# Patient Record
Sex: Female | Born: 1950 | Race: White | Hispanic: No | Marital: Married | Smoking: Never smoker
Health system: Southern US, Community
[De-identification: ages and names within clinical notes are randomized; demographics above are authoritative.]

## PROBLEM LIST (undated history)

## (undated) DIAGNOSIS — N814 Uterovaginal prolapse, unspecified: Secondary | ICD-10-CM

## (undated) DIAGNOSIS — M1990 Unspecified osteoarthritis, unspecified site: Secondary | ICD-10-CM

## (undated) DIAGNOSIS — Z973 Presence of spectacles and contact lenses: Secondary | ICD-10-CM

## (undated) DIAGNOSIS — R413 Other amnesia: Secondary | ICD-10-CM

## (undated) DIAGNOSIS — N393 Stress incontinence (female) (male): Secondary | ICD-10-CM

## (undated) DIAGNOSIS — I10 Essential (primary) hypertension: Secondary | ICD-10-CM

## (undated) DIAGNOSIS — I8392 Asymptomatic varicose veins of left lower extremity: Secondary | ICD-10-CM

## (undated) DIAGNOSIS — E039 Hypothyroidism, unspecified: Secondary | ICD-10-CM

## (undated) DIAGNOSIS — E785 Hyperlipidemia, unspecified: Secondary | ICD-10-CM

## (undated) DIAGNOSIS — R42 Dizziness and giddiness: Secondary | ICD-10-CM

## (undated) DIAGNOSIS — G4730 Sleep apnea, unspecified: Secondary | ICD-10-CM

## (undated) HISTORY — DX: Hyperlipidemia, unspecified: E78.5

## (undated) HISTORY — PX: VARICOSE VEIN SURGERY: SHX832

## (undated) HISTORY — PX: CARPAL TUNNEL RELEASE: SHX101

## (undated) HISTORY — PX: LACRIMAL DUCT PROBING W/ DACRYOPLASTY: SHX1904

## (undated) HISTORY — PX: EYE SURGERY: SHX253

## (undated) HISTORY — PX: ABDOMINAL HYSTERECTOMY: SHX81

## (undated) HISTORY — DX: Other amnesia: R41.3

## (undated) HISTORY — DX: Asymptomatic varicose veins of left lower extremity: I83.92

## (undated) HISTORY — PX: THYROIDECTOMY: SHX17

---

## 1999-01-31 HISTORY — PX: CHOLECYSTECTOMY: SHX55

## 2004-01-14 ENCOUNTER — Ambulatory Visit: Payer: Self-pay | Admitting: Orthopaedic Surgery

## 2004-05-05 ENCOUNTER — Ambulatory Visit: Payer: Self-pay | Admitting: Ophthalmology

## 2004-07-07 ENCOUNTER — Ambulatory Visit: Payer: Self-pay

## 2004-12-11 ENCOUNTER — Other Ambulatory Visit: Payer: Self-pay

## 2004-12-11 ENCOUNTER — Emergency Department: Payer: Self-pay | Admitting: Emergency Medicine

## 2005-12-13 ENCOUNTER — Ambulatory Visit: Payer: Self-pay | Admitting: Orthopaedic Surgery

## 2006-08-22 ENCOUNTER — Emergency Department: Payer: Self-pay | Admitting: Emergency Medicine

## 2006-10-05 ENCOUNTER — Ambulatory Visit: Payer: Self-pay | Admitting: Unknown Physician Specialty

## 2006-11-28 ENCOUNTER — Ambulatory Visit: Payer: Self-pay

## 2007-07-02 ENCOUNTER — Emergency Department: Payer: Self-pay | Admitting: Emergency Medicine

## 2007-10-02 ENCOUNTER — Ambulatory Visit: Payer: Self-pay | Admitting: Internal Medicine

## 2007-10-08 ENCOUNTER — Ambulatory Visit: Payer: Self-pay

## 2007-10-31 ENCOUNTER — Ambulatory Visit: Payer: Self-pay | Admitting: Internal Medicine

## 2007-11-08 ENCOUNTER — Ambulatory Visit: Payer: Self-pay | Admitting: General Practice

## 2007-11-20 ENCOUNTER — Ambulatory Visit: Payer: Self-pay | Admitting: General Practice

## 2008-08-02 ENCOUNTER — Emergency Department: Payer: Self-pay | Admitting: Emergency Medicine

## 2008-11-26 ENCOUNTER — Other Ambulatory Visit: Payer: Self-pay | Admitting: Otolaryngology

## 2008-12-15 ENCOUNTER — Ambulatory Visit: Payer: Self-pay | Admitting: Otolaryngology

## 2009-01-18 ENCOUNTER — Ambulatory Visit: Payer: Self-pay | Admitting: Otolaryngology

## 2009-04-05 ENCOUNTER — Ambulatory Visit: Payer: Self-pay | Admitting: Obstetrics and Gynecology

## 2009-10-21 ENCOUNTER — Emergency Department: Payer: Self-pay | Admitting: Emergency Medicine

## 2010-07-01 ENCOUNTER — Other Ambulatory Visit: Payer: Self-pay | Admitting: Obstetrics and Gynecology

## 2010-07-01 ENCOUNTER — Other Ambulatory Visit: Payer: Self-pay | Admitting: Otolaryngology

## 2010-07-04 ENCOUNTER — Ambulatory Visit: Payer: Self-pay | Admitting: Obstetrics and Gynecology

## 2010-11-14 ENCOUNTER — Ambulatory Visit: Payer: Self-pay

## 2011-04-24 ENCOUNTER — Ambulatory Visit: Payer: Self-pay | Admitting: Unknown Physician Specialty

## 2011-04-25 LAB — PATHOLOGY REPORT

## 2011-08-14 ENCOUNTER — Ambulatory Visit: Payer: Self-pay | Admitting: Obstetrics and Gynecology

## 2013-12-11 DIAGNOSIS — E039 Hypothyroidism, unspecified: Secondary | ICD-10-CM | POA: Insufficient documentation

## 2013-12-11 DIAGNOSIS — E7800 Pure hypercholesterolemia, unspecified: Secondary | ICD-10-CM | POA: Insufficient documentation

## 2013-12-11 DIAGNOSIS — I10 Essential (primary) hypertension: Secondary | ICD-10-CM | POA: Insufficient documentation

## 2013-12-11 DIAGNOSIS — Z8659 Personal history of other mental and behavioral disorders: Secondary | ICD-10-CM | POA: Insufficient documentation

## 2013-12-11 DIAGNOSIS — G4730 Sleep apnea, unspecified: Secondary | ICD-10-CM | POA: Insufficient documentation

## 2013-12-11 DIAGNOSIS — T7840XA Allergy, unspecified, initial encounter: Secondary | ICD-10-CM | POA: Insufficient documentation

## 2014-05-08 ENCOUNTER — Encounter: Payer: Self-pay | Admitting: *Deleted

## 2014-07-23 ENCOUNTER — Other Ambulatory Visit: Payer: Self-pay | Admitting: Orthopedic Surgery

## 2014-07-23 DIAGNOSIS — M25511 Pain in right shoulder: Secondary | ICD-10-CM

## 2014-07-29 ENCOUNTER — Ambulatory Visit: Payer: 59

## 2014-08-05 ENCOUNTER — Ambulatory Visit
Admission: RE | Admit: 2014-08-05 | Discharge: 2014-08-05 | Disposition: A | Discharge status: Home / Self Care | Payer: 59 | Source: Ambulatory Visit | Attending: Orthopedic Surgery | Admitting: Orthopedic Surgery

## 2014-08-05 ENCOUNTER — Ambulatory Visit: Payer: Self-pay

## 2014-08-05 DIAGNOSIS — M66821 Spontaneous rupture of other tendons, right upper arm: Secondary | ICD-10-CM | POA: Insufficient documentation

## 2014-08-05 DIAGNOSIS — M778 Other enthesopathies, not elsewhere classified: Secondary | ICD-10-CM | POA: Diagnosis not present

## 2014-08-05 DIAGNOSIS — M25511 Pain in right shoulder: Secondary | ICD-10-CM | POA: Diagnosis present

## 2014-08-05 MED ORDER — IOHEXOL 300 MG/ML  SOLN: 50.0000 mL | Freq: Once | INTRAMUSCULAR | Status: AC | PRN

## 2014-08-05 MED ORDER — GADOBENATE DIMEGLUMINE 529 MG/ML IV SOLN: 10.0000 mL | Freq: Once | INTRAVENOUS | Status: AC | PRN

## 2014-08-25 ENCOUNTER — Encounter
Admission: RE | Admit: 2014-08-25 | Discharge: 2014-08-25 | Disposition: A | Discharge status: Home / Self Care | Payer: 59 | Source: Ambulatory Visit | Attending: Orthopedic Surgery | Admitting: Orthopedic Surgery

## 2014-08-25 DIAGNOSIS — Z01812 Encounter for preprocedural laboratory examination: Secondary | ICD-10-CM | POA: Diagnosis present

## 2014-08-25 DIAGNOSIS — Z01810 Encounter for preprocedural cardiovascular examination: Secondary | ICD-10-CM | POA: Insufficient documentation

## 2014-08-25 HISTORY — DX: Hypothyroidism, unspecified: E03.9

## 2014-08-25 HISTORY — DX: Dizziness and giddiness: R42

## 2014-08-25 HISTORY — DX: Unspecified osteoarthritis, unspecified site: M19.90

## 2014-08-25 HISTORY — DX: Sleep apnea, unspecified: G47.30

## 2014-08-25 HISTORY — DX: Essential (primary) hypertension: I10

## 2014-08-25 LAB — URINALYSIS COMPLETE WITH MICROSCOPIC (ARMC ONLY)
BILIRUBIN URINE: NEGATIVE
Bacteria, UA: NONE SEEN
Glucose, UA: NEGATIVE mg/dL
HGB URINE DIPSTICK: NEGATIVE
Leukocytes, UA: NEGATIVE
Nitrite: NEGATIVE
Protein, ur: NEGATIVE mg/dL
SPECIFIC GRAVITY, URINE: 1.021 (ref 1.005–1.030)
pH: 5 (ref 5.0–8.0)

## 2014-08-25 LAB — BASIC METABOLIC PANEL
Anion gap: 10 (ref 5–15)
BUN: 21 mg/dL — ABNORMAL HIGH (ref 6–20)
CALCIUM: 9.4 mg/dL (ref 8.9–10.3)
CO2: 28 mmol/L (ref 22–32)
Chloride: 99 mmol/L — ABNORMAL LOW (ref 101–111)
Creatinine, Ser: 0.85 mg/dL (ref 0.44–1.00)
GFR calc non Af Amer: >60 mL/min (ref >60)
GLUCOSE: 100 mg/dL — AB (ref 65–99)
Potassium: 3.8 mmol/L (ref 3.5–5.1)
Sodium: 137 mmol/L (ref 135–145)

## 2014-08-25 LAB — CBC
HCT: 35.4 % (ref 35.0–47.0)
HEMOGLOBIN: 12.1 g/dL (ref 12.0–16.0)
MCH: 32.1 pg (ref 26.0–34.0)
MCHC: 34.2 g/dL (ref 32.0–36.0)
MCV: 93.6 fL (ref 80.0–100.0)
PLATELETS: 257 10*3/uL (ref 150–440)
RBC: 3.78 MIL/uL — ABNORMAL LOW (ref 3.80–5.20)
RDW: 13.7 % (ref 11.5–14.5)
WBC: 7.7 10*3/uL (ref 3.6–11.0)

## 2014-08-25 LAB — PROTIME-INR
INR: 0.96
PROTHROMBIN TIME: 13 s (ref 11.4–15.0)

## 2014-08-25 LAB — APTT: aPTT: 29 seconds (ref 24–36)

## 2014-08-25 NOTE — Pre-Procedure Instructions (Signed)
EKG sent to anesthesia for review

## 2014-08-25 NOTE — Patient Instructions (Signed)
  Your procedure is scheduled on: September 03, 2014 (Thursday) Report to Day Surgery. To find out your arrival time please call [phone] between 1PM - 3PM on September 02 2014 (Wednesday).  Remember: Instructions that are not followed completely may result in serious medical risk, up to and including death, or upon the discretion of your surgeon and anesthesiologist your surgery may need to be rescheduled.    __x__ 1. Do not eat food or drink liquids after midnight. No gum chewing or hard candies.     __x__ 2. No Alcohol for 24 hours before or after surgery.   ____ 3. Bring all medications with you on the day of surgery if instructed.    __x__ 4. Notify your doctor if there is any change in your medical condition     (cold, fever, infections).     Do not wear jewelry, make-up, hairpins, clips or nail polish.  Do not wear lotions, powders, or perfumes. You may wear deodorant.  Do not shave 48 hours prior to surgery. Men may shave face and neck.  Do not bring valuables to the hospital.    [facility] is not responsible for any belongings or valuables.               Contacts, dentures or bridgework may not be worn into surgery.  Leave your suitcase in the car. After surgery it may be brought to your room.  For patients admitted to the hospital, discharge time is determined by your                treatment team.   Patients discharged the day of surgery will not be allowed to drive home.   Please read over the following fact sheets that you were given:   Surgical Site Infection Prevention   ____ Take these medicines the morning of surgery with A SIP OF WATER:    1. Synthroid  2.   3.   4.  5.  6.  ____ Fleet Enema (as directed)   __x__ Use CHG Soap as directed  ____ Use inhalers on the day of surgery  ____ Stop metformin 2 days prior to surgery    ____ Take 1/2 of usual insulin dose the night before surgery and none on the morning of surgery.   ____ Stop  Coumadin/Plavix/aspirin on   __x__ Stop Anti-inflammatories on (Stop Meloxicam 7-10 days prior to surgery)   ____ Stop supplements until after surgery.    __x__ Bring C-Pap to the hospital.

## 2014-08-26 NOTE — Pre-Procedure Instructions (Signed)
EKG reviewed by Dr. Marcello Moores and "ok for surgery"

## 2014-09-03 ENCOUNTER — Ambulatory Visit: Payer: 59 | Admitting: Anesthesiology

## 2014-09-03 ENCOUNTER — Ambulatory Visit
Admission: RE | Admit: 2014-09-03 | Discharge: 2014-09-03 | Disposition: A | Discharge status: Home / Self Care | Payer: 59 | Source: Ambulatory Visit | Attending: Orthopedic Surgery | Admitting: Orthopedic Surgery

## 2014-09-03 ENCOUNTER — Encounter: Payer: Self-pay | Admitting: *Deleted

## 2014-09-03 ENCOUNTER — Encounter
Admission: RE | Disposition: A | Discharge status: Home / Self Care | Payer: Self-pay | Source: Ambulatory Visit | Attending: Orthopedic Surgery

## 2014-09-03 DIAGNOSIS — M75111 Incomplete rotator cuff tear or rupture of right shoulder, not specified as traumatic: Secondary | ICD-10-CM | POA: Diagnosis present

## 2014-09-03 DIAGNOSIS — I10 Essential (primary) hypertension: Secondary | ICD-10-CM | POA: Insufficient documentation

## 2014-09-03 DIAGNOSIS — G4730 Sleep apnea, unspecified: Secondary | ICD-10-CM | POA: Insufficient documentation

## 2014-09-03 DIAGNOSIS — E039 Hypothyroidism, unspecified: Secondary | ICD-10-CM | POA: Diagnosis not present

## 2014-09-03 HISTORY — PX: SHOULDER ARTHROSCOPY WITH OPEN ROTATOR CUFF REPAIR: SHX6092

## 2014-09-03 SURGERY — ARTHROSCOPY, SHOULDER WITH REPAIR, ROTATOR CUFF, OPEN
Anesthesia: General | Laterality: Right

## 2014-09-03 MED ORDER — ALUM & MAG HYDROXIDE-SIMETH 200-200-20 MG/5ML PO SUSP: 30.0000 mL | ORAL | Status: DC | PRN

## 2014-09-03 MED ORDER — DOCUSATE SODIUM 100 MG PO CAPS: 100.0000 mg | ORAL_CAPSULE | Freq: Two times a day (BID) | ORAL | Status: DC

## 2014-09-03 MED ORDER — FAMOTIDINE 20 MG PO TABS
ORAL_TABLET | ORAL | Status: AC
  Administered 2014-09-03: 20 mg via ORAL
  Filled 2014-09-03: qty 1

## 2014-09-03 MED ORDER — OXYCODONE HCL 5 MG PO TABS: 5.0000 mg | ORAL_TABLET | ORAL | Status: DC | PRN

## 2014-09-03 MED ORDER — DEXAMETHASONE SODIUM PHOSPHATE 10 MG/ML IJ SOLN
INTRAMUSCULAR | Status: DC | PRN
  Administered 2014-09-03: 10 mg via INTRAVENOUS

## 2014-09-03 MED ORDER — BUPIVACAINE HCL (PF) 0.25 % IJ SOLN
INTRAMUSCULAR | Status: AC
  Filled 2014-09-03: qty 30

## 2014-09-03 MED ORDER — MAGNESIUM CITRATE PO SOLN: 1.0000 | Freq: Once | ORAL | Status: DC | PRN

## 2014-09-03 MED ORDER — HYDROMORPHONE HCL 1 MG/ML IJ SOLN: 0.2500 mg | INTRAMUSCULAR | Status: DC | PRN

## 2014-09-03 MED ORDER — OXYCODONE HCL 5 MG PO TABS
5.0000 mg | ORAL_TABLET | ORAL | Status: DC | PRN
Start: 2014-09-03 — End: 2015-12-09

## 2014-09-03 MED ORDER — FENTANYL CITRATE (PF) 100 MCG/2ML IJ SOLN: 25.0000 ug | INTRAMUSCULAR | Status: DC | PRN

## 2014-09-03 MED ORDER — ACETAMINOPHEN 10 MG/ML IV SOLN
INTRAVENOUS | Status: AC
  Filled 2014-09-03: qty 100

## 2014-09-03 MED ORDER — POTASSIUM CHLORIDE IN NACL 20-0.45 MEQ/L-% IV SOLN: INTRAVENOUS | Status: DC

## 2014-09-03 MED ORDER — LIDOCAINE HCL (CARDIAC) 20 MG/ML IV SOLN
INTRAVENOUS | Status: DC | PRN
  Administered 2014-09-03: 80 mg via INTRAVENOUS

## 2014-09-03 MED ORDER — CEFAZOLIN SODIUM-DEXTROSE 2-3 GM-% IV SOLR
INTRAVENOUS | Status: AC
  Filled 2014-09-03: qty 50

## 2014-09-03 MED ORDER — ACETAMINOPHEN 325 MG PO TABS: 650.0000 mg | ORAL_TABLET | Freq: Four times a day (QID) | ORAL | Status: DC | PRN

## 2014-09-03 MED ORDER — ROCURONIUM BROMIDE 100 MG/10ML IV SOLN
INTRAVENOUS | Status: DC | PRN
  Administered 2014-09-03: 30 mg via INTRAVENOUS

## 2014-09-03 MED ORDER — LACTATED RINGERS IV SOLN
INTRAVENOUS | Status: DC
  Administered 2014-09-03: 75 mL/h via INTRAVENOUS

## 2014-09-03 MED ORDER — ONDANSETRON HCL 4 MG/2ML IJ SOLN
4.0000 mg | Freq: Once | INTRAMUSCULAR | Status: AC | PRN
  Administered 2014-09-03: 4 mg via INTRAVENOUS

## 2014-09-03 MED ORDER — LIDOCAINE HCL (PF) 1 % IJ SOLN
INTRAMUSCULAR | Status: AC
  Filled 2014-09-03: qty 30

## 2014-09-03 MED ORDER — ACETAMINOPHEN 10 MG/ML IV SOLN
INTRAVENOUS | Status: DC | PRN
  Administered 2014-09-03: 1000 mg via INTRAVENOUS

## 2014-09-03 MED ORDER — EPINEPHRINE HCL 1 MG/ML IJ SOLN
INTRAMUSCULAR | Status: AC
  Filled 2014-09-03: qty 1

## 2014-09-03 MED ORDER — NEOMYCIN-POLYMYXIN B GU 40-200000 IR SOLN
Status: AC
  Filled 2014-09-03: qty 20

## 2014-09-03 MED ORDER — ONDANSETRON HCL 4 MG PO TABS: 4.0000 mg | ORAL_TABLET | Freq: Four times a day (QID) | ORAL | Status: DC | PRN

## 2014-09-03 MED ORDER — EPHEDRINE SULFATE 50 MG/ML IJ SOLN
INTRAMUSCULAR | Status: DC | PRN
  Administered 2014-09-03: 10 mg via INTRAVENOUS

## 2014-09-03 MED ORDER — FAMOTIDINE 20 MG PO TABS
20.0000 mg | ORAL_TABLET | Freq: Once | ORAL | Status: AC
  Administered 2014-09-03: 20 mg via ORAL

## 2014-09-03 MED ORDER — PROMETHAZINE HCL 12.5 MG PO TABS: 12.5000 mg | ORAL_TABLET | ORAL | Status: DC | PRN

## 2014-09-03 MED ORDER — MIDAZOLAM HCL 5 MG/5ML IJ SOLN
INTRAMUSCULAR | Status: DC | PRN
  Administered 2014-09-03: 2 mg via INTRAVENOUS

## 2014-09-03 MED ORDER — CEFAZOLIN SODIUM-DEXTROSE 2-3 GM-% IV SOLR
2.0000 g | Freq: Once | INTRAVENOUS | Status: AC
  Administered 2014-09-03: 2 g via INTRAVENOUS

## 2014-09-03 MED ORDER — PROPOFOL 10 MG/ML IV BOLUS
INTRAVENOUS | Status: DC | PRN
  Administered 2014-09-03: 150 mg via INTRAVENOUS

## 2014-09-03 MED ORDER — ONDANSETRON HCL 4 MG/2ML IJ SOLN
INTRAMUSCULAR | Status: AC
  Filled 2014-09-03: qty 2

## 2014-09-03 MED ORDER — EPINEPHRINE HCL 1 MG/ML IJ SOLN
INTRAMUSCULAR | Status: DC | PRN
  Administered 2014-09-03: 8 mL

## 2014-09-03 MED ORDER — DIPHENHYDRAMINE HCL 12.5 MG/5ML PO ELIX: 12.5000 mg | ORAL_SOLUTION | ORAL | Status: DC | PRN

## 2014-09-03 MED ORDER — FENTANYL CITRATE (PF) 250 MCG/5ML IJ SOLN
INTRAMUSCULAR | Status: DC | PRN
  Administered 2014-09-03 (×4): 50 ug via INTRAVENOUS

## 2014-09-03 MED ORDER — ACETAMINOPHEN 650 MG RE SUPP: 650.0000 mg | Freq: Four times a day (QID) | RECTAL | Status: DC | PRN

## 2014-09-03 MED ORDER — ONDANSETRON HCL 4 MG/2ML IJ SOLN
INTRAMUSCULAR | Status: DC | PRN
  Administered 2014-09-03: 4 mg via INTRAVENOUS

## 2014-09-03 MED ORDER — ONDANSETRON HCL 4 MG/2ML IJ SOLN: 4.0000 mg | Freq: Four times a day (QID) | INTRAMUSCULAR | Status: DC | PRN

## 2014-09-03 MED ORDER — SUGAMMADEX SODIUM 500 MG/5ML IV SOLN
INTRAVENOUS | Status: DC | PRN
  Administered 2014-09-03: 156 mg via INTRAVENOUS

## 2014-09-03 MED ORDER — FENTANYL CITRATE (PF) 250 MCG/5ML IJ SOLN: INTRAMUSCULAR | Status: DC | PRN

## 2014-09-03 MED ORDER — ROPIVACAINE HCL 2 MG/ML IJ SOLN
INTRAMUSCULAR | Status: AC
  Filled 2014-09-03: qty 40

## 2014-09-03 SURGICAL SUPPLY — 63 items
ADAPTER IRRIG TUBE 2 SPIKE SOL (ADAPTER) ×6 IMPLANT
ANCHOR DBLROW ROT CUFF 2.8 MAG (Anchor) ×3 IMPLANT
BUR RADIUS 4.0X18.5 (BURR) ×3 IMPLANT
BUR RADIUS 5.5 (BURR) ×3 IMPLANT
CANNULA 5.75X7 CRYSTAL CLEAR (CANNULA) ×3 IMPLANT
CANNULA PARTIAL THREAD 2X7 (CANNULA) IMPLANT
CANNULA TWIST IN 8.25X9CM (CANNULA) IMPLANT
CLOSURE WOUND 1/2 X4 (GAUZE/BANDAGES/DRESSINGS) ×1
CONNECTOR M SMARTSTITCH (Connector) ×3 IMPLANT
COOLER POLAR GLACIER W/PUMP (MISCELLANEOUS) ×3 IMPLANT
DRAPE IMP U-DRAPE 54X76 (DRAPES) ×6 IMPLANT
DRAPE INCISE IOBAN 66X45 STRL (DRAPES) ×3 IMPLANT
DRAPE U-SHAPE 47X51 STRL (DRAPES) ×3 IMPLANT
DURAPREP 26ML APPLICATOR (WOUND CARE) ×9 IMPLANT
GAUZE PETRO XEROFOAM 1X8 (MISCELLANEOUS) ×3 IMPLANT
GAUZE SPONGE 4X4 12PLY STRL (GAUZE/BANDAGES/DRESSINGS) ×6 IMPLANT
GLOVE BIOGEL PI IND STRL 9 (GLOVE) ×1 IMPLANT
GLOVE BIOGEL PI INDICATOR 9 (GLOVE) ×2
GLOVE SURG 9.0 ORTHO LTXF (GLOVE) ×6 IMPLANT
GOWN STRL REUS TWL 2XL XL LVL4 (GOWN DISPOSABLE) ×3 IMPLANT
GOWN STRL REUS W/ TWL LRG LVL3 (GOWN DISPOSABLE) ×1 IMPLANT
GOWN STRL REUS W/ TWL LRG LVL4 (GOWN DISPOSABLE) ×1 IMPLANT
GOWN STRL REUS W/TWL LRG LVL3 (GOWN DISPOSABLE) ×2
GOWN STRL REUS W/TWL LRG LVL4 (GOWN DISPOSABLE) ×2
IV LACTATED RINGER IRRG 3000ML (IV SOLUTION) ×12
IV LR IRRIG 3000ML ARTHROMATIC (IV SOLUTION) ×6 IMPLANT
KIT RM TURNOVER STRD PROC AR (KITS) ×3 IMPLANT
KIT STABILIZATION SHOULDER (MISCELLANEOUS) ×3 IMPLANT
MANIFOLD NEPTUNE II (INSTRUMENTS) ×3 IMPLANT
MASK FACE SPIDER DISP (MASK) ×3 IMPLANT
MAT BLUE FLOOR 46X72 FLO (MISCELLANEOUS) ×6 IMPLANT
NDL SAFETY 18GX1.5 (NEEDLE) ×3 IMPLANT
NDL SAFETY 22GX1.5 (NEEDLE) ×3 IMPLANT
NS IRRIG 500ML POUR BTL (IV SOLUTION) ×3 IMPLANT
PACK ARTHROSCOPY SHOULDER (MISCELLANEOUS) ×3 IMPLANT
PAD GROUND ADULT SPLIT (MISCELLANEOUS) ×3 IMPLANT
PAD WRAPON POLAR SHDR UNIV (MISCELLANEOUS) ×1 IMPLANT
PASSER SUT CAPTURE FIRST (SUTURE) ×3 IMPLANT
SET TUBE SUCT SHAVER OUTFL 24K (TUBING) ×3 IMPLANT
SET TUBE TIP INTRA-ARTICULAR (MISCELLANEOUS) ×3 IMPLANT
SMARTSTITCH M-CONNECTOR IMPLANT
STRIP CLOSURE SKIN 1/2X4 (GAUZE/BANDAGES/DRESSINGS) ×2 IMPLANT
SUT CO BRAID (SUTURE) ×6 IMPLANT
SUT ETHILON 4-0 (SUTURE)
SUT ETHILON 4-0 FS2 18XMFL BLK (SUTURE)
SUT KNTLS 2.8 MAGNUM (Anchor) ×9 IMPLANT
SUT MNCRL 4-0 (SUTURE) ×2
SUT MNCRL 4-0 27XMFL (SUTURE) ×1
SUT PDS AB 0 CT1 27 (SUTURE) ×6 IMPLANT
SUT VIC AB 0 CT1 36 (SUTURE) ×6 IMPLANT
SUT VIC AB 2-0 CT2 27 (SUTURE) ×3 IMPLANT
SUTURE ETHLN 4-0 FS2 18XMF BLK (SUTURE) IMPLANT
SUTURE MNCRL 4-0 27XMF (SUTURE) ×1 IMPLANT
SUTURE OPUS MAGNUM SZ 2 WHT (SUTURE) ×2 IMPLANT
SYRINGE 10CC LL (SYRINGE) ×3 IMPLANT
Smarthstitch Magnumwire ×3 IMPLANT
TAPE MICROFOAM 4IN (TAPE) ×3 IMPLANT
TUBING ARTHRO INFLOW-ONLY STRL (TUBING) ×3 IMPLANT
TUBING CONNECTING 10 (TUBING) ×2 IMPLANT
TUBING CONNECTING 10' (TUBING) ×1
WAND HAND CNTRL MULTIVAC 90 (MISCELLANEOUS) ×3 IMPLANT
WRAPON POLAR PAD SHDR UNIV (MISCELLANEOUS) ×3
smartstitch magnumwire ×6 IMPLANT

## 2014-09-03 NOTE — Op Note (Signed)
09/03/2014  10:52 AM  PATIENT:  Gailen Shelter  64 y.o. female  PRE-OPERATIVE DIAGNOSIS:  FULL THICKNESS rotator cuff tear with possible partial versus full thickness biceps tendon tear, right shoulder  POST-OPERATIVE DIAGNOSIS:  HIGH GRADE PARTIALTHICKNESS ROTATOR CUFF TEAR INVOLVING THE SUPRASPINATUS TENDON, SPONTANEOUS FULL THICKNESS TEAR OF LONG HEAD OF THE BICEPS TENDON  PROCEDURE:  Procedure(s): SHOULDER ARTHROSCOPY WITH OPEN ROTATOR CUFF REPAIR (Right), and arthroscopic subacromial decompression   SURGEON:  Surgeon(s) and Role:    * Thornton Park, MD - Primary  ANESTHESIA:   Gen. endotracheal intubation with right interscalene block. Local anesthesia was provided with 0.25% Marcaine plain   PREOPERATIVE INDICATIONS:  YARIELYS BEED is a  64 y.o. female with a diagnosis of RIGHT SHOULDERFULL THICKNESS rotator cuff tear who failed conservative measures and elected for surgical management.    The risks benefits and alternatives were discussed with the patient preoperatively including but not limited to the risks of infection, bleeding, nerve injury, persistent pain or weakness, failure of the hardware, re-tear of the rotator cuff and the need for further surgery. Medical risks include DVT and pulmonary embolism, myocardial infarction, stroke, pneumonia, respiratory failure and death. Patient understood these risks and wished to proceed.  OPERATIVE IMPLANTS: ArthroCare Magnum 2 anchors 3, ArthroCare Magnum M anchor 1  OPERATIVE FINDINGS: High-grade partial-thickness tear involving the supraspinatus, spontaneous full-thickness tear of the long head of the biceps tendon which had retracted into the upper arm, mild fraying of the superior aspect of the subscapularis without full-thickness tear, and glenohumeral joint arthrosis   OPERATIVE PROCEDURE: The patient was met in the preoperative area. The right shoulder was signed with the word yes and my initials according the hospital's  correct site of surgery protocol. The patient underwent placement of an interscalene block by the anesthesia service.  Patient was brought to the operating room where they underwent general endotracheal intubation. They were placed in a beachchair position.  A spider arm positioner was used for this case. Examination under anesthesia revealed no limitation of motion or instability with load shift testing. The patient had a negative sulcus sign.  Patient was prepped and draped in a sterile fashion. A timeout was performed to verify the patient's name, date of birth, medical record number, correct site of surgery and correct procedure to be performed there was also used to verify the patient received antibiotics that all appropriate instruments, implants and radiographs studies were available in the room. Once all in attendance were in agreement case began.  Bony landmarks were drawn out with a surgical marker along with proposed arthroscopy incisions. These were pre-injected with 0.25% Marcaine plain. An 11 blade was used to establish a posterior portal through which the arthroscope was placed in the glenohumeral joint. A full diagnostic examination of the shoulder was performed.    The arthroscope was then placed in the subacromial space. Extensive bursitis was encountered and debrided using a 4-0 resector shaver blade and a 90 ArthroCare wand from a lateral portal which was established under direct visualization using an 18-gauge spinal needle. A subacromial decompression was also performed using a 5.5 mm resector shaver blade from the lateral portal. A single Smart stitch was placed in the lateral border of the rotator cuff tear.  A 5.5 mm resector shaver blade was then used to burr the greater tuberosity removing all torn fibers of the rotator cuff. All arthroscopic instruments were then removed and the mini-open portion of the procedure began.   A saber-type incision  was made along the lateral border  of the acromion. The deltoid muscle was identified and split in line with its fibers which allowed visualization of the rotator cuff. The Smart stitches previously placed in the lateral border of the rotator cuff were brought out through the deltoid split.   A single Magnum M anchor was placed at the articular margin of the humeral head with the greater tuberosity. The 4 suture limbs of the Magnum M anchor were passed medially through the rotator cuff using a first pass suture passer.  2 additional Smart stitches were placed in the lateral border of the rotator cuff tear. The Smart stitches were then anchored laterally to the humeral head using Magnum 2 anchors. These anchors were tensioned to allow for anatomic reduction of the rotator cuff to the greater tuberosity footprint.  Images of the repair were taken with the arthroscope both externally and from inside the glenohumeral joint.  All incisions were copiously irrigated. The deltoid fascia was repaired using a 0 Vicryl suture.  The subcutaneous tissue of all incisions were closed with a 2-0 Vicryl. Skin closure for the arthroscopic incisions was performed with 4-0 nylon. The skin edges of the saber incision was approximated with a running 4-0 undyed Monocryl.  A dry sterile dressing was applied.  The patient was placed in an abduction sling, with a Polar Caresleeve, and TENS unit leads.  All sharp and instrument counts were correct at the conclusion of the case. I was scrubbed and present for the entire case. I spoke with the patient's family postoperatively to let them know the case had gone without complication and the patient was stable in recovery room.    Timoteo Gaul, MD

## 2014-09-03 NOTE — Discharge Instructions (Signed)
Wear sling at all times, including sleep.  You will need to use the sling for a total of 4 weeks following surgery.  Do not try and lift your arm away from your body for any reason.   Keep the dressing dry.  You may remove bandage in 3 days.  Leave the Steri-Strips (white medical tape) in place.  You may place additional Band-Aids over top of the Steri-Strips if you wish.  May shower once dressing is removed in 3 days.  Remove sling carefully only for showers, leaving arm down by your side while in the shower.  Make sure to take some pain medication this evening before you fall asleep, in preparation for the nerve block wearing off in the middle of the night.  If the the pain medication causes itching, or is too strong, try taking a single tablet at a time, or combining with Benadryl.  You may be most comfortable sleeping in a recliner.  If you do sleep in near bed, placed pillows behind the shoulder that have the operation to support it.

## 2014-09-03 NOTE — Transfer of Care (Signed)
Immediate Anesthesia Transfer of Care Note  Patient: Jordan Palmer  Procedure(s) Performed: Procedure(s): SHOULDER ARTHROSCOPY WITH OPEN ROTATOR CUFF REPAIR (Right)  Patient Location: PACU  Anesthesia Type:General  Level of Consciousness: sedated  Airway & Oxygen Therapy: Patient Spontanous Breathing and Patient connected to face mask oxygen  Post-op Assessment: Report given to RN and Post -op Vital signs reviewed and stable  Post vital signs: Reviewed and stable  Last Vitals:  Filed Vitals:   09/03/14 1043  BP: 146/70  Pulse: 84  Temp: 36.6 C  Resp: 18    Complications: No apparent anesthesia complications

## 2014-09-03 NOTE — OR Nursing (Signed)
Dr. Andree Elk here to review case with patient and explained results of EKG's x 2.  Pt unaware of any documentation of prior MI.  Patient made aware and will  Continue with PCP for any follow up.

## 2014-09-03 NOTE — Anesthesia Procedure Notes (Addendum)
Anesthesia Regional Block:  Interscalene brachial plexus block  Pre-Anesthetic Checklist: ,, timeout performed, Correct Patient, Correct Site, Correct Laterality, Correct Procedure, Correct Position, site marked, Risks and benefits discussed,  Surgical consent,  Pre-op evaluation,  At surgeon's request and post-op pain management  Laterality: Right  Prep: alcohol swabs       Needles:  Injection technique: Single-shot  Needle Type: Stimulator Needle - 80     Needle Length: 5cm 5 cm Needle Gauge: 22 and 22 G  Needle insertion depth: 2 cm   Additional Needles:  Procedures: nerve stimulator Interscalene brachial plexus block  Nerve Stimulator or Paresthesia:  Response: 80 mA,   Additional Responses:   Narrative:   Performed by: Personally   Additional Notes: Pt tolerated isnb without dif.  + twitch right tricep and no pain or iv sx with injection.  vsst ja   Procedure Name: Intubation Date/Time: 09/03/2014 7:46 AM Performed by: Delaney Meigs Pre-anesthesia Checklist: Patient identified, Timeout performed, Patient being monitored, Suction available and Emergency Drugs available Patient Re-evaluated:Patient Re-evaluated prior to inductionOxygen Delivery Method: Circle system utilized Preoxygenation: Pre-oxygenation with 100% oxygen Intubation Type: IV induction Ventilation: Mask ventilation without difficulty Laryngoscope Size: McGraph and 4 Grade View: Grade III Tube type: Oral Tube size: 7.0 mm Number of attempts: 1 Airway Equipment and Method: Stylet and Patient positioned with wedge pillow Placement Confirmation: ETT inserted through vocal cords under direct vision,  positive ETCO2 and breath sounds checked- equal and bilateral Secured at: 22 cm Tube secured with: Tape Dental Injury: Teeth and Oropharynx as per pre-operative assessment

## 2014-09-03 NOTE — H&P (Addendum)
PREOPERATIVE H&P  Chief Complaint: FULL THICKNESS R.C.R  HPI: Jordan Palmer is a 64 y.o. female who presents for preoperative history and physical with a diagnosis of RIGHT SHOULDER FULL THICKNESS R.C.R. Symptoms are rated as moderate to severe, and have been worsening.  This is significantly impairing activities of daily living and her ability to perform at work. She has failed nonoperative management.  She has elected for surgical management.   Past Medical History  Diagnosis Date  . Hypertension   . Sleep apnea   . Hypothyroidism   . Arthritis   . Vertigo    Past Surgical History  Procedure Laterality Date  . Eye surgery    . Lacrimal duct probing w/ dacryoplasty Bilateral   . Cholecystectomy    . Knee arthroscopy Left   . Carpal tunnel release Bilateral   . Thyroidectomy Right     hemi-thyroidectomy   History   Social History  . Marital Status: Married    Spouse Name: N/A  . Number of Children: N/A  . Years of Education: N/A   Social History Main Topics  . Smoking status: Never Smoker   . Smokeless tobacco: Never Used  . Alcohol Use: Not on file     Comment: occasional  . Drug Use: No  . Sexual Activity: Not on file   Other Topics Concern  . None   Social History Narrative   Family History  Problem Relation Age of Onset  . Diabetes type II Father    Allergies  Allergen Reactions  . Codeine Nausea And Vomiting   Prior to Admission medications   Medication Sig Start Date End Date Taking? Authorizing Provider  acetaminophen (TYLENOL) 500 MG tablet Take 1,000 mg by mouth every 6 (six) hours as needed.   Yes Historical Provider, MD  estrogen, conjugated,-medroxyprogesterone (PREMPRO) 0.3-1.5 MG per tablet Take 1 tablet by mouth daily.   Yes Historical Provider, MD  fexofenadine (ALLEGRA) 180 MG tablet Take 180 mg by mouth daily.   Yes Historical Provider, MD  levothyroxine (SYNTHROID, LEVOTHROID) 125 MCG tablet Take 125 mcg by mouth daily before breakfast.    Yes Historical Provider, MD  lisinopril-hydrochlorothiazide (PRINZIDE,ZESTORETIC) 20-25 MG per tablet Take 1 tablet by mouth daily.   Yes Historical Provider, MD  meloxicam (MOBIC) 7.5 MG tablet Take 7.5 mg by mouth as needed for pain.    Historical Provider, MD     Positive ROS: All other systems have been reviewed and were otherwise negative with the exception of those mentioned in the HPI and as above.  Physical Exam: General: Alert, no acute distress Cardiovascular: Regular rate and rhythm, no murmurs rubs or gallops.  No pedal edema Respiratory: Clear to auscultation bilaterally, no wheezes rales or rhonchi. No cyanosis, no use of accessory musculature GI: No organomegaly, abdomen is soft and non-tender nondistended with positive bowel sounds. Skin: Skin intact, no lesions within the operative field. Neurologic: Sensation intact distally Psychiatric: Patient is competent for consent with normal mood and affect Lymphatic: No axillary or cervical lymphadenopathy  MUSCULOSKELETAL: Right shoulder: Patient has pain with abduction and forward elevation beyond 90. She has pain with direct downward directed force on her abducted shoulder. She has weakness of the supraspinatus and to a lesser extend the external rotators. She has intact sensation light touch throughout the right upper extremity with palpable radial pulse. She has positive impingement signs but no apprehension or instability. She has full motor function of the right upper extremity.  Assessment: FULL THICKNESS R.C.R, RIGHT  SHOULDER  Plan: Plan for Procedure(s): RIGHT SHOULDER ARTHROSCOPY WITH OPEN ROTATOR CUFF REPAIR  Patient has a full thickness rotator cuff tear in the right shoulder by MRI. There is retraction of the rotator cuff. I recommended surgical management for this injury given the patient's high activity level at baseline. I explained in detail rotator cuff repair surgery and the postoperative course. I answered  all the patient's questions.  I discussed the risks and benefits of surgery. The risks include but are not limited to infection, bleeding requiring blood transfusion, nerve or blood vessel injury, joint stiffness or loss of motion, persistent pain, weakness or instability, malunion, nonunion and hardware failure and the need for further surgery. Medical risks include but are not limited to DVT and pulmonary embolism, myocardial infarction, stroke, pneumonia, respiratory failure and death. Patient understood these risks and wished to proceed.   Thornton Park, MD   09/03/2014 7:26 AM

## 2014-09-03 NOTE — Anesthesia Preprocedure Evaluation (Addendum)
Anesthesia Evaluation  Patient identified by MRN, date of birth, ID band Patient awake    Reviewed: Allergy & Precautions, H&P , NPO status , Patient's Chart, lab work & pertinent test results, reviewed documented beta blocker date and time   Airway Mallampati: IV  TM Distance: >3 FB Neck ROM: full    Dental   Pulmonary sleep apnea ,          Cardiovascular Exercise Tolerance: Good hypertension, Rate:Normal  Old ekg and new compared with both showing a previous inf. Infarct.  Stable cardiac sx profile with good exercise tolerance and no anginal equivalents.  Pt. Has been seen by Dr. Gilford Rile and cleared.Marland KitchenMarland KitchenMarland KitchenWill proceed with General Anesthesia and isnb for post of pain.  R/B discussed with the patient and accepted  and she will continue follow up care with Dr. Gilford Rile regarding ekg.    Neuro/Psych    GI/Hepatic   Endo/Other  Hypothyroidism   Renal/GU      Musculoskeletal  (+) Arthritis -,   Abdominal   Peds  Hematology   Anesthesia Other Findings   Reproductive/Obstetrics                           Anesthesia Physical Anesthesia Plan  ASA: III  Anesthesia Plan: General ETT   Post-op Pain Management: MAC Combined w/ Regional for Post-op pain   Induction:   Airway Management Planned:   Additional Equipment:   Intra-op Plan:   Post-operative Plan:   Informed Consent: I have reviewed the patients History and Physical, chart, labs and discussed the procedure including the risks, benefits and alternatives for the proposed anesthesia with the patient or authorized representative who has indicated his/her understanding and acceptance.   Dental Advisory Given  Plan Discussed with: CRNA  Anesthesia Plan Comments:        Anesthesia Quick Evaluation

## 2014-09-04 ENCOUNTER — Encounter: Payer: Self-pay | Admitting: Orthopedic Surgery

## 2014-09-04 NOTE — Anesthesia Postprocedure Evaluation (Signed)
  Anesthesia Post-op Note  Patient: Jordan Palmer  Procedure(s) Performed: Procedure(s): SHOULDER ARTHROSCOPY WITH OPEN ROTATOR CUFF REPAIR (Right)  Anesthesia type:General ETT  Patient location: PACU  Post pain: Pain level controlled  Post assessment: Post-op Vital signs reviewed, Patient's Cardiovascular Status Stable, Respiratory Function Stable, Patent Airway and No signs of Nausea or vomiting  Post vital signs: Reviewed and stable  Last Vitals:  Filed Vitals:   09/03/14 1241  BP: 131/57  Pulse: 84  Temp:   Resp: 16    Level of consciousness: awake, alert  and patient cooperative  Complications: No apparent anesthesia complications

## 2014-09-14 ENCOUNTER — Encounter: Payer: Self-pay | Admitting: Physical Therapy

## 2014-09-14 ENCOUNTER — Ambulatory Visit: Payer: 59 | Attending: Orthopedic Surgery | Admitting: Physical Therapy

## 2014-09-14 ENCOUNTER — Ambulatory Visit: Payer: 59 | Admitting: Physical Therapy

## 2014-09-14 DIAGNOSIS — M25511 Pain in right shoulder: Secondary | ICD-10-CM | POA: Diagnosis present

## 2014-09-14 DIAGNOSIS — M25611 Stiffness of right shoulder, not elsewhere classified: Secondary | ICD-10-CM | POA: Diagnosis present

## 2014-09-14 DIAGNOSIS — R531 Weakness: Secondary | ICD-10-CM | POA: Diagnosis present

## 2014-09-14 NOTE — Therapy (Signed)
[facility] [address] [place], [place], [address] Phone: [phone]   Fax:  [phone]  Physical Therapy Evaluation  Patient Details  Name: Jordan Palmer MRN: [id number] Date of Birth: 08/03/1950 Referring Provider:  Thornton Park, MD  Encounter Date: 09/14/2014    Past Medical History  Diagnosis Date  . Hypertension   . Sleep apnea   . Hypothyroidism   . Arthritis   . Vertigo     Past Surgical History  Procedure Laterality Date  . Eye surgery    . Lacrimal duct probing w/ dacryoplasty Bilateral   . Cholecystectomy    . Knee arthroscopy Left   . Carpal tunnel release Bilateral   . Thyroidectomy Right     hemi-thyroidectomy  . Shoulder arthroscopy with open rotator cuff repair Right 09/03/2014    Procedure: SHOULDER ARTHROSCOPY WITH OPEN ROTATOR CUFF REPAIR;  Surgeon: Thornton Park, MD;  Location: [facility] ORS;  Service: Orthopedics;  Laterality: Right;    There were no vitals filed for this visit.  Visit Diagnosis:  Pain in joint, shoulder region, right  Stiffness of joint, shoulder region, right  Generalized weakness      Subjective Assessment - 09/14/14 1608    Subjective Pt. reports 3/10 R shoulder pain with no pain meds since this morning.  Pt. currrently wearing TENS and full sling.  Pt denies any mechanicsm of injury to cause R rotator cuff tear.    Limitations House hold activities;Other (comment)   Patient Stated Goals Increase R shoulder ROM/stablity.  Beekeeping, yard work    Pain Score 3    Pain Location Shoulder   Pain Orientation Right   Pain Descriptors / Indicators Aching;Dull            [facility] PT Assessment - 09/14/14 0001    Assessment   Medical Diagnosis R RTC repair   Onset Date/Surgical Date 09/03/14   Hand Dominance Right   Next MD Visit 10/08/14   Precautions   Precautions Shoulder   Shoulder Interventions Shoulder sling/immobilizer      OBJECTIVE:  Quick DASH: 72%  There  ex: pendulums in all directions verbal cues needed for proper body alignment instead of arm movement (handout provided for HEP). Followed up with pts. personal TENS and ice to end session. Educated patient on importance of decreasing dependency on TENS units.  Manual: Soft tissue to shoulder musculature (noted tenderness along incisions/ supraspinatus/ proximal biceps).   Incision: healing well. Steri strips beginning to come off but looks clean underneath.  No redness or warmth.        PT Education - 09/14/14 1631    Education provided Yes   Education Details provided with education on shoulder mobility, sling wear, handout on pendulums, educated on TENS and importance of icing, ball squeezing. Pt educated on scar massage and going with direction of scar not against it.    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    Baseline 72% on 8/15   Time 8   Period Weeks   Status New   PT LONG TERM GOAL #2   Title Pt will be independent with HEP to increase R shoulder AROM to [facility] as  compared to L shoulder to promote return to househould chores/ ADL.     Baseline ER: 0, Flexion: 90, IR 50 PROM    Time 8   Period Weeks   Status New   PT LONG TERM GOAL #3   Title Pt. able to manage hair/ dress with no increase in R shoulder pain/limitations.    Baseline ER 0 and pain limited    Time 8   Period Weeks   Status New   PT LONG TERM GOAL #4   Title Pt will demonstrate R shoulder muscle strength to >3/5 MMT to improve overhead mobility/ bee keeping.      Baseline TBD   Time 8   Period Weeks   Status New         Plan - 09/14/14 1621    Clinical Impression Statement Pt is a 64 y.o s/p R full thickness supraspinatus repair (DOS: 09/03/14). Pt presents with  3/10 R shoulder pain at rest and pain with end range PROM. Pt R shoulder PROM: ER: 0 deg. pain limited,  IR: 50 degrees, abduction: 90 degrees, flexion: 90 degrees. Cervical AROM is WFL. Pt able to perform pendulums with proper technique after verbal cuing and tacile cues to let R UE be lose. Pt will benefit from skilled short term PT in order to return to functional mobility and addressed decreased ROM, decreased strength and impaired joint mobility.    Pt will benefit from skilled therapeutic intervention in order to improve on the following deficits Decreased endurance;Hypomobility;Decreased activity tolerance;Decreased strength;Impaired UE functional use;Pain;Decreased mobility;Decreased range of motion;Impaired flexibility;Decreased coordination;Increased muscle spasms;Decreased scar mobility;Improper body mechanics   Rehab Potential Good   PT Frequency 2x / week   PT Duration 8 weeks   PT Treatment/Interventions ADLs/Self Care Home Management;Electrical Stimulation;Cryotherapy;Moist Heat;Therapeutic activities;Therapeutic exercise;Functional mobility training;Patient/family education;Neuromuscular re-education;Scar mobilization;Passive range of motion   PT Next Visit Plan continue with PROM stretching in tolerated range, scar mobilization   PT Home Exercise Plan scar mobilization, icing, pendulums, bicep curls with relaxed arm and no weight    Consulted and Agree with Plan of Care Patient         Problem List There are no active problems to display for this patient.  Pura Spice, PT, DPT # [phone]   09/14/2014, 5:25 PM  [place] [address] [place], [place], [address] Phone: [phone]   Fax:  [phone]

## 2014-09-16 ENCOUNTER — Encounter: Payer: Self-pay | Admitting: Physical Therapy

## 2014-09-16 ENCOUNTER — Ambulatory Visit: Payer: 59 | Admitting: Physical Therapy

## 2014-09-16 DIAGNOSIS — M25511 Pain in right shoulder: Secondary | ICD-10-CM

## 2014-09-16 DIAGNOSIS — M25611 Stiffness of right shoulder, not elsewhere classified: Secondary | ICD-10-CM

## 2014-09-16 DIAGNOSIS — R531 Weakness: Secondary | ICD-10-CM

## 2014-09-16 NOTE — Therapy (Signed)
[facility] [address] [place], [place], [address] Phone: [phone]   Fax:  [phone]  Physical Therapy Treatment  Patient Details  Name: Jordan Palmer MRN: [id number] Date of Birth: [birth year] Referring Provider:  Thornton Park, MD  Encounter Date: 09/16/2014      PT End of Session - 09/16/14 1608    Visit Number 2   Number of Visits 16   Authorization - Visit Number 2   Authorization - Number of Visits 10   PT Start Time 6734   PT Stop Time 1937   PT Time Calculation (min) 37 min   Activity Tolerance Patient tolerated treatment well;Patient limited by pain   Behavior During Therapy [facility] for tasks assessed/performed      Past Medical History  Diagnosis Date  . Hypertension   . Sleep apnea   . Hypothyroidism   . Arthritis   . Vertigo     Past Surgical History  Procedure Laterality Date  . Eye surgery    . Lacrimal duct probing w/ dacryoplasty Bilateral   . Cholecystectomy    . Knee arthroscopy Left   . Carpal tunnel release Bilateral   . Thyroidectomy Right     hemi-thyroidectomy  . Shoulder arthroscopy with open rotator cuff repair Right 09/03/2014    Procedure: SHOULDER ARTHROSCOPY WITH OPEN ROTATOR CUFF REPAIR;  Surgeon: Thornton Park, MD;  Location: [facility] ORS;  Service: Orthopedics;  Laterality: Right;    There were no vitals filed for this visit.  Visit Diagnosis:  Pain in joint, shoulder region, right  Stiffness of joint, shoulder region, right  Generalized weakness      Subjective Assessment - 09/16/14 1604    Subjective Pt reports 2/10 R shoulder pain and took 2 pain pills and wore her PolarCare for an hour. Pt currently has her sling on. Pt reports not wearing TENS last night and trying to do without it.    Limitations House hold activities;Other (comment)   Patient Stated Goals Increase R shoulder ROM/stablity.  Beekeeping, yard work    Currently in Pain? Yes   Pain Score 2    Pain  Location Shoulder   Pain Orientation Right   Pain Descriptors / Indicators Aching;Dull                                 PT Education - 09/16/14 1631    Education provided Yes   Education Details Educated on pendulum technique and scar mobility. Continue to use ball and decrease use of TENS. Pt educated to continue current HEP and add in bicep curls with relaxed arm and no weight.    Person(s) Educated Patient   Methods Explanation;Demonstration   Comprehension Verbalized understanding;Returned demonstration     OBJECTIVE: Manual: scar tissue and soft tissue mobilization with Free up, gentle pressure (some tenderness noted). PROM stretching flexion/horizontal abduction/elbow flexion in supine. Incision: healing well and clean. Mild tenderness noted. No steri-strips on. There ex: Reviewed HEP pendulums with pt for proper technique with focus on weight shift from LEs instead of UEs. Pt required cuing to relax her R arm. 30 seconds circles/countercircles/side to side/forwards and backwards. 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    Baseline 72% on 8/15   Time 8   Period Weeks   Status New   PT LONG TERM GOAL #2   Title Pt will be independent with HEP to increase R shoulder AROM to [facility] as compared to L shoulder to promote return to househould chores/ ADL.     Baseline ER: 0, Flexion: 90, IR 50 PROM    Time 8   Period Weeks   Status New   PT LONG TERM GOAL #3   Title Pt. able to manage hair/ dress with no increase in R shoulder pain/limitations.    Baseline ER 0 and pain limited    Time 8   Period Weeks   Status New   PT LONG TERM GOAL #4   Title Pt will demonstrate R shoulder muscle strength to >3/5 MMT to improve overhead  mobility/ bee keeping.      Baseline TBD   Time 8   Period Weeks   Status New               Plan - 09/16/14 1624    Clinical Impression Statement Pt continues to be limited by muscle guarding and pain with PROM. Pt has pain at rest at 2/10 and end range supine PROM. Pt still pain limited with ER past neutral and pain limited with horizontal abduction. Pt able to move her bicep without pain and continues to be compliant with HEP. Reviewed pendulum technique with pt to ensure proper form at home. Pt notes overal tenderness with scar tissue mobilization and PROM.    Pt will benefit from skilled therapeutic intervention in order to improve on the following deficits Decreased endurance;Hypomobility;Decreased activity tolerance;Decreased strength;Impaired UE functional use;Pain;Decreased mobility;Decreased range of motion;Impaired flexibility;Decreased coordination;Increased muscle spasms;Decreased scar mobility;Improper body mechanics   Rehab Potential Good   PT Frequency 2x / week   PT Duration 8 weeks   PT Treatment/Interventions ADLs/Self Care Home Management;Electrical Stimulation;Cryotherapy;Moist Heat;Therapeutic activities;Therapeutic exercise;Functional mobility training;Patient/family education;Neuromuscular re-education;Scar mobilization;Passive range of motion   PT Next Visit Plan Stretching PROM in  tolerated range, soft tissue and scar mobilization to decrease tenderness    PT Home Exercise Plan Continue current HEP    Consulted and Agree with Plan of Care Patient        Problem List There are no active problems to display for this patient.  Kerman Passey, PT, DPT    09/16/2014, 4:45 PM  [facility] [address] [place], [place], [address] Phone: [phone]   Fax:  [phone]

## 2014-09-16 NOTE — Therapy (Deleted)
[facility] [address]. [place], [place], [address] Phone: [phone]   Fax:  [phone]  Physical Therapy Treatment  Patient Details  Name: Jordan Palmer MRN: [id number] Date of Birth: 12-16-1950 Referring Provider:  Thornton Park, MD  Encounter Date: 09/16/2014      PT End of Session - 09/16/14 1608    Visit Number 2   Number of Visits 16   Authorization - Visit Number 2   Authorization - Number of Visits 10   PT Start Time 7591   PT Stop Time 6384   PT Time Calculation (min) 37 min   Activity Tolerance Patient tolerated treatment well;Patient limited by pain   Behavior During Therapy [facility] for tasks assessed/performed      Past Medical History  Diagnosis Date  . Hypertension   . Sleep apnea   . Hypothyroidism   . Arthritis   . Vertigo     Past Surgical History  Procedure Laterality Date  . Eye surgery    . Lacrimal duct probing w/ dacryoplasty Bilateral   . Cholecystectomy    . Knee arthroscopy Left   . Carpal tunnel release Bilateral   . Thyroidectomy Right     hemi-thyroidectomy  . Shoulder arthroscopy with open rotator cuff repair Right 09/03/2014    Procedure: SHOULDER ARTHROSCOPY WITH OPEN ROTATOR CUFF REPAIR;  Surgeon: Thornton Park, MD;  Location: [facility] ORS;  Service: Orthopedics;  Laterality: Right;    There were no vitals filed for this visit.  Visit Diagnosis:  Pain in joint, shoulder region, right  Stiffness of joint, shoulder region, right  Generalized weakness      Subjective Assessment - 09/16/14 1604    Subjective Pt reports 2/10 R shoulder pain and took 2 pain pills and wore her PolarCare for an hour. Pt currently has her sling on. Pt reports not wearing TENS last night and trying to do without it.    Limitations House hold activities;Other (comment)   Patient Stated Goals Increase R shoulder ROM/stablity.  Beekeeping, yard work    Currently in Pain? Yes   Pain Score 2    Pain  Location Shoulder   Pain Orientation Right   Pain Descriptors / Indicators Aching;Dull      OBJECTIVE: Manual: scar tissue and soft tissue mobilization with Free up, gentle pressure (some tenderness noted). PROM stretching flexion/horizontal abduction/elbow flexion in supine. Incision: healing well and clean. Mild tenderness noted. No steri-strips on. There ex: Reviewed HEP pendulums with pt for proper technique with focus on weight shift from LEs instead of UEs. Pt required cuing to relax her R arm. 30 seconds circles/countercircles/side to side/forwards and backwards. Active bicep curls with relaxed arm and no weight (pt performed with good technique and no c/o increased pain).      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    Baseline 72% on 8/15   Time 8   Period Weeks   Status New   PT LONG TERM GOAL #2   Title Pt will be independent with HEP to increase R shoulder AROM to [facility] as compared to L shoulder to promote return to househould chores/ ADL.     Baseline ER: 0, Flexion: 90, IR 50 PROM    Time 8   Period Weeks   Status New  PT LONG TERM GOAL #3   Title Pt. able to manage hair/ dress with no increase in R shoulder pain/limitations.    Baseline ER 0 and pain limited    Time 8   Period Weeks   Status New   PT LONG TERM GOAL #4   Title Pt will demonstrate R shoulder muscle strength to >3/5 MMT to improve overhead mobility/ bee keeping.      Baseline TBD   Time 8   Period Weeks   Status New        Problem List There are no active problems to display for this patient.  Kerman Passey, PT, DPT    09/16/2014, 4:42 PM  [facility] [address] [place], [place], [address] Phone: [phone]   Fax:  [phone]

## 2014-09-21 ENCOUNTER — Ambulatory Visit: Payer: 59 | Admitting: Physical Therapy

## 2014-09-21 DIAGNOSIS — M25611 Stiffness of right shoulder, not elsewhere classified: Secondary | ICD-10-CM

## 2014-09-21 DIAGNOSIS — M25511 Pain in right shoulder: Secondary | ICD-10-CM | POA: Diagnosis not present

## 2014-09-21 DIAGNOSIS — R531 Weakness: Secondary | ICD-10-CM

## 2014-09-21 NOTE — Therapy (Signed)
[facility] [address] [place], [place], [address] Phone: [phone]   Fax:  [phone]  Physical Therapy Treatment  Patient Details  Name: Jordan Palmer MRN: [id number] Date of Birth: February 25, 1950 Referring Provider:  Thornton Park, MD  Encounter Date: 09/21/2014      PT End of Session - 09/21/14 1726    Visit Number 3   Number of Visits 16   PT Start Time 1603   PT Stop Time 1643   PT Time Calculation (min) 40 min   Activity Tolerance Patient tolerated treatment well;Patient limited by pain   Behavior During Therapy [facility] for tasks assessed/performed      Past Medical History  Diagnosis Date  . Hypertension   . Sleep apnea   . Hypothyroidism   . Arthritis   . Vertigo     Past Surgical History  Procedure Laterality Date  . Eye surgery    . Lacrimal duct probing w/ dacryoplasty Bilateral   . Cholecystectomy    . Knee arthroscopy Left   . Carpal tunnel release Bilateral   . Thyroidectomy Right     hemi-thyroidectomy  . Shoulder arthroscopy with open rotator cuff repair Right 09/03/2014    Procedure: SHOULDER ARTHROSCOPY WITH OPEN ROTATOR CUFF REPAIR;  Surgeon: Thornton Park, MD;  Location: [facility] ORS;  Service: Orthopedics;  Laterality: Right;    There were no vitals filed for this visit.  Visit Diagnosis:  Pain in joint, shoulder region, right  Stiffness of joint, shoulder region, right  Generalized weakness      Subjective Assessment - 09/21/14 1724    Subjective Pt. reports good compliance with use of sling/ shoulder precautions at this time.  Pt. able to go into pool and perform pendulum.  Pt. states she has 11 more days until the end of 4 weeks.    Limitations House hold activities;Other (comment)   Patient Stated Goals Increase R shoulder ROM/stablity.  Beekeeping, yard work    Currently in Pain? Yes   Pain Score 2    Pain Location Shoulder   Pain Orientation Right       OBJECTIVE: Manual:  STM to R shoulder/UT region in sitting prior to PROM.  Supine PROM (all planes as tolerated) 10x each.  Seated R shoulder flexion PROM 10x (pain tolerable)- 120 deg.  Incision: healing well and clean. Mild tenderness noted. There ex:  Supine active bicep curls with relaxed arm and no weight (pt performed with good technique and no c/o increased pain).  Seated shoulder pulley PROM flexion/ abd.  (flexion only for HEP at this time).  Proper technique demonstrated with use of L UE for PROM.     Pt response for medical necessity:  Increase R shoulder PROM with good incision healing/ decrease tenderness.  Benefits from pulley for PROM to decrease muscle guarding during PT assisted PROM in supine.            PT Education - 09/21/14 1725    Education provided Yes   Education Details PROM pulley ex. (shoulder flexion only).     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    Baseline 72% on 8/15   Time  8   Period Weeks   Status New   PT LONG TERM GOAL #2   Title Pt will be independent with HEP to increase R shoulder AROM to [facility] as compared to L shoulder to promote return to househould chores/ ADL.     Baseline ER: 0, Flexion: 90, IR 50 PROM    Time 8   Period Weeks   Status New   PT LONG TERM GOAL #3   Title Pt. able to manage hair/ dress with no increase in R shoulder pain/limitations.    Baseline ER 0 and pain limited    Time 8   Period Weeks   Status New   PT LONG TERM GOAL #4   Title Pt will demonstrate R shoulder muscle strength to >3/5 MMT to improve overhead mobility/ bee keeping.      Baseline TBD   Time 8   Period Weeks   Status New            Plan - 09/21/14 1727    Clinical Impression Statement Pt. able  to relax to promote increase R shoulder PROM in supine with PT assist and seated with use of pulley and 100% L UE PROM.  No seated R shoulder abd. PROM at this time do to pain/ muscle guarding.  Great scar healing and minimal to no R UT/shoulder tenderness.    Pt will benefit from skilled therapeutic intervention in order to improve on the following deficits Decreased endurance;Hypomobility;Decreased activity tolerance;Decreased strength;Impaired UE functional use;Pain;Decreased mobility;Decreased range of motion;Impaired flexibility;Decreased coordination;Increased muscle spasms;Decreased scar mobility;Improper body mechanics   PT Frequency 2x / week   PT Duration 8 weeks   PT Treatment/Interventions ADLs/Self Care Home Management;Electrical Stimulation;Cryotherapy;Moist Heat;Therapeutic activities;Therapeutic exercise;Functional mobility training;Patient/family education;Neuromuscular re-education;Scar mobilization;Passive range of motion   PT Next Visit Plan Stretching PROM in  tolerated range, soft tissue and scar mobilization to decrease tenderness.  Reassess use of pulley and add abd.     PT Home Exercise Plan Continue current HEP         Problem List There are no active problems to display for this patient.  Pura Spice, PT, DPT # [phone]   09/21/2014, 5:36 PM  [facility] [address] [place], [place], [address] Phone: [phone]   Fax:  [phone]

## 2014-09-24 ENCOUNTER — Ambulatory Visit: Payer: 59 | Admitting: Physical Therapy

## 2014-09-24 DIAGNOSIS — R531 Weakness: Secondary | ICD-10-CM

## 2014-09-24 DIAGNOSIS — M25511 Pain in right shoulder: Secondary | ICD-10-CM | POA: Diagnosis not present

## 2014-09-24 DIAGNOSIS — M25611 Stiffness of right shoulder, not elsewhere classified: Secondary | ICD-10-CM

## 2014-09-24 NOTE — Therapy (Signed)
[facility] [address] [place], [place], [address] Phone: [phone]   Fax:  [phone]  Physical Therapy Treatment  Patient Details  Name: Jordan Palmer MRN: [id number] Date of Birth: April 07, 1950 Referring Provider:  Thornton Park, MD  Encounter Date: 09/24/2014      PT End of Session - 09/25/14 1044    Visit Number 4   Number of Visits 16   PT Start Time 4081   PT Stop Time 1629   PT Time Calculation (min) 41 min   Activity Tolerance Patient tolerated treatment well;Patient limited by pain   Behavior During Therapy [facility] for tasks assessed/performed      Past Medical History  Diagnosis Date  . Hypertension   . Sleep apnea   . Hypothyroidism   . Arthritis   . Vertigo     Past Surgical History  Procedure Laterality Date  . Eye surgery    . Lacrimal duct probing w/ dacryoplasty Bilateral   . Cholecystectomy    . Knee arthroscopy Left   . Carpal tunnel release Bilateral   . Thyroidectomy Right     hemi-thyroidectomy  . Shoulder arthroscopy with open rotator cuff repair Right 09/03/2014    Procedure: SHOULDER ARTHROSCOPY WITH OPEN ROTATOR CUFF REPAIR;  Surgeon: Thornton Park, MD;  Location: [facility] ORS;  Service: Orthopedics;  Laterality: Right;    There were no vitals filed for this visit.  Visit Diagnosis:  Pain in joint, shoulder region, right  Stiffness of joint, shoulder region, right  Generalized weakness      Subjective Assessment - 09/25/14 1042    Subjective No new complaints.  Pt. states she is doing shoulder pulley PROM flexon 2x/day and shoulder feels better after stretching.  Pt. states she is so ready to have sling off next week.    Limitations House hold activities;Other (comment)   Patient Stated Goals Increase R shoulder ROM/stablity.  Beekeeping, yard work    Currently in Pain? Yes   Pain Score 2    Pain Location Shoulder   Pain Orientation Right       OBJECTIVE:  Therex.: discussed  sh. Pulley ex. (no questions from pt.).  Supine R shoulder serratus punches 10x (no pain).  Manual tx.: Supine R shoulder PROM all planes (pain tolerable) per MD protocol.  STM to R shoulder biceps region.  Scar massage performed (2 stitches noted in R medial deltoid incision).  R shoulder AP/PA grade II-III mobs.  (good mobility).      Pt response for medical necessity:  Progressing well with pain limitations with PROM sh. Abd./ ER.  Good capsular mobility.         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    Baseline 72% on 8/15   Time 8   Period Weeks   Status New   PT LONG TERM GOAL #2   Title Pt will be independent with HEP to increase R shoulder AROM to [facility] as compared to L shoulder to promote return to househould chores/ ADL.     Baseline ER: 0, Flexion: 90, IR 50 PROM    Time 8   Period Weeks   Status New   PT LONG TERM GOAL #3   Title Pt. able to manage hair/ dress with no increase in R shoulder pain/limitations.  Baseline ER 0 and pain limited    Time 8   Period Weeks   Status New   PT LONG TERM GOAL #4   Title Pt will demonstrate R shoulder muscle strength to >3/5 MMT to improve overhead mobility/ bee keeping.      Baseline TBD   Time 8   Period Weeks   Status New               Plan - 09/25/14 1045    Clinical Impression Statement R shoulder PROM continues to show slow but consistent progress during manual tx. session/ PROM.  Pt. has 2 small stitches noted in medial deltoid incision with no redness or discomfort noted.  PT instructed pt. to leave stitches alone and if any symptoms appear to contact MD.  >130 R shoulder flexion PROM in supine position.  Pain limited with >30 deg. ER/ >90 deg. abd. PROM.    Pt will benefit from skilled therapeutic intervention in order to improve on the following deficits Decreased endurance;Hypomobility;Decreased  activity tolerance;Decreased strength;Impaired UE functional use;Pain;Decreased mobility;Decreased range of motion;Impaired flexibility;Decreased coordination;Increased muscle spasms;Decreased scar mobility;Improper body mechanics   Rehab Potential Good   PT Frequency 2x / week   PT Duration 8 weeks   PT Treatment/Interventions ADLs/Self Care Home Management;Electrical Stimulation;Cryotherapy;Moist Heat;Therapeutic activities;Therapeutic exercise;Functional mobility training;Patient/family education;Neuromuscular re-education;Scar mobilization;Passive range of motion   PT Next Visit Plan Stretching PROM in  tolerated range, soft tissue and scar mobilization to decrease tenderness.  Reassess use of pulley and add abd.     PT Home Exercise Plan Continue current HEP         Problem List There are no active problems to display for this patient.  Pura Spice, PT, DPT # [phone]   09/25/2014, 10:49 AM  [facility] [address] [place], [place], [address] Phone: [phone]   Fax:  [phone]

## 2014-09-28 ENCOUNTER — Ambulatory Visit: Payer: 59 | Admitting: Physical Therapy

## 2014-09-28 DIAGNOSIS — M25511 Pain in right shoulder: Secondary | ICD-10-CM | POA: Diagnosis not present

## 2014-09-28 DIAGNOSIS — M25611 Stiffness of right shoulder, not elsewhere classified: Secondary | ICD-10-CM

## 2014-09-28 DIAGNOSIS — R531 Weakness: Secondary | ICD-10-CM

## 2014-09-29 NOTE — Therapy (Signed)
[facility] [address] [place], [place], [address] Phone: [phone]   Fax:  [phone]  Physical Therapy Treatment  Patient Details  Name: Jordan Palmer MRN: [id number] Date of Birth: 1950-10-09 Referring Provider:  Thornton Park, MD  Encounter Date: 09/28/2014      PT End of Session - 09/29/14 0850    Visit Number 5   Number of Visits 16   Date for PT Re-Evaluation 11/09/14   PT Start Time 1611   PT Stop Time 1701   PT Time Calculation (min) 50 min   Activity Tolerance Patient tolerated treatment well;Patient limited by pain   Behavior During Therapy [facility] for tasks assessed/performed      Past Medical History  Diagnosis Date  . Hypertension   . Sleep apnea   . Hypothyroidism   . Arthritis   . Vertigo     Past Surgical History  Procedure Laterality Date  . Eye surgery    . Lacrimal duct probing w/ dacryoplasty Bilateral   . Cholecystectomy    . Knee arthroscopy Left   . Carpal tunnel release Bilateral   . Thyroidectomy Right     hemi-thyroidectomy  . Shoulder arthroscopy with open rotator cuff repair Right 09/03/2014    Procedure: SHOULDER ARTHROSCOPY WITH OPEN ROTATOR CUFF REPAIR;  Surgeon: Thornton Park, MD;  Location: [facility] ORS;  Service: Orthopedics;  Laterality: Right;    There were no vitals filed for this visit.  Visit Diagnosis:  Pain in joint, shoulder region, right  Stiffness of joint, shoulder region, right  Generalized weakness      Subjective Assessment - 09/29/14 0845    Subjective Pt. reports good compliance with R shoulder HEP/ use of sling until this Thursday.  Pt. able to ambulate without use of sling and consistent recip. arm swing with no increase c/o pain.     Limitations House hold activities;Other (comment)   Patient Stated Goals Increase R shoulder ROM/stablity.  Beekeeping, yard work    Currently in Pain? Yes   Pain Score 2    Pain Location Shoulder   Pain Orientation  Right   Pain Descriptors / Indicators Aching;Dull       OBJECTIVE: Therex.: Shoulder pulley flexion/ abd. Warm-up (issued abd. To HEP). Supine R shoulder serratus punches 10x (no pain).  Discussed AAROM wand ex. And will issue next tx.  Manual tx.: Supine R shoulder PROM all planes (pain tolerable) per MD protocol. STM to R shoulder biceps region. Scar massage performed to R medial deltoid incisions. R shoulder AP/PA/inf. grade II-III mobs.(good mobility all planes).   Pt response for medical necessity: Progressing well with pain limitations with PROM sh. Abd./ ER. Good capsular mobility.          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    Baseline 72% on 8/15   Time 8   Period Weeks   Status New   PT LONG TERM GOAL #2   Title Pt will be independent with HEP to increase R shoulder AROM to [facility] as compared to L shoulder to promote return to househould chores/ ADL.     Baseline ER: 0, Flexion: 90, IR 50 PROM    Time 8   Period Weeks   Status New   PT LONG TERM GOAL #3   Title  Pt. able to manage hair/ dress with no increase in R shoulder pain/limitations.    Baseline ER 0 and pain limited    Time 8   Period Weeks   Status New   PT LONG TERM GOAL #4   Title Pt will demonstrate R shoulder muscle strength to >3/5 MMT to improve overhead mobility/ bee keeping.      Baseline TBD   Time 8   Period Weeks   Status New            Plan - 09/29/14 1000    Clinical Impression Statement Pt. continues to progress with all aspects of R shoulder PROM per MD protocol.  Increase R posterior capsule mobility and pt. reinstructed to continue with scar/STM to R deltoid musculature.  Pt. will benefit from addition of AAROM with wand next tx. and weaning off of sling next. tx.     Pt will benefit from skilled therapeutic intervention in order to improve on  the following deficits Decreased endurance;Hypomobility;Decreased activity tolerance;Decreased strength;Impaired UE functional use;Pain;Decreased mobility;Decreased range of motion;Impaired flexibility;Decreased coordination;Increased muscle spasms;Decreased scar mobility;Improper body mechanics   Rehab Potential Good   PT Frequency 2x / week   PT Duration 8 weeks   PT Treatment/Interventions ADLs/Self Care Home Management;Electrical Stimulation;Cryotherapy;Moist Heat;Therapeutic activities;Therapeutic exercise;Functional mobility training;Patient/family education;Neuromuscular re-education;Scar mobilization;Passive range of motion   PT Next Visit Plan Stretching PROM in  tolerated range, soft tissue and scar mobilization to decrease tenderness.  Add AAROM ex. program.     PT Home Exercise Plan Continue current HEP         Problem List There are no active problems to display for this patient.  Pura Spice, PT, DPT # [phone]   09/29/2014, 10:36 AM  [facility] [address] [place], [place], [address] Phone: [phone]   Fax:  [phone]

## 2014-09-30 ENCOUNTER — Ambulatory Visit: Payer: 59 | Admitting: Physical Therapy

## 2014-09-30 DIAGNOSIS — M25611 Stiffness of right shoulder, not elsewhere classified: Secondary | ICD-10-CM

## 2014-09-30 DIAGNOSIS — R531 Weakness: Secondary | ICD-10-CM

## 2014-09-30 DIAGNOSIS — M25511 Pain in right shoulder: Secondary | ICD-10-CM | POA: Diagnosis not present

## 2014-09-30 NOTE — Therapy (Signed)
[facility] [address] [place], [place], [address] Phone: [phone]   Fax:  [phone]  Physical Therapy Treatment  Patient Details  Name: Jordan Palmer MRN: [id number] Date of Birth: 1950/03/16 Referring Provider:  Thornton Park, MD  Encounter Date: 09/30/2014      PT End of Session - 09/30/14 1848    Visit Number 6   Number of Visits 16   Date for PT Re-Evaluation 11/09/14   PT Start Time 5427   PT Stop Time 1713   PT Time Calculation (min) 42 min   Activity Tolerance Patient tolerated treatment well;Patient limited by pain   Behavior During Therapy [facility] for tasks assessed/performed      Past Medical History  Diagnosis Date  . Hypertension   . Sleep apnea   . Hypothyroidism   . Arthritis   . Vertigo     Past Surgical History  Procedure Laterality Date  . Eye surgery    . Lacrimal duct probing w/ dacryoplasty Bilateral   . Cholecystectomy    . Knee arthroscopy Left   . Carpal tunnel release Bilateral   . Thyroidectomy Right     hemi-thyroidectomy  . Shoulder arthroscopy with open rotator cuff repair Right 09/03/2014    Procedure: SHOULDER ARTHROSCOPY WITH OPEN ROTATOR CUFF REPAIR;  Surgeon: Thornton Park, MD;  Location: [facility] OR[facility];  Service: Orthopedics;  Laterality: Right;    There were no vitals filed for this visit.  Visit Diagnosis:  Pain in joint, shoulder region, right  Stiffness of joint, shoulder region, right  Generalized weakness      Subjective Assessment - 09/30/14 1847    Subjective Pt. states she is doing well and states she is ready to get rid of sling.  Pt. instructed by MD to wean off of sling at 4 week mark (tomorrow).     Limitations House hold activities;Other (comment)   Patient Stated Goals Increase R shoulder ROM/stablity.  Beekeeping, yard work    Currently in Pain? Yes   Pain Score 2    Pain Location Shoulder   Pain Orientation Right   Pain Descriptors / Indicators  Aching;Dull        OBJECTIVE: Therex.:  Standing wall ladder 10x sh. Flexion (marked with sticker)- see new HEP. Supine R shoulder serratus punches 10x (no pain). Manual tx.: Supine R shoulder PROM all planes (pain tolerable) per MD protocol. STM to R shoulder biceps region. Scar massage performed to R medial deltoid incisions. R shoulder AP/PA/inf. grade II-III mobs.(good mobility all planes).   Pt response for medical necessity: Progressing well with pain limitations with PROM sh. Abd./ ER. Good capsular mobility.          PT Education - 09/30/14 1851    Education provided Yes   Education Details See handouts (wand sh. press-ups/ flexion/ ER/ wall walking with R UE).   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    Baseline 72% on 8/15   Time 8   Period Weeks   Status New   PT LONG TERM GOAL #2   Title Pt will be independent with HEP to increase R shoulder AROM to [facility] as  compared to L shoulder to promote return to househould chores/ ADL.     Baseline ER: 0, Flexion: 90, IR 50 PROM    Time 8   Period Weeks   Status New   PT LONG TERM GOAL #3   Title Pt. able to manage hair/ dress with no increase in R shoulder pain/limitations.    Baseline ER 0 and pain limited    Time 8   Period Weeks   Status New   PT LONG TERM GOAL #4   Title Pt will demonstrate R shoulder muscle strength to >3/5 MMT to improve overhead mobility/ bee keeping.      Baseline TBD   Time 8   Period Weeks   Status New               Plan - 09/30/14 1849    Clinical Impression Statement Progressing well to [facility] with use of wand in a pain tolerable R shoulder range (supine position).  Pt. remains limited with R  sh. ER/abd.  Decrease R deltoid tenderness with palpation.     Pt will benefit from skilled therapeutic intervention in order to improve on the following deficits Decreased endurance;Hypomobility;Decreased activity tolerance;Decreased strength;Impaired UE functional use;Pain;Decreased mobility;Decreased range of motion;Impaired flexibility;Decreased coordination;Increased muscle spasms;Decreased scar mobility;Improper body mechanics   Rehab Potential Good   PT Frequency 2x / week   PT Duration 8 weeks   PT Treatment/Interventions ADLs/Self Care Home Management;Electrical Stimulation;Cryotherapy;Moist Heat;Therapeutic activities;Therapeutic exercise;Functional mobility training;Patient/family education;Neuromuscular re-education;Scar mobilization;Passive range of motion   PT Next Visit Plan Stretching PROM in  tolerated range, soft tissue and scar mobilization to decrease tenderness.  Reassess AAROM ex. program.     PT Home Exercise Plan Continue current HEP    Consulted and Agree with Plan of Care Patient        Problem List There are no active problems to display for this patient.  Pura Spice, PT, DPT # [id number]   09/30/2014, 6:52 PM   [facility] [address] [place], [place], [address] Phone: [phone]   Fax:  [phone]

## 2014-10-01 ENCOUNTER — Encounter: Payer: 59 | Admitting: Physical Therapy

## 2014-10-07 ENCOUNTER — Ambulatory Visit: Payer: 59 | Attending: Orthopedic Surgery | Admitting: Physical Therapy

## 2014-10-07 ENCOUNTER — Encounter: Payer: Self-pay | Admitting: Physical Therapy

## 2014-10-07 DIAGNOSIS — R531 Weakness: Secondary | ICD-10-CM | POA: Insufficient documentation

## 2014-10-07 DIAGNOSIS — M25611 Stiffness of right shoulder, not elsewhere classified: Secondary | ICD-10-CM | POA: Diagnosis present

## 2014-10-07 DIAGNOSIS — M25511 Pain in right shoulder: Secondary | ICD-10-CM | POA: Insufficient documentation

## 2014-10-07 NOTE — Therapy (Signed)
[facility] [address] [place], [place], [address] Phone: [phone]   Fax:  [phone]  Physical Therapy Treatment  Patient Details  Name: Jordan Palmer MRN: [id number] Date of Birth: February 06, 1950 Referring Provider:  Thornton Park, MD  Encounter Date: 10/07/2014      PT End of Session - 10/07/14 1757    Visit Number 7   Number of Visits 16   Date for PT Re-Evaluation 11/09/14   PT Start Time 1551   PT Stop Time 1639   PT Time Calculation (min) 48 min   Activity Tolerance Patient tolerated treatment well;Patient limited by pain   Behavior During Therapy [facility] for tasks assessed/performed      Past Medical History  Diagnosis Date  . Hypertension   . Sleep apnea   . Hypothyroidism   . Arthritis   . Vertigo     Past Surgical History  Procedure Laterality Date  . Eye surgery    . Lacrimal duct probing w/ dacryoplasty Bilateral   . Cholecystectomy    . Knee arthroscopy Left   . Carpal tunnel release Bilateral   . Thyroidectomy Right     hemi-thyroidectomy  . Shoulder arthroscopy with open rotator cuff repair Right 09/03/2014    Procedure: SHOULDER ARTHROSCOPY WITH OPEN ROTATOR CUFF REPAIR;  Surgeon: Thornton Park, MD;  Location: [facility] ORS;  Service: Orthopedics;  Laterality: Right;    There were no vitals filed for this visit.  Visit Diagnosis:  Pain in joint, shoulder region, right  Stiffness of joint, shoulder region, right  Generalized weakness      Subjective Assessment - 10/07/14 1755    Subjective Pt states she is feeling well and has no pain at rest. With end range PROM, pt reports pain at a 3/10. Pt states she is using the sling for sleeping and being out in crowds. Pt states good compliance with HEP at this time.     Limitations House hold activities;Other (comment)   Patient Stated Goals Increase R shoulder ROM/stablity.  Beekeeping, yard work    Currently in Pain? Yes   Pain Score 3    Pain  Location Shoulder   Pain Orientation Right   Pain Descriptors / Indicators Discomfort;Tightness   Pain Type Surgical pain   Pain Onset More than a month ago   Pain Frequency Intermittent       OBJECTIVE: Therex.: Supine B shoulder AAROM wand exercises: flexion/chest press 10 x 2 each direction (PT guidance). L sidelying R shoulder abduction and ER as tolerated against gravity with no weight 10 x 2 each (min verbal and tactile cues to keep pt from rotating her whole body). Standing ladder climb x 5 in flexion. Attempted abduction but pt unable to get arm up to wall ladder without increased discomfort. UBE x 3 mins with L UE doing all the work to encourage R sh. capsular motion.  Manual tx.: Supine R shoulder AA/PROM all planes (pain tolerable) per MD protocol. STM to R shoulder biceps region.  Pt response for medical necessity: Progressing well with pain limitations with PROM sh. Abd./ ER. Good capsular mobility.         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    Baseline 72% on 8/15   Time 8   Period Weeks   Status  New   PT LONG TERM GOAL #2   Title Pt will be independent with HEP to increase R shoulder AROM to [facility] as compared to L shoulder to promote return to househould chores/ ADL.     Baseline ER: 0, Flexion: 90, IR 50 PROM    Time 8   Period Weeks   Status New   PT LONG TERM GOAL #3   Title Pt. able to manage hair/ dress with no increase in R shoulder pain/limitations.    Baseline ER 0 and pain limited    Time 8   Period Weeks   Status New   PT LONG TERM GOAL #4   Title Pt will demonstrate R shoulder muscle strength to >3/5 MMT to improve overhead mobility/ bee keeping.      Baseline TBD   Time 8   Period Weeks   Status New           Plan - 10/07/14 1757    Clinical Impression Statement Pt continues to progress with R shoulder  AAROM in supine position but remains pain limited with R sh. flexion/ abd..  Pt is limited by a pain reported in the biceps with ER at neutral and abduction. Pt is progressing to AAROM activities in standing and sidelying AROM exercises as tolerated and instructed within her protocol. Pt continues to be cautious when R UE out of sling during walking/ household tasks.      Pt will benefit from skilled therapeutic intervention in order to improve on the following deficits Decreased endurance;Hypomobility;Decreased activity tolerance;Decreased strength;Impaired UE functional use;Pain;Decreased mobility;Decreased range of motion;Impaired flexibility;Decreased coordination;Increased muscle spasms;Decreased scar mobility;Improper body mechanics   Rehab Potential Good   PT Frequency 2x / week   PT Duration 8 weeks   PT Treatment/Interventions ADLs/Self Care Home Management;Electrical Stimulation;Cryotherapy;Moist Heat;Therapeutic activities;Therapeutic exercise;Functional mobility training;Patient/family education;Neuromuscular re-education;Scar mobilization;Passive range of motion   PT Next Visit Plan AA/PROM stretching/table top activities/issuing sidelying exercises for HEP (see MD protocol).   Update goals next visit.     PT Home Exercise Plan Continue current HEP    Consulted and Agree with Plan of Care Patient        Problem List There are no active problems to display for this patient.  Lavone Neri, SPT  10/07/2014, 6:16 PM  [facility] [address] [place], [place], [address] Phone: [phone]   Fax:  [phone]

## 2014-10-09 ENCOUNTER — Encounter: Payer: Self-pay | Admitting: Physical Therapy

## 2014-10-09 ENCOUNTER — Ambulatory Visit: Payer: 59 | Admitting: Physical Therapy

## 2014-10-09 DIAGNOSIS — M25511 Pain in right shoulder: Secondary | ICD-10-CM | POA: Diagnosis not present

## 2014-10-09 DIAGNOSIS — M25611 Stiffness of right shoulder, not elsewhere classified: Secondary | ICD-10-CM

## 2014-10-09 DIAGNOSIS — R531 Weakness: Secondary | ICD-10-CM

## 2014-10-09 NOTE — Therapy (Signed)
[facility] [address] [place], [place], [address] Phone: [phone]   Fax:  [phone]  Physical Therapy Treatment  Patient Details  Name: Jordan Palmer MRN: [id number] Date of Birth: 05/08/1950 Referring Provider:  Thornton Park, MD  Encounter Date: 10/09/2014      PT End of Session - 10/09/14 1646    Visit Number 8   Number of Visits 16   Date for PT Re-Evaluation 11/09/14   PT Start Time 7035   PT Stop Time 1638   PT Time Calculation (min) 46 min   Activity Tolerance Patient tolerated treatment well;Patient limited by pain   Behavior During Therapy [facility] for tasks assessed/performed      Past Medical History  Diagnosis Date  . Hypertension   . Sleep apnea   . Hypothyroidism   . Arthritis   . Vertigo     Past Surgical History  Procedure Laterality Date  . Eye surgery    . Lacrimal duct probing w/ dacryoplasty Bilateral   . Cholecystectomy    . Knee arthroscopy Left   . Carpal tunnel release Bilateral   . Thyroidectomy Right     hemi-thyroidectomy  . Shoulder arthroscopy with open rotator cuff repair Right 09/03/2014    Procedure: SHOULDER ARTHROSCOPY WITH OPEN ROTATOR CUFF REPAIR;  Surgeon: Thornton Park, MD;  Location: [facility] ORS;  Service: Orthopedics;  Laterality: Right;    There were no vitals filed for this visit.  Visit Diagnosis:  Pain in joint, shoulder region, right  Stiffness of joint, shoulder region, right  Generalized weakness      Subjective Assessment - 10/09/14 1643    Subjective Pt reports no pain at rest and pain at a 3/10 with movement. Pt states she took pain medication before arriving to PT tx session. Pt reports no new problems and continued compliance with her HEP.  Pt. states MD appt. with Dr. Mack Guise went well with no issues reported.     Patient is accompained by: Family member   Limitations House hold activities;Other (comment)   Patient Stated Goals Increase R shoulder  ROM/stablity.  Beekeeping, yard work    Currently in Pain? Yes   Pain Score 3    Pain Location Shoulder   Pain Orientation Right   Pain Type Surgical pain   Pain Onset More than a month ago   Pain Frequency Intermittent         OBJECTIVE: Therex.: UBE x 4 mins F/B with L UE doing all the work to encourage R sh. capsular motion. Standing B shoulder AAROM wand exercises: flexion/chest press/abduction/IR/ER/extension 10 each direction (PT guidance and tactile cues to decrease UT compensation on R side; c/o pain with abduction)- mirror feedback.  Table top AAROM in standing with table at shoulder height in all 4 planes (min/mod tactile and verbal cues to decrease R UT compensation) Manual tx.: Supine R shoulder AA/PROM all planes (pain tolerable) per MD protocol.  Ice to R shoulder in sitting after tx. Session.  AAROM in supine with wand: Flexion 138 deg Abduction 90 deg (pain limited) ER 35 deg (pain limited) IR 80 deg  Pt response for medical necessity: Progressing well but pain limited AAROM shoulder abduction/IR. Capsular mobility is maintaining good mobility.            PT Education - 10/09/14 1645    Education provided Yes   Education Details Pt was provided standing wand exercises (see handout above). Pt educated on importance of abduction and  external rotation motions. Pt instructed to keep active with pulleys over the weekend to help abduction motion.    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    Baseline 72% on 8/15   Time 8   Period Weeks   Status On-going   PT LONG TERM GOAL #2   Title Pt will be independent with HEP to increase R  shoulder AROM to [facility] as compared to L shoulder to promote return to househould chores/ ADL.     Baseline AAROM in supine with wand: ER 35 deg, Flexion: 138 deg, Abudction 90 deg, IR: 80 deg   Time 8   Period Weeks   Status On-going   PT LONG TERM GOAL #3   Title Pt. able to manage hair/ dress with no increase in R shoulder pain/limitations.    Baseline ER 35 deg and pain limited    Time 8   Period Weeks   Status On-going   PT LONG TERM GOAL #4   Title Pt will demonstrate R shoulder muscle strength to >3/5 MMT to improve overhead mobility/ bee keeping.      Baseline TBD   Time 8   Period Weeks   Status Not Met               Plan - 10/09/14 1647    Clinical Impression Statement Pt continues to be limited in ER and abduction with PROM and AAROM in supine due to pain. Pt is limited in standing AAROM abduction due to pain. Pt continues to report muscle soreness and appropriate referral patterns with stretching, discomfort subsides when stretching is over. Pt was educated on AAROM for this week and progression per MD protocol.  AAROM in supine with wand: flexion 138 deg, ER 35 deg (pain limited), abduction 90 deg (pain limited), IR 80 deg.    Pt will benefit from skilled therapeutic intervention in order to improve on the following deficits Decreased endurance;Hypomobility;Decreased activity tolerance;Decreased strength;Impaired UE functional use;Pain;Decreased mobility;Decreased range of motion;Impaired flexibility;Decreased coordination;Increased muscle spasms;Decreased scar mobility;Improper body mechanics   Rehab Potential Good   PT Frequency 2x / week   PT Duration 8 weeks   PT Treatment/Interventions ADLs/Self Care Home Management;Electrical Stimulation;Cryotherapy;Moist Heat;Therapeutic activities;Therapeutic exercise;Functional mobility training;Patient/family education;Neuromuscular re-education;Scar mobilization;Passive range of motion   PT Next Visit Plan sidelying  exercises/AROM (per MD protocol).    PT Home Exercise Plan Continue current HEP    Consulted and Agree with Plan of Care Patient        Problem List There are no active problems to display for this patient.   Lavone Neri, SPT  10/09/2014, 4:55 PM   [facility] [address] [place], [place], [address] Phone: [phone]   Fax:  [phone]

## 2014-10-12 ENCOUNTER — Ambulatory Visit: Payer: 59 | Admitting: Physical Therapy

## 2014-10-12 DIAGNOSIS — R531 Weakness: Secondary | ICD-10-CM

## 2014-10-12 DIAGNOSIS — M25611 Stiffness of right shoulder, not elsewhere classified: Secondary | ICD-10-CM

## 2014-10-12 DIAGNOSIS — M25511 Pain in right shoulder: Secondary | ICD-10-CM | POA: Diagnosis not present

## 2014-10-12 NOTE — Therapy (Signed)
[facility] [address] [place], [place], [address] Phone: [phone]   Fax:  [phone]  Physical Therapy Treatment  Patient Details  Name: Jordan Palmer MRN: [id number] Date of Birth: 1950-06-22 Referring Provider:  Thornton Park, MD  Encounter Date: 10/12/2014      PT End of Session - 10/12/14 1632    Visit Number 9   Number of Visits 16   Date for PT Re-Evaluation 11/09/14   PT Start Time 4580   PT Stop Time 1540   PT Time Calculation (min) 52 min   Activity Tolerance Patient tolerated treatment well;Patient limited by pain   Behavior During Therapy [facility] for tasks assessed/performed      Past Medical History  Diagnosis Date  . Hypertension   . Sleep apnea   . Hypothyroidism   . Arthritis   . Vertigo     Past Surgical History  Procedure Laterality Date  . Eye surgery    . Lacrimal duct probing w/ dacryoplasty Bilateral   . Cholecystectomy    . Knee arthroscopy Left   . Carpal tunnel release Bilateral   . Thyroidectomy Right     hemi-thyroidectomy  . Shoulder arthroscopy with open rotator cuff repair Right 09/03/2014    Procedure: SHOULDER ARTHROSCOPY WITH OPEN ROTATOR CUFF REPAIR;  Surgeon: Thornton Park, MD;  Location: [facility] ORS;  Service: Orthopedics;  Laterality: Right;    There were no vitals filed for this visit.  Visit Diagnosis:  Pain in joint, shoulder region, right  Stiffness of joint, shoulder region, right  Generalized weakness      Subjective Assessment - 10/12/14 1630    Subjective Pt reports no pain at rest and pain at a 2/10 with movement. Pt states that she has not taken anything but Advil since Saturday and doing fine. No new complaints. Pt reports progressing with HEP.   Limitations House hold activities;Other (comment)   Patient Stated Goals Increase R shoulder ROM/stablity.  Beekeeping, yard work    Currently in Pain? Yes   Pain Score 2    Pain Location Shoulder   Pain  Orientation Right   Pain Descriptors / Indicators Aching;Discomfort   Pain Onset More than a month ago       OBJECTIVE: Therex.: UBE x 4 mins F/B with L UE doing all the work to encourage R sh. capsular motion. Standing wall ladder climbs: abduction/flexion with PT guidance at elbow x 5 each direction. Standing AAROM with yellow therapy ball: flexion/IR/ER/abduction x 15 each (PT guidance at elbow and assistance; pt reported minimal discomfort with abduction). Seated: AAROM with PT facilitation in flexion/abduction/ER/IR x 10 each.  Manual tx.: Supine R shoulder AA/PROM all planes (pain tolerable) per MD protocol.  Pt response for medical necessity:Pain limited AAROM/PROM in abduction and ER otherwise motion progressing well per protocol.          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    Baseline 72% on 8/15   Time 8   Period Weeks   Status On-going   PT LONG TERM GOAL #2   Title Pt will be independent with HEP to increase R shoulder AROM to [facility] as compared to L shoulder to promote return to househould chores/ ADL.     Baseline AAROM in supine with wand: ER 35 deg,  Flexion: 138 deg, Abudction 90 deg, IR: 80 deg   Time 8   Period Weeks   Status On-going   PT LONG TERM GOAL #3   Title Pt. able to manage hair/ dress with no increase in R shoulder pain/limitations.    Baseline ER 35 deg and pain limited    Time 8   Period Weeks   Status On-going   PT LONG TERM GOAL #4   Title Pt will demonstrate R shoulder muscle strength to >3/5 MMT to improve overhead mobility/ bee keeping.      Baseline TBD   Time 8   Period Weeks   Status Not Met            Plan - 10/12/14 1632    Clinical Impression Statement Pt's AAROM is progressing well with shoulder flexion/ ER and abd. remains pain limited.  Pt was able to perform AAROM in sitting and standing today  instead of supine. Pt continues to have difficulty with abduction and ER at above 45 degrees. Pt demonstrates increased PROM with stretching today as compared to last visit.    Pt will benefit from skilled therapeutic intervention in order to improve on the following deficits Decreased endurance;Hypomobility;Decreased activity tolerance;Decreased strength;Impaired UE functional use;Pain;Decreased mobility;Decreased range of motion;Impaired flexibility;Decreased coordination;Increased muscle spasms;Decreased scar mobility;Improper body mechanics   Rehab Potential Good   PT Frequency 2x / week   PT Duration 8 weeks   PT Treatment/Interventions ADLs/Self Care Home Management;Electrical Stimulation;Cryotherapy;Moist Heat;Therapeutic activities;Therapeutic exercise;Functional mobility training;Patient/family education;Neuromuscular re-education;Scar mobilization;Passive range of motion   PT Next Visit Plan sidelying exercises/AROM (per MD protocol). New HEP/abduction exercises.    PT Home Exercise Plan Continue current HEP    Consulted and Agree with Plan of Care Patient        Problem List There are no active problems to display for this patient.   Pura Spice, SPT 10/13/2014, 8:44 AM  [facility] [address] [place], [place], [address] Phone: [phone]   Fax:  [phone]

## 2014-10-13 ENCOUNTER — Encounter: Payer: Self-pay | Admitting: Physical Therapy

## 2014-10-15 ENCOUNTER — Encounter: Payer: Self-pay | Admitting: Physical Therapy

## 2014-10-15 ENCOUNTER — Ambulatory Visit: Payer: 59 | Admitting: Physical Therapy

## 2014-10-15 DIAGNOSIS — R531 Weakness: Secondary | ICD-10-CM

## 2014-10-15 DIAGNOSIS — M25611 Stiffness of right shoulder, not elsewhere classified: Secondary | ICD-10-CM

## 2014-10-15 DIAGNOSIS — M25511 Pain in right shoulder: Secondary | ICD-10-CM | POA: Diagnosis not present

## 2014-10-15 NOTE — Therapy (Signed)
[facility] [address] [place], [place], [address] Phone: [phone]   Fax:  [phone]  Physical Therapy Treatment  Patient Details  Name: Jordan Palmer MRN: [id number] Date of Birth: 11-Nov-1950 Referring Provider:  Thornton Park, MD  Encounter Date: 10/15/2014      PT End of Session - 10/15/14 1709    Visit Number 10   Number of Visits 16   Date for PT Re-Evaluation 11/09/14   PT Start Time 6962   PT Stop Time 1545   PT Time Calculation (min) 51 min   Activity Tolerance Patient tolerated treatment well;Patient limited by pain   Behavior During Therapy [facility] for tasks assessed/performed      Past Medical History  Diagnosis Date  . Hypertension   . Sleep apnea   . Hypothyroidism   . Arthritis   . Vertigo     Past Surgical History  Procedure Laterality Date  . Eye surgery    . Lacrimal duct probing w/ dacryoplasty Bilateral   . Cholecystectomy    . Knee arthroscopy Left   . Carpal tunnel release Bilateral   . Thyroidectomy Right     hemi-thyroidectomy  . Shoulder arthroscopy with open rotator cuff repair Right 09/03/2014    Procedure: SHOULDER ARTHROSCOPY WITH OPEN ROTATOR CUFF REPAIR;  Surgeon: Thornton Park, MD;  Location: [facility] ORS;  Service: Orthopedics;  Laterality: Right;    There were no vitals filed for this visit.  Visit Diagnosis:  Pain in joint, shoulder region, right  Stiffness of joint, shoulder region, right  Generalized weakness      Subjective Assessment - 10/15/14 1708    Subjective Pt reports pain at rest at a 1/10 and pain with AAROM especially abduction a 4/10. Pt reports doing HEP a couple of hours prior to PT tx session.    Limitations House hold activities;Other (comment)   Patient Stated Goals Increase R shoulder ROM/stablity.  Beekeeping, yard work    Currently in Pain? Yes   Pain Score 4    Pain Location Shoulder   Pain Orientation Right   Pain Descriptors / Indicators  Aching   Pain Type Chronic pain   Pain Onset More than a month ago   Pain Frequency Intermittent          OBJECTIVE: Therex.: UBE x 5 mins F/B with L UE doing all the work to encourage R sh. capsular motion. Supine AROM with PT guidance for shoulder flexion/abduction/ER at neutral (pain limited ER and abduction). Serratus punches 15 x 2 with no weight in supine. Standing ball walks on handrail on steps in flexion and abduction (abduction pain limited).  Manual tx.: Supine R shoulder AA/PROM all planes (pain tolerable) per MD protocol.  Pt response for medical necessity:Pain limited AAROM/PROM in abduction and ER otherwise motion progressing well per protocol.          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    Baseline 72% on 8/15   Time 8   Period Weeks   Status On-going   PT LONG TERM GOAL #2   Title Pt will be independent with HEP to increase R shoulder AROM to [facility] as compared to L shoulder to promote return to househould chores/ ADL.     Baseline AAROM in supine with wand: ER 35  deg, Flexion: 138 deg, Abudction 90 deg, IR: 80 deg   Time 8   Period Weeks   Status On-going   PT LONG TERM GOAL #3   Title Pt. able to manage hair/ dress with no increase in R shoulder pain/limitations.    Baseline ER 35 deg and pain limited    Time 8   Period Weeks   Status On-going   PT LONG TERM GOAL #4   Title Pt will demonstrate R shoulder muscle strength to >3/5 MMT to improve overhead mobility/ bee keeping.      Baseline TBD   Time 8   Period Weeks   Status Not Met      Problem List There are no active problems to display for this patient.   Lavone Neri, SPT 10/15/2014, 5:13 PM   [facility] [address] [place], [place], [address] Phone: [phone]   Fax:  [phone]

## 2014-10-19 ENCOUNTER — Ambulatory Visit: Payer: 59 | Admitting: Physical Therapy

## 2014-10-19 DIAGNOSIS — R531 Weakness: Secondary | ICD-10-CM

## 2014-10-19 DIAGNOSIS — M25511 Pain in right shoulder: Secondary | ICD-10-CM

## 2014-10-19 DIAGNOSIS — M25611 Stiffness of right shoulder, not elsewhere classified: Secondary | ICD-10-CM

## 2014-10-20 ENCOUNTER — Encounter: Payer: Self-pay | Admitting: Physical Therapy

## 2014-10-20 NOTE — Therapy (Signed)
[facility] [address] [place], [place], [address] Phone: [phone]   Fax:  [phone]  Physical Therapy Treatment  Patient Details  Name: Jordan Palmer MRN: [id number] Date of Birth: [birth year] Referring Provider:  Thornton Park, MD  Encounter Date: 10/19/2014      PT End of Session - 10/20/14 1033    Visit Number 11   Number of Visits 16   Date for PT Re-Evaluation 11/09/14   PT Start Time 6962   PT Stop Time 1601   PT Time Calculation (min) 54 min   Activity Tolerance Patient tolerated treatment well;Patient limited by pain   Behavior During Therapy [facility] for tasks assessed/performed      Past Medical History  Diagnosis Date  . Hypertension   . Sleep apnea   . Hypothyroidism   . Arthritis   . Vertigo     Past Surgical History  Procedure Laterality Date  . Eye surgery    . Lacrimal duct probing w/ dacryoplasty Bilateral   . Cholecystectomy    . Knee arthroscopy Left   . Carpal tunnel release Bilateral   . Thyroidectomy Right     hemi-thyroidectomy  . Shoulder arthroscopy with open rotator cuff repair Right 09/03/2014    Procedure: SHOULDER ARTHROSCOPY WITH OPEN ROTATOR CUFF REPAIR;  Surgeon: Thornton Park, MD;  Location: [facility] ORS;  Service: Orthopedics;  Laterality: Right;    There were no vitals filed for this visit.  Visit Diagnosis:  Pain in joint, shoulder region, right  Stiffness of joint, shoulder region, right  Generalized weakness      Subjective Assessment - 10/20/14 1030    Subjective Pt. reports no issues with driving and has done well with decrease need for pain medications prior to PT.  Good compliance with HEP but remains limited with R shoulder abd.   Limitations House hold activities;Other (comment)   Patient Stated Goals Increase R shoulder ROM/stablity.  Beekeeping, yard work    Currently in Pain? Yes   Pain Score 2    Pain Location Shoulder   Pain Orientation Right   Pain  Descriptors / Indicators Aching   Pain Type Chronic pain   Pain Onset More than a month ago       OBJECTIVE: Therex.: UBE x 5 mins F/B with B UE assist (consistent cadence/ warm up and no charge). Supine to standing AROM with PT guidance for shoulder flexion/abduction/ER at neutral (pain limited ER and abduction). Supine serratus punches 15 x 2 with no weight. Standing ball walks on handrail on steps in flexion and abduction (abduction pain limited)- white ball to increase ROM, esp. With flexion.  Manual tx.: Supine R shoulder AA/PROM all planes (pain tolerable) per MD protocol. Grade II-III AP/PA mobs. At R shoulder in supine position 2x20 sec.   Pt response for medical necessity: Pain limited R shoulder AROM in standing with abduction and ER otherwise motion progressing well per protocol.  Min. Cuing for proper technique with AROM and encouragement to progress abd.           PT Education - 10/20/14 1033    Education provided Yes   Education Details Pt. instructed on R shoulder AROM in standing posture.              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    Baseline 72% on 8/15   Time 8   Period Weeks   Status On-going   PT LONG TERM GOAL #2   Title Pt will be independent with HEP to increase R shoulder AROM to [facility] as compared to L shoulder to promote return to househould chores/ ADL.     Baseline AAROM in supine with wand: ER 35 deg, Flexion: 138 deg, Abudction 90 deg, IR: 80 deg   Time 8   Period Weeks   Status On-going   PT LONG TERM GOAL #3   Title Pt. able to manage hair/ dress with no increase in R shoulder pain/limitations.    Baseline ER 35 deg and pain limited    Time 8   Period Weeks   Status On-going   PT LONG TERM GOAL #4   Title Pt will demonstrate R shoulder muscle strength to >3/5 MMT to improve overhead mobility/ bee  keeping.      Baseline TBD   Time 8   Period Weeks   Status Not Met            Plan - 10/20/14 1250    Clinical Impression Statement R shoulder pain limited with abd./ standing IR.  Pt. progressing well with seated/standing B shoulder AROM in a pain tolerable range.  PT encouraged pt. to push sh. abd./ IR with HEP (use of pulley/ AROM with wall ex.).   Minimal tenderness at proximal R shoulder with hand placement during manual tx./ AAROM.     Pt will benefit from skilled therapeutic intervention in order to improve on the following deficits Decreased endurance;Hypomobility;Decreased activity tolerance;Decreased strength;Impaired UE functional use;Pain;Decreased mobility;Decreased range of motion;Impaired flexibility;Decreased coordination;Increased muscle spasms;Decreased scar mobility;Improper body mechanics   Rehab Potential Good   PT Frequency 2x / week   PT Duration 8 weeks   PT Treatment/Interventions ADLs/Self Care Home Management;Electrical Stimulation;Cryotherapy;Moist Heat;Therapeutic activities;Therapeutic exercise;Functional mobility training;Patient/family education;Neuromuscular re-education;Scar mobilization;Passive range of motion   PT Next Visit Plan sidelying exercises/AROM (per MD protocol). New HEP/abduction exercises.    PT Home Exercise Plan Continue current HEP    Consulted and Agree with Plan of Care Patient        Problem List There are no active problems to display for this patient.  Pura Spice, PT, DPT # [phone]   10/20/2014, 1:01 PM  [facility] [address] [place], [place], [address] Phone: [phone]   Fax:  [phone]

## 2014-10-22 ENCOUNTER — Ambulatory Visit: Payer: 59 | Admitting: Physical Therapy

## 2014-10-22 ENCOUNTER — Encounter: Payer: Self-pay | Admitting: Physical Therapy

## 2014-10-22 DIAGNOSIS — R531 Weakness: Secondary | ICD-10-CM

## 2014-10-22 DIAGNOSIS — M25511 Pain in right shoulder: Secondary | ICD-10-CM

## 2014-10-22 DIAGNOSIS — M25611 Stiffness of right shoulder, not elsewhere classified: Secondary | ICD-10-CM

## 2014-10-22 NOTE — Therapy (Signed)
[facility] [address] [place], [place], [address] Phone: [phone]   Fax:  [phone]  Physical Therapy Treatment  Patient Details  Name: Jordan Palmer MRN: [id number] Date of Birth: [birth year] Referring Provider:  Thornton Park, MD  Encounter Date: 10/22/2014      PT End of Session - 10/22/14 1725    Visit Number 12   Number of Visits 16   Date for PT Re-Evaluation 11/09/14   PT Start Time 1502   PT Stop Time 1550   PT Time Calculation (min) 48 min   Activity Tolerance Patient tolerated treatment well;Patient limited by pain   Behavior During Therapy [facility] for tasks assessed/performed      Past Medical History  Diagnosis Date  . Hypertension   . Sleep apnea   . Hypothyroidism   . Arthritis   . Vertigo     Past Surgical History  Procedure Laterality Date  . Eye surgery    . Lacrimal duct probing w/ dacryoplasty Bilateral   . Cholecystectomy    . Knee arthroscopy Left   . Carpal tunnel release Bilateral   . Thyroidectomy Right     hemi-thyroidectomy  . Shoulder arthroscopy with open rotator cuff repair Right 09/03/2014    Procedure: SHOULDER ARTHROSCOPY WITH OPEN ROTATOR CUFF REPAIR;  Surgeon: Thornton Park, MD;  Location: [facility] ORS;  Service: Orthopedics;  Laterality: Right;    There were no vitals filed for this visit.  Visit Diagnosis:  Pain in joint, shoulder region, right  Stiffness of joint, shoulder region, right  Generalized weakness      Subjective Assessment - 10/22/14 1723    Subjective Pt reports no pain at rest and 3/10 pain with AROM abduction and IR.    Limitations House hold activities;Other (comment)   Patient Stated Goals Increase R shoulder ROM/stablity.  Beekeeping, yard work    Currently in Pain? Yes   Pain Score 3    Pain Location Shoulder   Pain Orientation Right   Pain Descriptors / Indicators Dull;Pressure   Pain Type Chronic pain;Surgical pain   Pain Onset More than a  month ago   Pain Frequency Intermittent          OBJECTIVE: Therex.: UBE x 6 mins F/B with B UE assist (consistent cadence/ warm up and no charge). Standing ball walks in flexion and abduction (abduction pain limited). IR with wand to assist with bringing hand behind back x 15. D1/D2 diagonal to increase combined Shoulder AROM 10 x 2. (given and HEP) Rhythmic stabilization in supine 1 min x 3. Sidelying ER/abduction with tactile cues (good scapular motion noted) 10 x 2.  (given as HEP).  Pt response for medical necessity: Pain limited R shoulder AROM in standing with abduction  progressing well per protocol. Tactile cuing for abduction in sidelying. Continue to encourage pt to increase abduction at home.               PT Education - 10/22/14 1724    Education provided Yes   Education Details Pt given sidelying horizontal abduction, ER and diagonals to add to home program. Pt encouraged to decrease wand AAROM activities and increase AROM exercises.    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    Baseline 72% on 8/15   Time 8   Period Weeks   Status On-going   PT LONG TERM GOAL #2   Title Pt will be independent with HEP to increase R shoulder AROM to [facility] as compared to L shoulder to promote return to househould chores/ ADL.     Baseline AAROM in supine with wand: ER 35 deg, Flexion: 138 deg, Abudction 90 deg, IR: 80 deg   Time 8   Period Weeks   Status On-going   PT LONG TERM GOAL #3   Title Pt. able to manage hair/ dress with no increase in R shoulder pain/limitations.    Baseline ER 35 deg and pain limited    Time 8   Period Weeks   Status On-going   PT LONG TERM GOAL  #4   Title Pt will demonstrate R shoulder muscle strength to >3/5 MMT to improve overhead mobility/ bee keeping.      Baseline TBD   Time 8   Period Weeks   Status Not Met               Plan - 10/22/14 1726    Clinical Impression Statement Pt improving in AROM in flexion, ER and IR. Pt is limited in abduction due to pain when against gravity. Pt following MD protocol for all AROM. Pt AAROM is [facility] for flexion and pain limited for abduction. Pt is able to achieve functional IR with AAROM with the wand but pain limited in AROM against gravity.    Pt will benefit from skilled therapeutic intervention in order to improve on the following deficits Decreased endurance;Hypomobility;Decreased activity tolerance;Decreased strength;Impaired UE functional use;Pain;Decreased mobility;Decreased range of motion;Impaired flexibility;Decreased coordination;Increased muscle spasms;Decreased scar mobility;Improper body mechanics   Rehab Potential Good   PT Frequency 2x / week   PT Duration 8 weeks   PT Treatment/Interventions ADLs/Self Care Home Management;Electrical Stimulation;Cryotherapy;Moist Heat;Therapeutic activities;Therapeutic exercise;Functional mobility training;Patient/family education;Neuromuscular re-education;Scar mobilization;Passive range of motion   PT Next Visit Plan sidelying exercises/AROM (per MD protocol). New HEP/abduction exercises.    PT Home Exercise Plan Continue current HEP    Consulted and Agree with Plan of Care Patient        Problem List There are no active problems to display for this patient.   Lavone Neri, [place] 10/22/2014, 5:28 PM  [facility] [address] [place], [place], [address] Phone: [phone]   Fax:  [phone]

## 2014-10-26 ENCOUNTER — Ambulatory Visit: Payer: 59 | Admitting: Physical Therapy

## 2014-10-26 DIAGNOSIS — M25511 Pain in right shoulder: Secondary | ICD-10-CM | POA: Diagnosis not present

## 2014-10-26 DIAGNOSIS — R531 Weakness: Secondary | ICD-10-CM

## 2014-10-26 DIAGNOSIS — M25611 Stiffness of right shoulder, not elsewhere classified: Secondary | ICD-10-CM

## 2014-10-26 NOTE — Therapy (Signed)
[facility] [address] [place], [place], [address] Phone: [phone]   Fax:  [phone]  Physical Therapy Treatment  Patient Details  Name: Jordan Palmer MRN: [id number] Date of Birth: 12-02-1950 Referring Provider:  Thornton Park, MD  Encounter Date: 10/26/2014      PT End of Session - 10/27/14 1122    Visit Number 13   Number of Visits 16   Date for PT Re-Evaluation 11/09/14   PT Start Time 1504   PT Stop Time 1600   PT Time Calculation (min) 56 min   Activity Tolerance Patient tolerated treatment well;Patient limited by pain   Behavior During Therapy [facility] for tasks assessed/performed      Past Medical History  Diagnosis Date  . Hypertension   . Sleep apnea   . Hypothyroidism   . Arthritis   . Vertigo     Past Surgical History  Procedure Laterality Date  . Eye surgery    . Lacrimal duct probing w/ dacryoplasty Bilateral   . Cholecystectomy    . Knee arthroscopy Left   . Carpal tunnel release Bilateral   . Thyroidectomy Right     hemi-thyroidectomy  . Shoulder arthroscopy with open rotator cuff repair Right 09/03/2014    Procedure: SHOULDER ARTHROSCOPY WITH OPEN ROTATOR CUFF REPAIR;  Surgeon: Thornton Park, MD;  Location: [facility] ORS;  Service: Orthopedics;  Laterality: Right;    There were no vitals filed for this visit.  Visit Diagnosis:  Pain in joint, shoulder region, right  Stiffness of joint, shoulder region, right  Generalized weakness      Subjective Assessment - 10/27/14 1121    Subjective Pt reports no pain at rest. Pt states she wasn't able to perform exercises this morning due to time. Pt reports pain with sidelying abduction (3/10).    Limitations House hold activities;Other (comment)   Patient Stated Goals Increase R shoulder ROM/stablity.  Beekeeping, yard work    Currently in Pain? Yes   Pain Score 3    Pain Location Shoulder   Pain Orientation Right   Pain Type Chronic pain   Pain  Onset More than a month ago   Pain Frequency Intermittent       OBJECTIVE: Manual tx.:  Supine and L sidelying R shoulder AA/PROM 10x each (all planes)- as tolerated.  STM to proximal biceps/ R posterior shoulder during supine R shoulder AROM.  Therex.: UBE x 6 mins F/B with B UE assist (consistent cadence/ warm up and no charge). Standing ball walks in flexion with bouncing (mod. Assist) provided.  Standing D1/D2 diagonal to increase combined Shoulder AROM 10 x 2 in front of mirror.  Supine R shoulder rhythmic stabilization in supine 30 sec. x 5.  L sidelying R shoulder ER/abduction with tactile cues (good scapular motion/ assist provided as needed) 10 x 2. Reviewed HEP.  Pt response for medical necessity: Pain limited R shoulder AROM in standing, esp. with abduction(marked increase noted). PT educated pt. On importance of HEP and progressing abd./ end-range flexion and ER.         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    Baseline 72% on 8/15   Time 8   Period Weeks   Status On-going   PT LONG TERM GOAL #2   Title Pt will be  independent with HEP to increase R shoulder AROM to [facility] as compared to L shoulder to promote return to househould chores/ ADL.     Baseline AAROM in supine with wand: ER 35 deg, Flexion: 138 deg, Abudction 90 deg, IR: 80 deg   Time 8   Period Weeks   Status On-going   PT LONG TERM GOAL #3   Title Pt. able to manage hair/ dress with no increase in R shoulder pain/limitations.    Baseline ER 35 deg and pain limited    Time 8   Period Weeks   Status On-going   PT LONG TERM GOAL #4   Title Pt will demonstrate R shoulder muscle strength to >3/5 MMT to improve overhead mobility/ bee keeping.      Baseline TBD   Time 8   Period Weeks   Status Not Met            Plan - 10/27/14 1122    Clinical Impression Statement Pt pain  limited with sidelying abduction but active abduction improved from last treatment session. Pt able to stabilize well against resistance with rhythmic stabilization in supine. Pt demonstrates proper technique with serratus punches and scapular stabilization. Joint capsule mobillty movement is normal.    Pt will benefit from skilled therapeutic intervention in order to improve on the following deficits Decreased endurance;Hypomobility;Decreased activity tolerance;Decreased strength;Impaired UE functional use;Pain;Decreased mobility;Decreased range of motion;Impaired flexibility;Decreased coordination;Increased muscle spasms;Decreased scar mobility;Improper body mechanics   Rehab Potential Good   PT Frequency 2x / week   PT Duration 8 weeks   PT Treatment/Interventions ADLs/Self Care Home Management;Electrical Stimulation;Cryotherapy;Moist Heat;Therapeutic activities;Therapeutic exercise;Functional mobility training;Patient/family education;Neuromuscular re-education;Scar mobilization;Passive range of motion   PT Next Visit Plan sidelying exercises/AROM (per MD protocol). Issue new HEP/abduction exercises.    PT Home Exercise Plan Continue current HEP    Consulted and Agree with Plan of Care Patient        Problem List There are no active problems to display for this patient.  Pura Spice, PT, DPT # [id number]   10/27/2014, 2:53 PM  [facility] [address] [place], [place], [address] Phone: [phone]   Fax:  [phone]

## 2014-10-27 ENCOUNTER — Encounter: Payer: Self-pay | Admitting: Physical Therapy

## 2014-10-29 ENCOUNTER — Ambulatory Visit: Payer: 59 | Admitting: Physical Therapy

## 2014-10-29 DIAGNOSIS — M25511 Pain in right shoulder: Secondary | ICD-10-CM

## 2014-10-29 DIAGNOSIS — R531 Weakness: Secondary | ICD-10-CM

## 2014-10-29 DIAGNOSIS — M25611 Stiffness of right shoulder, not elsewhere classified: Secondary | ICD-10-CM

## 2014-10-30 NOTE — Therapy (Signed)
[facility] [address] [place], [place], [address] Phone: [phone]   Fax:  [phone]  Physical Therapy Treatment  Patient Details  Name: Jordan Palmer MRN: [id number] Date of Birth: July 17, 1950 Referring Provider:  Thornton Park, MD  Encounter Date: 10/29/2014      PT End of Session - 10/30/14 1455    Visit Number 14   Number of Visits 16   Date for PT Re-Evaluation 11/09/14   PT Start Time 1503   PT Stop Time 1601   PT Time Calculation (min) 58 min   Activity Tolerance Patient tolerated treatment well;Patient limited by pain      Past Medical History  Diagnosis Date  . Hypertension   . Sleep apnea   . Hypothyroidism   . Arthritis   . Vertigo     Past Surgical History  Procedure Laterality Date  . Eye surgery    . Lacrimal duct probing w/ dacryoplasty Bilateral   . Cholecystectomy    . Knee arthroscopy Left   . Carpal tunnel release Bilateral   . Thyroidectomy Right     hemi-thyroidectomy  . Shoulder arthroscopy with open rotator cuff repair Right 09/03/2014    Procedure: SHOULDER ARTHROSCOPY WITH OPEN ROTATOR CUFF REPAIR;  Surgeon: Thornton Park, MD;  Location: [facility] ORS;  Service: Orthopedics;  Laterality: Right;    There were no vitals filed for this visit.  Visit Diagnosis:  Pain in joint, shoulder region, right  Stiffness of joint, shoulder region, right  Generalized weakness      Subjective Assessment - 10/30/14 1453    Subjective Pt. states she is doing well and has no new complaints.  Good compliance with HEP.     Limitations House hold activities;Other (comment)   Patient Stated Goals Increase R shoulder ROM/stablity.  Beekeeping, yard work    Currently in Pain? No/denies       QuickDASH: 34%.   OBJECTIVE: Therex.: UBE x 6 mins F/B with B UE assist (consistent cadence/ warm up and no charge). Supine R shoulder rhythmic stabilization in supine 30 sec. x 5. L sidelying R shoulder  ER/abduction with tactile cues (good scapular motion/ assist provided as needed) 10 x 2. See new HEP (issued RTB).  Supine R shoulder D1/D2 5x each with min. PT assist.  Manual: STM to R shoulder/ proximal biceps region.    Pt response for medical necessity: Great technique and progression with resisted ex. Program.  No increase pain in R shoulder.  Remains limited with standing R sh. IR/ end-ranged abd.         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    Baseline 9/29  QuickDASH 34%   Time 8   Period Weeks   Status Partially Met   PT LONG TERM GOAL #2   Title Pt will be independent with HEP to increase R shoulder AROM to [facility] as compared to L shoulder to promote return to househould chores/ ADL.     Time 8   Period Weeks  Status Partially Met   PT LONG TERM GOAL #3   Title Pt. able to manage hair/ dress with no increase in R shoulder pain/limitations.    Time 8   Period Weeks   Status Partially Met   PT LONG TERM GOAL #4   Title Pt will demonstrate R shoulder muscle strength to >3/5 MMT to improve overhead mobility/ bee keeping.      Time 8   Period Weeks   Status Partially Met       Problem List There are no active problems to display for this patient.  Pura Spice, PT, DPT # [id number]   10/30/2014, 3:03 PM  [facility] [address] [place], [place], [address] Phone: [phone]   Fax:  [phone]

## 2014-11-02 ENCOUNTER — Ambulatory Visit: Payer: 59 | Attending: Orthopedic Surgery | Admitting: Physical Therapy

## 2014-11-02 DIAGNOSIS — M25511 Pain in right shoulder: Secondary | ICD-10-CM | POA: Insufficient documentation

## 2014-11-02 DIAGNOSIS — M25611 Stiffness of right shoulder, not elsewhere classified: Secondary | ICD-10-CM | POA: Insufficient documentation

## 2014-11-02 DIAGNOSIS — R531 Weakness: Secondary | ICD-10-CM | POA: Insufficient documentation

## 2014-11-03 ENCOUNTER — Encounter: Payer: Self-pay | Admitting: Physical Therapy

## 2014-11-03 NOTE — Therapy (Signed)
[facility] [address] [place], [place], [address] Phone: [phone]   Fax:  [phone]  Physical Therapy Treatment  Patient Details  Name: Jordan Palmer MRN: [id number] Date of Birth: 21-Jun-1950 Referring Provider:  Thornton Park, MD  Encounter Date: 11/02/2014      PT End of Session - 11/03/14 0720    Visit Number 15   Number of Visits 24   Date for PT Re-Evaluation 12/07/14   PT Start Time 1455   PT Stop Time 1545   PT Time Calculation (min) 50 min   Activity Tolerance Patient tolerated treatment well;Patient limited by pain      Past Medical History  Diagnosis Date  . Hypertension   . Sleep apnea   . Hypothyroidism   . Arthritis   . Vertigo     Past Surgical History  Procedure Laterality Date  . Eye surgery    . Lacrimal duct probing w/ dacryoplasty Bilateral   . Cholecystectomy    . Knee arthroscopy Left   . Carpal tunnel release Bilateral   . Thyroidectomy Right     hemi-thyroidectomy  . Shoulder arthroscopy with open rotator cuff repair Right 09/03/2014    Procedure: SHOULDER ARTHROSCOPY WITH OPEN ROTATOR CUFF REPAIR;  Surgeon: Thornton Park, MD;  Location: [facility] ORS;  Service: Orthopedics;  Laterality: Right;    There were no vitals filed for this visit.  Visit Diagnosis:  Pain in joint, shoulder region, right  Stiffness of joint, shoulder region, right  Generalized weakness      Subjective Assessment - 11/03/14 0718    Subjective Pt reports no problems with HEP exercises with Red TB over the weekend. Pt states her pain is a 1/10.    Limitations House hold activities;Other (comment)   Patient Stated Goals Increase R shoulder ROM/stablity.  Beekeeping, yard work    Currently in Pain? Yes   Pain Score 1    Pain Location Shoulder   Pain Orientation Right   Pain Onset More than a month ago   Pain Frequency Intermittent          OBJECTIVE: Therex.: UBE x 6 mins F/B with B UE assist  (consistent cadence/ warm up and no charge). Standing Body Blade: B UE forwards, R UE F/B, R UE up and down 30 seconds x 3 each direction. Nautilus: Lateral pull downs 30#/Tricep extension 30#/adduction 10#/ER 10#/IR 10#/Biceps 10#/scapular retraction 20#/chest press 20# 10 x 2 each. Walkouts with 10# 5 x 3.  Grip Strength L/R: 55.2#/51.6# AROM: L shoulder flexion 153 deg in sitting L shoulder abduction 148 deg in sitting IR/ER WNL in supine R shoulder flexion 135 deg in sitting R shoulder abduction 125 deg in sitting IR WFL in supine ER 46 deg in supine MMT: L UE grossly 5/5; R UE 4/5 Pt response for medical necessity:  Progression with resisted ex. Program with min verbal and tactile cuing to decrease UT compensation.No complaints of increased pain with exercises.          PT Education - 11/03/14 0719    Education provided Yes   Education Details Pt educated on proper strength progression and given Green TB for scapular stabilization exercies. Pt told to still use red for shoulder abduction and ER.    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    Baseline 9/29  QuickDASH 34%   Time 8   Period Weeks   Status Partially Met   PT LONG TERM GOAL #2   Title Pt will be independent with HEP to increase R shoulder AROM to [facility] as compared to L shoulder to promote return to househould chores/ ADL.     Time 8   Period Weeks   Status Partially Met   PT LONG TERM GOAL #3   Title Pt. able to manage hair/ dress with no increase in R shoulder pain/limitations.    Time 8   Period Weeks   Status Partially Met   PT LONG TERM GOAL #4   Title Pt will demonstrate R shoulder muscle strength to >3/5 MMT to improve overhead mobility/ bee keeping.      Baseline R  4/5 MMT    Time 8   Period Weeks   Status Achieved   PT LONG TERM GOAL #5   Title Pt will perform 10 floor to waist box lifts with 30# in order to demonstrate safety work tasks.    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    Time 4   Period Weeks   Status New            Plan - 11/03/14 0725    Clinical Impression Statement Progression with strength training to include Nautilus weighted resistance exercises 10-30#. Pain limiting ER/overhead reaching to top of hair and IR behind the back. MMT: L UE grossly 5/5, R UE grossly 4/5, pain limited R MMT. L AROM: shoulder flexion: 153 deg in sitting, shoulder abduction 148 deg in sitting, IR/ER WNL in supine. R AROM: shoulder flexion 135 deg in sitting, shoulder abduction 125 deg in sitting, IR WNL in supine, ER 46 deg in supine. Grip strength L/R: 55.2#/51/6#. Pt compliant with HEP and able to progress to using green theraband for scapular stabiliziation exercises. Pt still has minimal upper trap compensation with exercises but is able to correct with verbal or tactile cuing.  PT to focus on strength training and progression of work-related tasks over next several weeks.    Pt will benefit from skilled therapeutic intervention in order to improve on the following deficits Decreased endurance;Hypomobility;Decreased activity tolerance;Decreased strength;Impaired UE functional use;Pain;Decreased mobility;Decreased range of motion;Impaired flexibility;Decreased coordination;Increased muscle spasms;Decreased scar mobility;Improper body mechanics   Rehab Potential Good   PT Frequency 2x / week   PT Duration 8 weeks   PT Treatment/Interventions ADLs/Self Care Home Management;Electrical Stimulation;Cryotherapy;Moist Heat;Therapeutic activities;Therapeutic  exercise;Functional mobility training;Patient/family education;Neuromuscular re-education;Scar mobilization;Passive range of motion   PT Next Visit Plan Progress R shoulder stability.     PT Home Exercise Plan Continue current HEP    Consulted and Agree with Plan of Care Patient        Problem List There are no active problems to display for this patient.  Pura Spice, PT, DPT # [id number]   11/03/2014, 9:44 AM  [facility] [address] [place], [place], [address] Phone: [phone]   Fax:  [phone]

## 2014-11-05 ENCOUNTER — Ambulatory Visit: Payer: 59 | Admitting: Physical Therapy

## 2014-11-05 DIAGNOSIS — M25511 Pain in right shoulder: Secondary | ICD-10-CM | POA: Diagnosis not present

## 2014-11-05 DIAGNOSIS — R531 Weakness: Secondary | ICD-10-CM

## 2014-11-05 DIAGNOSIS — M25611 Stiffness of right shoulder, not elsewhere classified: Secondary | ICD-10-CM

## 2014-11-05 NOTE — Therapy (Signed)
[facility] [address] [place], [place], [address] Phone: [phone]   Fax:  [phone]  Physical Therapy Treatment  Patient Details  Name: Jordan Palmer MRN: [id number] Date of Birth: Aug 31, 1950 Referring Provider:  Thornton Park, MD  Encounter Date: 11/05/2014      PT End of Session - 11/06/14 1747    Visit Number 16   Number of Visits 24   Date for PT Re-Evaluation 12/07/14   PT Start Time 4235   PT Stop Time 1546   PT Time Calculation (min) 50 min   Activity Tolerance Patient tolerated treatment well;Patient limited by pain   Behavior During Therapy [facility] for tasks assessed/performed      Past Medical History  Diagnosis Date  . Hypertension   . Sleep apnea   . Hypothyroidism   . Arthritis   . Vertigo     Past Surgical History  Procedure Laterality Date  . Eye surgery    . Lacrimal duct probing w/ dacryoplasty Bilateral   . Cholecystectomy    . Knee arthroscopy Left   . Carpal tunnel release Bilateral   . Thyroidectomy Right     hemi-thyroidectomy  . Shoulder arthroscopy with open rotator cuff repair Right 09/03/2014    Procedure: SHOULDER ARTHROSCOPY WITH OPEN ROTATOR CUFF REPAIR;  Surgeon: Thornton Park, MD;  Location: [facility] ORS;  Service: Orthopedics;  Laterality: Right;    There were no vitals filed for this visit.  Visit Diagnosis:  Pain in joint, shoulder region, right  Stiffness of joint, shoulder region, right  Generalized weakness      Subjective Assessment - 11/06/14 1746    Subjective Pt. states MD appt. went well and MD is happy with progress.  No pain or shoulder soreness reported at this time.     Limitations House hold activities;Other (comment)   Patient Stated Goals Increase R shoulder ROM/stablity.  Beekeeping, yard work    Currently in Pain? No/denies      OBJECTIVE: Therex.: UBE x 6 mins F/B with B UE assist (warm up and no charge). Nautilus: B sh. Adduction 30#/Tricep  extension 30#/ lat. Pull downs 30# (slight increase in pain)/ ER 10#/ scapular retraction 20#/chest press 20# 20x each.  Standing 2# dumbbell ER/ 4# bicep curls 20x each.  Standing rebounder with yellow ball 20x (shoulder flexion/ ext.).  Supine R shoulder rhythmic stabs 3x all planes (moderate resistance)- no increase c/o pain.     Pt. Continues to show consistent progress with strength training with proper technique and no increase c/o pain.         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    Baseline 9/29  QuickDASH 34%   Time 8   Period Weeks   Status Partially Met   PT LONG TERM GOAL #2   Title Pt will be independent with HEP to increase R shoulder AROM to [facility] as compared to L shoulder to promote return to househould chores/ ADL.     Time 8   Period Weeks   Status Partially Met   PT LONG TERM GOAL #3   Title Pt. able to manage hair/ dress with no increase in R shoulder pain/limitations.    Time 8   Period Weeks   Status Partially Met   PT LONG TERM GOAL #4  Title Pt will demonstrate R shoulder muscle strength to >3/5 MMT to improve overhead mobility/ bee keeping.      Baseline R 4/5 MMT    Time 8   Period Weeks   Status Achieved   PT LONG TERM GOAL #5   Title Pt will perform 10 floor to waist box lifts with 30# in order to demonstrate safety work tasks.    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    Time 4   Period Weeks   Status New               Plan - 11/06/14 1748    Clinical Impression Statement Progressing with increase resistance with all aspects of strength training for R shoulder.  Decresae R UT compensation with both shoulder flexion and abduction  movement in standing posture.     Pt will benefit from skilled therapeutic intervention in order to improve on the following deficits Decreased endurance;Hypomobility;Decreased activity tolerance;Decreased strength;Impaired UE functional use;Pain;Decreased mobility;Decreased range of motion;Impaired flexibility;Decreased coordination;Increased muscle spasms;Decreased scar mobility;Improper body mechanics   Rehab Potential Good   PT Frequency 2x / week   PT Duration 8 weeks   PT Treatment/Interventions ADLs/Self Care Home Management;Electrical Stimulation;Cryotherapy;Moist Heat;Therapeutic activities;Therapeutic exercise;Functional mobility training;Patient/family education;Neuromuscular re-education;Scar mobilization;Passive range of motion   PT Next Visit Plan Progress R shoulder stability.     PT Home Exercise Plan Continue current HEP    Consulted and Agree with Plan of Care Patient        Problem List There are no active problems to display for this patient.  Pura Spice, PT, DPT # [phone]   11/06/2014, 5:53 PM  [facility] [address] [place], [place], [address] Phone: [phone]   Fax:  [phone]

## 2014-11-09 ENCOUNTER — Encounter: Payer: Self-pay | Admitting: Physical Therapy

## 2014-11-09 ENCOUNTER — Ambulatory Visit: Payer: 59 | Admitting: Physical Therapy

## 2014-11-09 DIAGNOSIS — R531 Weakness: Secondary | ICD-10-CM

## 2014-11-09 DIAGNOSIS — M25611 Stiffness of right shoulder, not elsewhere classified: Secondary | ICD-10-CM

## 2014-11-09 DIAGNOSIS — M25511 Pain in right shoulder: Secondary | ICD-10-CM

## 2014-11-10 NOTE — Therapy (Signed)
[facility] [address] [place], [place], [address] Phone: [phone]   Fax:  [phone]  Physical Therapy Treatment  Patient Details  Name: Jordan Palmer MRN: [id number] Date of Birth: [birth year] Referring Provider:  Thornton Park, MD  Encounter Date: 11/09/2014      PT End of Session - 11/10/14 1248    Visit Number 17   Number of Visits 24   Date for PT Re-Evaluation 12/07/14   PT Start Time 2248   PT Stop Time 1548   PT Time Calculation (min) 49 min   Activity Tolerance Patient tolerated treatment well;Patient limited by pain   Behavior During Therapy [facility] for tasks assessed/performed      Past Medical History  Diagnosis Date  . Hypertension   . Sleep apnea   . Hypothyroidism   . Arthritis   . Vertigo     Past Surgical History  Procedure Laterality Date  . Eye surgery    . Lacrimal duct probing w/ dacryoplasty Bilateral   . Cholecystectomy    . Knee arthroscopy Left   . Carpal tunnel release Bilateral   . Thyroidectomy Right     hemi-thyroidectomy  . Shoulder arthroscopy with open rotator cuff repair Right 09/03/2014    Procedure: SHOULDER ARTHROSCOPY WITH OPEN ROTATOR CUFF REPAIR;  Surgeon: Thornton Park, MD;  Location: [facility] ORS;  Service: Orthopedics;  Laterality: Right;    There were no vitals filed for this visit.  Visit Diagnosis:  Pain in joint, shoulder region, right  Stiffness of joint, shoulder region, right  Generalized weakness      Subjective Assessment - 11/10/14 1244    Subjective Pt. reports minimal R shoulder soreness but no new complaints at this time.     Limitations House hold activities;Other (comment)   Patient Stated Goals Increase R shoulder ROM/stablity.  Beekeeping, yard work    Currently in Pain? No/denies      OBJECTIVE: Therex.: UBE x 6 mins F/B with B UE assist (warm up and no charge). Nautilus (with handles only today): B sh. Adduction 30#/Tricep extension 30#/  lat. Pull downs 20-30# (slight increase in pain and cuing/assist for technique)/ ER 10#/ scapular retraction 30#/chest press 20#/ bicep curls 10#/ sh. Ext. 30# 20x each. Supine wt. Wand shoulder flexion 10x/ 2# sh. Abd./ 2# alt. Flexion/ 2# serratus punches.  Supine R shoulder IR/ER stretches (primarily focusing on ER). Standing rebounder with yellow ball 20x (shoulder flexion/ ext.).  Standing wall push-ups 20x (proper technique with no breaks). Supine R shoulder rhythmic stabs 3x all planes (moderate resistance)- no increase c/o pain.   Pt. Continues to show consistent progress with strength training with proper technique and no increase c/o pain.  Pt. Will benefit from work specific training and lifting techniques.           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    Baseline 9/29  QuickDASH 34%   Time 8   Period Weeks   Status Partially Met   PT LONG TERM GOAL #2   Title Pt will be independent with HEP to increase R shoulder AROM to [facility] as compared to L shoulder to promote return to househould chores/ ADL.     Time 8   Period Weeks   Status Partially Met   PT LONG TERM  GOAL #3   Title Pt. able to manage hair/ dress with no increase in R shoulder pain/limitations.    Time 8   Period Weeks   Status Partially Met   PT LONG TERM GOAL #4   Title Pt will demonstrate R shoulder muscle strength to >3/5 MMT to improve overhead mobility/ bee keeping.      Baseline R 4/5 MMT    Time 8   Period Weeks   Status Achieved   PT LONG TERM GOAL #5   Title Pt will perform 10 floor to waist box lifts with 30# in order to demonstrate safety work tasks.    Time 4   Period Weeks   Status New   Additional Long Term Goals   Additional Long Term Goals Yes   PT LONG TERM GOAL #6   Title Pt will perform pushing, pulling and carrying with 30# in order to safely return to  work as Haematologist.    Time 4   Period Weeks   Status New            Plan - 11/10/14 1343    Clinical Impression Statement Pt. continues to increase R shoulder AROM (all planes) with marked increase in abd. with decrease c/o pain and min. to no compensatory movement patterns.  Good scapular mobility/ stability with R UE overhead reaching tasks.  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     Pt will benefit from skilled therapeutic intervention in order to improve on the following deficits Decreased endurance;Hypomobility;Decreased activity tolerance;Decreased strength;Impaired UE functional use;Pain;Decreased mobility;Decreased range of motion;Impaired flexibility;Decreased coordination;Increased muscle spasms;Decreased scar mobility;Improper body mechanics   Rehab Potential Good   PT Frequency 2x / week   PT Duration 8 weeks   PT Treatment/Interventions ADLs/Self Care Home Management;Electrical Stimulation;Cryotherapy;Moist Heat;Therapeutic activities;Therapeutic exercise;Functional mobility training;Patient/family education;Neuromuscular re-education;Scar mobilization;Passive range of motion   PT Next Visit Plan Progress R shoulder stability.     PT Home Exercise Plan Continue current HEP    Consulted and Agree with Plan of Care Patient        Problem List There are no active problems to display for this patient.  Pura Spice, PT, DPT # [phone]   11/10/2014, 1:56 PM  [facility] [address] [place], [place], [address] Phone: [phone]   Fax:  [phone]

## 2014-11-12 ENCOUNTER — Encounter: Payer: 59 | Admitting: Physical Therapy

## 2014-11-16 ENCOUNTER — Ambulatory Visit: Payer: 59 | Admitting: Physical Therapy

## 2014-11-16 ENCOUNTER — Encounter: Payer: Self-pay | Admitting: Physical Therapy

## 2014-11-16 DIAGNOSIS — R531 Weakness: Secondary | ICD-10-CM

## 2014-11-16 DIAGNOSIS — M25511 Pain in right shoulder: Secondary | ICD-10-CM

## 2014-11-16 DIAGNOSIS — M25611 Stiffness of right shoulder, not elsewhere classified: Secondary | ICD-10-CM

## 2014-11-16 NOTE — Therapy (Signed)
[place] [facility] [address] [place], [place], [address] Phone: [phone]   Fax:  [phone]  Physical Therapy Treatment  Patient Details  Name: Jordan Palmer MRN: [id number] Date of Birth: 10-24-1950 No Data Recorded  Encounter Date: 11/16/2014      PT End of Session - 11/16/14 1637    Visit Number 18   Number of Visits 24   Date for PT Re-Evaluation 12/07/14   PT Start Time 1508   PT Stop Time 1559   PT Time Calculation (min) 51 min   Activity Tolerance Patient tolerated treatment well;No increased pain   Behavior During Therapy [facility] for tasks assessed/performed      Past Medical History  Diagnosis Date  . Hypertension   . Sleep apnea   . Hypothyroidism   . Arthritis   . Vertigo     Past Surgical History  Procedure Laterality Date  . Eye surgery    . Lacrimal duct probing w/ dacryoplasty Bilateral   . Cholecystectomy    . Knee arthroscopy Left   . Carpal tunnel release Bilateral   . Thyroidectomy Right     hemi-thyroidectomy  . Shoulder arthroscopy with open rotator cuff repair Right 09/03/2014    Procedure: SHOULDER ARTHROSCOPY WITH OPEN ROTATOR CUFF REPAIR;  Surgeon: Thornton Park, MD;  Location: [facility] ORS;  Service: Orthopedics;  Laterality: Right;    There were no vitals filed for this visit.  Visit Diagnosis:  Pain in joint, shoulder region, right  Stiffness of joint, shoulder region, right  Generalized weakness      Subjective Assessment - 11/16/14 1636    Subjective Pt reports no pain or soreness in R shoulder. Pt states that she was unable to due her HEP over the weekend secondary to being out of town.     Limitations House hold activities;Other (comment)   Patient Stated Goals Increase R shoulder ROM/stablity.  Beekeeping, yard work    Currently in Pain? No/denies      OBJECTIVE: Therex.: UBE x 8 mins F/B with B UE assist (warm up and no charge). Nautilus (with handles only today): B sh.  Adduction 30#/Tricep extension 30#/ lat. Pull downs 30#/ scapular retraction 30#/chest press 20#/sh. Ext. 30# 25x each. Standing body blade: flexion/abduction/IR/ER/D1 30 seconds x 3 each direction.  Discussed resistance with HEP/ daily activities.   Pt. Continues to show consistent progress with strength training with proper technique and no increase c/o pain. Pt. Benefits for lifting and carrying per MD protocol to facilitate return to work.            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    Baseline 9/29  QuickDASH 34%   Time 8   Period Weeks   Status Partially Met   PT LONG TERM GOAL #2   Title Pt will be independent with HEP to increase R shoulder AROM to [facility] as compared to L shoulder to promote return to househould chores/ ADL.     Time 8   Period Weeks   Status Partially Met   PT LONG TERM GOAL #3   Title Pt. able to manage hair/ dress with no increase in R shoulder pain/limitations.    Time 8   Period Weeks   Status Partially Met   PT LONG TERM GOAL #4  Title Pt will demonstrate R shoulder muscle strength to >3/5 MMT to improve overhead mobility/ bee keeping.      Baseline R 4/5 MMT    Time 8   Period Weeks   Status Achieved   PT LONG TERM GOAL #5   Title Pt will perform 10 floor to waist box lifts with 30# in order to demonstrate safety work tasks.    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    Time 4   Period Weeks   Status New               Plan - 11/16/14 1637    Clinical Impression Statement Pt demonstrates full R shoulder AROM without c/o increased pain. Pt is able to perform all strengthening exercises without complaints of  soreness. Pt demonstrates good muscle endurance by being able to progress to 15 reps of each exercise. Pt able to perform lift and carry to simulate work task without increased complaints of pain.    Pt will benefit from skilled therapeutic intervention in order to improve on the following deficits Decreased endurance;Hypomobility;Decreased activity tolerance;Decreased strength;Impaired UE functional use;Pain;Decreased mobility;Decreased range of motion;Impaired flexibility;Decreased coordination;Increased muscle spasms;Decreased scar mobility;Improper body mechanics   Rehab Potential Good   PT Frequency 2x / week   PT Duration 8 weeks   PT Treatment/Interventions ADLs/Self Care Home Management;Electrical Stimulation;Cryotherapy;Moist Heat;Therapeutic activities;Therapeutic exercise;Functional mobility training;Patient/family education;Neuromuscular re-education;Scar mobilization;Passive range of motion   PT Next Visit Plan Progress R shoulder stability.     PT Home Exercise Plan Continue current HEP    Consulted and Agree with Plan of Care Patient        Problem List There are no active problems to display for this patient.  Pura Spice, PT, DPT # [phone]   11/17/2014, 4:03 PM  [facility] [address] [place], [place], [address] Phone: [phone]   Fax:  [phone]  Name: Jordan Palmer MRN: [id number] Date of Birth: July 01, 1950

## 2014-11-19 ENCOUNTER — Encounter: Payer: 59 | Admitting: Physical Therapy

## 2014-11-23 ENCOUNTER — Ambulatory Visit: Payer: 59 | Admitting: Physical Therapy

## 2014-11-23 DIAGNOSIS — M25511 Pain in right shoulder: Secondary | ICD-10-CM | POA: Diagnosis not present

## 2014-11-23 DIAGNOSIS — R531 Weakness: Secondary | ICD-10-CM

## 2014-11-23 DIAGNOSIS — M25611 Stiffness of right shoulder, not elsewhere classified: Secondary | ICD-10-CM

## 2014-11-24 ENCOUNTER — Encounter: Payer: Self-pay | Admitting: Physical Therapy

## 2014-11-24 NOTE — Therapy (Signed)
[facility] [address] [place], [place], [address] Phone: [phone]   Fax:  [phone]  Physical Therapy Treatment  Patient Details  Name: Jordan Palmer MRN: [id number] Date of Birth: 24-Dec-1950 No Data Recorded  Encounter Date: 11/23/2014      PT End of Session - 11/24/14 1119    Visit Number 19   Number of Visits 24   Date for PT Re-Evaluation 12/07/14   PT Start Time 4401   PT Stop Time 1545   PT Time Calculation (min) 48 min   Activity Tolerance Patient tolerated treatment well;No increased pain   Behavior During Therapy [facility] for tasks assessed/performed      Past Medical History  Diagnosis Date  . Hypertension   . Sleep apnea   . Hypothyroidism   . Arthritis   . Vertigo     Past Surgical History  Procedure Laterality Date  . Eye surgery    . Lacrimal duct probing w/ dacryoplasty Bilateral   . Cholecystectomy    . Knee arthroscopy Left   . Carpal tunnel release Bilateral   . Thyroidectomy Right     hemi-thyroidectomy  . Shoulder arthroscopy with open rotator cuff repair Right 09/03/2014    Procedure: SHOULDER ARTHROSCOPY WITH OPEN ROTATOR CUFF REPAIR;  Surgeon: Thornton Park, MD;  Location: [facility] ORS;  Service: Orthopedics;  Laterality: Right;    There were no vitals filed for this visit.  Visit Diagnosis:  Pain in joint, shoulder region, right  Stiffness of joint, shoulder region, right  Generalized weakness      Subjective Assessment - 11/24/14 1028    Subjective Pt reports mild soreness with overhead abduction/adduction. Pt reports no pain or new complaints upon arrival to PT tx session.    Limitations House hold activities;Other (comment)   Patient Stated Goals Increase R shoulder ROM/stablity.  Beekeeping, yard work    Currently in Pain? No/denies         OBJECTIVE: Warm up: UBE 6 mins F/B (no charge). There ex: Serratus punches with 4# dumbells x 20. Nautlius: IR 10-#/ER 10#/adduction  30#/chest press 30#/bicep curls 30#/tricep extension 30#/scapular retraction 50#/lat pull down 50# x 20 each.  Standing B shoulder flexion/ abd. To full AROM (pain tolerable).  Discussed work-related tasks/ lifting patients and transition from limited duty to full duty.    Pt response to Tx for medical necessity: Strengthening continues to improve functional ability to use her R shoulder. Pt benefits from return to work specific strengthening tasks.          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    Baseline 9/29  QuickDASH 34%   Time 8   Period Weeks   Status Partially Met   PT LONG TERM GOAL #2   Title Pt will be independent with HEP to increase R shoulder AROM to [facility] as compared to L shoulder to promote return to househould chores/ ADL.     Time 8   Period Weeks   Status Partially Met   PT LONG TERM GOAL #3   Title Pt. able to manage hair/ dress with no increase in R shoulder pain/limitations.    Time 8   Period Weeks   Status Partially Met   PT LONG TERM GOAL #4   Title Pt will demonstrate R  shoulder muscle strength to >3/5 MMT to improve overhead mobility/ bee keeping.      Baseline R 4/5 MMT    Time 8   Period Weeks   Status Achieved   PT LONG TERM GOAL #5   Title Pt will perform 10 floor to waist box lifts with 30# in order to demonstrate safety work tasks.    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    Time 4   Period Weeks   Status New               Plan - 11/24/14 1120    Clinical Impression Statement Pt demonstrates increased muscle endurance with ability to perform all 20 repetitions without a rest break. Pt demonstrates good technique with all  strengthening exercises. Pt limited in pain free strengthening range for ER. Pt has full AROM of L shoulder. Pt is planning to return to work after MD f/u next week.    Pt will benefit from skilled therapeutic intervention in order to improve on the following deficits Decreased endurance;Hypomobility;Decreased activity tolerance;Decreased strength;Impaired UE functional use;Pain;Decreased mobility;Decreased range of motion;Impaired flexibility;Decreased coordination;Increased muscle spasms;Decreased scar mobility;Improper body mechanics   Rehab Potential Good   PT Frequency 2x / week   PT Duration 8 weeks   PT Treatment/Interventions ADLs/Self Care Home Management;Electrical Stimulation;Cryotherapy;Moist Heat;Therapeutic activities;Therapeutic exercise;Functional mobility training;Patient/family education;Neuromuscular re-education;Scar mobilization;Passive range of motion   PT Next Visit Plan Progress R shoulder stability.     PT Home Exercise Plan Continue current HEP    Consulted and Agree with Plan of Care Patient        Problem List There are no active problems to display for this patient.   Burke Keels Julio Zappia,SPT 11/24/2014, 12:31 PM  [facility] [address] [place], [place], [address] Phone: [phone]   Fax:  [phone]  Name: Jordan Palmer MRN: [id number] Date of Birth: May 15, 1950

## 2014-11-26 ENCOUNTER — Encounter: Payer: Self-pay | Admitting: Physical Therapy

## 2014-11-26 ENCOUNTER — Ambulatory Visit: Payer: 59 | Admitting: Physical Therapy

## 2014-11-26 DIAGNOSIS — R531 Weakness: Secondary | ICD-10-CM

## 2014-11-26 DIAGNOSIS — M25511 Pain in right shoulder: Secondary | ICD-10-CM | POA: Diagnosis not present

## 2014-11-26 DIAGNOSIS — M25611 Stiffness of right shoulder, not elsewhere classified: Secondary | ICD-10-CM

## 2014-11-26 NOTE — Therapy (Signed)
[facility] [address] [place], [place], [address] Phone: [phone]   Fax:  [phone]  Physical Therapy Treatment  Patient Details  Name: Jordan Palmer MRN: [id number] Date of Birth: 11/24/1950 No Data Recorded  Encounter Date: 11/26/2014      PT End of Session - 11/26/14 2229    Visit Number 20   Number of Visits 24   Date for PT Re-Evaluation 12/07/14   PT Start Time 1559   PT Stop Time 1655   PT Time Calculation (min) 56 min   Activity Tolerance Patient tolerated treatment well;No increased pain   Behavior During Therapy [facility] for tasks assessed/performed      Past Medical History  Diagnosis Date  . Hypertension   . Sleep apnea   . Hypothyroidism   . Arthritis   . Vertigo     Past Surgical History  Procedure Laterality Date  . Eye surgery    . Lacrimal duct probing w/ dacryoplasty Bilateral   . Cholecystectomy    . Knee arthroscopy Left   . Carpal tunnel release Bilateral   . Thyroidectomy Right     hemi-thyroidectomy  . Shoulder arthroscopy with open rotator cuff repair Right 09/03/2014    Procedure: SHOULDER ARTHROSCOPY WITH OPEN ROTATOR CUFF REPAIR;  Surgeon: Thornton Park, MD;  Location: [facility] ORS;  Service: Orthopedics;  Laterality: Right;    There were no vitals filed for this visit.  Visit Diagnosis:  Pain in joint, shoulder region, right  Stiffness of joint, shoulder region, right  Generalized weakness      Subjective Assessment - 11/26/14 2227    Subjective Pt reports soreness from yard work and pushing a wheelbarrow yesterday. Pt reports no pain.    Limitations House hold activities;Other (comment)   Patient Stated Goals Increase R shoulder ROM/stablity.  Beekeeping, yard work    Currently in Pain? No/denies       OBJECTIVE: There ex: UBE 6 mins F/B (warm up, no charge). In standing with 3# weights: overhead shoulder press/anterior raises for anterior deltoid/horizontal  abduction/biceps/triceps x 20 each. Pain in biceps noted with overhead shoulder press and horizontal abduction. 30 push ups with proper technique without verbal cuing on parallel bars. Prone Is/Ts/Ys x 20 each. Increased complaints of pain with Ts; proper technique maintained throughout treatment. Prone shoulder row with 3# weight x 20. No upper trap compensation noted, proper activation and motion of scapula noted.   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 Pt is strengthening and increasing R shoulder musculature within MD protocol.         PT Education - 11/26/14 2227    Education provided Yes   Education Details Pt given prone I,T,Y exercises without weight to increase strengthening against gravity.   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    Baseline 9/29  QuickDASH 34%   Time 8   Period Weeks   Status Partially Met   PT LONG TERM GOAL #2   Title Pt will be independent with HEP to increase R shoulder AROM to [facility] as compared to  L shoulder to promote return to househould chores/ ADL.     Time 8   Period Weeks   Status Partially Met   PT LONG TERM GOAL #3   Title Pt. able to manage hair/ dress with no increase in R shoulder pain/limitations.    Time 8   Period Weeks   Status Partially Met   PT LONG TERM GOAL #4   Title Pt will demonstrate R shoulder muscle strength to >3/5 MMT to improve overhead mobility/ bee keeping.      Baseline R 4/5 MMT    Time 8   Period Weeks   Status Achieved   PT LONG TERM GOAL #5   Title Pt will perform 10 floor to waist box lifts with 30# in order to demonstrate safety work tasks.     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    Time 4   Period Weeks   Status New            Plan - 11/26/14 2229    Clinical Impression Statement Pt demonstrates increased muscle strengthening by demonstrating proper form with all movements against gravity.  Pt demonstrates muscle fatigue in R  shoudler musculature with overhead activities. Pt is pain limited in ER. Pt is able to acheive [facility] in AROM in standing. Pt to discuss return to work weight restriction on 11/2 in order to allow PT to help smooth the transition back to work.   Pt will benefit from skilled therapeutic intervention in order to improve on the following deficits Decreased endurance;Hypomobility;Decreased activity tolerance;Decreased strength;Impaired UE functional use;Pain;Decreased mobility;Decreased range of motion;Impaired flexibility;Decreased coordination;Increased muscle spasms;Decreased scar mobility;Improper body mechanics   Rehab Potential Good   PT Frequency 2x / week   PT Duration 8 weeks   PT Treatment/Interventions ADLs/Self Care Home Management;Electrical Stimulation;Cryotherapy;Moist Heat;Therapeutic activities;Therapeutic exercise;Functional mobility training;Patient/family education;Neuromuscular re-education;Scar mobilization;Passive range of motion   PT Next Visit Plan Progress R shoulder stability.     PT Home Exercise Plan given prone I,T,Y   Consulted and Agree with Plan of Care Patient        Problem List There are no active problems to display for this patient.   Lavone Neri, SPT 11/26/2014, 10:32 PM  [place] [facility] [address] [place], [place], [address] Phone: [phone]   Fax:  [phone]  Name: Jordan Palmer MRN: [id number] Date of  Birth: 1950-02-20

## 2014-11-30 ENCOUNTER — Encounter: Payer: Self-pay | Admitting: Physical Therapy

## 2014-11-30 ENCOUNTER — Ambulatory Visit: Payer: 59 | Admitting: Physical Therapy

## 2014-11-30 DIAGNOSIS — M25611 Stiffness of right shoulder, not elsewhere classified: Secondary | ICD-10-CM

## 2014-11-30 DIAGNOSIS — M25511 Pain in right shoulder: Secondary | ICD-10-CM | POA: Diagnosis not present

## 2014-11-30 DIAGNOSIS — R531 Weakness: Secondary | ICD-10-CM

## 2014-11-30 NOTE — Therapy (Signed)
[place] [facility] [address]. [place], [place], [address] Phone: [phone]   Fax:  [phone]  Physical Therapy Treatment  Patient Details  Name: Jordan Palmer MRN: [id number] Date of Birth: [birth year] No Data Recorded  Encounter Date: 11/30/2014      PT End of Session - 11/30/14 1542    Visit Number 21   Number of Visits 24   Date for PT Re-Evaluation 12/07/14   PT Start Time 1455   PT Stop Time 1540   PT Time Calculation (min) 45 min   Activity Tolerance Patient tolerated treatment well;No increased pain   Behavior During Therapy [facility] for tasks assessed/performed      Past Medical History  Diagnosis Date  . Hypertension   . Sleep apnea   . Hypothyroidism   . Arthritis   . Vertigo     Past Surgical History  Procedure Laterality Date  . Eye surgery    . Lacrimal duct probing w/ dacryoplasty Bilateral   . Cholecystectomy    . Knee arthroscopy Left   . Carpal tunnel release Bilateral   . Thyroidectomy Right     hemi-thyroidectomy  . Shoulder arthroscopy with open rotator cuff repair Right 09/03/2014    Procedure: SHOULDER ARTHROSCOPY WITH OPEN ROTATOR CUFF REPAIR;  Surgeon: Thornton Park, MD;  Location: [facility] ORS;  Service: Orthopedics;  Laterality: Right;    There were no vitals filed for this visit.  Visit Diagnosis:  Pain in joint, shoulder region, right  Stiffness of joint, shoulder region, right  Generalized weakness      Subjective Assessment - 11/30/14 1541    Subjective Pt reports no soreness or pain upon arrival to PT tx session. Pt states that prone horizontal abduction is still limited due to pain. Pt has been able to sleep in the bed for the past 4 nights.    Limitations House hold activities;Other (comment)   Patient Stated Goals Increase R shoulder ROM/stablity.  Beekeeping, yard work    Currently in Pain? No/denies        OBJECTIVE: Quick DASH: 12.5%. Grip Strength: L 52.9 #/ R 27.9  # AROM in sitting: Flexion L/R: 141 deg/134 deg Abduction L/R: 149 deg/141 deg MMT:  Flexion L/R: Bilaterally 4+/5 Abduction: 4+/5 L, 4/5R There ex: UBE F/B 4 mins (no charge). Anterior deltoid raises with 3# dumbells x 20 each (good form, no UT compensation noted). Horizontal abduction with 3# dumbells in standing 10 x 2 each (minimal UT compensation on the R side to reach shoulder height, pt able to self-correct). D1 and D2 with 2# weight and proper technique x 20. Nautilus: Lat pull down 40#/scapular retraction 40#/chest press 30# x 20 each with proper technique. Discussed HEP at length and technique for proper mechanics.   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           PT Long Term Goals - 11/30/14 1554    PT LONG TERM GOAL #1   Title Pt will show an improvement in Quick DASH to < 20% in order to return to work (OR Marine scientist).    Baseline 9/29  QuickDASH 34%   Time 8   Period Weeks   Status Achieved   PT LONG TERM GOAL #2   Title Pt will be independent with HEP to increase R shoulder AROM to [facility] as compared to L shoulder to  promote return to househould chores/ ADL.     Time 8   Period Weeks   Status Achieved   PT LONG TERM GOAL #3   Title Pt. able to manage hair/ dress with no increase in R shoulder pain/limitations.    Time 8   Period Weeks   Status Achieved   PT LONG TERM GOAL #4   Title Pt will demonstrate R shoulder muscle strength to >3/5 MMT to improve overhead mobility/ bee keeping.      Baseline R 4/5 MMT    Time 8   Period Weeks   Status Achieved   PT LONG TERM GOAL #5   Title Pt will perform 10 floor to waist box lifts with 30# in order to demonstrate safety work tasks.    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     Time 4   Period Weeks   Status On-going               Plan - 11/30/14 1542    Clinical Impression Statement Pt demonstrates full AROM in standing and sitting. Pt has progressed within guidelines of MD protocol with strenghtening exercises to faciliate return to work. AROM in sitting: Flexion L/R: 141 deg/134 deg; Abduction L/R: 149deg/141deg. MMT: L abduction and flexion 4+/5; right flexion 4+/5, right abduction 4/5. Grip strength L/R: 52.9#/57.9#. Quick DASH: 12.5%.Pt is limited by soreness and muscle weakness in abduction as compared to flexion. Pt is able to demonstrate increased muscle endurance as compared to initial  strengthening. Pt demonstrates decreased compensation from upper trap when performing overhead reaching with the R shoulder.    Pt will benefit from skilled therapeutic intervention in order to improve on the following deficits Decreased endurance;Hypomobility;Decreased activity tolerance;Decreased strength;Impaired UE functional use;Pain;Decreased mobility;Decreased range of motion;Impaired flexibility;Decreased coordination;Increased muscle spasms;Decreased scar mobility;Improper body mechanics   Rehab Potential Good   PT Frequency 2x / week   PT Duration 8 weeks   PT Treatment/Interventions ADLs/Self Care Home Management;Electrical Stimulation;Cryotherapy;Moist Heat;Therapeutic activities;Therapeutic exercise;Functional mobility training;Patient/family education;Neuromuscular re-education;Scar mobilization;Passive range of motion   PT Next Visit Plan reassess and discuss/perform work related tasks/discuss MD appt   PT Home Exercise Plan continue current plan   Consulted and Agree with Plan of Care Patient        Problem List There are no active problems to display for this patient.  Pura Spice, PT, DPT # [phone]   11/30/2014, 8:24 PM  [place] [facility] [address] [place], [place], [address] Phone: [phone]    Fax:  [phone]  Name: ELINE GENG MRN: [id number] Date of Birth: [birth year]

## 2014-12-03 ENCOUNTER — Encounter: Payer: Self-pay | Admitting: Physical Therapy

## 2014-12-03 ENCOUNTER — Ambulatory Visit: Payer: 59 | Attending: Orthopedic Surgery | Admitting: Physical Therapy

## 2014-12-03 DIAGNOSIS — R531 Weakness: Secondary | ICD-10-CM | POA: Diagnosis present

## 2014-12-03 DIAGNOSIS — M25611 Stiffness of right shoulder, not elsewhere classified: Secondary | ICD-10-CM | POA: Insufficient documentation

## 2014-12-03 DIAGNOSIS — M25511 Pain in right shoulder: Secondary | ICD-10-CM | POA: Insufficient documentation

## 2014-12-03 NOTE — Therapy (Signed)
[place] [facility] [address] [place], [place], [address] Phone: [phone]   Fax:  [phone]  Physical Therapy Treatment  Patient Details  Name: SABREA SANKEY MRN: [id number] Date of Birth: 05/24/1950 No Data Recorded  Encounter Date: 12/03/2014      PT End of Session - 12/03/14 1749    Visit Number 22   Number of Visits 24   Date for PT Re-Evaluation 12/07/14   PT Start Time 1501   PT Stop Time 7619   PT Time Calculation (min) 54 min   Activity Tolerance Patient tolerated treatment well;No increased pain   Behavior During Therapy [facility] for tasks assessed/performed      Past Medical History  Diagnosis Date  . Hypertension   . Sleep apnea   . Hypothyroidism   . Arthritis   . Vertigo     Past Surgical History  Procedure Laterality Date  . Eye surgery    . Lacrimal duct probing w/ dacryoplasty Bilateral   . Cholecystectomy    . Knee arthroscopy Left   . Carpal tunnel release Bilateral   . Thyroidectomy Right     hemi-thyroidectomy  . Shoulder arthroscopy with open rotator cuff repair Right 09/03/2014    Procedure: SHOULDER ARTHROSCOPY WITH OPEN ROTATOR CUFF REPAIR;  Surgeon: Thornton Park, MD;  Location: [facility] ORS;  Service: Orthopedics;  Laterality: Right;    There were no vitals filed for this visit.  Visit Diagnosis:  Pain in joint, shoulder region, right  Stiffness of joint, shoulder region, right  Generalized weakness      Subjective Assessment - 12/03/14 1748    Subjective Pt reports no new complaints. Pt states her MD f/u went well and that she is returning to work on Monday (11/7) with a 10# lifting/pushing/pulling restriction.   Limitations House hold activities;Other (comment)   Patient Stated Goals Increase R shoulder ROM/stablity.  Beekeeping, yard work    Currently in Pain? No/denies      OBJECTIVE: There ex: UBE F/B 10 mins (no charge). Body blade: flexion/ER/IR/scaption 30 seconds x 3 each  direction. No compensations noted and proper form to isolate shoulder musculature noted. 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 Good technique noted and proper mechanics. Nautlius: scaption 40#/lat pull down50#/chest press 30#/bent over row 30#/bicep 30#/triceps 30# 20 each direction with no upper trap compensation noted.  Pt response to tx for medical necessity: Pt progressed with strengthening and is independent with HEP. Pt discharged from PT at this time and cleared from a PT standpoint to return to work following her MD protocol.        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    Baseline 9/29  QuickDASH 34%   Time 8   Period Weeks   Status Achieved   PT LONG TERM GOAL #2   Title Pt will be independent with HEP to increase R shoulder AROM to [facility] as compared to L shoulder to promote return to househould chores/ ADL.     Time 8   Period Weeks   Status Achieved   PT LONG TERM GOAL #3   Title Pt. able to manage hair/ dress with no increase in R shoulder pain/limitations.    Time 8  Period Weeks   Status Achieved   PT LONG TERM GOAL #4   Title Pt will demonstrate R shoulder muscle strength to >3/5 MMT to improve overhead mobility/ bee keeping.      Baseline R 4/5 MMT    Time 8   Period Weeks   Status Achieved   PT LONG TERM GOAL #5   Title Pt will perform 10 floor to waist box lifts with 30# in order to demonstrate safety work tasks.    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    Time 4   Period Weeks   Status Not Met               Plan - 12/03/14 1749    Clinical  Impression Statement Pt demonstrates full AROM with no upper trap compnesations on the R side. Pt is able to perform all strength training without upper trap compensations. Pt demonstrates proper lifting techniques in order to ensure safe body mechanics with returning to work. From a PT standpoint, pt is able to return to work following MD protocol. Pt is discharged from short term skilled PT at this time due to progressing well and being independent wtih HEP.  Pt educated to call PT if any problems arise or needs guidance with any exercises.   Pt will benefit from skilled therapeutic intervention in order to improve on the following deficits Decreased endurance;Hypomobility;Decreased activity tolerance;Decreased strength;Impaired UE functional use;Pain;Decreased mobility;Decreased range of motion;Impaired flexibility;Decreased coordination;Increased muscle spasms;Decreased scar mobility;Improper body mechanics   Rehab Potential Good   PT Treatment/Interventions ADLs/Self Care Home Management;Electrical Stimulation;Cryotherapy;Moist Heat;Therapeutic activities;Therapeutic exercise;Functional mobility training;Patient/family education;Neuromuscular re-education;Scar mobilization;Passive range of motion   PT Home Exercise Plan continue current plan   Consulted and Agree with Plan of Care Patient        Problem List There are no active problems to display for this patient.  Pura Spice, PT, DPT # [phone]   12/04/2014, 5:48 PM  [place] [facility] [address] [place], [place], [address] Phone: [phone]   Fax:  [phone]  Name: CRYSTALYN DELIA MRN: [id number] Date of Birth: [birth year]

## 2015-05-06 ENCOUNTER — Encounter: Payer: Self-pay | Admitting: Emergency Medicine

## 2015-05-06 ENCOUNTER — Ambulatory Visit
Admission: EM | Admit: 2015-05-06 | Discharge: 2015-05-06 | Disposition: A | Discharge status: Home / Self Care | Payer: PRIVATE HEALTH INSURANCE | Attending: Family Medicine | Admitting: Family Medicine

## 2015-05-06 DIAGNOSIS — J029 Acute pharyngitis, unspecified: Secondary | ICD-10-CM | POA: Diagnosis not present

## 2015-05-06 DIAGNOSIS — J069 Acute upper respiratory infection, unspecified: Secondary | ICD-10-CM

## 2015-05-06 LAB — RAPID STREP SCREEN (MED CTR MEBANE ONLY): Streptococcus, Group A Screen (Direct): NEGATIVE

## 2015-05-06 LAB — RAPID INFLUENZA A&B ANTIGENS (ARMC ONLY): INFLUENZA A (ARMC): NEGATIVE

## 2015-05-06 LAB — RAPID INFLUENZA A&B ANTIGENS: Influenza B (ARMC): NEGATIVE

## 2015-05-06 MED ORDER — HYDROCOD POLST-CPM POLST ER 10-8 MG/5ML PO SUER: 5.0000 mL | Freq: Two times a day (BID) | ORAL | Status: DC

## 2015-05-06 MED ORDER — AZITHROMYCIN 250 MG PO TABS: ORAL_TABLET | ORAL | Status: DC

## 2015-05-06 MED ORDER — BENZONATATE 100 MG PO CAPS: ORAL_CAPSULE | ORAL | Status: DC

## 2015-05-06 NOTE — ED Notes (Signed)
Patient states she thinks she has had the flu for about a week, sore throat, coughing and ear pain

## 2015-05-06 NOTE — ED Provider Notes (Signed)
CSN: [id number]     Arrival date & time 05/06/15  1907 History   First MD Initiated Contact with Patient 05/06/15 2007     Chief Complaint  Patient presents with  . Sore Throat   (Consider location/radiation/quality/duration/timing/severity/associated sxs/prior Treatment) HPI  This 64 year old female who presents with a sore throat extending into her ears coughing with yellow sputum and ear pain. He said this for least 6 days. 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 She just feels awful. Temperature was not obtained due to equipment malfunction according to the nurse and the patient. Pulse 92 respirations 18 blood pressure 140/83 O2 sats 99% on room air.      Past Medical History  Diagnosis Date  . Hypertension   . Sleep apnea   . Hypothyroidism   . Arthritis   . Vertigo    Past Surgical History  Procedure Laterality Date  . Eye surgery    . Lacrimal duct probing w/ dacryoplasty Bilateral   . Cholecystectomy    . Knee arthroscopy Left   . Carpal tunnel release Bilateral   . Thyroidectomy Right     hemi-thyroidectomy  . Shoulder arthroscopy with open rotator cuff repair Right 09/03/2014    Procedure: SHOULDER ARTHROSCOPY WITH OPEN ROTATOR CUFF REPAIR;  Surgeon: Thornton Park, MD;  Location: [facility] ORS;  Service: Orthopedics;  Laterality: Right;   Family History  Problem Relation Age of Onset  . Diabetes type II Father   . CAD Father   . Alzheimer's disease Mother    Social History  Substance Use Topics  . Smoking status: Never Smoker   . Smokeless tobacco: Never Used  . Alcohol Use: Yes     Comment: occasional   OB History    No data available     Review of Systems  Constitutional: Positive for chills, activity change and fatigue. Negative for fever and diaphoresis.  HENT: Positive for congestion, nosebleeds, postnasal drip, rhinorrhea, sinus pressure and sore throat.   Respiratory: Positive for cough  and shortness of breath.   All other systems reviewed and are negative.   Allergies  Codeine  Home Medications   Prior to Admission medications   Medication Sig Start Date End Date Taking? Authorizing Provider  acetaminophen (TYLENOL) 500 MG tablet Take 1,000 mg by mouth every 6 (six) hours as needed.   Yes Historical Provider, MD  fexofenadine (ALLEGRA) 180 MG tablet Take 180 mg by mouth daily.   Yes Historical Provider, MD  levothyroxine (SYNTHROID, LEVOTHROID) 125 MCG tablet Take 125 mcg by mouth daily before breakfast.   Yes Historical Provider, MD  lisinopril-hydrochlorothiazide (PRINZIDE,ZESTORETIC) 20-25 MG per tablet Take 1 tablet by mouth daily.   Yes Historical Provider, MD  meloxicam (MOBIC) 7.5 MG tablet Take 7.5 mg by mouth as needed for pain.   Yes Historical Provider, MD  azithromycin (ZITHROMAX Z-PAK) 250 MG tablet Use as per package instructions 05/06/15   Lorin Picket, PA-C  benzonatate (TESSALON) 100 MG capsule Take 1 capsule 3 times a day for cough 05/06/15   Lorin Picket, PA-C  chlorpheniramine-HYDROcodone (TUSSIONEX PENNKINETIC ER) 10-8 MG/5ML SUER Take 5 mLs by mouth 2 (two) times daily. 05/06/15   Lorin Picket, PA-C  estrogen, conjugated,-medroxyprogesterone (PREMPRO) 0.3-1.5 MG per tablet Take 1 tablet by mouth daily.    Historical Provider, MD  oxyCODONE (OXY IR/ROXICODONE) 5 MG immediate release tablet Take 1-2 tablets (5-10 mg total) by mouth  every 3 (three) hours as needed for severe pain. 09/03/14   Thornton Park, MD  promethazine (PHENERGAN) 12.5 MG tablet Take 1 tablet (12.5 mg total) by mouth every 4 (four) hours as needed for nausea or vomiting. 09/03/14   Thornton Park, MD   Meds Ordered and Administered this Visit  Medications - No data to display  BP 140/83 mmHg  Pulse 92  Resp 18  Ht 5\' 1"  (1.549 m)  Wt 170 lb (77.111 kg)  BMI 32.14 kg/m2  SpO2 99% No data found.   Physical Exam  Constitutional: She is oriented to person, place, and  time. She appears well-developed and well-nourished. No distress.  HENT:  Head: Normocephalic and atraumatic.  Right Ear: External ear normal.  Left Ear: External ear normal.  Nose: Nose normal.  Mouth/Throat: Oropharynx is clear and moist. No oropharyngeal exudate.  Eyes: Conjunctivae are normal. Pupils are equal, round, and reactive to light.  Neck: Normal range of motion. Neck supple.  Pulmonary/Chest: Effort normal and breath sounds normal. No respiratory distress. She has no wheezes. She has no rales.  Musculoskeletal: Normal range of motion. She exhibits no edema or tenderness.  Lymphadenopathy:    She has no cervical adenopathy.  Neurological: She is alert and oriented to person, place, and time.  Skin: Skin is warm and dry. She is not diaphoretic.  Psychiatric: She has a normal mood and affect. Her behavior is normal. Judgment and thought content normal.  Nursing note and vitals reviewed.   ED Course  Procedures (including critical care time)  Labs Review Labs Reviewed  RAPID INFLUENZA A&B ANTIGENS ([facility] ONLY)  RAPID STREP SCREEN (NOT AT [facility])  CULTURE, GROUP A STREP [facility])    Imaging Review No results found.   Visual Acuity Review  Right Eye Distance:   Left Eye Distance:   Bilateral Distance:    Right Eye Near:   Left Eye Near:    Bilateral Near:         MDM   1. Acute pharyngitis, unspecified pharyngitis type   2. Acute URI    Discharge Medication List as of 05/06/2015  8:25 PM    START taking these medications   Details  azithromycin (ZITHROMAX Z-PAK) 250 MG tablet Use as per package instructions, Normal    benzonatate (TESSALON) 100 MG capsule Take 1 capsule 3 times a day for cough, Normal    chlorpheniramine-HYDROcodone (TUSSIONEX PENNKINETIC ER) 10-8 MG/5ML SUER Take 5 mLs by mouth 2 (two) times daily., Starting 05/06/2015, Until Discontinued, Print      Plan: 1. Test/x-ray results and diagnosis reviewed with patient 2. rx as per orders;  risks, benefits, potential side effects reviewed with patient 3. Recommend supportive treatment with Rest and fluids. I will start her on Z-Pak since she has had this for a while and has missed work. Also provide provider with Tussionex and Tessalon Perles to help with her cough reflex. She states she is using Flonase and I have advised her to continue using it throughout the pollen season. She is not improving in a day or 2 she should follow-up with her primary care physician. 4. F/u prn if symptoms worsen or don't improve     Lorin Picket, PA-C 05/06/15 2033

## 2015-05-06 NOTE — Discharge Instructions (Signed)
Cool Mist Vaporizers Vaporizers may help relieve the symptoms of a cough and cold. They add moisture to the air, which helps mucus to become thinner and less sticky. This makes it easier to breathe and cough up secretions. Cool mist vaporizers do not cause serious burns like hot mist vaporizers, which may also be called steamers or humidifiers. Vaporizers have not been proven to help with colds. You should not use a vaporizer if you are allergic to mold. HOME CARE INSTRUCTIONS  Follow the package instructions for the vaporizer.  Do not use anything other than distilled water in the vaporizer.  Do not run the vaporizer all of the time. This can cause mold or bacteria to grow in the vaporizer.  Clean the vaporizer after each time it is used.  Clean and dry the vaporizer well before storing it.  Stop using the vaporizer if worsening respiratory symptoms develop.   This information is not intended to replace advice given to you by your health care provider. Make sure you discuss any questions you have with your health care provider.   Document Released: 10/14/2003 Document Revised: 01/21/2013 Document Reviewed: 06/05/2012 Elsevier Interactive Patient Education 2016 Elsevier Inc.  Pharyngitis Pharyngitis is redness, pain, and swelling (inflammation) of your pharynx.  CAUSES  Pharyngitis is usually caused by infection. Most of the time, these infections are from viruses (viral) and are part of a cold. However, sometimes pharyngitis is caused by bacteria (bacterial). Pharyngitis can also be caused by allergies. Viral pharyngitis may be spread from person to person by coughing, sneezing, and personal items or utensils (cups, forks, spoons, toothbrushes). Bacterial pharyngitis may be spread from person to person by more intimate contact, such as kissing.  SIGNS AND SYMPTOMS  Symptoms of pharyngitis include:   Sore throat.   Tiredness (fatigue).   Low-grade fever.   Headache.  Joint  pain and muscle aches.  Skin rashes.  Swollen lymph nodes.  Plaque-like film on throat or tonsils (often seen with bacterial pharyngitis). DIAGNOSIS  Your health care provider will ask you questions about your illness and your symptoms. Your medical history, along with a physical exam, is often all that is needed to diagnose pharyngitis. Sometimes, a rapid strep test is done. Other lab tests may also be done, depending on the suspected cause.  TREATMENT  Viral pharyngitis will usually get better in 3-4 days without the use of medicine. Bacterial pharyngitis is treated with medicines that kill germs (antibiotics).  HOME CARE INSTRUCTIONS   Drink enough water and fluids to keep your urine clear or pale yellow.   Only take over-the-counter or prescription medicines as directed by your health care provider:   If you are prescribed antibiotics, make sure you finish them even if you start to feel better.   Do not take aspirin.   Get lots of rest.   Gargle with 8 oz of salt water ( tsp of salt per 1 qt of water) as often as every 1-2 hours to soothe your throat.   Throat lozenges (if you are not at risk for choking) or sprays may be used to soothe your throat. SEEK MEDICAL CARE IF:   You have large, tender lumps in your neck.  You have a rash.  You cough up green, yellow-brown, or bloody spit. SEEK IMMEDIATE MEDICAL CARE IF:   Your neck becomes stiff.  You drool or are unable to swallow liquids.  You vomit or are unable to keep medicines or liquids down.  You have severe pain  that does not go away with the use of recommended medicines.  You have trouble breathing (not caused by a stuffy nose). MAKE SURE YOU:   Understand these instructions.  Will watch your condition.  Will get help right away if you are not doing well or get worse.   This information is not intended to replace advice given to you by your health care provider. Make sure you discuss any questions  you have with your health care provider.   Document Released: 01/16/2005 Document Revised: 11/06/2012 Document Reviewed: 09/23/2012 Elsevier Interactive Patient Education 2016 Elsevier Inc.  Upper Respiratory Infection, Adult Most upper respiratory infections (URIs) are a viral infection of the air passages leading to the lungs. A URI affects the nose, throat, and upper air passages. The most common type of URI is nasopharyngitis and is typically referred to as "the common cold." URIs run their course and usually go away on their own. Most of the time, a URI does not require medical attention, but sometimes a bacterial infection in the upper airways can follow a viral infection. This is called a secondary infection. Sinus and middle ear infections are common types of secondary upper respiratory infections. Bacterial pneumonia can also complicate a URI. A URI can worsen asthma and chronic obstructive pulmonary disease (COPD). Sometimes, these complications can require emergency medical care and may be life threatening.  CAUSES Almost all URIs are caused by viruses. A virus is a type of germ and can spread from one person to another.  RISKS FACTORS You may be at risk for a URI if:   You smoke.   You have chronic heart or lung disease.  You have a weakened defense (immune) system.   You are very young or very old.   You have nasal allergies or asthma.  You work in crowded or poorly ventilated areas.  You work in health care facilities or schools. SIGNS AND SYMPTOMS  Symptoms typically develop 2-3 days after you come in contact with a cold virus. Most viral URIs last 7-10 days. However, viral URIs from the influenza virus (flu virus) can last 14-18 days and are typically more severe. Symptoms may include:   Runny or stuffy (congested) nose.   Sneezing.   Cough.   Sore throat.   Headache.   Fatigue.   Fever.   Loss of appetite.   Pain in your forehead, behind your  eyes, and over your cheekbones (sinus pain).  Muscle aches.  DIAGNOSIS  Your health care provider may diagnose a URI by:  Physical exam.  Tests to check that your symptoms are not due to another condition such as:  Strep throat.  Sinusitis.  Pneumonia.  Asthma. TREATMENT  A URI goes away on its own with time. It cannot be cured with medicines, but medicines may be prescribed or recommended to relieve symptoms. Medicines may help:  Reduce your fever.  Reduce your cough.  Relieve nasal congestion. HOME CARE INSTRUCTIONS   Take medicines only as directed by your health care provider.   Gargle warm saltwater or take cough drops to comfort your throat as directed by your health care provider.  Use a warm mist humidifier or inhale steam from a shower to increase air moisture. This may make it easier to breathe.  Drink enough fluid to keep your urine clear or pale yellow.   Eat soups and other clear broths and maintain good nutrition.   Rest as needed.   Return to work when your temperature has returned  to normal or as your health care provider advises. You may need to stay home longer to avoid infecting others. You can also use a face mask and careful hand washing to prevent spread of the virus.  Increase the usage of your inhaler if you have asthma.   Do not use any tobacco products, including cigarettes, chewing tobacco, or electronic cigarettes. If you need help quitting, ask your health care provider. PREVENTION  The best way to protect yourself from getting a cold is to practice good hygiene.   Avoid oral or hand contact with people with cold symptoms.   Wash your hands often if contact occurs.  There is no clear evidence that vitamin C, vitamin E, echinacea, or exercise reduces the chance of developing a cold. However, it is always recommended to get plenty of rest, exercise, and practice good nutrition.  SEEK MEDICAL CARE IF:   You are getting worse  rather than better.   Your symptoms are not controlled by medicine.   You have chills.  You have worsening shortness of breath.  You have brown or red mucus.  You have yellow or brown nasal discharge.  You have pain in your face, especially when you bend forward.  You have a fever.  You have swollen neck glands.  You have pain while swallowing.  You have white areas in the back of your throat. SEEK IMMEDIATE MEDICAL CARE IF:   You have severe or persistent:  Headache.  Ear pain.  Sinus pain.  Chest pain.  You have chronic lung disease and any of the following:  Wheezing.  Prolonged cough.  Coughing up blood.  A change in your usual mucus.  You have a stiff neck.  You have changes in your:  Vision.  Hearing.  Thinking.  Mood. MAKE SURE YOU:   Understand these instructions.  Will watch your condition.  Will get help right away if you are not doing well or get worse.   This information is not intended to replace advice given to you by your health care provider. Make sure you discuss any questions you have with your health care provider.   Document Released: 07/12/2000 Document Revised: 06/02/2014 Document Reviewed: 04/23/2013 Elsevier Interactive Patient Education [employer].

## 2015-05-09 ENCOUNTER — Telehealth: Payer: Self-pay | Admitting: *Deleted

## 2015-05-09 LAB — CULTURE, GROUP A STREP (THRC)

## 2015-05-09 NOTE — ED Notes (Signed)
Called patient and informed her that her throat culture came back negative for strep. Patient stated that she was feeling better and would follow up with her PCP if symptoms persisted.

## 2015-07-16 ENCOUNTER — Other Ambulatory Visit: Payer: Self-pay | Admitting: Internal Medicine

## 2015-07-16 DIAGNOSIS — Z1231 Encounter for screening mammogram for malignant neoplasm of breast: Secondary | ICD-10-CM

## 2015-07-30 DIAGNOSIS — Z124 Encounter for screening for malignant neoplasm of cervix: Secondary | ICD-10-CM | POA: Diagnosis not present

## 2015-07-30 DIAGNOSIS — Z01419 Encounter for gynecological examination (general) (routine) without abnormal findings: Secondary | ICD-10-CM | POA: Diagnosis not present

## 2015-08-12 ENCOUNTER — Other Ambulatory Visit: Payer: Self-pay | Admitting: Internal Medicine

## 2015-08-12 ENCOUNTER — Ambulatory Visit
Admission: RE | Admit: 2015-08-12 | Discharge: 2015-08-12 | Disposition: A | Discharge status: Home / Self Care | Payer: PRIVATE HEALTH INSURANCE | Source: Ambulatory Visit | Attending: Internal Medicine | Admitting: Internal Medicine

## 2015-08-12 DIAGNOSIS — Z1231 Encounter for screening mammogram for malignant neoplasm of breast: Secondary | ICD-10-CM | POA: Diagnosis not present

## 2015-12-09 ENCOUNTER — Ambulatory Visit: Payer: Self-pay | Admitting: Physician Assistant

## 2015-12-09 ENCOUNTER — Encounter: Payer: Self-pay | Admitting: Physician Assistant

## 2015-12-09 VITALS — BP 150/74 | HR 80 | Temp 98.1°F

## 2015-12-09 DIAGNOSIS — S39012A Strain of muscle, fascia and tendon of lower back, initial encounter: Secondary | ICD-10-CM

## 2015-12-09 MED ORDER — METHYLPREDNISOLONE 4 MG PO TBPK: ORAL_TABLET | ORAL | 0 refills | Status: DC

## 2015-12-09 MED ORDER — CYCLOBENZAPRINE HCL 10 MG PO TABS: 10.0000 mg | ORAL_TABLET | Freq: Three times a day (TID) | ORAL | 0 refills | Status: DC | PRN

## 2015-12-09 NOTE — Progress Notes (Signed)
S:  C/o low back pain for 3-4 days, + known injury, was pulling a plant out of the ground and felt sharp pain in her lower back, pain is worse with movement, increased with bending over, denies numbness, tingling, or changes in bowel/urinary habits,  Using otc meds without relief Remainder ros neg  O:  Vitals wnl, nad, lungs c t a, cv rrr, spine nontender, muscles in lower back spasmed , decreased rom with bending over,   Neg slr, pt walks without difficulty but does walk slowly, no foot drop noted, n/v intact  A: acute back pain, muscle spasms  P: use wet heat followed by ice, stretches, return to clinic if not better in 3 t 5 days, return earlier if worsening, rx meds: flexeril 10mg  , medrol dose pack

## 2016-07-28 IMAGING — MG 2D DIGITAL SCREENING BILATERAL MAMMOGRAM WITH CAD AND ADJUNCT TO
8 of 12 series · 8 of 28 positions shown · non-contrast
Comparison: Previous exam(s).

CLINICAL DATA: Screening.

EXAM:
2D DIGITAL SCREENING BILATERAL MAMMOGRAM WITH CAD AND ADJUNCT TOMO

[R MLO synth-2D]
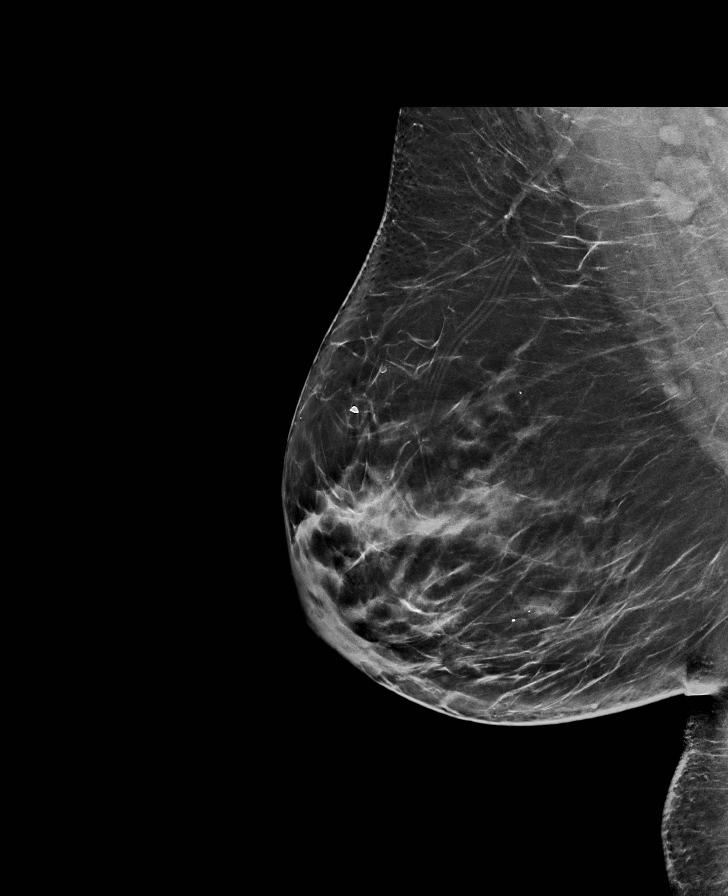

[L CC]
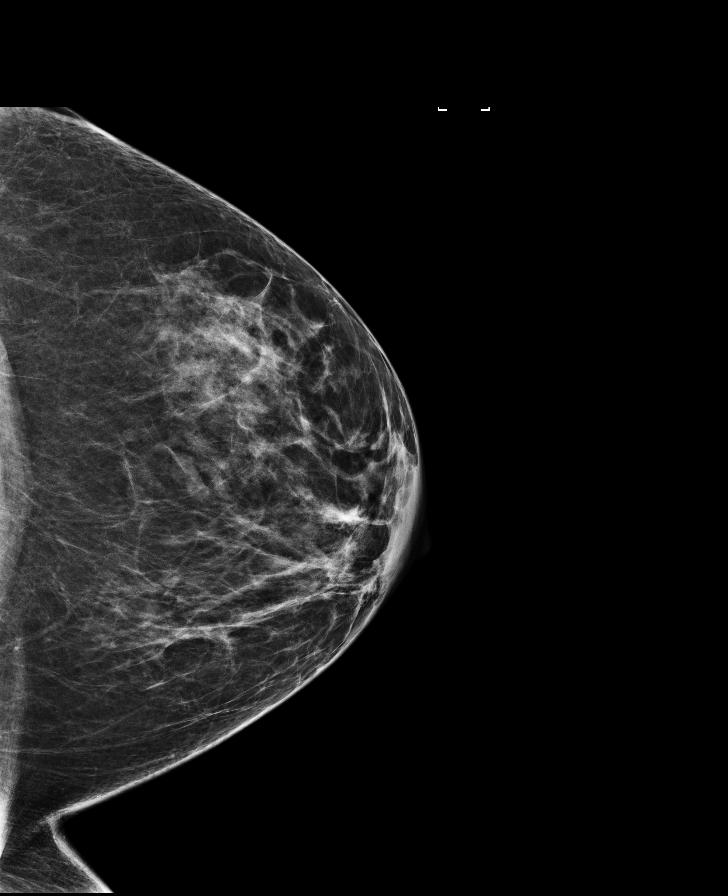

[R MLO]
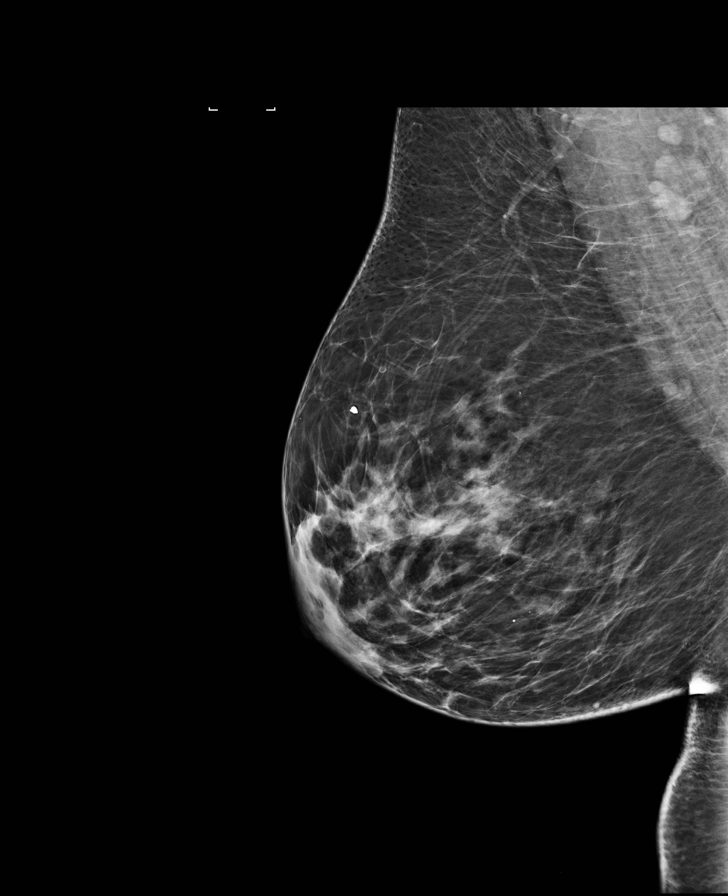

[L MLO]
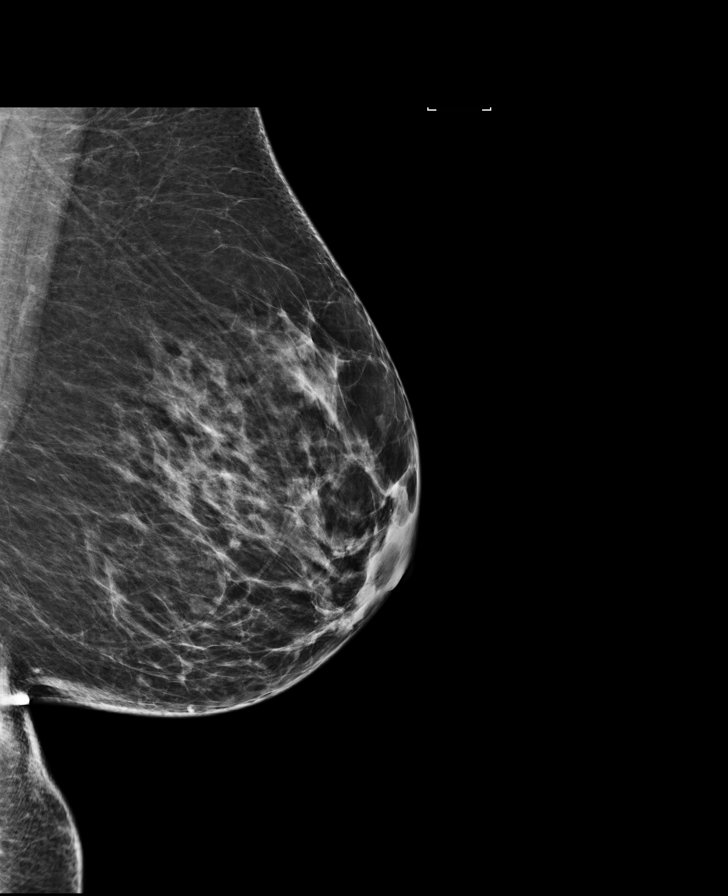

[L CC synth-2D]
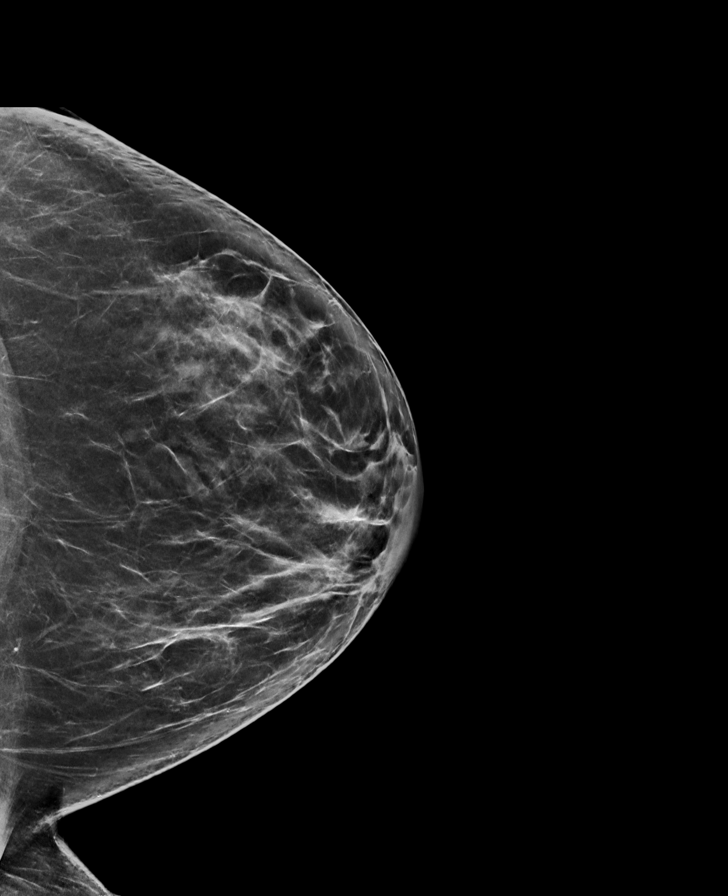

[R CC]
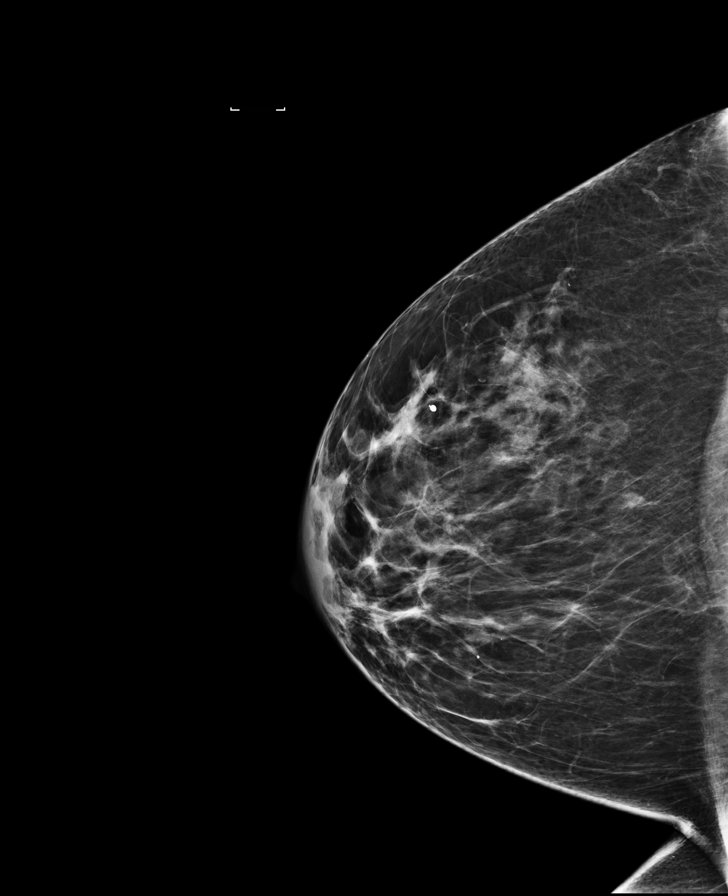

[L MLO synth-2D]
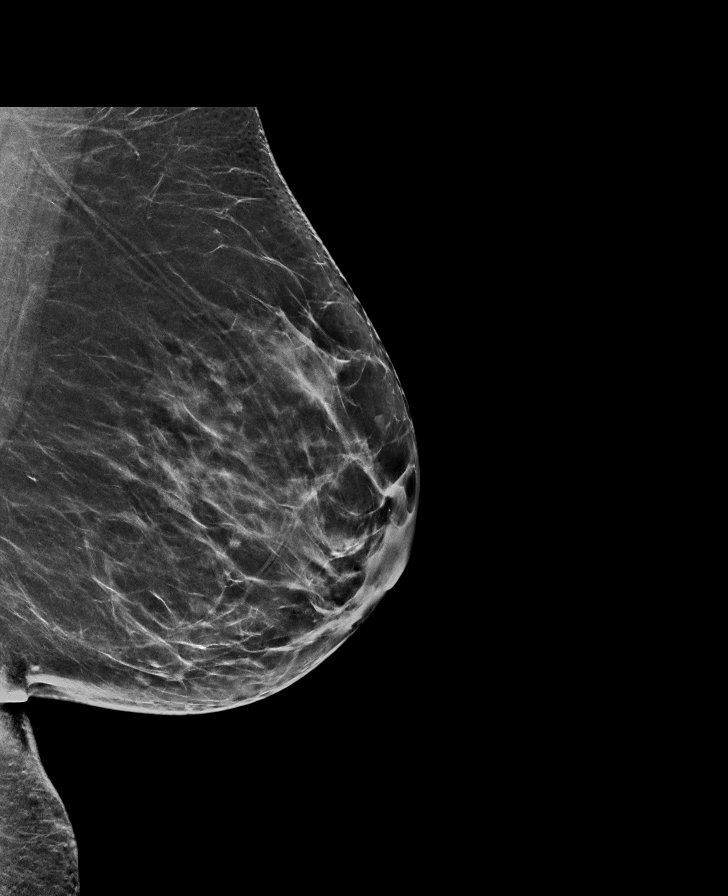

[R CC synth-2D]
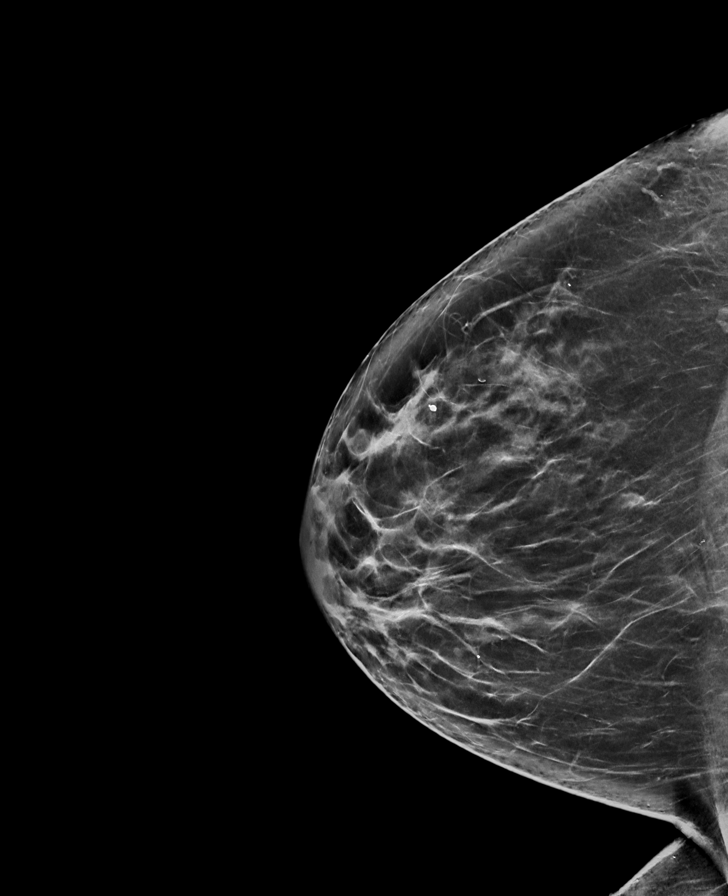

[8 of 28 positions shown; findings below may reference images not displayed]

ACR Breast Density Category b: There are scattered areas of
fibroglandular density.
FINDINGS: There are no findings suspicious for malignancy. Images were
processed with CAD.
IMPRESSION: No mammographic evidence of malignancy. A result letter of this
screening mammogram will be mailed directly to the patient.

RECOMMENDATION:
Screening mammogram in one year. (Code:[id number])

BI-RADS CATEGORY  1: Negative.

## 2016-10-27 ENCOUNTER — Other Ambulatory Visit: Payer: Self-pay | Admitting: Internal Medicine

## 2016-10-27 DIAGNOSIS — Z1231 Encounter for screening mammogram for malignant neoplasm of breast: Secondary | ICD-10-CM

## 2016-11-03 ENCOUNTER — Telehealth: Payer: Self-pay

## 2016-11-03 NOTE — Telephone Encounter (Signed)
Pt and JEG discussed pt's rectocele/cystocele at her last appt.  Pt states it is really really bad now.  She would like to have a ref to Dr. 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  She has called Dr. Elza Rafter office and they suggested she get a ref from [place].  [phone]

## 2016-11-06 ENCOUNTER — Ambulatory Visit
Admission: RE | Admit: 2016-11-06 | Discharge: 2016-11-06 | Disposition: A | Discharge status: Home / Self Care | Payer: PRIVATE HEALTH INSURANCE | Source: Ambulatory Visit | Attending: Internal Medicine | Admitting: Internal Medicine

## 2016-11-06 DIAGNOSIS — Z1231 Encounter for screening mammogram for malignant neoplasm of breast: Secondary | ICD-10-CM | POA: Insufficient documentation

## 2016-11-07 ENCOUNTER — Other Ambulatory Visit: Payer: Self-pay | Admitting: Advanced Practice Midwife

## 2016-11-07 DIAGNOSIS — N8110 Cystocele, unspecified: Secondary | ICD-10-CM

## 2016-11-08 NOTE — Telephone Encounter (Signed)
Pt calling again about ref.  Dr. Elza Rafter # is [phone].  Please let pt know status or if she needs to do anything.

## 2016-11-08 NOTE — Telephone Encounter (Signed)
Patient is scheduled for 11/19 @ 2pm.

## 2016-11-08 NOTE — Telephone Encounter (Signed)
Patient aware of appointment

## 2016-12-18 ENCOUNTER — Telehealth: Payer: Self-pay | Admitting: Advanced Practice Midwife

## 2016-12-18 ENCOUNTER — Ambulatory Visit: Payer: PRIVATE HEALTH INSURANCE | Admitting: Obstetrics and Gynecology

## 2016-12-18 ENCOUNTER — Telehealth: Payer: Self-pay | Admitting: Obstetrics and Gynecology

## 2016-12-18 NOTE — Telephone Encounter (Signed)
Called and spoke with patient cancelling her new patient appointment for today due to the doctor being delayed in surgery. She will call back to reschedule.

## 2016-12-20 ENCOUNTER — Encounter: Payer: PRIVATE HEALTH INSURANCE | Admitting: Advanced Practice Midwife

## 2016-12-25 NOTE — Progress Notes (Signed)
This encounter was created in error - please disregard.

## 2017-01-07 ENCOUNTER — Telehealth: Payer: Self-pay | Admitting: Obstetrics and Gynecology

## 2017-01-07 NOTE — Telephone Encounter (Signed)
dr ref/Female cystocele/[facility]/[place] [facility]/ls  12/18/16 spoke with pt re: dr cx/pt wcb to rs/[place]  01/07/17 lm re: dr cx due to inclement weather/[place]

## 2017-01-08 ENCOUNTER — Ambulatory Visit: Payer: PRIVATE HEALTH INSURANCE | Admitting: Obstetrics and Gynecology

## 2017-01-12 ENCOUNTER — Other Ambulatory Visit: Payer: Self-pay

## 2017-01-12 ENCOUNTER — Encounter: Payer: Self-pay | Admitting: Obstetrics and Gynecology

## 2017-01-12 ENCOUNTER — Ambulatory Visit: Payer: PRIVATE HEALTH INSURANCE | Admitting: Obstetrics and Gynecology

## 2017-01-12 VITALS — BP 138/80 | HR 80 | Resp 18 | Ht 60.5 in | Wt 167.0 lb

## 2017-01-12 DIAGNOSIS — N8111 Cystocele, midline: Secondary | ICD-10-CM | POA: Diagnosis not present

## 2017-01-12 DIAGNOSIS — N814 Uterovaginal prolapse, unspecified: Secondary | ICD-10-CM

## 2017-01-12 NOTE — Patient Instructions (Signed)
Pelvic Organ Prolapse Pelvic organ prolapse is the stretching, bulging, or dropping of pelvic organs into an abnormal position. It happens when the muscles and tissues that surround and support pelvic structures are stretched or weak. Pelvic organ prolapse can involve:  Vagina (vaginal prolapse).  Uterus (uterine prolapse).  Bladder (cystocele).  Rectum (rectocele).  Intestines (enterocele).  When organs other than the vagina are involved, they often bulge into the vagina or protrude from the vagina, depending on how severe the prolapse is. What are the causes? Causes of this condition include:  Pregnancy, labor, and childbirth.  Long-lasting (chronic) cough.  Chronic constipation.  Obesity.  Past pelvic surgery.  Aging. During and after menopause, a decreased production of the hormone estrogen can weaken pelvic ligaments and muscles.  Consistently lifting more than 50 lb (23 kg).  Buildup of fluid in the abdomen due to certain diseases and other conditions.  What are the signs or symptoms? Symptoms of this condition include:  Loss of bladder control when you cough, sneeze, strain, and exercise (stress incontinence). This may be worse immediately following childbirth, and it may gradually improve over time.  Feeling pressure in your pelvis or vagina. This pressure may increase when you cough or when you are having a bowel movement.  A bulge that protrudes from the opening of your vagina or against your vaginal wall. If your uterus protrudes through the opening of your vagina and rubs against your clothing, you may also experience soreness, ulcers, infection, pain, and bleeding.  Increased effort to have a bowel movement or urinate.  Pain in your low back.  Pain, discomfort, or disinterest in sexual intercourse.  Repeated bladder infections (urinary tract infections).  Difficulty inserting or inability to insert a tampon or applicator.  In some people, this  condition does not cause any symptoms. How is this diagnosed? Your health care provider may perform an internal and external vaginal and rectal exam. During the exam, you may be asked to cough and strain while you are lying down, sitting, and standing up. Your health care provider will determine if other tests are required, such as bladder function tests. How is this treated? In most cases, this condition needs to be treated only if it produces symptoms. No treatment is guaranteed to correct the prolapse or relieve the symptoms completely. Treatment may include:  Lifestyle changes, such as: ? Avoiding drinking beverages that contain caffeine. ? Increasing your intake of high-fiber foods. This can help to decrease constipation and straining during bowel movements. ? Emptying your bladder at scheduled times (bladder training therapy). This can help to reduce or avoid urinary incontinence. ? Losing weight if you are overweight or obese.  Estrogen. Estrogen may help mild prolapse by increasing the strength and tone of pelvic floor muscles.  Kegel exercises. These may help mild cases of prolapse by strengthening and tightening the muscles of the pelvic floor.  Pessary insertion. A pessary is a soft, flexible device that is placed into your vagina by your health care provider to help support the vaginal walls and keep pelvic organs in place.  Surgery. This is often the only form of treatment for severe prolapse. Different types of surgeries are available.  Follow these instructions at home:  Wear a sanitary pad or absorbent product if you have urinary incontinence.  Avoid heavy lifting and straining with exercise and work. Do not hold your breath when you perform mild to moderate lifting and exercise activities. Limit your activities as directed by your health care   provider.  Take medicines only as directed by your health care provider.  Perform Kegel exercises as directed by your health care  provider.  If you have a pessary, take care of it as directed by your health care provider. Contact a health care provider if:  Your symptoms interfere with your daily activities or sex life.  You need medicine to help with the discomfort.  You notice bleeding from the vagina that is not related to your period.  You have a fever.  You have pain or bleeding when you urinate.  You have bleeding when you have a bowel movement.  You lose urine when you have sex.  You have chronic constipation.  You have a pessary that falls out.  You have vaginal discharge that has a bad smell.  You have low abdominal pain or cramping that is unusual for you. This information is not intended to replace advice given to you by your health care provider. Make sure you discuss any questions you have with your health care provider. Document Released: 08/13/2013 Document Revised: 06/24/2015 Document Reviewed: 03/31/2013 Elsevier Interactive Patient Education  2018 [employer]. Kegel Exercises Kegel exercises help strengthen the muscles that support the rectum, vagina, small intestine, bladder, and uterus. Doing Kegel exercises can help:  Improve bladder and bowel control.  Improve sexual response.  Reduce problems and discomfort during pregnancy.  Kegel exercises involve squeezing your pelvic floor muscles, which are the same muscles you squeeze when you try to stop the flow of urine. The exercises can be done while sitting, standing, or lying down, but it is best to vary your position. Phase 1 exercises 1. Squeeze your pelvic floor muscles tight. You should feel a tight lift in your rectal area. If you are a female, you should also feel a tightness in your vaginal area. Keep your stomach, buttocks, and legs relaxed. 2. Hold the muscles tight for up to 10 seconds. 3. Relax your muscles. Repeat this exercise 50 times a day or as many times as told by your health care provider. Continue to do this  exercise for at least 4-6 weeks or for as long as told by your health care provider. This information is not intended to replace advice given to you by your health care provider. Make sure you discuss any questions you have with your health care provider. Document Released: 01/03/2012 Document Revised: 09/11/2015 Document Reviewed: 12/06/2014 Elsevier Interactive Patient Education  [employer].

## 2017-01-12 NOTE — Progress Notes (Signed)
66 y.o. G1P1001 MarriedCaucasianF here c/o vaginal prolapse.  The patient was sent for evaluation of a cystocele by Rodolph Bong, CNM.  About 2 years ago her GYN told her she had some prolapse. It didn't start to bother her until about 6 months ago. Now the prolapse is out all of the time. Seem to get worse after she did her bowel prep for a colonoscopy in October. She feels she is walking around with a tampon half out. No bladder c/o or leakage. Normal BM. Not sexually active because of the prolapse.  No vaginal bleeding.  She has a retained IUD, in there for over 20 years. The string broke off a long time ago. She thinks she had an ultrasound that showed the IUD in place.     No LMP recorded. Patient is postmenopausal.          Sexually active: Yes.    The current method of family planning is post menopausal status.    Exercising: No.  The patient does not participate in regular exercise at present. Smoker:  no  Health Maintenance: Pap:  07/2015 WNL per patient  History of abnormal Pap:  no MMG:  11-06-16 WNL  Colonoscopy:  10/2016 Normal per patient  BMD:   Never TDaP:  Up to date  Gardasil: N/A   reports that  has never smoked. she has never used smokeless tobacco. She reports that she drinks about 2.4 - 3.6 oz of alcohol per week. She reports that she does not use drugs. She is an OR nurse at [employer], does mostly eye surgery. Only works part time, no more call. She has a daughter who lives in [place], no grand children.   Past Medical History:  Diagnosis Date  . Arthritis   . Hypertension   . Hypothyroidism   . Sleep apnea   . Vertigo     Past Surgical History:  Procedure Laterality Date  . CARPAL TUNNEL RELEASE Bilateral   . CHOLECYSTECTOMY    . EYE SURGERY    . KNEE ARTHROSCOPY Left   . LACRIMAL DUCT PROBING W/ DACRYOPLASTY Bilateral   . SHOULDER ARTHROSCOPY WITH OPEN ROTATOR CUFF REPAIR Right 09/03/2014   Procedure: SHOULDER ARTHROSCOPY WITH OPEN ROTATOR CUFF REPAIR;   Surgeon: Thornton Park, MD;  Location: [facility];  Service: Orthopedics;  Laterality: Right;  . THYROIDECTOMY Right    hemi-thyroidectomy  1 NSVD, no complications.  Current Outpatient Medications  Medication Sig Dispense Refill  . acetaminophen (TYLENOL) 500 MG tablet Take 1,000 mg by mouth every 6 (six) hours as needed.    . Biotin 10000 MCG TBDP Take by mouth.    . fexofenadine (ALLEGRA) 180 MG tablet Take 180 mg by mouth daily.    Marland Kitchen levothyroxine (SYNTHROID, LEVOTHROID) 125 MCG tablet Take 125 mcg by mouth daily before breakfast.    . lisinopril-hydrochlorothiazide (PRINZIDE,ZESTORETIC) 20-25 MG per tablet Take 1 tablet by mouth daily.    . meloxicam (MOBIC) 15 MG tablet Take by mouth.    . rosuvastatin (CRESTOR) 5 MG tablet Take by mouth.    . vitamin B-12 (CYANOCOBALAMIN) 1000 MCG tablet Take by mouth.     No current facility-administered medications for this visit.     Family History  Problem Relation Age of Onset  . Diabetes type II Father   . CAD Father   . Alzheimer's disease Mother   . Breast cancer Neg Hx     Review of Systems  Constitutional: Negative.   HENT: Negative.   Eyes:  Negative.   Respiratory: Negative.   Cardiovascular: Negative.   Gastrointestinal: Negative.   Endocrine: Negative.   Genitourinary: Negative.        Vaginal prolapse   Musculoskeletal: Negative.   Skin: Negative.   Allergic/Immunologic: Negative.   Neurological: Negative.   Psychiatric/Behavioral: Negative.     Exam:   BP 138/80 (BP Location: Right Arm, Patient Position: Sitting, Cuff Size: Normal)   Pulse 80   Resp 18   Ht 5' 0.5" (1.537 m)   Wt 167 lb (75.8 kg)   BMI 32.08 kg/m   Weight change: @WEIGHTCHANGE @ Height:   Height: 5' 0.5" (153.7 cm)  Ht Readings from Last 3 Encounters:  01/12/17 5' 0.5" (1.537 m)  05/06/15 5\' 1"  (1.549 m)  09/03/14 5\' 1"  (1.549 m)    General appearance: alert, cooperative and appears stated age Head: Normocephalic, without obvious  abnormality, atraumatic Neck: no adenopathy, supple, symmetrical, trachea midline and thyroid normal to inspection and palpation Abdomen: soft, non-tender; non distended,  no masses,  no organomegaly Extremities: extremities normal, atraumatic, no cyanosis or edema Skin: Skin color, texture, turgor normal. No rashes or lesions Neurologic: Grossly normal   Pelvic: External genitalia:  no lesions              Urethra:  normal appearing urethra with no masses, tenderness or lesions              Bartholins and Skenes: normal                 Vagina: normal appearing vagina with normal color and discharge, no lesions              Cervix: no lesions, elongated  Grade 3 uterine prolapse, elongated cervix, grade 1-2 cystocele, grade 1 rectocele               Bimanual Exam:  Uterus:  normal size, contour, position, consistency, mobility, non-tender              Adnexa: no mass, fullness, tenderness               Rectovaginal: Confirms               Anus:  normal sphincter tone, no lesions  She was fitted with a #4 and then #5 ring pessary with support, too small, #6 was uncomfortable. Fitted with #3 cube also too small (fell in the toilet). Fitted with a #4 cube, comfortable in the office. With removal of the #4 irritated the introitus with slight bleeding  Chaperone was present for exam.  A:  Genital prolapse  Retained IUD, we discussed that typically the IUD is removed when it is expired or the patient is menopausal. She has clearly gone a long time without any issues. If she ends up having a hysterectomy, it becomes a non issue. If she doesn't have surgery her options are to remove it or leave it. Long term consequences of leaving the IUD are not clear. There have been reports of uterine infections, also potential risks with removal. Will further discuss once she has had time decide if the pessary is working for her.   P:   Fitted with a #4 cube pessary   Will order the pessary and return to  have it inserted  Long discussion as to prolapse and options for treatment or no treatment  She does need to be able to remove the pessary to be sexually active  Information on prolapse given  If the  pessary doesn't work we discussed options of surgery. I feel she would be a good candidate for TVH, anterior repair and small posterior repair with uterosacral suspension  Spent over 45 minutes face to face with the patient, over 50% in counseling    CC: Rodolph Bong, CNM.  Note sent

## 2017-01-15 ENCOUNTER — Other Ambulatory Visit: Payer: Self-pay

## 2017-01-15 ENCOUNTER — Ambulatory Visit (INDEPENDENT_AMBULATORY_CARE_PROVIDER_SITE_OTHER): Payer: PRIVATE HEALTH INSURANCE | Admitting: Obstetrics and Gynecology

## 2017-01-15 ENCOUNTER — Encounter: Payer: Self-pay | Admitting: Obstetrics and Gynecology

## 2017-01-15 VITALS — BP 120/68 | HR 70 | Resp 16 | Wt 170.0 lb

## 2017-01-15 DIAGNOSIS — N814 Uterovaginal prolapse, unspecified: Secondary | ICD-10-CM

## 2017-01-15 DIAGNOSIS — N8111 Cystocele, midline: Secondary | ICD-10-CM

## 2017-01-15 DIAGNOSIS — Z4689 Encounter for fitting and adjustment of other specified devices: Secondary | ICD-10-CM

## 2017-01-15 NOTE — Progress Notes (Signed)
GYNECOLOGY  VISIT   HPI: 66 y.o.   Married  Caucasian  female   G1P1001 with No LMP recorded. Patient is postmenopausal.   here for pessary insertion. She is back for insertion of a #4 cube pessary, fitted last week.   GYNECOLOGIC HISTORY: No LMP recorded. Patient is postmenopausal. Contraception:postmenopause  Menopausal hormone therapy: none         OB History    Gravida Para Term Preterm AB Living   1 1 1     1    SAB TAB Ectopic Multiple Live Births           1         There are no active problems to display for this patient.   Past Medical History:  Diagnosis Date  . Arthritis   . Hyperlipemia   . Hypertension   . Hypothyroidism   . Sleep apnea   . Vertigo     Past Surgical History:  Procedure Laterality Date  . CARPAL TUNNEL RELEASE Bilateral   . CHOLECYSTECTOMY    . EYE SURGERY    . KNEE ARTHROSCOPY Left   . LACRIMAL DUCT PROBING W/ DACRYOPLASTY Bilateral   . SHOULDER ARTHROSCOPY WITH OPEN ROTATOR CUFF REPAIR Right 09/03/2014   Procedure: SHOULDER ARTHROSCOPY WITH OPEN ROTATOR CUFF REPAIR;  Surgeon: Thornton Park, MD;  Location: [facility];  Service: Orthopedics;  Laterality: Right;  . THYROIDECTOMY Right    hemi-thyroidectomy    Current Outpatient Medications  Medication Sig Dispense Refill  . acetaminophen (TYLENOL) 500 MG tablet Take 1,000 mg by mouth every 6 (six) hours as needed.    . Biotin 10000 MCG TBDP Take by mouth.    . fexofenadine (ALLEGRA) 180 MG tablet Take 180 mg by mouth daily.    Marland Kitchen levothyroxine (SYNTHROID, LEVOTHROID) 125 MCG tablet Take 125 mcg by mouth daily before breakfast.    . lisinopril-hydrochlorothiazide (PRINZIDE,ZESTORETIC) 20-25 MG per tablet Take 1 tablet by mouth daily.    . meloxicam (MOBIC) 15 MG tablet Take by mouth.    . vitamin B-12 (CYANOCOBALAMIN) 1000 MCG tablet Take by mouth.    . rosuvastatin (CRESTOR) 5 MG tablet Take by mouth.     No current facility-administered medications for this visit.      ALLERGIES:  Codeine and Doxycycline  Family History  Problem Relation Age of Onset  . Diabetes type II Father   . CAD Father   . Alzheimer's disease Father   . Alzheimer's disease Mother   . Breast cancer Neg Hx     Social History   Socioeconomic History  . Marital status: Married    Spouse name: Not on file  . Number of children: Not on file  . Years of education: Not on file  . Highest education level: Not on file  Social Needs  . Financial resource strain: Not on file  . Food insecurity - worry: Not on file  . Food insecurity - inability: Not on file  . Transportation needs - medical: Not on file  . Transportation needs - non-medical: Not on file  Occupational History  . Not on file  Tobacco Use  . Smoking status: Never Smoker  . Smokeless tobacco: Never Used  Substance and Sexual Activity  . Alcohol use: Yes    Alcohol/week: 2.4 - 3.6 oz    Types: 4 - 6 Standard drinks or equivalent per week  . Drug use: No  . Sexual activity: Not on file  Other Topics Concern  . Not on  file  Social History Narrative  . Not on file    Review of Systems  Constitutional: Negative.   HENT: Negative.   Eyes: Negative.   Respiratory: Negative.   Cardiovascular: Negative.   Gastrointestinal: Negative.   Genitourinary:       Vaginal prolapse   Musculoskeletal: Negative.   Skin: Negative.   Neurological: Negative.   Endo/Heme/Allergies: Negative.   Psychiatric/Behavioral: Negative.     PHYSICAL EXAMINATION:    BP 120/68 (BP Location: Right Arm, Patient Position: Sitting, Cuff Size: Normal)   Pulse 70   Resp 16   Wt 170 lb (77.1 kg)   BMI 32.65 kg/m     General appearance: alert, cooperative and appears stated age  Pelvic: External genitalia:  no lesions              Urethra:  normal appearing urethra with no masses, tenderness or lesions              Bartholins and Skenes: normal                 #4 cube pessary was placed.  Chaperone was present for  exam.  ASSESSMENT Genital prolapse, fitted with a #4 cube pessary with support Retained IUD, discussed that there are potential risks with leaving it or removing it. Will further discuss options if she ends up being happy with the pessary and doesn't feel she needs surgery    PLAN F/U in 3 days Call with any concerns.    An After Visit Summary was printed and given to the patient.

## 2017-01-16 ENCOUNTER — Telehealth: Payer: Self-pay | Admitting: Obstetrics and Gynecology

## 2017-01-16 NOTE — Telephone Encounter (Signed)
I called the patient on the telephone. She wants to call her GYN tomorrow. Dr. Raphael Gibney

## 2017-01-17 ENCOUNTER — Other Ambulatory Visit: Payer: Self-pay

## 2017-01-17 ENCOUNTER — Ambulatory Visit (INDEPENDENT_AMBULATORY_CARE_PROVIDER_SITE_OTHER): Payer: PRIVATE HEALTH INSURANCE | Admitting: Obstetrics and Gynecology

## 2017-01-17 ENCOUNTER — Encounter: Payer: Self-pay | Admitting: Obstetrics and Gynecology

## 2017-01-17 VITALS — BP 122/78 | HR 80 | Resp 16 | Wt 170.0 lb

## 2017-01-17 DIAGNOSIS — K602 Anal fissure, unspecified: Secondary | ICD-10-CM

## 2017-01-17 DIAGNOSIS — Z4689 Encounter for fitting and adjustment of other specified devices: Secondary | ICD-10-CM | POA: Diagnosis not present

## 2017-01-17 DIAGNOSIS — K5900 Constipation, unspecified: Secondary | ICD-10-CM

## 2017-01-17 MED ORDER — LIDOCAINE 5 % EX OINT: 1.0000 "application " | TOPICAL_OINTMENT | Freq: Four times a day (QID) | CUTANEOUS | 0 refills | Status: DC | PRN

## 2017-01-17 NOTE — Progress Notes (Signed)
GYNECOLOGY  VISIT   HPI: 66 y.o.   Married  Caucasian  female   G1P1001 with No LMP recorded. Patient is postmenopausal.   here c/o unable to have a BM since having the pessary placed.    She had a cube pessary placed 2 days. Prior to that she had 2 normal BM's a day. Since she had the pessary placed she is having pain with attempted BM. She is only having small BM's, having to strain, pain at her rectum, some rectal bleeding. She has been wiping excessively, only getting small amounts of stool with each attempt.  She has never been constipated.   GYNECOLOGIC HISTORY: No LMP recorded. Patient is postmenopausal. Contraception:postmenopause Menopausal hormone therapy: none         OB History    Gravida Para Term Preterm AB Living   1 1 1     1    SAB TAB Ectopic Multiple Live Births           1         There are no active problems to display for this patient.   Past Medical History:  Diagnosis Date  . Arthritis   . Hyperlipemia   . Hypertension   . Hypothyroidism   . Sleep apnea   . Vertigo     Past Surgical History:  Procedure Laterality Date  . CARPAL TUNNEL RELEASE Bilateral   . CHOLECYSTECTOMY    . EYE SURGERY    . KNEE ARTHROSCOPY Left   . LACRIMAL DUCT PROBING W/ DACRYOPLASTY Bilateral   . SHOULDER ARTHROSCOPY WITH OPEN ROTATOR CUFF REPAIR Right 09/03/2014   Procedure: SHOULDER ARTHROSCOPY WITH OPEN ROTATOR CUFF REPAIR;  Surgeon: Thornton Park, MD;  Location: [facility];  Service: Orthopedics;  Laterality: Right;  . THYROIDECTOMY Right    hemi-thyroidectomy    Current Outpatient Medications  Medication Sig Dispense Refill  . acetaminophen (TYLENOL) 500 MG tablet Take 1,000 mg by mouth every 6 (six) hours as needed.    . Biotin 10000 MCG TBDP Take by mouth.    . fexofenadine (ALLEGRA) 180 MG tablet Take 180 mg by mouth daily.    Marland Kitchen levothyroxine (SYNTHROID, LEVOTHROID) 125 MCG tablet Take 125 mcg by mouth daily before breakfast.    . lisinopril-hydrochlorothiazide  (PRINZIDE,ZESTORETIC) 20-25 MG per tablet Take 1 tablet by mouth daily.    . meloxicam (MOBIC) 15 MG tablet Take by mouth.    . vitamin B-12 (CYANOCOBALAMIN) 1000 MCG tablet Take by mouth.    . rosuvastatin (CRESTOR) 5 MG tablet Take by mouth.     No current facility-administered medications for this visit.      ALLERGIES: Codeine and Doxycycline  Family History  Problem Relation Age of Onset  . Diabetes type II Father   . CAD Father   . Alzheimer's disease Father   . Alzheimer's disease Mother   . Breast cancer Neg Hx     Social History   Socioeconomic History  . Marital status: Married    Spouse name: Not on file  . Number of children: Not on file  . Years of education: Not on file  . Highest education level: Not on file  Social Needs  . Financial resource strain: Not on file  . Food insecurity - worry: Not on file  . Food insecurity - inability: Not on file  . Transportation needs - medical: Not on file  . Transportation needs - non-medical: Not on file  Occupational History  . Not on file  Tobacco  Use  . Smoking status: Never Smoker  . Smokeless tobacco: Never Used  Substance and Sexual Activity  . Alcohol use: Yes    Alcohol/week: 2.4 - 3.6 oz    Types: 4 - 6 Standard drinks or equivalent per week  . Drug use: No  . Sexual activity: Not on file  Other Topics Concern  . Not on file  Social History Narrative  . Not on file    Review of Systems  Constitutional: Negative.   HENT: Negative.   Eyes: Negative.   Respiratory: Negative.   Cardiovascular: Negative.   Gastrointestinal: Positive for abdominal pain, blood in stool and constipation.       Painful BM   Genitourinary: Negative.   Musculoskeletal: Negative.   Skin: Negative.   Neurological: Negative.   Endo/Heme/Allergies: Negative.   Psychiatric/Behavioral: Negative.     PHYSICAL EXAMINATION:    BP 122/78 (BP Location: Right Arm, Patient Position: Sitting, Cuff Size: Normal)   Pulse 80    Resp 16   Wt 170 lb (77.1 kg)   BMI 32.65 kg/m     General appearance: alert, cooperative and appears stated age  Pelvic: External genitalia:  no lesions              Urethra:  normal appearing urethra with no masses, tenderness or lesions              Bartholins and Skenes: normal                 Vagina: the pessary was removed and cleaned. Slight vaginal irritation from the pessary, + atrophy.              Cervix: no lesions               Rectal: with the pessary in, no blockage              Anus:  Anal fissure noted at 8 o'clock, + erythema in the peri-anal area  Chaperone was present for exam.  ASSESSMENT Pessary f/u, has been unable to have a normal BM since pessary was placed. No blockage on rectal exam with the pessary in place Anal fissure    PLAN Pessary removed, cleaned and given to the patient She will try metamucil or miralax Lidocaine ointment for pain   An After Visit Summary was printed and given to the patient.  ~15 minutes face to face time of which over 50% was spent in counseling.

## 2017-01-18 ENCOUNTER — Telehealth: Payer: Self-pay | Admitting: *Deleted

## 2017-01-18 ENCOUNTER — Ambulatory Visit: Payer: PRIVATE HEALTH INSURANCE | Admitting: Obstetrics and Gynecology

## 2017-01-18 NOTE — Telephone Encounter (Signed)
Spoke with patient and she is feeling much better. She was able to have a BM last night. Scheduled her for 01-25-17 for follow up for pessary -eh    Salvadore Dom, MD  Nicholes Rough, LPN  Can you please call and check on this patient tomorrow morning.  Thanks,  Sharee Pimple

## 2017-01-25 ENCOUNTER — Encounter: Payer: Self-pay | Admitting: Obstetrics and Gynecology

## 2017-01-25 ENCOUNTER — Other Ambulatory Visit: Payer: Self-pay

## 2017-01-25 ENCOUNTER — Ambulatory Visit: Payer: PRIVATE HEALTH INSURANCE | Admitting: Obstetrics and Gynecology

## 2017-01-25 VITALS — BP 118/66 | HR 78 | Resp 14 | Wt 167.5 lb

## 2017-01-25 DIAGNOSIS — T8332XD Displacement of intrauterine contraceptive device, subsequent encounter: Secondary | ICD-10-CM

## 2017-01-25 DIAGNOSIS — Z4689 Encounter for fitting and adjustment of other specified devices: Secondary | ICD-10-CM

## 2017-01-25 DIAGNOSIS — N814 Uterovaginal prolapse, unspecified: Secondary | ICD-10-CM | POA: Diagnosis not present

## 2017-01-25 DIAGNOSIS — N8111 Cystocele, midline: Secondary | ICD-10-CM | POA: Diagnosis not present

## 2017-01-25 NOTE — Progress Notes (Signed)
GYNECOLOGY  VISIT   HPI: 66 y.o.   Married  Caucasian  female   G1P1001 with No LMP recorded. Patient is postmenopausal.   here for f/u. She has a grade 3 uterine prolapse, 1-2 cystocele and grade 1 rectocele.   Unable to find a ring pessary that would work, fitted with a cube pessary. Last week the pessary was comfortable, but she got constipated was unable to have a BM. After the pessary was removed and she took metamucil she was able to have a BM (which was hard). Currently feeling better. Wants to try the pessary again.    GYNECOLOGIC HISTORY: No LMP recorded. Patient is postmenopausal. Contraception: post menopausal  Menopausal hormone therapy: none         OB History    Gravida Para Term Preterm AB Living   1 1 1     1    SAB TAB Ectopic Multiple Live Births           1         There are no active problems to display for this patient.   Past Medical History:  Diagnosis Date  . Arthritis   . Hyperlipemia   . Hypertension   . Hypothyroidism   . Sleep apnea   . Vertigo     Past Surgical History:  Procedure Laterality Date  . CARPAL TUNNEL RELEASE Bilateral   . CHOLECYSTECTOMY    . EYE SURGERY    . KNEE ARTHROSCOPY Left   . LACRIMAL DUCT PROBING W/ DACRYOPLASTY Bilateral   . SHOULDER ARTHROSCOPY WITH OPEN ROTATOR CUFF REPAIR Right 09/03/2014   Procedure: SHOULDER ARTHROSCOPY WITH OPEN ROTATOR CUFF REPAIR;  Surgeon: Thornton Park, MD;  Location: [facility];  Service: Orthopedics;  Laterality: Right;  . THYROIDECTOMY Right    hemi-thyroidectomy    Current Outpatient Medications  Medication Sig Dispense Refill  . acetaminophen (TYLENOL) 500 MG tablet Take 1,000 mg by mouth every 6 (six) hours as needed.    . Biotin 10000 MCG TBDP Take by mouth.    . fexofenadine (ALLEGRA) 180 MG tablet Take 180 mg by mouth daily.    Marland Kitchen levothyroxine (SYNTHROID, LEVOTHROID) 125 MCG tablet Take 125 mcg by mouth daily before breakfast.    . lidocaine (XYLOCAINE) 5 % ointment Apply 1  application topically 4 (four) times daily as needed. 30 g 0  . lisinopril-hydrochlorothiazide (PRINZIDE,ZESTORETIC) 20-25 MG per tablet Take 1 tablet by mouth daily.    . meloxicam (MOBIC) 15 MG tablet Take by mouth.    . vitamin B-12 (CYANOCOBALAMIN) 1000 MCG tablet Take by mouth.    . rosuvastatin (CRESTOR) 5 MG tablet Take by mouth.     No current facility-administered medications for this visit.      ALLERGIES: Codeine and Doxycycline  Family History  Problem Relation Age of Onset  . Diabetes type II Father   . CAD Father   . Alzheimer's disease Father   . Alzheimer's disease Mother   . Breast cancer Neg Hx     Social History   Socioeconomic History  . Marital status: Married    Spouse name: Not on file  . Number of children: Not on file  . Years of education: Not on file  . Highest education level: Not on file  Social Needs  . Financial resource strain: Not on file  . Food insecurity - worry: Not on file  . Food insecurity - inability: Not on file  . Transportation needs - medical: Not on file  .  Transportation needs - non-medical: Not on file  Occupational History  . Not on file  Tobacco Use  . Smoking status: Never Smoker  . Smokeless tobacco: Never Used  Substance and Sexual Activity  . Alcohol use: Yes    Alcohol/week: 2.4 - 3.6 oz    Types: 4 - 6 Standard drinks or equivalent per week  . Drug use: No  . Sexual activity: Not on file  Other Topics Concern  . Not on file  Social History Narrative  . Not on file    Review of Systems  Constitutional: Negative.   HENT: Negative.   Eyes: Negative.   Respiratory: Negative.   Cardiovascular: Negative.   Gastrointestinal: Negative.   Genitourinary: Negative.   Musculoskeletal: Negative.   Skin: Negative.   Neurological: Negative.   Endo/Heme/Allergies: Negative.   Psychiatric/Behavioral: Negative.   All other systems reviewed and are negative.   PHYSICAL EXAMINATION:    BP 118/66 (BP Location:  Right Arm, Patient Position: Sitting, Cuff Size: Normal)   Pulse 78   Resp 14   Wt 167 lb 8 oz (76 kg)   BMI 32.17 kg/m     General appearance: alert, cooperative and appears stated age  Pelvic: External genitalia:  no lesions              Urethra:  normal appearing urethra with no masses, tenderness or lesions              Bartholins and Skenes: normal                 Vagina: normal appearing vagina with normal color and discharge, no lesions, no irritation              Cervix: no lesions and elongated, 2-3 cm outside of the introitus.    Cube pessary replaced              Chaperone was present for exam.  ASSESSMENT Genital prolapse, pessary removed last week secondary to inability to have a BM. No rectal blockage on exam with the pessary in. The patient was constipated, helped with metamucil. Wants to retry the pessary IUD retained    PLAN Pessary replaced F/U in 1 week, call with any problems We discussed the retained IUD. If the pessary doesn't work, and she has a hysterectomy, there is no need to worry about the IUD. If not, we discussed that there is a small risk of not removing the IUD and small risk involved with removing the IUD. I would recommend using ultrasound guidance and a paracervical block if we decide to remove the IUD.    An After Visit Summary was printed and given to the patient.  ~15 minutes face to face time of which over 50% was spent in counseling.

## 2017-02-01 ENCOUNTER — Encounter: Payer: Self-pay | Admitting: Obstetrics and Gynecology

## 2017-02-01 ENCOUNTER — Other Ambulatory Visit: Payer: Self-pay

## 2017-02-01 ENCOUNTER — Ambulatory Visit: Payer: PRIVATE HEALTH INSURANCE | Admitting: Obstetrics and Gynecology

## 2017-02-01 VITALS — BP 128/80 | HR 72 | Resp 14 | Wt 170.0 lb

## 2017-02-01 DIAGNOSIS — N814 Uterovaginal prolapse, unspecified: Secondary | ICD-10-CM | POA: Diagnosis not present

## 2017-02-01 DIAGNOSIS — N816 Rectocele: Secondary | ICD-10-CM | POA: Diagnosis not present

## 2017-02-01 DIAGNOSIS — N8111 Cystocele, midline: Secondary | ICD-10-CM | POA: Diagnosis not present

## 2017-02-01 NOTE — Progress Notes (Signed)
GYNECOLOGY  VISIT   HPI: 67 y.o.   Married  Caucasian  female   G1P1001 with No LMP recorded. Patient is postmenopausal.   here for follow up uterine prolapse. Patient's pessary fell out X 2 since her last visit.  Couldn't find a ring pessary that worked. She has a #4 cube pessary that was fitted a few weeks ago. After initially being fitted, she had trouble with having a BM. It was removed and then replaced. It only stayed in for 3 hours and came out when she voided.  The patient put the pessary back in, when it fell out again, she left it out.  She is getting frustrated with the pessary.   GYNECOLOGIC HISTORY: No LMP recorded. Patient is postmenopausal. Contraception:postmenopause Menopausal hormone therapy: none        OB History    Gravida Para Term Preterm AB Living   1 1 1     1    SAB TAB Ectopic Multiple Live Births           1         There are no active problems to display for this patient.   Past Medical History:  Diagnosis Date  . Arthritis   . Hyperlipemia   . Hypertension   . Hypothyroidism   . Sleep apnea   . Vertigo     Past Surgical History:  Procedure Laterality Date  . CARPAL TUNNEL RELEASE Bilateral   . CHOLECYSTECTOMY    . EYE SURGERY    . KNEE ARTHROSCOPY Left   . LACRIMAL DUCT PROBING W/ DACRYOPLASTY Bilateral   . SHOULDER ARTHROSCOPY WITH OPEN ROTATOR CUFF REPAIR Right 09/03/2014   Procedure: SHOULDER ARTHROSCOPY WITH OPEN ROTATOR CUFF REPAIR;  Surgeon: Thornton Park, MD;  Location: [facility] ORS;  Service: Orthopedics;  Laterality: Right;  . THYROIDECTOMY Right    hemi-thyroidectomy    Current Outpatient Medications  Medication Sig Dispense Refill  . acetaminophen (TYLENOL) 500 MG tablet Take 1,000 mg by mouth every 6 (six) hours as needed.    . Biotin 10000 MCG TBDP Take by mouth.    . fexofenadine (ALLEGRA) 180 MG tablet Take 180 mg by mouth daily.    Marland Kitchen levothyroxine (SYNTHROID, LEVOTHROID) 125 MCG tablet Take 125 mcg by mouth daily before  breakfast.    . lidocaine (XYLOCAINE) 5 % ointment Apply 1 application topically 4 (four) times daily as needed. 30 g 0  . lisinopril-hydrochlorothiazide (PRINZIDE,ZESTORETIC) 20-25 MG per tablet Take 1 tablet by mouth daily.    . vitamin B-12 (CYANOCOBALAMIN) 1000 MCG tablet Take by mouth.    . rosuvastatin (CRESTOR) 5 MG tablet Take by mouth.     No current facility-administered medications for this visit.      ALLERGIES: Codeine and Doxycycline  Family History  Problem Relation Age of Onset  . Diabetes type II Father   . CAD Father   . Alzheimer's disease Father   . Alzheimer's disease Mother   . Breast cancer Neg Hx     Social History   Socioeconomic History  . Marital status: Married    Spouse name: Not on file  . Number of children: Not on file  . Years of education: Not on file  . Highest education level: Not on file  Social Needs  . Financial resource strain: Not on file  . Food insecurity - worry: Not on file  . Food insecurity - inability: Not on file  . Transportation needs - medical: Not on file  .  Transportation needs - non-medical: Not on file  Occupational History  . Not on file  Tobacco Use  . Smoking status: Never Smoker  . Smokeless tobacco: Never Used  Substance and Sexual Activity  . Alcohol use: Yes    Alcohol/week: 2.4 - 3.6 oz    Types: 4 - 6 Standard drinks or equivalent per week  . Drug use: No  . Sexual activity: Not on file  Other Topics Concern  . Not on file  Social History Narrative  . Not on file    Review of Systems  Constitutional: Negative.   HENT: Negative.   Eyes: Negative.   Respiratory: Negative.   Cardiovascular: Negative.   Gastrointestinal: Negative.   Genitourinary: Negative.   Musculoskeletal: Negative.   Skin: Negative.   Neurological: Negative.   Endo/Heme/Allergies: Negative.   Psychiatric/Behavioral: Negative.     PHYSICAL EXAMINATION:    BP 128/80 (BP Location: Right Arm, Patient Position: Sitting,  Cuff Size: Normal)   Pulse 72   Resp 14   Wt 170 lb (77.1 kg)   BMI 32.65 kg/m     General appearance: alert, cooperative and appears stated age  Pelvic: External genitalia:  no lesions              Urethra:  normal appearing urethra with no masses, tenderness or lesions              Bartholins and Skenes: normal                 Vagina: normal appearing vagina with normal color and discharge, no lesions              Cervix: no lesions              Fitted with a #5 cube pessary, fell in the toilet when she voided.   Patient again examined supine and standing with and without valsalva. At the time of her exam her uterus didn't prolapse, but previously she was noted to have a grade 3 uterine prolapse with an elongated cervix, grade 1-2 cystocele. On today's exam she also had a grade 1-2 rectocele.   Chaperone was present for exam.  ASSESSMENT Genital prolapse, elongated cervix. Appears to have decent apical support.     PLAN We discussed the option of doing nothing, trying a gelhorn pessary or surgery For surgery, we discussed TVH, possible BSO, A&P repair and cystoscopy We discussed the risks of bleeding, infection, damage to nearby organs and the risk of recurrent prolapse We discussed that if she had recurrent prolapse then she may need a sacrocolpopexy We also discussed the possibility of developing GSI after surgical repair. She didn't have any incontinence with the pessary in.  Discussed the risks of surgery. She is leaning toward surgery, she will discuss the plan with her husband and get back to me   An After Visit Summary was printed and given to the patient.  Over 15 minutes face to face time of which over 50% was spent in counseling.

## 2017-02-01 NOTE — Patient Instructions (Signed)
Vaginal Hysterectomy A vaginal hysterectomy is a procedure to remove all or part of the uterus through a small incision in the vagina. In this procedure, your health care provider may remove your entire uterus, including the lower end (cervix). You may need a vaginal hysterectomy to treat:  Uterine fibroids.  A condition that causes the lining of the uterus to grow in other areas (endometriosis).  Problems with pelvic support.  Cancer of the cervix, ovaries, uterus, or tissue that lines the uterus (endometrium).  Excessive (dysfunctional) uterine bleeding.  When removing your uterus, your health care provider may also remove the organs that produce eggs (ovaries) and the tubes that carry eggs to your uterus (fallopian tubes). After a vaginal hysterectomy, you will no longer be able to have a baby. You will also no longer get your menstrual period. Tell a health care provider about:  Any allergies you have.  All medicines you are taking, including vitamins, herbs, eye drops, creams, and over-the-counter medicines.  Any problems you or family members have had with anesthetic medicines.  Any blood disorders you have.  Any surgeries you have had.  Any medical conditions you have.  Whether you are pregnant or may be pregnant. What are the risks? Generally, this is a safe procedure. However, problems may occur, including:  Bleeding.  Infection.  A blood clot that forms in your leg and travels to your lungs (pulmonary embolism).  Damage to surrounding organs.  Pain during sex.  What happens before the procedure?  Ask your health care provider what organs will be removed during surgery.  Ask your health care provider about: ? Changing or stopping your regular medicines. This is especially important if you are taking diabetes medicines or blood thinners. ? Taking medicines such as aspirin and ibuprofen. These medicines can thin your blood. Do not take these medicines before  your procedure if your health care provider instructs you not to.  Follow instructions from your health care provider about eating or drinking restrictions.  Do not use any tobacco products, such as cigarettes, chewing tobacco, and e-cigarettes. If you need help quitting, ask your health care provider.  Plan to have someone take you home after discharge from the hospital. What happens during the procedure?  To reduce your risk of infection: ? Your health care team will wash or sanitize their hands. ? Your skin will be washed with soap.  An IV tube will be inserted into one of your veins.  You may be given antibiotic medicine to help prevent infection.  You will be given one or more of the following: ? A medicine to help you relax (sedative). ? A medicine to numb the area (local anesthetic). ? A medicine to make you fall asleep (general anesthetic). ? A medicine that is injected into an area of your body to numb everything beyond the injection site (regional anesthetic).  Your surgeon will make an incision in your vagina.  Your surgeon will locate and remove all or part of your uterus.  Your ovaries and fallopian tubes may be removed at the same time.  The incision will be closed with stitches (sutures) that dissolve over time. The procedure may vary among health care providers and hospitals. What happens after the procedure?  Your blood pressure, heart rate, breathing rate, and blood oxygen level will be monitored often until the medicines you were given have worn off.  You will be encouraged to get up and walk around after a few hours to help prevent   complications.  You may have IV tubes in place for a few days.  You will be given pain medicine as needed.  Do not drive for 24 hours if you were given a sedative. This information is not intended to replace advice given to you by your health care provider. Make sure you discuss any questions you have with your health care  provider. Document Released: 05/10/2015 Document Revised: 06/24/2015 Document Reviewed: 01/31/2015 Elsevier Interactive Patient Education  2018 [employer]  

## 2017-02-02 ENCOUNTER — Telehealth: Payer: Self-pay | Admitting: Obstetrics and Gynecology

## 2017-02-02 NOTE — Telephone Encounter (Signed)
Patient states she has thought about surgery would like to go ahead and schedule.

## 2017-02-05 NOTE — Telephone Encounter (Signed)
That is correct. TVH/possible BSO, A&P repair, cystoscopy.

## 2017-02-05 NOTE — Telephone Encounter (Signed)
Call to patient. Has decided she is ready to proceed with surgery. Has been dealing with this for over a year and is ready to have surgical intervention.  Discussed possible surgery dates and recovery. 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 Discussed may need up to 6 weeks out of work.  Patient will review date options with husband and employer and call back.  Per OV note, TVH, poss BSO, A&P repair and cysto. Correct?

## 2017-02-06 NOTE — Telephone Encounter (Signed)
Patient called and said 02/19/17 is a good date for her to have surgery. She'd like to proceed with that date, if possible.

## 2017-02-07 NOTE — Telephone Encounter (Signed)
Spoke with patient regarding benefit for surgery. Patient understood and agreeable. Patient has confirmed and is ready to proceed with scheduling. Patient aware this is professional benefit only. Patient aware will be contacted by hospital for separate benefits. Forwarding to Nurse Supervisor for scheduling ° °Routing to Sally Yeakley, RN °

## 2017-02-08 NOTE — Telephone Encounter (Signed)
Call to patient. Discussed surgery dates. Advised 02-19-17 is not available and alternatives offered. Patient agreeable to 03-12-17.  Requests to call back tomorrow to complete scheduling and instructions.

## 2017-02-09 NOTE — Telephone Encounter (Signed)
Call from patient. Advised surgery planned for 03-12-17 at [facility] at 0730. surgery instruction sheet reviewed and printed copy will be provided at consult appointment on 02-22-17.   Routing to provider for final review. Patient agreeable to disposition. Will close encounter.    Marland Kitchen

## 2017-02-16 ENCOUNTER — Other Ambulatory Visit: Payer: Self-pay | Admitting: *Deleted

## 2017-02-16 ENCOUNTER — Telehealth: Payer: Self-pay | Admitting: *Deleted

## 2017-02-16 DIAGNOSIS — Z30431 Encounter for routine checking of intrauterine contraceptive device: Secondary | ICD-10-CM

## 2017-02-16 NOTE — Telephone Encounter (Signed)
Call to [facility] in [place], Janett Billow. Will have to have report pulled from old system and will have it faxed.    Routing to provider for final review. Patient agreeable to disposition. Will close encounter.

## 2017-02-16 NOTE — Telephone Encounter (Signed)
Call to patient. Advised of recommendation for pelvic ultrasound to check previous IUD.  Patient agreeable an advised appointment has been added to currently scheduled appointment on 02-22-17.   Patient states she had ultrasound was done "many years ago" at [facility] in [place] and IUD was fine.  Advised will attempt to get this report.   Appointment 02-22-17 at 9930 for ultrasound and 1000 with Dr Talbert Nan.

## 2017-02-20 ENCOUNTER — Telehealth: Payer: Self-pay | Admitting: *Deleted

## 2017-02-20 NOTE — Telephone Encounter (Signed)
Pelvic ultrasound report from [facility] received and reviewed by Dr Talbert Nan. IUD in place. May cancel pelvic ultrasound.  Call to patient and update provided.   Routing to provider for final review. Patient agreeable to disposition. Will close encounter.

## 2017-02-22 ENCOUNTER — Encounter: Payer: Self-pay | Admitting: Obstetrics and Gynecology

## 2017-02-22 ENCOUNTER — Other Ambulatory Visit: Payer: PRIVATE HEALTH INSURANCE

## 2017-02-22 ENCOUNTER — Ambulatory Visit (INDEPENDENT_AMBULATORY_CARE_PROVIDER_SITE_OTHER): Payer: PRIVATE HEALTH INSURANCE | Admitting: Obstetrics and Gynecology

## 2017-02-22 ENCOUNTER — Other Ambulatory Visit: Payer: Self-pay

## 2017-02-22 VITALS — BP 126/78 | HR 80 | Resp 16 | Wt 169.0 lb

## 2017-02-22 DIAGNOSIS — N814 Uterovaginal prolapse, unspecified: Secondary | ICD-10-CM

## 2017-02-22 DIAGNOSIS — N816 Rectocele: Secondary | ICD-10-CM

## 2017-02-22 DIAGNOSIS — N8111 Cystocele, midline: Secondary | ICD-10-CM

## 2017-02-22 NOTE — Progress Notes (Signed)
GYNECOLOGY  VISIT   HPI: 67 y.o.   Married  Caucasian  female   G1P1001 with No LMP recorded. Patient is postmenopausal.   here for pre surgery consult. Patient is scheduled for a vaginal hysterectomy on  03/23/17 The patient has symptomatic genital prolapse, didn't tolerate pessaries. Desires surgery. No incontinence with or without the pessary.  GYNECOLOGIC HISTORY: No LMP recorded. Patient is postmenopausal. Contraception:postmenopause  Menopausal hormone therapy: none         OB History    Gravida Para Term Preterm AB Living   1 1 1     1    SAB TAB Ectopic Multiple Live Births           1       1 FTNSVD 7 lb 6 oz  There are no active problems to display for this patient.   Past Medical History:  Diagnosis Date  . Arthritis   . Hyperlipemia   . Hypertension   . Hypothyroidism   . Sleep apnea   . Vertigo     Past Surgical History:  Procedure Laterality Date  . CARPAL TUNNEL RELEASE Bilateral   . CHOLECYSTECTOMY    . EYE SURGERY    . KNEE ARTHROSCOPY Left   . LACRIMAL DUCT PROBING W/ DACRYOPLASTY Bilateral   . SHOULDER ARTHROSCOPY WITH OPEN ROTATOR CUFF REPAIR Right 09/03/2014   Procedure: SHOULDER ARTHROSCOPY WITH OPEN ROTATOR CUFF REPAIR;  Surgeon: Thornton Park, MD;  Location: [facility] ORS;  Service: Orthopedics;  Laterality: Right;  . THYROIDECTOMY Right    hemi-thyroidectomy    Current Outpatient Medications  Medication Sig Dispense Refill  . acetaminophen (TYLENOL) 500 MG tablet Take 1,000 mg by mouth every 6 (six) hours as needed.    . Biotin 10000 MCG TBDP Take by mouth.    . fexofenadine (ALLEGRA) 180 MG tablet Take 180 mg by mouth daily.    Marland Kitchen levothyroxine (SYNTHROID, LEVOTHROID) 112 MCG tablet Take 112 mcg by mouth daily before breakfast.    . lisinopril-hydrochlorothiazide (PRINZIDE,ZESTORETIC) 20-25 MG per tablet Take 1 tablet by mouth daily.    . vitamin B-12 (CYANOCOBALAMIN) 1000 MCG tablet Take by mouth.    . rosuvastatin (CRESTOR) 5 MG tablet Take  by mouth.     No current facility-administered medications for this visit.      ALLERGIES: Codeine and Doxycycline. Codeine is upset stomach, doxycycline sun sensitivity.  Family History  Problem Relation Age of Onset  . Diabetes type II Father   . CAD Father   . Alzheimer's disease Father   . Alzheimer's disease Mother   . Breast cancer Neg Hx     Social History   Socioeconomic History  . Marital status: Married    Spouse name: Not on file  . Number of children: Not on file  . Years of education: Not on file  . Highest education level: Not on file  Social Needs  . Financial resource strain: Not on file  . Food insecurity - worry: Not on file  . Food insecurity - inability: Not on file  . Transportation needs - medical: Not on file  . Transportation needs - non-medical: Not on file  Occupational History  . Not on file  Tobacco Use  . Smoking status: Never Smoker  . Smokeless tobacco: Never Used  Substance and Sexual Activity  . Alcohol use: Yes    Alcohol/week: 2.4 - 3.6 oz    Types: 4 - 6 Standard drinks or equivalent per week  . Drug use: No  .  Sexual activity: Not on file  Other Topics Concern  . Not on file  Social History Narrative  . Not on file    Review of Systems  Constitutional: Negative.   HENT: Negative.   Eyes: Negative.   Respiratory: Negative.   Cardiovascular: Negative.   Gastrointestinal: Negative.   Genitourinary: Negative.   Musculoskeletal: Negative.   Skin: Negative.   Neurological: Negative.   Endo/Heme/Allergies: Negative.   Psychiatric/Behavioral: Negative.     PHYSICAL EXAMINATION:    BP 126/78 (BP Location: Right Arm, Patient Position: Sitting, Cuff Size: Normal)   Pulse 80   Resp 16   Wt 169 lb (76.7 kg)   BMI 32.46 kg/m     General appearance: alert, cooperative and appears stated age Neck: no adenopathy, supple, symmetrical, trachea midline and thyroid normal to inspection and palpation Heart: regular rate and  rhythm Lungs: CTAB Abdomen: soft, non-tender; bowel sounds normal; no masses,  no organomegaly Extremities: normal, atraumatic, no cyanosis Skin: normal color, texture and turgor, no rashes or lesions Lymph: normal cervical supraclavicular and inguinal nodes Neurologic: grossly normal   On prior exam patient has had a grade 3 uterine prolapse with an elongated cervix, grade 1-2 cystocele and grade 1-2 rectocele.   Chaperone was present for exam.  Labs reviewed from [facility] in 12/18. Her TSH was suppressed at 6.045 He metabolic panel, lipid panel, CBC were normal.  U/S from prior gyn showed the IUD in her uterine cavity.   ASSESSMENT Genital prolapse, didn't tolerate the pessary, desires surgery.  Hypothyroid, TSH was 0.032 in 01/01/17, repeat testing was also low (don't have the #), she just decreased her synthroid 3 days ago.     PLAN Total vaginal hysterectomy, possible bilateral salpingo-oophorectomy, anterior and posterior colporrhaphy, culdoplasty, cystoscopy Discussed risks, including but not limited to: bleeding, infection, damage to nearby organs (bowel, bladder, blood vessels, ureters), need for further surgery, risk of recurrent prolapse and possible need for prolapse surgery again in the future.  Will get a copy of her most recent TSH and reach out to her primary as to any recommendations given her current TSH and surgery in 2.5 weeks Anesthesia consult.    An After Visit Summary was printed and given to the patient.

## 2017-02-22 NOTE — H&P (Signed)
HPI: 66 y.o.   Married  Caucasian  female   G1P1001 with No LMP recorded. Patient is postmenopausal.   here for pre surgery consult. Patient is scheduled for a vaginal hysterectomy on  03/23/17 The patient has symptomatic genital prolapse, didn't tolerate pessaries. Desires surgery. No incontinence with or without the pessary.  GYNECOLOGIC HISTORY: No LMP recorded. Patient is postmenopausal. Contraception:postmenopause  Menopausal hormone therapy: none         OB History    Gravida Para Term Preterm AB Living   1 1 1     1    SAB TAB Ectopic Multiple Live Births           1       1 FTNSVD 7 lb 6 oz  There are no active problems to display for this patient.   Past Medical History:  Diagnosis Date  . Arthritis   . Hyperlipemia   . Hypertension   . Hypothyroidism   . Sleep apnea   . Vertigo     Past Surgical History:  Procedure Laterality Date  . CARPAL TUNNEL RELEASE Bilateral   . CHOLECYSTECTOMY    . EYE SURGERY    . KNEE ARTHROSCOPY Left   . LACRIMAL DUCT PROBING W/ DACRYOPLASTY Bilateral   . SHOULDER ARTHROSCOPY WITH OPEN ROTATOR CUFF REPAIR Right 09/03/2014   Procedure: SHOULDER ARTHROSCOPY WITH OPEN ROTATOR CUFF REPAIR;  Surgeon: Thornton Park, MD;  Location: [facility] ORS;  Service: Orthopedics;  Laterality: Right;  . THYROIDECTOMY Right    hemi-thyroidectomy    Current Outpatient Medications  Medication Sig Dispense Refill  . acetaminophen (TYLENOL) 500 MG tablet Take 1,000 mg by mouth every 6 (six) hours as needed.    . Biotin 10000 MCG TBDP Take by mouth.    . fexofenadine (ALLEGRA) 180 MG tablet Take 180 mg by mouth daily.    Marland Kitchen levothyroxine (SYNTHROID, LEVOTHROID) 112 MCG tablet Take 112 mcg by mouth daily before breakfast.    . lisinopril-hydrochlorothiazide (PRINZIDE,ZESTORETIC) 20-25 MG per tablet Take 1 tablet by mouth daily.    . vitamin B-12 (CYANOCOBALAMIN) 1000 MCG tablet Take by mouth.    . rosuvastatin (CRESTOR) 5 MG tablet Take by mouth.     No  current facility-administered medications for this visit.      ALLERGIES: Codeine and Doxycycline. Codeine is upset stomach, doxycycline sun sensitivity.  Family History  Problem Relation Age of Onset  . Diabetes type II Father   . CAD Father   . Alzheimer's disease Father   . Alzheimer's disease Mother   . Breast cancer Neg Hx     Social History   Socioeconomic History  . Marital status: Married    Spouse name: Not on file  . Number of children: Not on file  . Years of education: Not on file  . Highest education level: Not on file  Social Needs  . Financial resource strain: Not on file  . Food insecurity - worry: Not on file  . Food insecurity - inability: Not on file  . Transportation needs - medical: Not on file  . Transportation needs - non-medical: Not on file  Occupational History  . Not on file  Tobacco Use  . Smoking status: Never Smoker  . Smokeless tobacco: Never Used  Substance and Sexual Activity  . Alcohol use: Yes    Alcohol/week: 2.4 - 3.6 oz    Types: 4 - 6 Standard drinks or equivalent per week  . Drug use: No  . Sexual  activity: Not on file  Other Topics Concern  . Not on file  Social History Narrative  . Not on file    Review of Systems  Constitutional: Negative.   HENT: Negative.   Eyes: Negative.   Respiratory: Negative.   Cardiovascular: Negative.   Gastrointestinal: Negative.   Genitourinary: Negative.   Musculoskeletal: Negative.   Skin: Negative.   Neurological: Negative.   Endo/Heme/Allergies: Negative.   Psychiatric/Behavioral: Negative.     PHYSICAL EXAMINATION:    BP 126/78 (BP Location: Right Arm, Patient Position: Sitting, Cuff Size: Normal)   Pulse 80   Resp 16   Wt 169 lb (76.7 kg)   BMI 32.46 kg/m     General appearance: alert, cooperative and appears stated age Neck: no adenopathy, supple, symmetrical, trachea midline and thyroid normal to inspection and palpation Heart: regular rate and rhythm Lungs:  CTAB Abdomen: soft, non-tender; bowel sounds normal; no masses,  no organomegaly Extremities: normal, atraumatic, no cyanosis Skin: normal color, texture and turgor, no rashes or lesions Lymph: normal cervical supraclavicular and inguinal nodes Neurologic: grossly normal   On prior exam patient has had a grade 3 uterine prolapse with an elongated cervix, grade 1-2 cystocele and grade 1-2 rectocele.   Chaperone was present for exam.  Labs reviewed from [facility] in 12/18. Her TSH was suppressed at 4.235 He metabolic panel, lipid panel, CBC were normal.  U/S from prior gyn showed the IUD in her uterine cavity.   ASSESSMENT Genital prolapse, didn't tolerate the pessary, desires surgery.  Hypothyroid, TSH was 0.032 in 01/01/17, repeat testing was also low (don't have the #), she just decreased her synthroid 3 days ago.     PLAN Total vaginal hysterectomy, possible bilateral salpingo-oophorectomy, anterior and posterior colporrhaphy, culdoplasty, cystoscopy Discussed risks, including but not limited to: bleeding, infection, damage to nearby organs (bowel, bladder, blood vessels, ureters), need for further surgery, risk of recurrent prolapse and possible need for prolapse surgery again in the future.  Will get a copy of her most recent TSH and reach out to her primary as to any recommendations given her current TSH and surgery in 2.5 weeks Anesthesia consult.    An After Visit Summary was printed and given to the patient.

## 2017-02-23 ENCOUNTER — Telehealth: Payer: Self-pay | Admitting: *Deleted

## 2017-02-23 NOTE — Telephone Encounter (Signed)
Call to Dr Tillman Sers office. Left message with Sharyn Lull. Requesting office note and most recent TSH result. Request recommendation for surgery related to recent changes in thyroid medication.

## 2017-02-26 ENCOUNTER — Other Ambulatory Visit: Payer: Self-pay

## 2017-02-26 ENCOUNTER — Telehealth: Payer: Self-pay | Admitting: Obstetrics and Gynecology

## 2017-02-26 DIAGNOSIS — E039 Hypothyroidism, unspecified: Secondary | ICD-10-CM

## 2017-02-26 NOTE — Telephone Encounter (Signed)
Will redraw blood work with her TSH, FT4&T3 to help make a decision about her upcoming surgery. Once I have those results I will reach out to her primary.

## 2017-02-26 NOTE — Telephone Encounter (Signed)
Call to patient. Advised of instructions from Dr Talbert Nan. Patient agreeable to lab work today. Will go to [facility] draw station in [place].   Encounter closed.

## 2017-02-26 NOTE — Telephone Encounter (Signed)
Patient called back and changed appointment for lab tests to tomorrow at 230. [facility] notified.

## 2017-02-26 NOTE — Telephone Encounter (Signed)
Please let the patient know that I got a copy of her last TSH. It 0.013, down from 0.032. We need to get a Free T4 and T3, I would probably add another TSH as well(it has been a few weeks since her synthroid was adjusted). If she has normal T4 and T3, she is probably okay proceeding with surgery, if not we will need to wait until her numbers normalize (increased risk of a fib) Can you try and get this blood work in the next day or so, her surgery is in 2 weeks).

## 2017-02-28 LAB — T4, FREE: FREE T4: 1.44 ng/dL (ref 0.82–1.77)

## 2017-02-28 LAB — TSH: TSH: 0.041 u[IU]/mL — ABNORMAL LOW (ref 0.450–4.500)

## 2017-02-28 LAB — T3: T3, Total: 93 ng/dL (ref 71–180)

## 2017-03-02 NOTE — Patient Instructions (Addendum)
Jordan Palmer  03/02/2017      Your procedure is scheduled on 03-12-17   Report to [facility]  At  5:30 A.M.  Call this number if you have problems the morning of surgery:[phone]             OUR [address]ESS IS [address] , WE ARE LOCATED IN [place].    Remember:  Do not eat food or drink liquids after midnight.  Take these medicines the morning of surgery with A SIP OF WATER: Levothyroxine   Do not wear jewelry, make-up or nail polish.  Do not wear lotions, powders, or perfumes, or deoderant.  Do not shave 48 hours prior to surgery.  Men may shave face and neck.  Do not bring valuables to the hospital.  [facility] is not responsible for any belongings or valuables.  Contacts, dentures or bridgework may not be worn into surgery.  Leave your suitcase in the car.  After surgery it may be brought to your room.  For patients admitted to the hospital, discharge time will be determined by your treatment team.   Special instructions:  Please bring your mask and tubing for your CPAP machine, as well as any medication that you will need in the original pill bottle.  Please read over the following fact sheets that you were given.    [facility] - Preparing for Surgery Before surgery, you can play an important role.  Because skin is not sterile, your skin needs to be as free of germs as possible.  You can reduce the number of germs on your skin by washing with CHG (chlorahexidine gluconate) soap before surgery.  CHG is an antiseptic cleaner which kills germs and bonds with the skin to continue killing germs even after washing. Please DO NOT use if you have an allergy to CHG or antibacterial soaps.  If your skin becomes reddened/irritated stop using the CHG and inform your nurse when you arrive at Short Stay. Do not shave (including legs and underarms) for at least 48 hours prior to the first CHG shower.  You may shave your  face/neck. Please follow these instructions carefully:  1.  Shower with CHG Soap the night before surgery and the  morning of Surgery.  2.  If you choose to wash your hair, wash your hair first as usual with your  normal  shampoo.  3.  After you shampoo, rinse your hair and body thoroughly to remove the  shampoo.                           4.  Use CHG as you would any other liquid soap.  You can apply chg directly  to the skin and wash                       Gently with a scrungie or clean washcloth.  5.  Apply the CHG Soap to your body ONLY FROM THE NECK DOWN.   Do not use on face/ open                           Wound or open sores. Avoid contact with eyes, ears mouth and genitals (private parts).  Wash face,  Genitals (private parts) with your normal soap.             6.  Wash thoroughly, paying special attention to the area where your surgery  will be performed.  7.  Thoroughly rinse your body with warm water from the neck down.  8.  DO NOT shower/wash with your normal soap after using and rinsing off  the CHG Soap.                9.  Pat yourself dry with a clean towel.            10.  Wear clean pajamas.            11.  Place clean sheets on your bed the night of your first shower and do not  sleep with pets. Day of Surgery : Do not apply any lotions/deodorants the morning of surgery.  Please wear clean clothes to the hospital/surgery center.  FAILURE TO FOLLOW THESE INSTRUCTIONS MAY RESULT IN THE CANCELLATION OF YOUR SURGERY PATIENT SIGNATURE_________________________________  NURSE SIGNATURE__________________________________  ________________________________________________________________________

## 2017-03-05 ENCOUNTER — Encounter (HOSPITAL_COMMUNITY): Payer: Self-pay

## 2017-03-05 ENCOUNTER — Encounter (HOSPITAL_COMMUNITY)
Admission: RE | Admit: 2017-03-05 | Discharge: 2017-03-05 | Disposition: A | Discharge status: Home / Self Care | Payer: PRIVATE HEALTH INSURANCE | Source: Ambulatory Visit | Attending: Obstetrics and Gynecology | Admitting: Obstetrics and Gynecology

## 2017-03-05 ENCOUNTER — Other Ambulatory Visit: Payer: Self-pay

## 2017-03-05 DIAGNOSIS — Z01818 Encounter for other preprocedural examination: Secondary | ICD-10-CM | POA: Diagnosis present

## 2017-03-05 DIAGNOSIS — I10 Essential (primary) hypertension: Secondary | ICD-10-CM | POA: Insufficient documentation

## 2017-03-05 DIAGNOSIS — N819 Female genital prolapse, unspecified: Secondary | ICD-10-CM | POA: Diagnosis not present

## 2017-03-05 DIAGNOSIS — R9431 Abnormal electrocardiogram [ECG] [EKG]: Secondary | ICD-10-CM | POA: Insufficient documentation

## 2017-03-05 LAB — COMPREHENSIVE METABOLIC PANEL
ALT: 22 U/L (ref 14–54)
ANION GAP: 10 (ref 5–15)
AST: 25 U/L (ref 15–41)
Albumin: 4.4 g/dL (ref 3.5–5.0)
Alkaline Phosphatase: 53 U/L (ref 38–126)
BUN: 17 mg/dL (ref 6–20)
CALCIUM: 9.4 mg/dL (ref 8.9–10.3)
CHLORIDE: 100 mmol/L — AB (ref 101–111)
CO2: 26 mmol/L (ref 22–32)
CREATININE: 0.85 mg/dL (ref 0.44–1.00)
Glucose, Bld: 99 mg/dL (ref 65–99)
Potassium: 4 mmol/L (ref 3.5–5.1)
SODIUM: 136 mmol/L (ref 135–145)
Total Bilirubin: 0.3 mg/dL (ref 0.3–1.2)
Total Protein: 7.9 g/dL (ref 6.5–8.1)

## 2017-03-05 LAB — CBC
HCT: 36.8 % (ref 36.0–46.0)
Hemoglobin: 12.6 g/dL (ref 12.0–15.0)
MCH: 31 pg (ref 26.0–34.0)
MCHC: 34.2 g/dL (ref 30.0–36.0)
MCV: 90.6 fL (ref 78.0–100.0)
PLATELETS: 283 10*3/uL (ref 150–400)
RBC: 4.06 MIL/uL (ref 3.87–5.11)
RDW: 13.3 % (ref 11.5–15.5)
WBC: 8.2 10*3/uL (ref 4.0–10.5)

## 2017-03-05 LAB — TSH: TSH: 0.087 u[IU]/mL — AB (ref 0.350–4.500)

## 2017-03-05 LAB — ABO/RH: ABO/RH(D): A NEG

## 2017-03-05 NOTE — Progress Notes (Signed)
03-05-17 TSH results routed to Dr. Talbert Nan for review.

## 2017-03-06 NOTE — Progress Notes (Signed)
Consulted Dr. Jillyn Hidden, [facility] face to face about patient's EKG done 03/05/2017. Dr. Talbert Nan wanted Anesthesia to review EKG for her surgery on 03/12/2017. Per Dr. Jillyn Hidden, [facility], patient needs a Stress test and to have surgery cancelled for 03/12/2017. Verline Lema, RN at Dr. Gentry Fitz office notified and EKG with recommendations  Written on EKG.EKG Faxed to Dr. Talbert Nan.

## 2017-03-07 ENCOUNTER — Telehealth: Payer: Self-pay

## 2017-03-07 ENCOUNTER — Telehealth: Payer: Self-pay | Admitting: Obstetrics and Gynecology

## 2017-03-07 DIAGNOSIS — R9431 Abnormal electrocardiogram [ECG] [EKG]: Secondary | ICD-10-CM

## 2017-03-07 NOTE — Telephone Encounter (Signed)
Patient is returning a call to [place]. Patient said she was told to ask for her nurse.

## 2017-03-07 NOTE — Telephone Encounter (Signed)
Spoke with patient. Advised of message as seen below from [place]. Patient verbalizes understanding. Advised of appointment with [facility] in [facility] tomorrow at 10 am. Patient is agreeable to date and time.Advised will keep surgery scheduled at this time, but this will be determined on how quickly additional testing can be performed before surgery. Patient is agreeable.

## 2017-03-07 NOTE — Telephone Encounter (Signed)
I called the patient and left her a message (per her DPR) that she had some changes on her EKG and will need a stress test prior to surgery. Told her we were trying to get that done this week. She has an appointment tomorrow with Cardiology. Requested she call back and speak with myself or [facility]

## 2017-03-07 NOTE — Telephone Encounter (Signed)
Patient's EKG reviewed by anesthesiologist Dr.Hatchett who states that patient cannot have surgery at this time. Needs additional testing with a stress test before proceeding due to poor R wave progression, increased lipids, age, and hypertension. All place her at risk for anterior ischemia.   Reviewed with Dr.Jerston. Referral to [place] needed. Referral placed.  Call to [facility]. Appointment scheduled for patient with Dr.Renvankar on 03/08/2017 at 10 am. [facility] at [address], [place], [place] [address]  Routing to [place] to review with patient per request.

## 2017-03-07 NOTE — Telephone Encounter (Signed)
Please see telephone encounter dated from earlier today. Encounter closed.

## 2017-03-08 ENCOUNTER — Encounter: Payer: Self-pay | Admitting: Cardiology

## 2017-03-08 ENCOUNTER — Ambulatory Visit (INDEPENDENT_AMBULATORY_CARE_PROVIDER_SITE_OTHER): Payer: PRIVATE HEALTH INSURANCE | Admitting: Cardiology

## 2017-03-08 VITALS — BP 128/72 | HR 70 | Ht 61.0 in | Wt 168.0 lb

## 2017-03-08 DIAGNOSIS — Z01818 Encounter for other preprocedural examination: Secondary | ICD-10-CM | POA: Diagnosis not present

## 2017-03-08 DIAGNOSIS — I10 Essential (primary) hypertension: Secondary | ICD-10-CM

## 2017-03-08 DIAGNOSIS — E782 Mixed hyperlipidemia: Secondary | ICD-10-CM

## 2017-03-08 DIAGNOSIS — R9431 Abnormal electrocardiogram [ECG] [EKG]: Secondary | ICD-10-CM | POA: Diagnosis not present

## 2017-03-08 DIAGNOSIS — Z01810 Encounter for preprocedural cardiovascular examination: Secondary | ICD-10-CM | POA: Insufficient documentation

## 2017-03-08 NOTE — Patient Instructions (Signed)
Medication Instructions:  Your physician recommends that you continue on your current medications as directed. Please refer to the Current Medication list given to you today.  Labwork: None  Testing/Procedures: Your physician has requested that you have a stress echocardiogram. For further information please visit [email or web address]. Please follow instruction sheet as given.  Follow-Up: Your physician recommends that you schedule a follow-up appointment in: Call or return to clinic prn if these symptoms worsen or fail to improve as anticipated.  Any Other Special Instructions Will Be Listed Below (If Applicable).     If you need a refill on your cardiac medications before your next appointment, please call your pharmacy.   [place], RN, BSN

## 2017-03-08 NOTE — Progress Notes (Signed)
Cardiology Office Note:    Date:  03/08/2017   ID:  CARRON MCMURRY, DOB 02-02-1950, MRN [id number]  PCP:  Baxter Hire, MD  Cardiologist:  Jenean Lindau, MD   Referring MD: Baxter Hire, MD    ASSESSMENT:    1. Pre-operative clearance   2. Essential hypertension   3. Mixed dyslipidemia   4. Abnormal EKG   5. Preoperative cardiovascular examination    PLAN:    In order of problems listed above:  1. Risk stratification process was discussed with the patient at extensive length.  She has multiple risk factors for coronary artery disease.  She is a very active lady but does not exercise on a regular basis.  In view of the above and the abnormal EKG I have scheduled her to get a exercise stress echo to get an objective evidence of her risks and effort tolerance.  If this is negative, she is not at high risk for coronary events during the aforementioned surgery.  Meticulous hemodynamic monitoring will further reduce the risk of coronary events.  Diet was discussed in terms of hypertension and dyslipidemia counseling and she vocalized understanding.  She will be seen in follow-up appointment on a as needed basis.   Medication Adjustments/Labs and Tests Ordered: Current medicines are reviewed at length with the patient today.  Concerns regarding medicines are outlined above.  Orders Placed This Encounter  Procedures  . ECHOCARDIOGRAM STRESS TEST   No orders of the defined types were placed in this encounter.    History of Present Illness:    Jordan Palmer is a 67 y.o. female who is being seen today for the evaluation of patient is a pleasant 67 year old female.  She has past medical history of essential hypertension and dyslipidemia.  She is a Marine scientist by profession.  She leads a sedentary lifestyle.  She is referred here by her primary care physician or another by her anesthesia Dr. is it EKG was normal and she has multiple risk factors for coronary artery disease.  She denies  any chest pain orthopnea or PND.  Again she leads a sedentary lifestyle.  At the time of my evaluation, the patient is alert awake oriented and in no distress.  Past Medical History:  Diagnosis Date  . Arthritis   . Hyperlipemia   . Hypertension   . Hypothyroidism   . Sleep apnea   . Vertigo     Past Surgical History:  Procedure Laterality Date  . CARPAL TUNNEL RELEASE Bilateral   . CHOLECYSTECTOMY    . EYE SURGERY    . KNEE ARTHROSCOPY Left   . LACRIMAL DUCT PROBING W/ DACRYOPLASTY Bilateral   . SHOULDER ARTHROSCOPY WITH OPEN ROTATOR CUFF REPAIR Right 09/03/2014   Procedure: SHOULDER ARTHROSCOPY WITH OPEN ROTATOR CUFF REPAIR;  Surgeon: Thornton Park, MD;  Location: [facility] ORS;  Service: Orthopedics;  Laterality: Right;  . THYROIDECTOMY Right    hemi-thyroidectomy    Current Medications: Current Meds  Medication Sig  . acetaminophen (TYLENOL) 500 MG tablet Take 1,000 mg by mouth every 6 (six) hours as needed.  . Biotin 10000 MCG TBDP Take by mouth.  . fexofenadine (ALLEGRA) 180 MG tablet Take 180 mg by mouth daily.   Marland Kitchen levothyroxine (SYNTHROID, LEVOTHROID) 112 MCG tablet Take 112 mcg by mouth daily before breakfast.  . lisinopril-hydrochlorothiazide (PRINZIDE,ZESTORETIC) 20-25 MG per tablet Take 1 tablet by mouth daily.  . vitamin B-12 (CYANOCOBALAMIN) 1000 MCG tablet Take by mouth.     Allergies:  Codeine and Doxycycline   Social History   Socioeconomic History  . Marital status: Married    Spouse name: None  . Number of children: None  . Years of education: None  . Highest education level: None  Social Needs  . Financial resource strain: None  . Food insecurity - worry: None  . Food insecurity - inability: None  . Transportation needs - medical: None  . Transportation needs - non-medical: None  Occupational History  . None  Tobacco Use  . Smoking status: Never Smoker  . Smokeless tobacco: Never Used  Substance and Sexual Activity  . Alcohol use: Yes     Alcohol/week: 2.4 - 3.6 oz    Types: 4 - 6 Standard drinks or equivalent per week    Comment: Occ  . Drug use: No  . Sexual activity: None  Other Topics Concern  . None  Social History Narrative  . None     Family History: The patient's family history includes Alzheimer's disease in her father and mother; CAD in her father; Diabetes type II in her father. There is no history of Breast cancer.  ROS:   Please see the history of present illness.    All other systems reviewed and are negative.  EKGs/Labs/Other Studies Reviewed:    The following studies were reviewed today: EKG done revealed sinus rhythm, poor anterior forces and nonspecific ST-T changes.   Recent Labs: 03/05/2017: ALT 22; BUN 17; Creatinine, Ser 0.85; Hemoglobin 12.6; Platelets 283; Potassium 4.0; Sodium 136; TSH 0.087  Recent Lipid Panel No results found for: CHOL, TRIG, HDL, CHOLHDL, VLDL, LDLCALC, LDLDIRECT  Physical Exam:    VS:  BP 128/72 (BP Location: Right Arm, Patient Position: Sitting, Cuff Size: Normal)   Pulse 70   Ht 5\' 1"  (1.549 m)   Wt 168 lb (76.2 kg)   SpO2 98%   BMI 31.74 kg/m     Wt Readings from Last 3 Encounters:  03/08/17 168 lb (76.2 kg)  03/05/17 168 lb 2 oz (76.3 kg)  02/22/17 169 lb (76.7 kg)     GEN: Patient is in no acute distress HEENT: Normal NECK: No JVD; No carotid bruits LYMPHATICS: No lymphadenopathy CARDIAC: S1 S2 regular, 2/6 systolic murmur at the apex. RESPIRATORY:  Clear to auscultation without rales, wheezing or rhonchi  ABDOMEN: Soft, non-tender, non-distended MUSCULOSKELETAL:  No edema; No deformity  SKIN: Warm and dry NEUROLOGIC:  Alert and oriented x 3 PSYCHIATRIC:  Normal affect    Signed, Jenean Lindau, MD  03/08/2017 10:56 AM    [name]

## 2017-03-08 NOTE — Addendum Note (Signed)
Addended by: Tarri Glenn on: 03/08/2017 11:13 AM   Modules accepted: Orders

## 2017-03-09 ENCOUNTER — Other Ambulatory Visit: Payer: Self-pay

## 2017-03-09 ENCOUNTER — Telehealth: Payer: Self-pay

## 2017-03-09 ENCOUNTER — Telehealth: Payer: Self-pay | Admitting: Obstetrics and Gynecology

## 2017-03-09 DIAGNOSIS — Z01818 Encounter for other preprocedural examination: Secondary | ICD-10-CM

## 2017-03-09 DIAGNOSIS — R9431 Abnormal electrocardiogram [ECG] [EKG]: Secondary | ICD-10-CM | POA: Diagnosis not present

## 2017-03-09 NOTE — Telephone Encounter (Signed)
Spoke with patient. Advised I spoke with Jordan Palmer in readmissions who states that she spoke with Caryl Pina at [facility] office who states the patient is cleared for surgery. Jordan Palmer is going to place the results of the stress test with a note from [place] that patient is cleared on the patient's chart for surgery that way anesthesia will have access to this on Monday. Patient verbalizes understanding.  Routing to provider for final review. Patient agreeable to disposition. Will close encounter.

## 2017-03-09 NOTE — Telephone Encounter (Signed)
Spoke with Jordan Palmer in readmissions who states that she spoke with Caryl Pina at [facility] office who states the patient is cleared for surgery. Jordan Palmer is going to place the results of the stress test with a note from [place] that patient is cleared on the patient's chart for surgery that way anesthesia will have access to this on Monday.   Routing to provider for final review. Patient agreeable to disposition. Will close encounter.

## 2017-03-09 NOTE — Progress Notes (Signed)
03/09/2017-Stress test from [facility] on chart.  03/08/2017-Last office visit with Dr. Geraldo Pitter on chart and in Epic with Cardiac clearance.  Jordan Lema, RN at Dr. Gentry Fitz office called and informed that Stress test from [facility] will be on patient's chart for her to review and also last office note from Dr. Geraldo Pitter with clearance from 03/08/2017 on chart.

## 2017-03-09 NOTE — Telephone Encounter (Signed)
Great, will you please make sure this gets to anesthesia. Thanks!

## 2017-03-09 NOTE — Telephone Encounter (Signed)
Patient is calling to make sure everything is ok for surgery on Monday. She had a stress test and ekg today.

## 2017-03-09 NOTE — Telephone Encounter (Signed)
Spoke with patient who states that she had her stress test this morning which was normal. Reports this is going to be faxed to cardiologist Dr.Revankar and his office will reach out about clearance for surgery.

## 2017-03-11 NOTE — Anesthesia Preprocedure Evaluation (Addendum)
Anesthesia Evaluation  Patient identified by MRN, date of birth, ID band Patient awake    Reviewed: Allergy & Precautions, NPO status , Patient's Chart, lab work & pertinent test results  History of Anesthesia Complications Negative for: history of anesthetic complications  Airway Mallampati: II  TM Distance: >3 FB Neck ROM: Full    Dental  (+) Dental Advisory Given   Pulmonary sleep apnea and Continuous Positive Airway Pressure Ventilation ,    breath sounds clear to auscultation       Cardiovascular hypertension, Pt. on medications (-) angina Rhythm:Regular Rate:Normal  03/09/17 stress test: normal perfusion, EF 55-60%   Neuro/Psych negative neurological ROS     GI/Hepatic negative GI ROS, Neg liver ROS,   Endo/Other  Hypothyroidism Morbid obesity  Renal/GU negative Renal ROS     Musculoskeletal   Abdominal (+) + obese,   Peds  Hematology negative hematology ROS (+)   Anesthesia Other Findings   Reproductive/Obstetrics                           Anesthesia Physical Anesthesia Plan  ASA: III  Anesthesia Plan: General   Post-op Pain Management:    Induction: Intravenous  PONV Risk Score and Plan: 4 or greater and Scopolamine patch - Pre-op, Dexamethasone and Ondansetron  Airway Management Planned: Oral ETT  Additional Equipment:   Intra-op Plan:   Post-operative Plan: Extubation in OR  Informed Consent: I have reviewed the patients History and Physical, chart, labs and discussed the procedure including the risks, benefits and alternatives for the proposed anesthesia with the patient or authorized representative who has indicated his/her understanding and acceptance.   Dental advisory given  Plan Discussed with: CRNA and Surgeon  Anesthesia Plan Comments: (Plan routine monitors, GETA)        Anesthesia Quick Evaluation

## 2017-03-12 ENCOUNTER — Encounter (HOSPITAL_BASED_OUTPATIENT_CLINIC_OR_DEPARTMENT_OTHER)
Admission: RE | Disposition: A | Discharge status: Home / Self Care | Payer: Self-pay | Source: Ambulatory Visit | Attending: Obstetrics and Gynecology

## 2017-03-12 ENCOUNTER — Other Ambulatory Visit: Payer: Self-pay

## 2017-03-12 ENCOUNTER — Ambulatory Visit (HOSPITAL_BASED_OUTPATIENT_CLINIC_OR_DEPARTMENT_OTHER): Payer: PRIVATE HEALTH INSURANCE | Admitting: Anesthesiology

## 2017-03-12 ENCOUNTER — Ambulatory Visit (HOSPITAL_BASED_OUTPATIENT_CLINIC_OR_DEPARTMENT_OTHER)
Admission: RE | Admit: 2017-03-12 | Discharge: 2017-03-13 | Disposition: A | Discharge status: Home / Self Care | Payer: PRIVATE HEALTH INSURANCE | Source: Ambulatory Visit | Attending: Obstetrics and Gynecology | Admitting: Obstetrics and Gynecology

## 2017-03-12 ENCOUNTER — Encounter (HOSPITAL_BASED_OUTPATIENT_CLINIC_OR_DEPARTMENT_OTHER): Payer: Self-pay | Admitting: *Deleted

## 2017-03-12 DIAGNOSIS — Z8249 Family history of ischemic heart disease and other diseases of the circulatory system: Secondary | ICD-10-CM | POA: Diagnosis not present

## 2017-03-12 DIAGNOSIS — Z885 Allergy status to narcotic agent status: Secondary | ICD-10-CM | POA: Insufficient documentation

## 2017-03-12 DIAGNOSIS — N9489 Other specified conditions associated with female genital organs and menstrual cycle: Secondary | ICD-10-CM | POA: Diagnosis not present

## 2017-03-12 DIAGNOSIS — E890 Postprocedural hypothyroidism: Secondary | ICD-10-CM | POA: Insufficient documentation

## 2017-03-12 DIAGNOSIS — Z881 Allergy status to other antibiotic agents status: Secondary | ICD-10-CM | POA: Insufficient documentation

## 2017-03-12 DIAGNOSIS — Z833 Family history of diabetes mellitus: Secondary | ICD-10-CM | POA: Insufficient documentation

## 2017-03-12 DIAGNOSIS — D259 Leiomyoma of uterus, unspecified: Secondary | ICD-10-CM | POA: Insufficient documentation

## 2017-03-12 DIAGNOSIS — Z820 Family history of epilepsy and other diseases of the nervous system: Secondary | ICD-10-CM | POA: Diagnosis not present

## 2017-03-12 DIAGNOSIS — N8111 Cystocele, midline: Secondary | ICD-10-CM | POA: Diagnosis not present

## 2017-03-12 DIAGNOSIS — E785 Hyperlipidemia, unspecified: Secondary | ICD-10-CM | POA: Insufficient documentation

## 2017-03-12 DIAGNOSIS — N888 Other specified noninflammatory disorders of cervix uteri: Secondary | ICD-10-CM | POA: Insufficient documentation

## 2017-03-12 DIAGNOSIS — N816 Rectocele: Secondary | ICD-10-CM | POA: Diagnosis not present

## 2017-03-12 DIAGNOSIS — Z6831 Body mass index (BMI) 31.0-31.9, adult: Secondary | ICD-10-CM | POA: Diagnosis not present

## 2017-03-12 DIAGNOSIS — N814 Uterovaginal prolapse, unspecified: Secondary | ICD-10-CM | POA: Diagnosis not present

## 2017-03-12 DIAGNOSIS — Z79899 Other long term (current) drug therapy: Secondary | ICD-10-CM | POA: Diagnosis not present

## 2017-03-12 DIAGNOSIS — Z9049 Acquired absence of other specified parts of digestive tract: Secondary | ICD-10-CM | POA: Diagnosis not present

## 2017-03-12 DIAGNOSIS — I10 Essential (primary) hypertension: Secondary | ICD-10-CM | POA: Diagnosis not present

## 2017-03-12 DIAGNOSIS — M1990 Unspecified osteoarthritis, unspecified site: Secondary | ICD-10-CM | POA: Diagnosis not present

## 2017-03-12 DIAGNOSIS — G4730 Sleep apnea, unspecified: Secondary | ICD-10-CM | POA: Diagnosis not present

## 2017-03-12 DIAGNOSIS — N884 Hypertrophic elongation of cervix uteri: Secondary | ICD-10-CM | POA: Diagnosis not present

## 2017-03-12 DIAGNOSIS — R42 Dizziness and giddiness: Secondary | ICD-10-CM | POA: Insufficient documentation

## 2017-03-12 DIAGNOSIS — N7011 Chronic salpingitis: Secondary | ICD-10-CM | POA: Diagnosis not present

## 2017-03-12 DIAGNOSIS — Z90710 Acquired absence of both cervix and uterus: Secondary | ICD-10-CM | POA: Diagnosis present

## 2017-03-12 HISTORY — PX: VAGINAL HYSTERECTOMY: SHX2639

## 2017-03-12 HISTORY — PX: SALPINGOOPHORECTOMY: SHX82

## 2017-03-12 HISTORY — PX: ANTERIOR AND POSTERIOR REPAIR: SHX5121

## 2017-03-12 HISTORY — PX: CYSTOSCOPY: SHX5120

## 2017-03-12 LAB — TYPE AND SCREEN
ABO/RH(D): A NEG
ANTIBODY SCREEN: NEGATIVE

## 2017-03-12 LAB — CBC
HCT: 32.6 % — ABNORMAL LOW (ref 36.0–46.0)
Hemoglobin: 11.5 g/dL — ABNORMAL LOW (ref 12.0–15.0)
MCH: 31.9 pg (ref 26.0–34.0)
MCHC: 35.3 g/dL (ref 30.0–36.0)
MCV: 90.3 fL (ref 78.0–100.0)
PLATELETS: 271 10*3/uL (ref 150–400)
RBC: 3.61 MIL/uL — ABNORMAL LOW (ref 3.87–5.11)
RDW: 13.2 % (ref 11.5–15.5)
WBC: 10.5 10*3/uL (ref 4.0–10.5)

## 2017-03-12 SURGERY — HYSTERECTOMY, VAGINAL
Anesthesia: General | Site: Vagina

## 2017-03-12 MED ORDER — ONDANSETRON HCL 4 MG/2ML IJ SOLN
INTRAMUSCULAR | Status: DC | PRN
  Administered 2017-03-12: 4 mg via INTRAVENOUS

## 2017-03-12 MED ORDER — HYDROMORPHONE HCL 1 MG/ML IJ SOLN
INTRAMUSCULAR | Status: AC
Start: 2017-03-12 — End: 2017-03-12
  Filled 2017-03-12: qty 1

## 2017-03-12 MED ORDER — KCL IN DEXTROSE-NACL 20-5-0.45 MEQ/L-%-% IV SOLN
INTRAVENOUS | Status: DC
  Administered 2017-03-12: 12:00:00 via INTRAVENOUS
  Filled 2017-03-12 (×3): qty 1000

## 2017-03-12 MED ORDER — LISINOPRIL-HYDROCHLOROTHIAZIDE 20-25 MG PO TABS: 1.0000 | ORAL_TABLET | Freq: Every day | ORAL | Status: DC

## 2017-03-12 MED ORDER — MENTHOL 3 MG MT LOZG
1.0000 | LOZENGE | OROMUCOSAL | Status: DC | PRN
Start: 2017-03-12 — End: 2017-03-13
  Filled 2017-03-12: qty 9

## 2017-03-12 MED ORDER — MIDAZOLAM HCL 5 MG/5ML IJ SOLN
INTRAMUSCULAR | Status: DC | PRN
  Administered 2017-03-12: 2 mg via INTRAVENOUS

## 2017-03-12 MED ORDER — OXYCODONE-ACETAMINOPHEN 5-325 MG PO TABS
1.0000 | ORAL_TABLET | ORAL | Status: DC | PRN
  Administered 2017-03-12 – 2017-03-13 (×2): 1 via ORAL
  Filled 2017-03-12: qty 1

## 2017-03-12 MED ORDER — ROCURONIUM BROMIDE 100 MG/10ML IV SOLN
INTRAVENOUS | Status: DC | PRN
  Administered 2017-03-12 (×3): 10 mg via INTRAVENOUS
  Administered 2017-03-12: 50 mg via INTRAVENOUS

## 2017-03-12 MED ORDER — ENOXAPARIN SODIUM 40 MG/0.4ML ~~LOC~~ SOLN
40.0000 mg | SUBCUTANEOUS | Status: DC
  Filled 2017-03-12: qty 0.4

## 2017-03-12 MED ORDER — MEPERIDINE HCL 25 MG/ML IJ SOLN
6.2500 mg | INTRAMUSCULAR | Status: DC | PRN
  Filled 2017-03-12: qty 1

## 2017-03-12 MED ORDER — EPHEDRINE SULFATE 50 MG/ML IJ SOLN
INTRAMUSCULAR | Status: DC | PRN
  Administered 2017-03-12 (×2): 10 mg via INTRAVENOUS

## 2017-03-12 MED ORDER — ONDANSETRON HCL 4 MG/2ML IJ SOLN
INTRAMUSCULAR | Status: AC
  Filled 2017-03-12: qty 2

## 2017-03-12 MED ORDER — LIDOCAINE 2% (20 MG/ML) 5 ML SYRINGE
INTRAMUSCULAR | Status: AC
  Filled 2017-03-12: qty 5

## 2017-03-12 MED ORDER — BUPIVACAINE-EPINEPHRINE 0.25% -1:200000 IJ SOLN
INTRAMUSCULAR | Status: DC | PRN
  Administered 2017-03-12: 5 mL
  Administered 2017-03-12: 6 mL
  Administered 2017-03-12: 7 mL

## 2017-03-12 MED ORDER — ENOXAPARIN SODIUM 40 MG/0.4ML ~~LOC~~ SOLN
SUBCUTANEOUS | Status: AC
  Filled 2017-03-12: qty 0.4

## 2017-03-12 MED ORDER — SUGAMMADEX SODIUM 200 MG/2ML IV SOLN
INTRAVENOUS | Status: AC
  Filled 2017-03-12: qty 2

## 2017-03-12 MED ORDER — LEVOTHYROXINE SODIUM 112 MCG PO TABS
112.0000 ug | ORAL_TABLET | Freq: Every day | ORAL | Status: DC
  Filled 2017-03-12: qty 1

## 2017-03-12 MED ORDER — HYDROMORPHONE HCL 1 MG/ML IJ SOLN
INTRAMUSCULAR | Status: AC
  Filled 2017-03-12: qty 1

## 2017-03-12 MED ORDER — FENTANYL CITRATE (PF) 100 MCG/2ML IJ SOLN
INTRAMUSCULAR | Status: AC
  Filled 2017-03-12: qty 2

## 2017-03-12 MED ORDER — SOD CITRATE-CITRIC ACID 500-334 MG/5ML PO SOLN
30.0000 mL | ORAL | Status: AC
  Administered 2017-03-12: 30 mL via ORAL
  Filled 2017-03-12: qty 30

## 2017-03-12 MED ORDER — PROPOFOL 10 MG/ML IV BOLUS
INTRAVENOUS | Status: AC
  Filled 2017-03-12: qty 40

## 2017-03-12 MED ORDER — HYDROMORPHONE HCL 1 MG/ML IJ SOLN
0.2000 mg | INTRAMUSCULAR | Status: DC | PRN
  Filled 2017-03-12: qty 1

## 2017-03-12 MED ORDER — DOCUSATE SODIUM 100 MG PO CAPS
100.0000 mg | ORAL_CAPSULE | Freq: Two times a day (BID) | ORAL | Status: DC
  Administered 2017-03-13: 100 mg via ORAL
  Filled 2017-03-12: qty 1

## 2017-03-12 MED ORDER — DEXAMETHASONE SODIUM PHOSPHATE 4 MG/ML IJ SOLN
INTRAMUSCULAR | Status: DC | PRN
  Administered 2017-03-12: 10 mg via INTRAVENOUS

## 2017-03-12 MED ORDER — PROPOFOL 10 MG/ML IV BOLUS
INTRAVENOUS | Status: DC | PRN
  Administered 2017-03-12: 150 mg via INTRAVENOUS
  Administered 2017-03-12: 50 mg via INTRAVENOUS

## 2017-03-12 MED ORDER — LORATADINE 10 MG PO TABS
10.0000 mg | ORAL_TABLET | Freq: Every day | ORAL | Status: DC
  Filled 2017-03-12: qty 1

## 2017-03-12 MED ORDER — ONDANSETRON HCL 4 MG/2ML IJ SOLN
4.0000 mg | Freq: Four times a day (QID) | INTRAMUSCULAR | Status: DC | PRN
  Filled 2017-03-12: qty 2

## 2017-03-12 MED ORDER — LISINOPRIL 20 MG PO TABS
20.0000 mg | ORAL_TABLET | Freq: Every day | ORAL | Status: DC
  Filled 2017-03-12: qty 1

## 2017-03-12 MED ORDER — ROCURONIUM BROMIDE 10 MG/ML (PF) SYRINGE
PREFILLED_SYRINGE | INTRAVENOUS | Status: AC
  Filled 2017-03-12: qty 5

## 2017-03-12 MED ORDER — SODIUM CHLORIDE 0.9 % IJ SOLN
INTRAMUSCULAR | Status: AC
  Filled 2017-03-12: qty 10

## 2017-03-12 MED ORDER — KETOROLAC TROMETHAMINE 30 MG/ML IJ SOLN
INTRAMUSCULAR | Status: AC
  Filled 2017-03-12: qty 1

## 2017-03-12 MED ORDER — KETOROLAC TROMETHAMINE 15 MG/ML IJ SOLN
15.0000 mg | Freq: Four times a day (QID) | INTRAMUSCULAR | Status: DC
  Administered 2017-03-12: 15 mg via INTRAVENOUS
  Filled 2017-03-12: qty 1

## 2017-03-12 MED ORDER — SOD CITRATE-CITRIC ACID 500-334 MG/5ML PO SOLN
ORAL | Status: AC
  Filled 2017-03-12: qty 15

## 2017-03-12 MED ORDER — ZOLPIDEM TARTRATE 5 MG PO TABS
5.0000 mg | ORAL_TABLET | Freq: Every evening | ORAL | Status: DC | PRN
  Filled 2017-03-12: qty 1

## 2017-03-12 MED ORDER — DEXTROSE 5 % IV SOLN
2.0000 g | INTRAVENOUS | Status: AC
  Administered 2017-03-12: 2 g via INTRAVENOUS
  Filled 2017-03-12: qty 2

## 2017-03-12 MED ORDER — FENTANYL CITRATE (PF) 100 MCG/2ML IJ SOLN
INTRAMUSCULAR | Status: DC | PRN
  Administered 2017-03-12: 100 ug via INTRAVENOUS
  Administered 2017-03-12 (×4): 25 ug via INTRAVENOUS
  Administered 2017-03-12: 50 ug via INTRAVENOUS
  Administered 2017-03-12 (×2): 25 ug via INTRAVENOUS

## 2017-03-12 MED ORDER — DEXAMETHASONE SODIUM PHOSPHATE 10 MG/ML IJ SOLN
INTRAMUSCULAR | Status: AC
  Filled 2017-03-12: qty 1

## 2017-03-12 MED ORDER — CEFOTETAN DISODIUM-DEXTROSE 2-2.08 GM-%(50ML) IV SOLR
INTRAVENOUS | Status: AC
  Filled 2017-03-12: qty 50

## 2017-03-12 MED ORDER — ACETAMINOPHEN 325 MG PO TABS
650.0000 mg | ORAL_TABLET | ORAL | Status: DC | PRN
  Filled 2017-03-12: qty 2

## 2017-03-12 MED ORDER — OXYCODONE-ACETAMINOPHEN 5-325 MG PO TABS
2.0000 | ORAL_TABLET | ORAL | Status: DC | PRN
  Administered 2017-03-12: 2 via ORAL
  Filled 2017-03-12: qty 2

## 2017-03-12 MED ORDER — OXYCODONE-ACETAMINOPHEN 5-325 MG PO TABS
ORAL_TABLET | ORAL | Status: AC
  Filled 2017-03-12: qty 2

## 2017-03-12 MED ORDER — HYDROMORPHONE HCL 1 MG/ML IJ SOLN
0.2500 mg | INTRAMUSCULAR | Status: DC | PRN
  Administered 2017-03-12: 0.5 mg via INTRAVENOUS
  Administered 2017-03-12 (×2): 0.25 mg via INTRAVENOUS
  Administered 2017-03-12 (×2): 0.5 mg via INTRAVENOUS
  Filled 2017-03-12: qty 0.5

## 2017-03-12 MED ORDER — ENOXAPARIN SODIUM 40 MG/0.4ML ~~LOC~~ SOLN
40.0000 mg | SUBCUTANEOUS | Status: AC
  Administered 2017-03-12: 40 mg via SUBCUTANEOUS
  Filled 2017-03-12: qty 0.4

## 2017-03-12 MED ORDER — LACTATED RINGERS IV SOLN
INTRAVENOUS | Status: DC
  Administered 2017-03-12 (×2): via INTRAVENOUS
  Filled 2017-03-12: qty 1000

## 2017-03-12 MED ORDER — KETOROLAC TROMETHAMINE 30 MG/ML IJ SOLN
INTRAMUSCULAR | Status: DC | PRN
  Administered 2017-03-12: 30 mg via INTRAVENOUS

## 2017-03-12 MED ORDER — MIDAZOLAM HCL 2 MG/2ML IJ SOLN
0.5000 mg | Freq: Once | INTRAMUSCULAR | Status: DC | PRN
  Filled 2017-03-12: qty 2

## 2017-03-12 MED ORDER — SUGAMMADEX SODIUM 200 MG/2ML IV SOLN
INTRAVENOUS | Status: DC | PRN
  Administered 2017-03-12: 200 mg via INTRAVENOUS

## 2017-03-12 MED ORDER — ESTRADIOL 0.1 MG/GM VA CREA
TOPICAL_CREAM | VAGINAL | Status: DC | PRN
  Administered 2017-03-12: 1 via VAGINAL

## 2017-03-12 MED ORDER — HYDROCHLOROTHIAZIDE 25 MG PO TABS
25.0000 mg | ORAL_TABLET | Freq: Every day | ORAL | Status: DC
  Filled 2017-03-12: qty 1

## 2017-03-12 MED ORDER — MIDAZOLAM HCL 2 MG/2ML IJ SOLN
INTRAMUSCULAR | Status: AC
  Filled 2017-03-12: qty 2

## 2017-03-12 MED ORDER — SODIUM CHLORIDE 0.9 % IV BOLUS (SEPSIS)
500.0000 mL | Freq: Once | INTRAVENOUS | Status: AC
  Administered 2017-03-12: 500 mL via INTRAVENOUS
  Filled 2017-03-12: qty 500

## 2017-03-12 MED ORDER — PROMETHAZINE HCL 25 MG/ML IJ SOLN
6.2500 mg | INTRAMUSCULAR | Status: DC | PRN
  Filled 2017-03-12: qty 1

## 2017-03-12 MED ORDER — LIDOCAINE HCL (CARDIAC) 20 MG/ML IV SOLN
INTRAVENOUS | Status: DC | PRN
  Administered 2017-03-12: 100 mg via INTRAVENOUS

## 2017-03-12 MED ORDER — ALUM & MAG HYDROXIDE-SIMETH 200-200-20 MG/5ML PO SUSP
30.0000 mL | ORAL | Status: DC | PRN
  Filled 2017-03-12: qty 30

## 2017-03-12 MED ORDER — ONDANSETRON HCL 4 MG PO TABS
4.0000 mg | ORAL_TABLET | Freq: Four times a day (QID) | ORAL | Status: DC | PRN
  Filled 2017-03-12: qty 1

## 2017-03-12 MED ORDER — EPHEDRINE 5 MG/ML INJ
INTRAVENOUS | Status: AC
  Filled 2017-03-12: qty 10

## 2017-03-12 SURGICAL SUPPLY — 36 items
BLADE SURG 15 STRL LF DISP TIS (BLADE) ×3 IMPLANT
BLADE SURG 15 STRL SS (BLADE) ×2
CANISTER SUCT 3000ML PPV (MISCELLANEOUS) ×5 IMPLANT
COVER MAYO STAND STRL (DRAPES) ×5 IMPLANT
GAUZE SPONGE 4X4 16PLY XRAY LF (GAUZE/BANDAGES/DRESSINGS) ×5 IMPLANT
GLOVE BIOGEL PI IND STRL 7.0 (GLOVE) ×6 IMPLANT
GLOVE BIOGEL PI INDICATOR 7.0 (GLOVE) ×4
GLOVE ECLIPSE 6.5 STRL STRAW (GLOVE) ×5 IMPLANT
GOWN STRL REUS W/TWL LRG LVL3 (GOWN DISPOSABLE) ×20 IMPLANT
IV NS 1000ML (IV SOLUTION) ×2
IV NS 1000ML BAXH (IV SOLUTION) ×3 IMPLANT
LIGASURE IMPACT 36 18CM CVD LR (INSTRUMENTS) ×5 IMPLANT
NEEDLE HYPO 22GX1.5 SAFETY (NEEDLE) ×5 IMPLANT
NEEDLE MAYO 6 CRC TAPER PT (NEEDLE) ×10 IMPLANT
NS IRRIG 500ML POUR BTL (IV SOLUTION) ×10 IMPLANT
PACK VAGINAL WOMENS (CUSTOM PROCEDURE TRAY) ×5 IMPLANT
PACK WEDGE TRENDGUARD 450 PROC (MISCELLANEOUS) ×3 IMPLANT
PACKING VAGINAL (PACKING) ×5 IMPLANT
SET IRRIG Y TYPE TUR BLADDER L (SET/KITS/TRAYS/PACK) ×5 IMPLANT
SPONGE SURGIFOAM ABS GEL 12-7 (HEMOSTASIS) ×10 IMPLANT
SURGIFLO W/THROMBIN 8M KIT (HEMOSTASIS) IMPLANT
SUT MNCRL AB 4-0 PS2 18 (SUTURE) IMPLANT
SUT PDS AB 0 CT1 36 (SUTURE) ×20 IMPLANT
SUT PDS AB 3-0 SH 27 (SUTURE) IMPLANT
SUT VIC AB 0 CT1 18XCR BRD8 (SUTURE) ×3 IMPLANT
SUT VIC AB 0 CT1 36 (SUTURE) ×10 IMPLANT
SUT VIC AB 0 CT1 8-18 (SUTURE) ×2
SUT VIC AB 2-0 CT2 27 (SUTURE) IMPLANT
SUT VIC AB 2-0 SH 18 (SUTURE) ×10 IMPLANT
SUT VIC AB 2-0 SH 27 (SUTURE) ×6
SUT VIC AB 2-0 SH 27XBRD (SUTURE) ×9 IMPLANT
SUT VICRYL 0 TIES 12 18 (SUTURE) ×5 IMPLANT
SYR BULB IRRIGATION 50ML (SYRINGE) ×5 IMPLANT
TOWEL OR 17X24 6PK STRL BLUE (TOWEL DISPOSABLE) ×10 IMPLANT
TRAY FOLEY CATH SILVER 14FR (SET/KITS/TRAYS/PACK) ×5 IMPLANT
TRENDGUARD 450 WEDGE PROC PACK (MISCELLANEOUS) ×5

## 2017-03-12 NOTE — Transfer of Care (Signed)
Immediate Anesthesia Transfer of Care Note  Patient: Jordan Palmer  Procedure(s) Performed: Procedure(s) (LRB): HYSTERECTOMY VAGINAL (N/A) SALPINGECTOMY (Left) ANTERIOR (CYSTOCELE) AND POSTERIOR REPAIR (RECTOCELE) (N/A) CYSTOSCOPY (N/A)  Patient Location: PACU  Anesthesia Type: General  Level of Consciousness: awake, sedated, patient cooperative and responds to stimulation  Airway & Oxygen Therapy: Patient Spontanous Breathing and Patient connected to NCO2  Post-op Assessment: Report given to PACU RN, Post -op Vital signs reviewed and stable and Patient moving all extremities  Post vital signs: Reviewed and stable  Complications: No apparent anesthesia complications

## 2017-03-12 NOTE — Interval H&P Note (Signed)
History and Physical Interval Note:  03/12/2017 7:10 AM  Jordan Palmer  has presented today for surgery, with the diagnosis of uterine prolapse  The various methods of treatment have been discussed with the patient and family. After consideration of risks, benefits and other options for treatment, the patient has consented to  Procedure(s) with comments: HYSTERECTOMY VAGINAL (N/A) SALPINGO OOPHORECTOMY  possible (Bilateral) - possible ANTERIOR (CYSTOCELE) AND POSTERIOR REPAIR (RECTOCELE) (N/A) CYSTOSCOPY (N/A) uterosacral suspension as a surgical intervention .  The patient's history has been reviewed, patient examined, no change in status, stable for surgery.  I have reviewed the patient's chart and labs.  Questions were answered to the patient's satisfaction.    She had cardiac clearance last week.    Salvadore Dom

## 2017-03-12 NOTE — Progress Notes (Signed)
Day of Surgery Procedure(s) (LRB): HYSTERECTOMY VAGINAL (N/A) SALPINGECTOMY (Left) ANTERIOR (CYSTOCELE) AND POSTERIOR REPAIR (RECTOCELE) (N/A), uterosacral ligament suspension CYSTOSCOPY (N/A)  Subjective: Patient reports that she is doing okay, pain is controlled with the medication, she hasn't gotten out of bed yet or voided. Tolerating food.    Objective: I have reviewed patient's vital signs and intake and output. Today's Vitals   03/12/17 1440 03/12/17 1534 03/12/17 1600 03/12/17 1651  BP: 132/68 (!) 143/71    Pulse: 87 87    Resp: 16 16    Temp: 98.2 F (36.8 C) 98.5 F (36.9 C)    TempSrc:      SpO2: 98% 98%    Weight:      Height:      PainSc: Asleep Asleep 4  Asleep    Intake/Output Summary (Last 24 hours) at 03/12/2017 1726 Last data filed at 03/12/2017 1500 Gross per 24 hour  Intake 2357.5 ml  Output 450 ml  Net 1907.5 ml     General: alert, cooperative and no distress Resp: clear to auscultation bilaterally Cardio: S1, S2 normal GI: soft, non-tender; bowel sounds normal; no masses,  no organomegaly Extremities: extremities normal, atraumatic, no cyanosis or edema Vaginal Bleeding: small to moderate amount on the pad  Assessment: s/p Procedure(s): HYSTERECTOMY VAGINAL (N/A) SALPINGECTOMY (Left) ANTERIOR (CYSTOCELE) AND POSTERIOR REPAIR (RECTOCELE) (N/A) CYSTOSCOPY (N/A): stable and progressing well  Plan: Encourage ambulation, she will get up and void.  Addendum: patient up, voided 100 cc, post void bladder scan was negative.  She did pass some blood and clots in the toilet.  Urine output low. Will give a 500 cc bolus of normal saline CBC now  LOS: 0 days    Jordan Palmer 03/12/2017, 5:24 PM

## 2017-03-12 NOTE — Progress Notes (Signed)
Dr Talbert Nan called and notified of patient output and bladder scan results of 50cc. No new orders received.

## 2017-03-12 NOTE — Op Note (Signed)
Preoperative Diagnosis: Symptomatic genital prolapse  Postoperative Diagnosis: Same  Procedure:  Total Vaginal Hysterectomy with left salpingectomy, uterosacral ligament suspension, anterior and posterior colporrhaphy and cystoscopy  Surgeon: Dr Sumner Boast  Assistant: Dr Edwinna Areola  Anesthesia: General  EBL: 150 cc  Fluids: 1,600 cc LR  Urine output: 295 cc   Complications: none  Indications for surgery: The patient is a 67 year old female, who presented with symptomatic genital prolapse. She tried using a vaginal pessary without success. She has no incontinence with or without a pessary.  The patient is aware of the risks and complications involved with the surgery and consent was obtained prior to the procedure.  Findings: She had a grade 3 uterine prolapse with an elongated cervix, grade 1 vault prolapse, grade 1-2 cystocele and grade 1-2 uterine prolapse. Left hydrosalpinx, otherwise normal adnexa.   Procedure: The patient was taken to the operating room with an IV in placed, preoperative antibiotics had been administered. She was placed in the dorsal lithotomy position. General anesthesia was administered. She was prepped and draped in the usual sterile fashion for a vaginal surgery. A foley catheter was placed.   A weighted speculum was placed in the vaginal and the cervix was grasped with a Jacob's tenaculum. The cervix was circumferentially injected with 0.25% marcaine with epinephrine, then incised with a #10 blade. The posterior peritoneum was sharply entered. The long weighted speculum was then placed in the vagina into the posterior cul de sac. The uterosacral ligaments were bilaterally clamped with a haney clamp, then with the ligasure impact distal to the haney. The ligasure was used to cauterize and cut the uterosacral ligaments. A 0 Vicryl stitch was placed posterior to the haney clamp and held on to use later in the case. The vaginal mucosa was dissected off of the  anterior cervix sharply. The anterior cul de sac was sharply entered. The cardinal ligaments, uterine vessels and lower broad ligament were bilaterally cauterized and cut with the ligasure impact. The round ligament, tube and uterine ovarian ligaments were bilaterally grasped with a haney clamp, then cauterized and cut distal to the haney with the ligasure impact. A free tie, then stitch of 0-Vicryl was then placed behind the haney on either side. Hemostasis was excellent. The left tube had a hydrosalpinx, it was grasped with a babcock and then removed with the ligasure impact. The right adnexa and left ovary were too high in the pelvis to safely remove. There was slight oozing from the edge of the left ovary which was stopped with cautery.   The vaginal cuff was whip stitched with 2-0 Vicryl from 2-10 o'clock. The uterosacral ligament on the right was identified and 3 stitches were placed, a 0-Vicryl more distally and the 2 separate sutures of 0-PDS were placed each more proximally than the next on the uterosacral ligament. The same procedure was repeated on the right.   Attention was then turned to the cystocele repair. The anterior vaginal cuff was grasped with alice clamps. Another alice clamp was placed under the bladder neck at the upper edge of the cystocele. The vaginal mucosa was injected with 0.25% marcaine with epinephrine, then undermined and incised in the midline The vaginal mucosa was dissected off of the bladder and the cystocele was reduced with interrupted sutures of 2-0 vicryl. A very small amount of vaginal mucosa was trimmed. A small amount of gelfoam was placed against the vaginal mucosa, hemostasis was excellent. The vaginal mucosa over the cystocele was closed with  a running locked stitch of 2-0 Vicryl. The foley was removed. Cystoscopy was performed and there were no stiches in the bladder. The bladder was drained.   Each previously placed stitch in the uterosacral ligaments was then  placed through the anterior and posterior portion of the vaginal cuff, with the most distally placed stitch on the uterosacral ligament being placed more laterally on the cuff and the most proximal stitch on the uterosacral ligament coming out near the midline. The stitches were tied down laterally to medially, elevating the cuff with tieing off each stitch. There was excellent elevation and closure of the vaginal cuff. Hemostasis was excellent. The cystoscopy was repeated and bilateral ureteral jets were noted. The bladder was inspected and was intact. The bladder was drained and the foley was replaced.    Attention was turned to the rectocele repair. 2 alices were placed on the posterior fourchette, just lateral to midline on both sides. The perineum was injected with lidocaine with epinephrine. A small triangular piece of skin was removed from the perineum. An alice clamp was placed on the superior portion of the rectocele and the vaginal mucosa was injected with lidocaine with epinephrine. The vaginal mucosa was dissected off of the rectum and incised with the midline. The rectocele was reduced and was closed with multiple interrupted sutures of 2-0 Vicryl. A small amount of rectal mucosa was trimmed and the vaginal mucosa and perineum were closed with a running stitch of 2-0 Vicryl. A small amount of gelfoam was placed over the rectum and under the vaginal mucosa. Hemostasis was excellent. A couple of grams of estrace cream were placed in the vagina.   The foley catheter was removed and left out.   The patient's perineum was cleansed and she was taken out of the dorsal lithotomy position. Upon awakening she was extubated and taken to the recovery room in stable condition. The sponge and instrument counts were correct.

## 2017-03-12 NOTE — Anesthesia Procedure Notes (Signed)
Procedure Name: Intubation Date/Time: 03/12/2017 7:30 AM Performed by: Justice Rocher, CRNA Pre-anesthesia Checklist: Patient identified, Emergency Drugs available, Suction available and Patient being monitored Patient Re-evaluated:Patient Re-evaluated prior to induction Oxygen Delivery Method: Circle system utilized Preoxygenation: Pre-oxygenation with 100% oxygen Induction Type: IV induction Ventilation: Mask ventilation without difficulty Laryngoscope Size: Mac and 4 Grade View: Grade II Tube type: Oral Tube size: 7.0 mm Number of attempts: 1 Airway Equipment and Method: Stylet and Oral airway Placement Confirmation: ETT inserted through vocal cords under direct vision,  positive ETCO2 and breath sounds checked- equal and bilateral Secured at: 21 cm Tube secured with: Tape Dental Injury: Teeth and Oropharynx as per pre-operative assessment

## 2017-03-12 NOTE — Anesthesia Postprocedure Evaluation (Signed)
Anesthesia Post Note  Patient: Jordan Palmer  Procedure(s) Performed: HYSTERECTOMY VAGINAL (N/A Vagina ) SALPINGECTOMY (Left Vagina ) ANTERIOR (CYSTOCELE) AND POSTERIOR REPAIR (RECTOCELE) (N/A Vagina ) CYSTOSCOPY (N/A Bladder)     Patient location during evaluation: PACU Anesthesia Type: General Level of consciousness: awake and alert, patient cooperative and oriented Pain management: pain level controlled Vital Signs Assessment: post-procedure vital signs reviewed and stable Respiratory status: spontaneous breathing, nonlabored ventilation and respiratory function stable Cardiovascular status: blood pressure returned to baseline and stable Postop Assessment: no apparent nausea or vomiting Anesthetic complications: no    Last Vitals:  Vitals:   03/12/17 1200 03/12/17 1215  BP: 129/67 134/69  Pulse: 82 82  Resp: 13 13  Temp:    SpO2: 98% 98%    Last Pain:  Vitals:   03/12/17 1215  TempSrc:   PainSc: (P) 4                  Rayanna Matusik,E. Margarite Vessel

## 2017-03-13 ENCOUNTER — Encounter (HOSPITAL_BASED_OUTPATIENT_CLINIC_OR_DEPARTMENT_OTHER): Payer: Self-pay | Admitting: Obstetrics and Gynecology

## 2017-03-13 DIAGNOSIS — D259 Leiomyoma of uterus, unspecified: Secondary | ICD-10-CM | POA: Diagnosis not present

## 2017-03-13 MED ORDER — DOCUSATE SODIUM 100 MG PO CAPS: 100.0000 mg | ORAL_CAPSULE | Freq: Two times a day (BID) | ORAL | 0 refills | Status: DC

## 2017-03-13 MED ORDER — DOCUSATE SODIUM 100 MG PO CAPS
ORAL_CAPSULE | ORAL | Status: AC
  Filled 2017-03-13: qty 1

## 2017-03-13 MED ORDER — IBUPROFEN 800 MG PO TABS: 800.0000 mg | ORAL_TABLET | Freq: Three times a day (TID) | ORAL | 1 refills | Status: DC | PRN

## 2017-03-13 MED ORDER — IBUPROFEN 200 MG PO TABS
ORAL_TABLET | ORAL | Status: AC
  Filled 2017-03-13: qty 4

## 2017-03-13 MED ORDER — OXYCODONE-ACETAMINOPHEN 5-325 MG PO TABS: 1.0000 | ORAL_TABLET | ORAL | 0 refills | Status: DC | PRN

## 2017-03-13 MED ORDER — IBUPROFEN 800 MG PO TABS
800.0000 mg | ORAL_TABLET | Freq: Three times a day (TID) | ORAL | Status: DC
  Administered 2017-03-13: 800 mg via ORAL
  Filled 2017-03-13: qty 1

## 2017-03-13 NOTE — Progress Notes (Signed)
1 Day Post-Op Procedure(s) (LRB): HYSTERECTOMY VAGINAL (N/A) SALPINGECTOMY (Left) ANTERIOR (CYSTOCELE) AND POSTERIOR REPAIR (RECTOCELE) (N/A), uterosacral ligament suspension CYSTOSCOPY (N/A)  Subjective: Patient reports tolerating PO and no problems voiding.  Pain is a 1/10, ambulating.   Objective: I have reviewed patient's vital signs, intake and output and labs.  Today's Vitals   03/13/17 0219 03/13/17 0222 03/13/17 0600 03/13/17 0611  BP: (!) 127/54  131/72   Pulse: 92  82   Resp: 16  16   Temp: 98.8 F (37.1 C)  98.6 F (37 C)   TempSrc: Oral  Oral   SpO2: 97%  97%   Weight:      Height:      PainSc:  3   1     I/O last 3 completed shifts: In: 3707.5 [P.O.:720; I.V.:2987.5] Out: 1075 [Urine:925; Blood:150] No intake/output data recorded.   Lab Results  Component Value Date   WBC 10.5 03/12/2017   HGB 11.5 (L) 03/12/2017   HCT 32.6 (L) 03/12/2017   MCV 90.3 03/12/2017   PLT 271 03/12/2017     General: alert, cooperative and no distress Resp: clear to auscultation bilaterally Cardio: S1, S2 normal GI: soft, non-tender; bowel sounds normal; no masses,  no organomegaly Extremities: extremities normal, atraumatic, no cyanosis or edema Vaginal Bleeding: minimal  Assessment: s/p Procedure(s): HYSTERECTOMY VAGINAL (N/A) SALPINGECTOMY (Left) ANTERIOR (CYSTOCELE) AND POSTERIOR REPAIR (RECTOCELE) (N/A) CYSTOSCOPY (N/A): stable, progressing well and tolerating diet  Plan: Discharge home  LOS: 0 days    Salvadore Dom 03/13/2017, 7:15 AM

## 2017-03-20 ENCOUNTER — Ambulatory Visit (INDEPENDENT_AMBULATORY_CARE_PROVIDER_SITE_OTHER): Payer: PRIVATE HEALTH INSURANCE | Admitting: Obstetrics and Gynecology

## 2017-03-20 ENCOUNTER — Encounter: Payer: Self-pay | Admitting: Obstetrics and Gynecology

## 2017-03-20 ENCOUNTER — Other Ambulatory Visit: Payer: Self-pay

## 2017-03-20 VITALS — BP 124/78 | HR 80 | Resp 14 | Wt 164.0 lb

## 2017-03-20 DIAGNOSIS — Z90710 Acquired absence of both cervix and uterus: Secondary | ICD-10-CM

## 2017-03-20 NOTE — Progress Notes (Signed)
GYNECOLOGY  VISIT   HPI: 67 y.o.   Married  Caucasian  female   G1P1001 with No LMP recorded. Patient has had a hysterectomy.   here for follow up. Patient is 8 days s/p vaginal hysterectomy, uterosacral ligament suspension, A&P repair.   She had nausea, vomiting, diarrhea on Saturday morning. Fine since then.  Slight spotting, voiding fine, normal BM now. Occasional dull ache, takes ibuprofen. Minimal bleeding.   GYNECOLOGIC HISTORY: No LMP recorded. Patient has had a hysterectomy. Contraception:hysterectomy  Menopausal hormone therapy: none        OB History    Gravida Para Term Preterm AB Living   1 1 1     1    SAB TAB Ectopic Multiple Live Births           1         Patient Active Problem List   Diagnosis Date Noted  . H/O total vaginal hysterectomy 03/12/2017  . Essential hypertension 03/08/2017  . Mixed dyslipidemia 03/08/2017  . Abnormal EKG 03/08/2017  . Preoperative cardiovascular examination 03/08/2017  . Allergic state 12/11/2013  . High cholesterol 12/11/2013  . History of depression 12/11/2013  . Hypothyroidism, unspecified 12/11/2013  . Sleep apnea 12/11/2013    Past Medical History:  Diagnosis Date  . Arthritis   . Hyperlipemia   . Hypertension   . Hypothyroidism   . Sleep apnea   . Vertigo     Past Surgical History:  Procedure Laterality Date  . ANTERIOR AND POSTERIOR REPAIR N/A 03/12/2017   Procedure: ANTERIOR (CYSTOCELE) AND POSTERIOR REPAIR (RECTOCELE);  Surgeon: Salvadore Dom, MD;  Location: [facility];  Service: Gynecology;  Laterality: N/A;  . CARPAL TUNNEL RELEASE Bilateral   . CHOLECYSTECTOMY    . CYSTOSCOPY N/A 03/12/2017   Procedure: CYSTOSCOPY;  Surgeon: Salvadore Dom, MD;  Location: [facility];  Service: Gynecology;  Laterality: N/A;  . EYE SURGERY    . KNEE ARTHROSCOPY Left   . LACRIMAL DUCT PROBING W/ DACRYOPLASTY Bilateral   . SALPINGOOPHORECTOMY Left 03/12/2017   Procedure:  SALPINGECTOMY;  Surgeon: Salvadore Dom, MD;  Location: [facility];  Service: Gynecology;  Laterality: Left;  . SHOULDER ARTHROSCOPY WITH OPEN ROTATOR CUFF REPAIR Right 09/03/2014   Procedure: SHOULDER ARTHROSCOPY WITH OPEN ROTATOR CUFF REPAIR;  Surgeon: Thornton Park, MD;  Location: [facility];  Service: Orthopedics;  Laterality: Right;  . THYROIDECTOMY Right    hemi-thyroidectomy  . VAGINAL HYSTERECTOMY N/A 03/12/2017   Procedure: HYSTERECTOMY VAGINAL;  Surgeon: Salvadore Dom, MD;  Location: [facility];  Service: Gynecology;  Laterality: N/A;    Current Outpatient Medications  Medication Sig Dispense Refill  . Biotin 10000 MCG TBDP Take 1 tablet by mouth daily.     Marland Kitchen docusate sodium (COLACE) 100 MG capsule Take 1 capsule (100 mg total) by mouth 2 (two) times daily. 30 capsule 0  . fexofenadine (ALLEGRA) 180 MG tablet Take 180 mg by mouth daily as needed for allergies.     Marland Kitchen ibuprofen (ADVIL,MOTRIN) 800 MG tablet Take 1 tablet (800 mg total) by mouth every 8 (eight) hours as needed. 30 tablet 1  . levothyroxine (SYNTHROID, LEVOTHROID) 112 MCG tablet Take 112 mcg by mouth daily before breakfast.    . rosuvastatin (CRESTOR) 5 MG tablet Take 5 mg by mouth every morning.     . vitamin B-12 (CYANOCOBALAMIN) 1000 MCG tablet Take 1,000 mcg by mouth daily.     Marland Kitchen lisinopril-hydrochlorothiazide (PRINZIDE,ZESTORETIC) 20-25 MG  per tablet Take 1 tablet by mouth daily.     No current facility-administered medications for this visit.      ALLERGIES: Codeine and Doxycycline  Family History  Problem Relation Age of Onset  . Diabetes type II Father   . CAD Father   . Alzheimer's disease Father   . Alzheimer's disease Mother   . Breast cancer Neg Hx     Social History   Socioeconomic History  . Marital status: Married    Spouse name: Not on file  . Number of children: Not on file  . Years of education: Not on file  . Highest education level: Not on  file  Social Needs  . Financial resource strain: Not on file  . Food insecurity - worry: Not on file  . Food insecurity - inability: Not on file  . Transportation needs - medical: Not on file  . Transportation needs - non-medical: Not on file  Occupational History  . Not on file  Tobacco Use  . Smoking status: Never Smoker  . Smokeless tobacco: Never Used  Substance and Sexual Activity  . Alcohol use: Yes    Alcohol/week: 2.4 - 3.6 oz    Types: 4 - 6 Standard drinks or equivalent per week    Comment: Occ  . Drug use: No  . Sexual activity: Not on file  Other Topics Concern  . Not on file  Social History Narrative  . Not on file    Review of Systems  Constitutional: Negative.   HENT: Negative.   Eyes: Negative.   Respiratory: Negative.   Cardiovascular: Negative.   Gastrointestinal: Negative.   Genitourinary: Negative.   Musculoskeletal: Negative.   Skin: Negative.   Neurological: Negative.   Endo/Heme/Allergies: Negative.   Psychiatric/Behavioral: Negative.     PHYSICAL EXAMINATION:    BP 124/78 (BP Location: Right Arm, Patient Position: Sitting)   Pulse 80   Resp 14   Wt 164 lb (74.4 kg)   BMI 30.99 kg/m     General appearance: alert, cooperative and appears stated age  Pelvic: External genitalia:  no lesions              Urethra:  normal appearing urethra with no masses, tenderness or lesions              Bartholins and Skenes: normal                 Vagina: healing well              Cervix: absent              Bimanual Exam:  Uterus:  uterus absent              Adnexa: no mass, fullness, tenderness               Chaperone was present for exam.  ASSESSMENT 8 days s/p TVH/LS/uterosacral suspension/A&P repair and cystoscopy. She is doing well    PLAN F/U next month, sooner with concerns   An After Visit Summary was printed and given to the patient.

## 2017-04-23 ENCOUNTER — Encounter: Payer: Self-pay | Admitting: Obstetrics and Gynecology

## 2017-04-23 ENCOUNTER — Other Ambulatory Visit: Payer: Self-pay

## 2017-04-23 ENCOUNTER — Ambulatory Visit (INDEPENDENT_AMBULATORY_CARE_PROVIDER_SITE_OTHER): Payer: PRIVATE HEALTH INSURANCE | Admitting: Obstetrics and Gynecology

## 2017-04-23 VITALS — BP 118/60 | HR 64 | Resp 16 | Wt 169.0 lb

## 2017-04-23 DIAGNOSIS — N952 Postmenopausal atrophic vaginitis: Secondary | ICD-10-CM

## 2017-04-23 DIAGNOSIS — N898 Other specified noninflammatory disorders of vagina: Secondary | ICD-10-CM

## 2017-04-23 DIAGNOSIS — Z90710 Acquired absence of both cervix and uterus: Secondary | ICD-10-CM

## 2017-04-23 MED ORDER — ESTRADIOL 0.1 MG/GM VA CREA: TOPICAL_CREAM | VAGINAL | 1 refills | Status: DC

## 2017-04-23 NOTE — Progress Notes (Signed)
GYNECOLOGY  VISIT   HPI: 67 y.o.   Married  Caucasian  female   G1P1001 with No LMP recorded. Patient has had a hysterectomy.   here for follow up. Patient is 6 weeks s/p vaginal hysterectomy, A&P repair and uterosacral ligament suspension. Patient is still c/o vaginal discharge and a vaginal lump. The lump is pea sized tender, hard spot at the bottom of the opening of the vagina, noticed it a week ago. She c/o a brownish, pinkish vaginal d/c.  No pain other than the bump, no prolapse.     Bowel movements are fine. Voiding fine, occasional GSI since the surgery, small amount and tolerable.   GYNECOLOGIC HISTORY: No LMP recorded. Patient has had a hysterectomy. Contraception:hysterectomy Menopausal hormone therapy: none         OB History    Gravida  1   Para  1   Term  1   Preterm      AB      Living  1     SAB      TAB      Ectopic      Multiple      Live Births  1              Patient Active Problem List   Diagnosis Date Noted  . H/O total vaginal hysterectomy 03/12/2017  . Essential hypertension 03/08/2017  . Mixed dyslipidemia 03/08/2017  . Abnormal EKG 03/08/2017  . Preoperative cardiovascular examination 03/08/2017  . Allergic state 12/11/2013  . High cholesterol 12/11/2013  . History of depression 12/11/2013  . Hypothyroidism, unspecified 12/11/2013  . Sleep apnea 12/11/2013    Past Medical History:  Diagnosis Date  . Arthritis   . Hyperlipemia   . Hypertension   . Hypothyroidism   . Sleep apnea   . Vertigo     Past Surgical History:  Procedure Laterality Date  . ABDOMINAL HYSTERECTOMY    . ANTERIOR AND POSTERIOR REPAIR N/A 03/12/2017   Procedure: ANTERIOR (CYSTOCELE) AND POSTERIOR REPAIR (RECTOCELE);  Surgeon: Salvadore Dom, MD;  Location: [facility];  Service: Gynecology;  Laterality: N/A;  . CARPAL TUNNEL RELEASE Bilateral   . CHOLECYSTECTOMY    . CYSTOSCOPY N/A 03/12/2017   Procedure: CYSTOSCOPY;  Surgeon:  Salvadore Dom, MD;  Location: [facility];  Service: Gynecology;  Laterality: N/A;  . EYE SURGERY    . KNEE ARTHROSCOPY Left   . LACRIMAL DUCT PROBING W/ DACRYOPLASTY Bilateral   . SALPINGOOPHORECTOMY Left 03/12/2017   Procedure: SALPINGECTOMY;  Surgeon: Salvadore Dom, MD;  Location: [facility];  Service: Gynecology;  Laterality: Left;  . SHOULDER ARTHROSCOPY WITH OPEN ROTATOR CUFF REPAIR Right 09/03/2014   Procedure: SHOULDER ARTHROSCOPY WITH OPEN ROTATOR CUFF REPAIR;  Surgeon: Thornton Park, MD;  Location: [facility];  Service: Orthopedics;  Laterality: Right;  . THYROIDECTOMY Right    hemi-thyroidectomy  . VAGINAL HYSTERECTOMY N/A 03/12/2017   Procedure: HYSTERECTOMY VAGINAL;  Surgeon: Salvadore Dom, MD;  Location: [facility];  Service: Gynecology;  Laterality: N/A;    Current Outpatient Medications  Medication Sig Dispense Refill  . Biotin 10000 MCG TBDP Take 1 tablet by mouth daily.     Marland Kitchen docusate sodium (COLACE) 100 MG capsule Take 1 capsule (100 mg total) by mouth 2 (two) times daily. 30 capsule 0  . fexofenadine (ALLEGRA) 180 MG tablet Take 180 mg by mouth daily as needed for allergies.     Marland Kitchen ibuprofen (ADVIL,MOTRIN)  800 MG tablet Take 1 tablet (800 mg total) by mouth every 8 (eight) hours as needed. 30 tablet 1  . levothyroxine (SYNTHROID, LEVOTHROID) 112 MCG tablet Take 112 mcg by mouth daily before breakfast.    . lisinopril-hydrochlorothiazide (PRINZIDE,ZESTORETIC) 20-25 MG per tablet Take 1 tablet by mouth daily.    . rosuvastatin (CRESTOR) 5 MG tablet Take 5 mg by mouth every morning.     . vitamin B-12 (CYANOCOBALAMIN) 1000 MCG tablet Take 1,000 mcg by mouth daily.      No current facility-administered medications for this visit.      ALLERGIES: Codeine and Doxycycline  Family History  Problem Relation Age of Onset  . Diabetes type II Father   . CAD Father   . Alzheimer's disease Father   . Alzheimer's  disease Mother   . Breast cancer Neg Hx     Social History   Socioeconomic History  . Marital status: Married    Spouse name: Not on file  . Number of children: Not on file  . Years of education: Not on file  . Highest education level: Not on file  Occupational History  . Not on file  Social Needs  . Financial resource strain: Not on file  . Food insecurity:    Worry: Not on file    Inability: Not on file  . Transportation needs:    Medical: Not on file    Non-medical: Not on file  Tobacco Use  . Smoking status: Never Smoker  . Smokeless tobacco: Never Used  Substance and Sexual Activity  . Alcohol use: Yes    Alcohol/week: 2.4 - 3.6 oz    Types: 4 - 6 Standard drinks or equivalent per week    Comment: Occ  . Drug use: No  . Sexual activity: Not on file  Lifestyle  . Physical activity:    Days per week: Not on file    Minutes per session: Not on file  . Stress: Not on file  Relationships  . Social connections:    Talks on phone: Not on file    Gets together: Not on file    Attends religious service: Not on file    Active member of club or organization: Not on file    Attends meetings of clubs or organizations: Not on file    Relationship status: Not on file  . Intimate partner violence:    Fear of current or ex partner: Not on file    Emotionally abused: Not on file    Physically abused: Not on file    Forced sexual activity: Not on file  Other Topics Concern  . Not on file  Social History Narrative  . Not on file    Review of Systems  Constitutional: Negative.   HENT: Negative.   Eyes: Negative.   Respiratory: Negative.   Cardiovascular: Negative.   Gastrointestinal: Negative.   Genitourinary:       Vaginal discharge and vaginal lump   Musculoskeletal: Negative.   Skin: Negative.   Neurological: Negative.   Endo/Heme/Allergies: Negative.   Psychiatric/Behavioral: Negative.     PHYSICAL EXAMINATION:    BP 118/60 (BP Location: Right Arm,  Patient Position: Sitting, Cuff Size: Normal)   Pulse 64   Resp 16   Wt 169 lb (76.7 kg)   BMI 31.93 kg/m     General appearance: alert, cooperative and appears stated age Abdomen: soft, non-tender; non distended, no masses,  no organomegaly  Pelvic: External genitalia:  no lesions  Urethra:  normal appearing urethra with no masses, tenderness or lesions              Bartholins and Skenes: normal                 Vagina: atrophic, healing well, the area of the "lump" is a atrophic, raw appearing area at the introitus posteriorly. Stitches still in place. Vagina is not tender. There is friable area at the vaginal apex, treated with silver nitrate.              Cervix: absent              Bimanual Exam:  Uterus:  uterus absent              Adnexa: no mass, fullness, tenderness               Chaperone was present for exam.  ASSESSMENT 6 weeks s/p TVH/A&P repair, uterosacral ligament suspension. Doing well, still healing, vagina is atrophic C/O vaginal d/c, think post op from healing    PLAN Affirm sent Vaginal apex, friable, treated with silver nitrate Start vaginal estrogen F/U in one month Can go back to work, no intercourse until after her f/u visit   An After Visit Summary was printed and given to the patient.

## 2017-04-24 LAB — VAGINITIS/VAGINOSIS, DNA PROBE
CANDIDA SPECIES: NEGATIVE
Gardnerella vaginalis: NEGATIVE
Trichomonas vaginosis: NEGATIVE

## 2017-05-22 ENCOUNTER — Telehealth: Payer: Self-pay | Admitting: Obstetrics and Gynecology

## 2017-05-22 NOTE — Telephone Encounter (Signed)
Patient had surgery in February and her spouse had FMLA paper work that was dropped off at our office. Patient did pay for FMLA completion fee 03/23/17. Patient said they never received the FMLA. Forms.

## 2017-05-23 NOTE — Telephone Encounter (Signed)
Spoke with patient regarding FMLA forms for her husband. Advised patient forms were completed and mailed on 04/05/17 to [place], [place] [address], as requested by patient. Patient requested we re-sent copies to the same post office box. Copies placed in mail today.

## 2017-05-24 ENCOUNTER — Ambulatory Visit (INDEPENDENT_AMBULATORY_CARE_PROVIDER_SITE_OTHER): Payer: PRIVATE HEALTH INSURANCE | Admitting: Obstetrics and Gynecology

## 2017-05-24 ENCOUNTER — Other Ambulatory Visit: Payer: Self-pay

## 2017-05-24 ENCOUNTER — Encounter: Payer: Self-pay | Admitting: Obstetrics and Gynecology

## 2017-05-24 VITALS — BP 128/60 | HR 72 | Resp 16 | Wt 170.0 lb

## 2017-05-24 DIAGNOSIS — Z90710 Acquired absence of both cervix and uterus: Secondary | ICD-10-CM

## 2017-05-24 DIAGNOSIS — N952 Postmenopausal atrophic vaginitis: Secondary | ICD-10-CM

## 2017-05-24 DIAGNOSIS — N393 Stress incontinence (female) (male): Secondary | ICD-10-CM | POA: Diagnosis not present

## 2017-05-24 MED ORDER — ESTRADIOL 10 MCG VA TABS: 1.0000 | ORAL_TABLET | VAGINAL | 2 refills | Status: DC

## 2017-05-24 NOTE — Progress Notes (Signed)
GYNECOLOGY  VISIT   HPI: 67 y.o.   Married  Caucasian  female   G1P1001 with No LMP recorded. Patient has had a hysterectomy.   here for follow up 10 weeks s/p TVH/A&P repair, uterosacral ligament suspension. She was started on vaginal estrogen at her last visit for atrophy. Overall feeling well. Some mild GSI. Normal BM. If she puts her finger vaginally she feels some roughness on the right.  She used the vaginal estrogen, ran out (the tube kept leaking). Her vaginal d/c stopped as soon as she started using it.       GYNECOLOGIC HISTORY: No LMP recorded. Patient has had a hysterectomy. Contraception: hysterectomy  Menopausal hormone therapy: none        OB History    Gravida  1   Para  1   Term  1   Preterm      AB      Living  1     SAB      TAB      Ectopic      Multiple      Live Births  1              Patient Active Problem List   Diagnosis Date Noted  . H/O total vaginal hysterectomy 03/12/2017  . Essential hypertension 03/08/2017  . Mixed dyslipidemia 03/08/2017  . Abnormal EKG 03/08/2017  . Preoperative cardiovascular examination 03/08/2017  . Allergic state 12/11/2013  . High cholesterol 12/11/2013  . History of depression 12/11/2013  . Hypothyroidism, unspecified 12/11/2013  . Sleep apnea 12/11/2013    Past Medical History:  Diagnosis Date  . Arthritis   . Hyperlipemia   . Hypertension   . Hypothyroidism   . Sleep apnea   . Vertigo     Past Surgical History:  Procedure Laterality Date  . ABDOMINAL HYSTERECTOMY    . ANTERIOR AND POSTERIOR REPAIR N/A 03/12/2017   Procedure: ANTERIOR (CYSTOCELE) AND POSTERIOR REPAIR (RECTOCELE);  Surgeon: Salvadore Dom, MD;  Location: [facility];  Service: Gynecology;  Laterality: N/A;  . CARPAL TUNNEL RELEASE Bilateral   . CHOLECYSTECTOMY    . CYSTOSCOPY N/A 03/12/2017   Procedure: CYSTOSCOPY;  Surgeon: Salvadore Dom, MD;  Location: [facility];  Service:  Gynecology;  Laterality: N/A;  . EYE SURGERY    . KNEE ARTHROSCOPY Left   . LACRIMAL DUCT PROBING W/ DACRYOPLASTY Bilateral   . SALPINGOOPHORECTOMY Left 03/12/2017   Procedure: SALPINGECTOMY;  Surgeon: Salvadore Dom, MD;  Location: [facility];  Service: Gynecology;  Laterality: Left;  . SHOULDER ARTHROSCOPY WITH OPEN ROTATOR CUFF REPAIR Right 09/03/2014   Procedure: SHOULDER ARTHROSCOPY WITH OPEN ROTATOR CUFF REPAIR;  Surgeon: Thornton Park, MD;  Location: [facility];  Service: Orthopedics;  Laterality: Right;  . THYROIDECTOMY Right    hemi-thyroidectomy  . VAGINAL HYSTERECTOMY N/A 03/12/2017   Procedure: HYSTERECTOMY VAGINAL;  Surgeon: Salvadore Dom, MD;  Location: [facility];  Service: Gynecology;  Laterality: N/A;    Current Outpatient Medications  Medication Sig Dispense Refill  . Biotin 10000 MCG TBDP Take 1 tablet by mouth daily.     Marland Kitchen docusate sodium (COLACE) 100 MG capsule Take 1 capsule (100 mg total) by mouth 2 (two) times daily. 30 capsule 0  . fexofenadine (ALLEGRA) 180 MG tablet Take 180 mg by mouth daily as needed for allergies.     Marland Kitchen ibuprofen (ADVIL,MOTRIN) 800 MG tablet Take 1 tablet (800 mg total) by mouth  every 8 (eight) hours as needed. 30 tablet 1  . levothyroxine (SYNTHROID, LEVOTHROID) 112 MCG tablet Take 112 mcg by mouth daily before breakfast.    . lisinopril-hydrochlorothiazide (PRINZIDE,ZESTORETIC) 20-25 MG per tablet Take 1 tablet by mouth daily.    . rosuvastatin (CRESTOR) 5 MG tablet Take 5 mg by mouth every morning.     . vitamin B-12 (CYANOCOBALAMIN) 1000 MCG tablet Take 1,000 mcg by mouth daily.     Marland Kitchen estradiol (ESTRACE) 0.1 MG/GM vaginal cream 1 gram vaginally every night for a week, then twice weekly (Patient not taking: Reported on 05/24/2017) 42.5 g 1   No current facility-administered medications for this visit.      ALLERGIES: Codeine and Doxycycline  Family History  Problem Relation Age of Onset  .  Diabetes type II Father   . CAD Father   . Alzheimer's disease Father   . Alzheimer's disease Mother   . Breast cancer Neg Hx     Social History   Socioeconomic History  . Marital status: Married    Spouse name: Not on file  . Number of children: Not on file  . Years of education: Not on file  . Highest education level: Not on file  Occupational History  . Not on file  Social Needs  . Financial resource strain: Not on file  . Food insecurity:    Worry: Not on file    Inability: Not on file  . Transportation needs:    Medical: Not on file    Non-medical: Not on file  Tobacco Use  . Smoking status: Never Smoker  . Smokeless tobacco: Never Used  Substance and Sexual Activity  . Alcohol use: Yes    Alcohol/week: 2.4 - 3.6 oz    Types: 4 - 6 Standard drinks or equivalent per week    Comment: Occ  . Drug use: No  . Sexual activity: Not on file  Lifestyle  . Physical activity:    Days per week: Not on file    Minutes per session: Not on file  . Stress: Not on file  Relationships  . Social connections:    Talks on phone: Not on file    Gets together: Not on file    Attends religious service: Not on file    Active member of club or organization: Not on file    Attends meetings of clubs or organizations: Not on file    Relationship status: Not on file  . Intimate partner violence:    Fear of current or ex partner: Not on file    Emotionally abused: Not on file    Physically abused: Not on file    Forced sexual activity: Not on file  Other Topics Concern  . Not on file  Social History Narrative  . Not on file    Review of Systems  Constitutional: Negative.   HENT: Negative.   Eyes: Negative.   Respiratory: Negative.   Cardiovascular: Negative.   Gastrointestinal: Negative.   Genitourinary: Negative.   Musculoskeletal: Negative.   Skin: Negative.   Neurological: Negative.   Endo/Heme/Allergies: Negative.   Psychiatric/Behavioral: Negative.     PHYSICAL  EXAMINATION:    BP 128/60 (BP Location: Right Arm, Patient Position: Sitting, Cuff Size: Normal)   Pulse 72   Resp 16   Wt 170 lb (77.1 kg)   BMI 32.12 kg/m     General appearance: alert, cooperative and appears stated age  Pelvic: External genitalia:  no lesions  Urethra:  normal appearing urethra with no masses, tenderness or lesions              Bartholins and Skenes: normal                 Vagina: normal appearing vagina with normal color and discharge, no lesions. Well healed, atrophy is much improved. No prolapse.              Cervix: absent, stitches still present at the cuff as expected.               Bimanual Exam:  Uterus:  uterus absent              Adnexa: no mass, fullness, tenderness               Chaperone was present for exam.  ASSESSMENT 10 weeks s/p TVH/A&P repair, uterosacral ligament suspension. Doing well Vaginal atrophy, improved GSI, mild    PLAN Will change to the generic vagifem tablets Can be sexually active, use lubrication Kegel information given F/U in 12/19 for annual exam   An After Visit Summary was printed and given to the patient.

## 2017-05-24 NOTE — Patient Instructions (Signed)

## 2017-06-12 ENCOUNTER — Other Ambulatory Visit: Payer: Self-pay

## 2017-06-12 ENCOUNTER — Encounter: Payer: Self-pay | Admitting: Emergency Medicine

## 2017-06-12 ENCOUNTER — Ambulatory Visit
Admission: EM | Admit: 2017-06-12 | Discharge: 2017-06-12 | Disposition: A | Discharge status: Home / Self Care | Payer: PRIVATE HEALTH INSURANCE | Attending: Family Medicine | Admitting: Family Medicine

## 2017-06-12 DIAGNOSIS — M791 Myalgia, unspecified site: Secondary | ICD-10-CM

## 2017-06-12 DIAGNOSIS — J029 Acute pharyngitis, unspecified: Secondary | ICD-10-CM | POA: Diagnosis not present

## 2017-06-12 LAB — RAPID STREP SCREEN (MED CTR MEBANE ONLY): Streptococcus, Group A Screen (Direct): NEGATIVE

## 2017-06-12 MED ORDER — LIDOCAINE VISCOUS HCL 2 % MT SOLN: OROMUCOSAL | 0 refills | Status: DC

## 2017-06-12 NOTE — ED Provider Notes (Signed)
[facility]   CSN: [id number] Arrival date & time: 06/12/17  0805   History   Chief Complaint Chief Complaint  Patient presents with  . Sore Throat   HPI  67 year old female presents with sore throat and body aches.  Started yesterday.  She reports severe sore throat and associated body aches.  No fever.  No known exacerbating relieving factors.  No reported sick contacts.  No other associated symptoms.  No other complaints or concerns at this time.  Past Medical History:  Diagnosis Date  . Arthritis   . Hyperlipemia   . Hypertension   . Hypothyroidism   . Sleep apnea   . Vertigo    Patient Active Problem List   Diagnosis Date Noted  . H/O total vaginal hysterectomy 03/12/2017  . Essential hypertension 03/08/2017  . Mixed dyslipidemia 03/08/2017  . Abnormal EKG 03/08/2017  . Preoperative cardiovascular examination 03/08/2017  . Allergic state 12/11/2013  . High cholesterol 12/11/2013  . History of depression 12/11/2013  . Hypothyroidism, unspecified 12/11/2013  . Sleep apnea 12/11/2013   Past Surgical History:  Procedure Laterality Date  . ABDOMINAL HYSTERECTOMY    . ANTERIOR AND POSTERIOR REPAIR N/A 03/12/2017   Procedure: ANTERIOR (CYSTOCELE) AND POSTERIOR REPAIR (RECTOCELE);  Surgeon: Salvadore Dom, MD;  Location: [facility];  Service: Gynecology;  Laterality: N/A;  . CARPAL TUNNEL RELEASE Bilateral   . CHOLECYSTECTOMY    . CYSTOSCOPY N/A 03/12/2017   Procedure: CYSTOSCOPY;  Surgeon: Salvadore Dom, MD;  Location: [facility];  Service: Gynecology;  Laterality: N/A;  . EYE SURGERY    . KNEE ARTHROSCOPY Left   . LACRIMAL DUCT PROBING W/ DACRYOPLASTY Bilateral   . SALPINGOOPHORECTOMY Left 03/12/2017   Procedure: SALPINGECTOMY;  Surgeon: Salvadore Dom, MD;  Location: [facility];  Service: Gynecology;  Laterality: Left;  . SHOULDER ARTHROSCOPY WITH OPEN ROTATOR CUFF REPAIR Right 09/03/2014   Procedure: SHOULDER ARTHROSCOPY WITH OPEN ROTATOR CUFF REPAIR;  Surgeon: Thornton Park, MD;  Location: [facility];  Service: Orthopedics;  Laterality: Right;  . THYROIDECTOMY Right    hemi-thyroidectomy  . VAGINAL HYSTERECTOMY N/A 03/12/2017   Procedure: HYSTERECTOMY VAGINAL;  Surgeon: Salvadore Dom, MD;  Location: [facility];  Service: Gynecology;  Laterality: N/A;   OB History    Gravida  1   Para  1   Term  1   Preterm      AB      Living  1     SAB      TAB      Ectopic      Multiple      Live Births  1          Home Medications    Prior to Admission medications   Medication Sig Start Date End Date Taking? Authorizing Provider  Biotin 10000 MCG TBDP Take 1 tablet by mouth daily.    Yes [provider]  Estradiol 10 MCG TABS vaginal tablet Place 1 tablet (10 mcg total) vaginally 2 (two) times a week. 05/24/17  Yes Salvadore Dom, MD  fexofenadine (ALLEGRA) 180 MG tablet Take 180 mg by mouth daily as needed for allergies.    Yes [provider]  levothyroxine (SYNTHROID, LEVOTHROID) 112 MCG tablet Take 112 mcg by mouth daily before breakfast.   Yes [provider]  lisinopril-hydrochlorothiazide (PRINZIDE,ZESTORETIC) 20-25 MG per tablet Take 1 tablet by mouth daily.   Yes [provider]  rosuvastatin (CRESTOR)  5 MG tablet Take 5 mg by mouth every morning.  07/01/15 06/12/17 Yes [provider]  vitamin B-12 (CYANOCOBALAMIN) 1000 MCG tablet Take 1,000 mcg by mouth daily.    Yes [provider]  docusate sodium (COLACE) 100 MG capsule Take 1 capsule (100 mg total) by mouth 2 (two) times daily. 03/13/17   Salvadore Dom, MD  ibuprofen (ADVIL,MOTRIN) 800 MG tablet Take 1 tablet (800 mg total) by mouth every 8 (eight) hours as needed. 03/13/17   Salvadore Dom, MD  lidocaine (XYLOCAINE) 2 % solution Gargle 15 mL every 3 hours as needed. May swallow if desired. 06/12/17   Coral Spikes,  DO   Family History Family History  Problem Relation Age of Onset  . Diabetes type II Father   . CAD Father   . Alzheimer's disease Father   . Alzheimer's disease Mother   . Breast cancer Neg Hx    Social History Social History   Tobacco Use  . Smoking status: Never Smoker  . Smokeless tobacco: Never Used  Substance Use Topics  . Alcohol use: Yes    Alcohol/week: 2.4 - 3.6 oz    Types: 4 - 6 Standard drinks or equivalent per week    Comment: Occ  . Drug use: No   Allergies   Codeine and Doxycycline   Review of Systems Review of Systems  Constitutional: Negative for fever.  HENT: Positive for sore throat.   Musculoskeletal:       Body aches.   Physical Exam Triage Vital Signs ED Triage Vitals  Enc Vitals Group     BP 06/12/17 0818 (!) 131/92     Pulse Rate 06/12/17 0818 68     Resp 06/12/17 0818 16     Temp 06/12/17 0818 98.4 F (36.9 C)     Temp Source 06/12/17 0818 Oral     SpO2 06/12/17 0818 99 %     Weight 06/12/17 0815 170 lb (77.1 kg)     Height 06/12/17 0815 5\' 1"  (1.549 m)     Head Circumference --      Peak Flow --      Pain Score 06/12/17 0814 7     Pain Loc --      Pain Edu? --      Excl. in [place]? --    Updated Vital Signs BP (!) 131/92 (BP Location: Left Arm)   Pulse 68   Temp 98.4 F (36.9 C) (Oral)   Resp 16   Ht 5\' 1"  (1.549 m)   Wt 170 lb (77.1 kg)   SpO2 99%   BMI 32.12 kg/m      Physical Exam  Constitutional: She is oriented to person, place, and time. She appears well-developed. No distress.  HENT:  Oropharynx with mild erythema. Normal TM's.  Cardiovascular: Normal rate and regular rhythm.  Pulmonary/Chest: Effort normal and breath sounds normal. She has no wheezes. She has no rales.  Neurological: She is alert and oriented to person, place, and time.  Psychiatric: She has a normal mood and affect. Her behavior is normal.  Nursing note and vitals reviewed.  UC Treatments / Results  Labs (all labs ordered are listed, but  only abnormal results are displayed) Labs Reviewed  RAPID STREP SCREEN ([facility] ONLY)  CULTURE, GROUP A STREP [facility])   EKG None  Radiology No results found.  Procedures Procedures (including critical care time)  Medications Ordered in UC Medications - No data to display  Initial  Impression / Assessment and Plan / UC Course  I have reviewed the triage vital signs and the nursing notes.  Pertinent labs & imaging results that were available during my care of the patient were reviewed by me and considered in my medical decision making (see chart for details).    67 year old female presents with viral pharyngitis.  Strep negative.  Treating with viscous lidocaine, Tylenol, ibuprofen.  Final Clinical Impressions(s) / UC Diagnoses   Final diagnoses:  Viral pharyngitis     Discharge Instructions     Rest, fluids.  Tylenol and motrin as needed.  Viscous lidocaine for severe sore throat.  Take care  Dr. Lacinda Axon     ED Prescriptions    Medication Sig Dispense Auth. Provider   lidocaine (XYLOCAINE) 2 % solution Gargle 15 mL every 3 hours as needed. May swallow if desired. 100 mL Coral Spikes, DO     Controlled Substance Prescriptions [place] Controlled Substance Registry consulted? Not Applicable  Coral Spikes, DO 06/12/17 [phone]

## 2017-06-12 NOTE — Discharge Instructions (Signed)
Rest, fluids.  Tylenol and motrin as needed.  Viscous lidocaine for severe sore throat.  Take care  Dr. Lacinda Axon

## 2017-06-12 NOTE — ED Triage Notes (Signed)
Patient c/o sore throat that started yesterday.  

## 2017-06-14 LAB — CULTURE, GROUP A STREP (THRC)

## 2017-10-30 NOTE — Telephone Encounter (Signed)
error 

## 2017-11-12 ENCOUNTER — Other Ambulatory Visit: Payer: Self-pay | Admitting: Internal Medicine

## 2017-11-12 DIAGNOSIS — Z1231 Encounter for screening mammogram for malignant neoplasm of breast: Secondary | ICD-10-CM

## 2017-12-07 ENCOUNTER — Ambulatory Visit
Admission: RE | Admit: 2017-12-07 | Discharge: 2017-12-07 | Disposition: A | Discharge status: Home / Self Care | Payer: PRIVATE HEALTH INSURANCE | Source: Ambulatory Visit | Attending: Internal Medicine | Admitting: Internal Medicine

## 2017-12-07 DIAGNOSIS — Z1231 Encounter for screening mammogram for malignant neoplasm of breast: Secondary | ICD-10-CM | POA: Diagnosis present

## 2018-01-08 NOTE — Progress Notes (Signed)
67 y.o. G7P1001 Married White or Caucasian Not Hispanic or Latino female here for annual exam.  H/O TVH, A&P repair, Uterosacral ligament suspension in 2/19. She is using vaginal estrogen, no dyspareunia. She is having some GSI, small amounts daily, wears a mini-pad. No bowel complaints. No recurrent prolapse.     No LMP recorded. Patient has had a hysterectomy.          Sexually active: Yes.    The current method of family planning is status post hysterectomy.    Exercising: No.  The patient does not participate in regular exercise at present. Smoker:  no  Health Maintenance: Pap:  07/2015 WNL per patient  History of abnormal Pap:  no MMG: 12/07/2017 [place] 1 negative Colonoscopy:  10/2016 Normal per patient  BMD:   Never TDaP:  Up to date  Gardasil: N/A   reports that she has never smoked. She has never used smokeless tobacco. She reports current alcohol use of about 3.0 - 4.0 standard drinks of alcohol per week. She reports that she does not use drugs.  She is an OR nurse at [employer], does mostly eye surgery. Only works part time, no more call. She has a daughter who lives in [place], no grand children.   Past Medical History:  Diagnosis Date  . Arthritis   . Hyperlipemia   . Hypertension   . Hypothyroidism   . Sleep apnea   . Vertigo     Past Surgical History:  Procedure Laterality Date  . ANTERIOR AND POSTERIOR REPAIR N/A 03/12/2017   Procedure: ANTERIOR (CYSTOCELE) AND POSTERIOR REPAIR (RECTOCELE);  Surgeon: Salvadore Dom, MD;  Location: [facility];  Service: Gynecology;  Laterality: N/A;  . CARPAL TUNNEL RELEASE Bilateral   . CHOLECYSTECTOMY    . CYSTOSCOPY N/A 03/12/2017   Procedure: CYSTOSCOPY;  Surgeon: Salvadore Dom, MD;  Location: [facility];  Service: Gynecology;  Laterality: N/A;  . EYE SURGERY    . KNEE ARTHROSCOPY Left   . LACRIMAL DUCT PROBING W/ DACRYOPLASTY Bilateral   . SALPINGOOPHORECTOMY Left 03/12/2017    Procedure: SALPINGECTOMY;  Surgeon: Salvadore Dom, MD;  Location: [facility];  Service: Gynecology;  Laterality: Left;  . SHOULDER ARTHROSCOPY WITH OPEN ROTATOR CUFF REPAIR Right 09/03/2014   Procedure: SHOULDER ARTHROSCOPY WITH OPEN ROTATOR CUFF REPAIR;  Surgeon: Thornton Park, MD;  Location: [facility];  Service: Orthopedics;  Laterality: Right;  . THYROIDECTOMY Right    hemi-thyroidectomy  . VAGINAL HYSTERECTOMY N/A 03/12/2017   Procedure: HYSTERECTOMY VAGINAL;  Surgeon: Salvadore Dom, MD;  Location: [facility];  Service: Gynecology;  Laterality: N/A;    Current Outpatient Medications  Medication Sig Dispense Refill  . Biotin 10000 MCG TBDP Take 1 tablet by mouth daily.     . Estradiol 10 MCG TABS vaginal tablet Place 1 tablet (10 mcg total) vaginally 2 (two) times a week. 24 tablet 2  . fexofenadine (ALLEGRA) 180 MG tablet Take 180 mg by mouth daily as needed for allergies.     Marland Kitchen ibuprofen (ADVIL,MOTRIN) 800 MG tablet Take 1 tablet (800 mg total) by mouth every 8 (eight) hours as needed. 30 tablet 1  . levothyroxine (SYNTHROID, LEVOTHROID) 100 MCG tablet Take 100 mcg by mouth daily before breakfast.    . lisinopril-hydrochlorothiazide (PRINZIDE,ZESTORETIC) 20-25 MG per tablet Take 1 tablet by mouth daily.    . vitamin B-12 (CYANOCOBALAMIN) 1000 MCG tablet Take 1,000 mcg by mouth daily.     . rosuvastatin (  CRESTOR) 5 MG tablet Take 5 mg by mouth every morning.      No current facility-administered medications for this visit.     Family History  Problem Relation Age of Onset  . Diabetes type II Father   . CAD Father   . Alzheimer's disease Father   . Alzheimer's disease Mother   . Breast cancer Neg Hx     Review of Systems  Constitutional: Negative.   HENT: Negative.   Eyes: Negative.   Respiratory: Negative.   Cardiovascular: Negative.   Gastrointestinal: Negative.   Endocrine: Negative.   Genitourinary:       Loss of urine with  sneeze/cough Loss of interest in intercourse  Musculoskeletal: Negative.   Skin: Negative.   Allergic/Immunologic: Negative.   Neurological: Negative.   Hematological: Negative.   Psychiatric/Behavioral: Negative.     Exam:   BP 110/70 (BP Location: Right Arm, Patient Position: Sitting, Cuff Size: Normal)   Pulse 68   Ht 5\' 1"  (1.549 m)   Wt 160 lb 12.8 oz (72.9 kg)   BMI 30.38 kg/m   Weight change: @WEIGHTCHANGE @ Height:   Height: 5\' 1"  (154.9 cm)  Ht Readings from Last 3 Encounters:  01/10/18 5\' 1"  (1.549 m)  06/12/17 5\' 1"  (1.549 m)  03/12/17 5\' 1"  (1.549 m)    General appearance: alert, cooperative and appears stated age Head: Normocephalic, without obvious abnormality, atraumatic Neck: no adenopathy, supple, symmetrical, trachea midline and thyroid normal to inspection and palpation Lungs: clear to auscultation bilaterally Cardiovascular: regular rate and rhythm Breasts: normal appearance, no masses or tenderness Abdomen: soft, non-tender; non distended,  no masses,  no organomegaly Extremities: extremities normal, atraumatic, no cyanosis or edema Skin: Skin color, texture, turgor normal. No rashes or lesions Lymph nodes: Cervical, supraclavicular, and axillary nodes normal. No abnormal inguinal nodes palpated Neurologic: Grossly normal   Pelvic: External genitalia:  no lesions              Urethra:  normal appearing urethra with no masses, tenderness or lesions              Bartholins and Skenes: normal                 Vagina: normal appearing vagina with normal color and discharge, no lesions. Well estrogenized, excellent support of the vaginal apex. No significant vaginal prolapse.               Cervix: absent               Bimanual Exam:  Uterus:  uterus absent              Adnexa: no mass, fullness, tenderness               Rectovaginal: Confirms               Anus:  normal sphincter tone, no lesions  Chaperone was present for exam.  A:  Well Woman with  normal exam  GSI, tolerable  P:   No pap needed   Discussed breast self exam  Discussed calcium and vit D intake  Labs with primary  She will do her dexa with her primary  Mammogram and colonoscopy UTD

## 2018-01-10 ENCOUNTER — Encounter: Payer: Self-pay | Admitting: Obstetrics and Gynecology

## 2018-01-10 ENCOUNTER — Ambulatory Visit (INDEPENDENT_AMBULATORY_CARE_PROVIDER_SITE_OTHER): Payer: PRIVATE HEALTH INSURANCE | Admitting: Obstetrics and Gynecology

## 2018-01-10 ENCOUNTER — Other Ambulatory Visit: Payer: Self-pay

## 2018-01-10 VITALS — BP 110/70 | HR 68 | Ht 61.0 in | Wt 160.8 lb

## 2018-01-10 DIAGNOSIS — Z01419 Encounter for gynecological examination (general) (routine) without abnormal findings: Secondary | ICD-10-CM | POA: Diagnosis not present

## 2018-01-10 DIAGNOSIS — N393 Stress incontinence (female) (male): Secondary | ICD-10-CM | POA: Diagnosis not present

## 2018-01-10 MED ORDER — ESTRADIOL 10 MCG VA TABS: 1.0000 | ORAL_TABLET | VAGINAL | 3 refills | Status: DC

## 2018-01-10 NOTE — Patient Instructions (Addendum)
Kegel Exercises Kegel exercises help strengthen the muscles that support the rectum, vagina, small intestine, bladder, and uterus. Doing Kegel exercises can help:  Improve bladder and bowel control.  Improve sexual response.  Reduce problems and discomfort during pregnancy.  Kegel exercises involve squeezing your pelvic floor muscles, which are the same muscles you squeeze when you try to stop the flow of urine. The exercises can be done while sitting, standing, or lying down, but it is best to vary your position. Phase 1 exercises 1. Squeeze your pelvic floor muscles tight. You should feel a tight lift in your rectal area. If you are a female, you should also feel a tightness in your vaginal area. Keep your stomach, buttocks, and legs relaxed. 2. Hold the muscles tight for up to 10 seconds. 3. Relax your muscles. Repeat this exercise 50 times a day or as many times as told by your health care provider. Continue to do this exercise for at least 4-6 weeks or for as long as told by your health care provider. This information is not intended to replace advice given to you by your health care provider. Make sure you discuss any questions you have with your health care provider. Document Released: 01/03/2012 Document Revised: 09/11/2015 Document Reviewed: 12/06/2014 Elsevier Interactive Patient Education  2018 [place] AND DIET:  We recommended that you start or continue a regular exercise program for good health. Regular exercise means any activity that makes your heart beat faster and makes you sweat.  We recommend exercising at least 30 minutes per day at least 3 days a week, preferably 4 or 5.  We also recommend a diet low in fat and sugar.  Inactivity, poor dietary choices and obesity can cause diabetes, heart attack, stroke, and kidney damage, among others.    ALCOHOL AND SMOKING:  Women should limit their alcohol intake to no more than 7 drinks/beers/glasses of wine (combined,  not each!) per week. Moderation of alcohol intake to this level decreases your risk of breast cancer and liver damage. And of course, no recreational drugs are part of a healthy lifestyle.  And absolutely no smoking or even second hand smoke. Most people know smoking can cause heart and lung diseases, but did you know it also contributes to weakening of your bones? Aging of your skin?  Yellowing of your teeth and nails?  CALCIUM AND VITAMIN D:  Adequate intake of calcium and Vitamin D are recommended.  The recommendations for exact amounts of these supplements seem to change often, but generally speaking 1,200 mg of calcium (between diet and supplement) and 800 units of Vitamin D per day seems prudent. Certain women may benefit from higher intake of Vitamin D.  If you are among these women, your doctor will have told you during your visit.    PAP SMEARS:  Pap smears, to check for cervical cancer or precancers,  have traditionally been done yearly, although recent scientific advances have shown that most women can have pap smears less often.  However, every woman still should have a physical exam from her gynecologist every year. It will include a breast check, inspection of the vulva and vagina to check for abnormal growths or skin changes, a visual exam of the cervix, and then an exam to evaluate the size and shape of the uterus and ovaries.  And after 67 years of age, a rectal exam is indicated to check for rectal cancers. We will also provide age appropriate advice regarding health maintenance,  like when you should have certain vaccines, screening for sexually transmitted diseases, bone density testing, colonoscopy, mammograms, etc.   MAMMOGRAMS:  All women over 45 years old should have a yearly mammogram. Many facilities now offer a "3D" mammogram, which may cost around $50 extra out of pocket. If possible,  we recommend you accept the option to have the 3D mammogram performed.  It both reduces the number  of women who will be called back for extra views which then turn out to be normal, and it is better than the routine mammogram at detecting truly abnormal areas.    COLONOSCOPY:  Colonoscopy to screen for colon cancer is recommended for all women at age 24.  We know, you hate the idea of the prep.  We agree, BUT, having colon cancer and not knowing it is worse!!  Colon cancer so often starts as a polyp that can be seen and removed at colonscopy, which can quite literally save your life!  And if your first colonoscopy is normal and you have no family history of colon cancer, most women don't have to have it again for 10 years.  Once every ten years, you can do something that may end up saving your life, right?  We will be happy to help you get it scheduled when you are ready.  Be sure to check your insurance coverage so you understand how much it will cost.  It may be covered as a preventative service at no cost, but you should check your particular policy.

## 2018-01-31 ENCOUNTER — Ambulatory Visit
Admission: EM | Admit: 2018-01-31 | Discharge: 2018-01-31 | Disposition: A | Discharge status: Home / Self Care | Payer: PRIVATE HEALTH INSURANCE | Attending: Family Medicine | Admitting: Family Medicine

## 2018-01-31 DIAGNOSIS — J069 Acute upper respiratory infection, unspecified: Secondary | ICD-10-CM | POA: Diagnosis not present

## 2018-01-31 DIAGNOSIS — B9789 Other viral agents as the cause of diseases classified elsewhere: Secondary | ICD-10-CM | POA: Insufficient documentation

## 2018-01-31 MED ORDER — PREDNISONE 50 MG PO TABS: ORAL_TABLET | ORAL | 0 refills | Status: DC

## 2018-01-31 MED ORDER — HYDROCOD POLST-CPM POLST ER 10-8 MG/5ML PO SUER: 5.0000 mL | Freq: Every evening | ORAL | 0 refills | Status: DC | PRN

## 2018-01-31 MED ORDER — BENZONATATE 100 MG PO CAPS: 100.0000 mg | ORAL_CAPSULE | Freq: Three times a day (TID) | ORAL | 0 refills | Status: DC | PRN

## 2018-01-31 NOTE — ED Triage Notes (Signed)
Pt here for productive cough since Saturday which is getting worse as the days go on. States she is coughing up yellow mucus and feels as if the cough has went into her chest. Has been taking dayquil without relief.

## 2018-01-31 NOTE — ED Provider Notes (Signed)
[facility]    CSN: [id number] Arrival date & time: 01/31/18  0802  History   Chief Complaint Chief Complaint  Patient presents with  . Cough   HPI  68 year old female presents with respiratory symptoms.  Patient reports that she has been sick since Saturday.  She reports cough, sore throat, headache, fatigue.  No documented fever.  He states that her symptoms appear to be worsening.  Most troublesome symptom is a cough.  She has used DayQuil and NyQuil without resolution.  No known exacerbating or relieving factors.  No other reported symptoms.  No other complaints or concerns at this time.  PMH, Surgical Hx, Family Hx, Social History reviewed and updated as below.  Past Medical History:  Diagnosis Date  . Arthritis   . Hyperlipemia   . Hypertension   . Hypothyroidism   . Sleep apnea   . Vertigo     Patient Active Problem List   Diagnosis Date Noted  . H/O total vaginal hysterectomy 03/12/2017  . Essential hypertension 03/08/2017  . Mixed dyslipidemia 03/08/2017  . Abnormal EKG 03/08/2017  . Preoperative cardiovascular examination 03/08/2017  . Allergic state 12/11/2013  . High cholesterol 12/11/2013  . History of depression 12/11/2013  . Hypothyroidism, unspecified 12/11/2013  . Sleep apnea 12/11/2013    Past Surgical History:  Procedure Laterality Date  . ANTERIOR AND POSTERIOR REPAIR N/A 03/12/2017   Procedure: ANTERIOR (CYSTOCELE) AND POSTERIOR REPAIR (RECTOCELE);  Surgeon: Salvadore Dom, MD;  Location: [facility];  Service: Gynecology;  Laterality: N/A;  . CARPAL TUNNEL RELEASE Bilateral   . CHOLECYSTECTOMY    . CYSTOSCOPY N/A 03/12/2017   Procedure: CYSTOSCOPY;  Surgeon: Salvadore Dom, MD;  Location: [facility];  Service: Gynecology;  Laterality: N/A;  . EYE SURGERY    . KNEE ARTHROSCOPY Left   . LACRIMAL DUCT PROBING W/ DACRYOPLASTY Bilateral   . SALPINGOOPHORECTOMY Left 03/12/2017   Procedure:  SALPINGECTOMY;  Surgeon: Salvadore Dom, MD;  Location: [facility];  Service: Gynecology;  Laterality: Left;  . SHOULDER ARTHROSCOPY WITH OPEN ROTATOR CUFF REPAIR Right 09/03/2014   Procedure: SHOULDER ARTHROSCOPY WITH OPEN ROTATOR CUFF REPAIR;  Surgeon: Thornton Park, MD;  Location: [facility];  Service: Orthopedics;  Laterality: Right;  . THYROIDECTOMY Right    hemi-thyroidectomy  . VAGINAL HYSTERECTOMY N/A 03/12/2017   Procedure: HYSTERECTOMY VAGINAL;  Surgeon: Salvadore Dom, MD;  Location: [facility];  Service: Gynecology;  Laterality: N/A;    OB History    Gravida  1   Para  1   Term  1   Preterm      AB      Living  1     SAB      TAB      Ectopic      Multiple      Live Births  1            Home Medications    Prior to Admission medications   Medication Sig Start Date End Date Taking? Authorizing Provider  Biotin 10000 MCG TBDP Take 1 tablet by mouth daily.    Yes [provider]  Estradiol 10 MCG TABS vaginal tablet Place 1 tablet (10 mcg total) vaginally 2 (two) times a week. 01/10/18  Yes Salvadore Dom, MD  fexofenadine (ALLEGRA) 180 MG tablet Take 180 mg by mouth daily as needed for allergies.    Yes [provider]  ibuprofen (ADVIL,MOTRIN) 800 MG tablet  Take 1 tablet (800 mg total) by mouth every 8 (eight) hours as needed. 03/13/17  Yes Salvadore Dom, MD  levothyroxine (SYNTHROID, LEVOTHROID) 100 MCG tablet Take 100 mcg by mouth daily before breakfast.   Yes [provider]  lisinopril-hydrochlorothiazide (PRINZIDE,ZESTORETIC) 20-25 MG per tablet Take 1 tablet by mouth daily.   Yes [provider]  vitamin B-12 (CYANOCOBALAMIN) 1000 MCG tablet Take 1,000 mcg by mouth daily.    Yes [provider]  benzonatate (TESSALON) 100 MG capsule Take 1 capsule (100 mg total) by mouth 3 (three) times daily as needed. 01/31/18   Coral Spikes, DO    chlorpheniramine-HYDROcodone (TUSSIONEX PENNKINETIC ER) 10-8 MG/5ML SUER Take 5 mLs by mouth at bedtime as needed. 01/31/18   Coral Spikes, DO  predniSONE (DELTASONE) 50 MG tablet 1 tablet daily x 5 days 01/31/18   Coral Spikes, DO  rosuvastatin (CRESTOR) 5 MG tablet Take 5 mg by mouth every morning.  07/01/15 06/12/17  [provider]    Family History Family History  Problem Relation Age of Onset  . Diabetes type II Father   . CAD Father   . Alzheimer's disease Father   . Alzheimer's disease Mother   . Breast cancer Neg Hx     Social History Social History   Tobacco Use  . Smoking status: Never Smoker  . Smokeless tobacco: Never Used  Substance Use Topics  . Alcohol use: Yes    Alcohol/week: 3.0 - 4.0 standard drinks    Types: 3 - 4 Standard drinks or equivalent per week    Comment: Occ  . Drug use: No     Allergies   Codeine and Doxycycline   Review of Systems Review of Systems  Constitutional: Positive for fatigue. Negative for fever.  HENT: Positive for sore throat.   Respiratory: Positive for cough.   Neurological: Positive for headaches.   Physical Exam Triage Vital Signs ED Triage Vitals  Enc Vitals Group     BP 01/31/18 0810 (!) 158/101     Pulse Rate 01/31/18 0810 (!) 103     Resp 01/31/18 0810 18     Temp 01/31/18 0810 98.7 F (37.1 C)     Temp Source 01/31/18 0810 Oral     SpO2 01/31/18 0810 100 %     Weight 01/31/18 0812 162 lb (73.5 kg)     Height 01/31/18 0812 5\' 1"  (1.549 m)     Head Circumference --      Peak Flow --      Pain Score 01/31/18 0812 7     Pain Loc --      Pain Edu? --      Excl. in [place]? --    Updated Vital Signs BP (!) 158/101 (BP Location: Left Arm) Comment: did not take bp medication today  Pulse (!) 103   Temp 98.7 F (37.1 C) (Oral)   Resp 18   Ht 5\' 1"  (1.549 m)   Wt 73.5 kg   SpO2 100%   BMI 30.61 kg/m   Visual Acuity Right Eye Distance:   Left Eye Distance:   Bilateral Distance:    Right Eye  Near:   Left Eye Near:    Bilateral Near:     Physical Exam Vitals signs and nursing note reviewed.  Constitutional:      General: She is not in acute distress. HENT:     Head: Normocephalic and atraumatic.     Right Ear: Tympanic membrane normal.  Left Ear: Tympanic membrane normal.     Mouth/Throat:     Comments: Oropharynx with mild erythema.  No exudate. Cardiovascular:     Rate and Rhythm: Normal rate and regular rhythm.  Pulmonary:     Effort: Pulmonary effort is normal.     Breath sounds: No wheezing, rhonchi or rales.  Neurological:     Mental Status: She is alert.  Psychiatric:        Mood and Affect: Mood normal.        Behavior: Behavior normal.    UC Treatments / Results  Labs (all labs ordered are listed, but only abnormal results are displayed) Labs Reviewed - No data to display  EKG None  Radiology No results found.  Procedures Procedures (including critical care time)  Medications Ordered in UC Medications - No data to display  Initial Impression / Assessment and Plan / UC Course  I have reviewed the triage vital signs and the nursing notes.  Pertinent labs & imaging results that were available during my care of the patient were reviewed by me and considered in my medical decision making (see chart for details).    68 year old female presents with a viral URI with cough.  Treating with prednisone, Tessalon Perles, Tussionex.  Final Clinical Impressions(s) / UC Diagnoses   Final diagnoses:  Viral URI with cough     Discharge Instructions     This is viral.  Your exam is reassuring.  Tessalon perles for cough during the day. Tussionex at night.   The prednisone will help as well.  Take care  Dr. Lacinda Axon    ED Prescriptions    Medication Sig Dispense Auth. Provider   benzonatate (TESSALON) 100 MG capsule Take 1 capsule (100 mg total) by mouth 3 (three) times daily as needed. 30 capsule [place], [place] G, DO    chlorpheniramine-HYDROcodone (TUSSIONEX PENNKINETIC ER) 10-8 MG/5ML SUER Take 5 mLs by mouth at bedtime as needed. 60 mL Antonina Deziel G, DO   predniSONE (DELTASONE) 50 MG tablet 1 tablet daily x 5 days 5 tablet Coral Spikes, DO     Controlled Substance Prescriptions  Controlled Substance Registry consulted? Not Applicable   Coral Spikes, [place] 01/31/18 [phone]

## 2018-01-31 NOTE — Discharge Instructions (Signed)
This is viral.  Your exam is reassuring.  Tessalon perles for cough during the day. Tussionex at night.   The prednisone will help as well.  Take care  Dr. Lacinda Axon

## 2018-02-08 ENCOUNTER — Other Ambulatory Visit: Payer: Self-pay | Admitting: Obstetrics and Gynecology

## 2018-02-13 ENCOUNTER — Telehealth: Payer: Self-pay | Admitting: Obstetrics and Gynecology

## 2018-02-13 NOTE — Telephone Encounter (Signed)
Spoke with [facility] who states that the patient has a current rx for Yuvafem for 1 year that was sent in 12/2017. Able to be filled at this time. Spoke with the patient who verbalizes understanding.   Routing to covering provider and will close encounter.

## 2018-02-13 NOTE — Telephone Encounter (Signed)
Patient requesting a refill on [facility]afem. [facility] [place] [phone].

## 2018-06-18 ENCOUNTER — Telehealth: Payer: Self-pay | Admitting: Obstetrics and Gynecology

## 2018-06-18 NOTE — Telephone Encounter (Signed)
Spoke with patient. Patients current insurance plan covers yuvafem 90 day supply for $15. Is looking to switch to medicare plan, asking if there are any covered generic alternatives to estradiol or yuvafem 10 mcg vag tab?   Advised yuvafem is the generic of Vagifem. Reviewed cost with GoodRx coupon with patient and options such as estrace vaginal cream or compounded vaginal creams. Recommended patient check her plan for covered alternatives on formulary, can then review options with Dr. Talbert Nan.   Patient does not need new Rx at this time, just checking on possible options. Patient will return call to office if any additional questions or assistance needed.   Routing to provider for final review. Patient is agreeable to disposition. Will close encounter.

## 2018-06-18 NOTE — Telephone Encounter (Signed)
Patient is taking yuvafem. Due to insurance change, she would like to know if there is a generic brand that she could switch to and receive the same results.

## 2018-06-25 ENCOUNTER — Telehealth: Payer: Self-pay | Admitting: Obstetrics and Gynecology

## 2018-06-25 NOTE — Telephone Encounter (Signed)
VOID

## 2018-06-26 ENCOUNTER — Telehealth: Payer: Self-pay | Admitting: Obstetrics and Gynecology

## 2018-06-26 MED ORDER — ESTROGENS, CONJUGATED 0.625 MG/GM VA CREA: TOPICAL_CREAM | VAGINAL | 1 refills | Status: DC

## 2018-06-26 NOTE — Telephone Encounter (Signed)
Premarin cream sent to her pharmacy

## 2018-06-26 NOTE — Telephone Encounter (Signed)
Patient would like to speak with nurse about a medication change.

## 2018-06-26 NOTE — Telephone Encounter (Signed)
Spoke with patient. Currently on yuvafem twice wkly, will be changing insurance plans 07/2018. Medication cost will be $100 for 1 mo supply. Patient states estrace cream will be Tier 4 medication. Patient is requesting an alternative medication. Reviewed option of compounded vaginal estrace cream or premarin vaginal cream with savings card, patient agreeable if appropriate alternative. Advised I will review with Dr. Talbert Nan and return call with recommendations. Patient has 1.5 mo of medication remaining.   Dr. Talbert Nan -please advise on alternative Rx.

## 2018-06-27 NOTE — Telephone Encounter (Signed)
Spoke with patient, advised of premarin cream Rx. Patient aware savings card can not be used with Medicare plan. Patient will check on cost of medication when her new plan begins and return call if alternative medication is needed. Patient verbalizes understanding and is agreeable.   Encounter closed.

## 2018-11-27 ENCOUNTER — Other Ambulatory Visit: Payer: Self-pay

## 2018-11-27 ENCOUNTER — Encounter: Payer: Self-pay | Admitting: Physical Therapy

## 2018-11-27 ENCOUNTER — Ambulatory Visit: Payer: Medicare Other | Attending: Orthopedic Surgery | Admitting: Physical Therapy

## 2018-11-27 DIAGNOSIS — M6281 Muscle weakness (generalized): Secondary | ICD-10-CM | POA: Insufficient documentation

## 2018-11-27 DIAGNOSIS — M25561 Pain in right knee: Secondary | ICD-10-CM | POA: Diagnosis not present

## 2018-11-27 DIAGNOSIS — R262 Difficulty in walking, not elsewhere classified: Secondary | ICD-10-CM | POA: Diagnosis present

## 2018-11-27 NOTE — Therapy (Signed)
[facility] [address] [place], [place], [address] Phone: [phone]   Fax:  [phone]  Physical Therapy Evaluation  Patient Details  Name: Jordan Palmer MRN: [id number] Date of Birth: 03-05-1950 Referring Provider (PT): Mack Guise   Encounter Date: 11/27/2018  PT End of Session - 11/27/18 0801    Visit Number  1    Number of Visits  13    Date for PT Re-Evaluation  01/08/19    Authorization Type  IE 11/27/2018    PT Start Time  0805    PT Stop Time  0900    PT Time Calculation (min)  55 min    Activity Tolerance  Patient tolerated treatment well;Patient limited by pain    Behavior During Therapy  [facility] for tasks assessed/performed       Past Medical History:  Diagnosis Date  . Arthritis   . Hyperlipemia   . Hypertension   . Hypothyroidism   . Sleep apnea   . Vertigo     Past Surgical History:  Procedure Laterality Date  . ANTERIOR AND POSTERIOR REPAIR N/A 03/12/2017   Procedure: ANTERIOR (CYSTOCELE) AND POSTERIOR REPAIR (RECTOCELE);  Surgeon: Salvadore Dom, MD;  Location: [facility];  Service: Gynecology;  Laterality: N/A;  . CARPAL TUNNEL RELEASE Bilateral   . CHOLECYSTECTOMY    . CYSTOSCOPY N/A 03/12/2017   Procedure: CYSTOSCOPY;  Surgeon: Salvadore Dom, MD;  Location: [facility];  Service: Gynecology;  Laterality: N/A;  . EYE SURGERY    . KNEE ARTHROSCOPY Left   . LACRIMAL DUCT PROBING W/ DACRYOPLASTY Bilateral   . SALPINGOOPHORECTOMY Left 03/12/2017   Procedure: SALPINGECTOMY;  Surgeon: Salvadore Dom, MD;  Location: [facility];  Service: Gynecology;  Laterality: Left;  . SHOULDER ARTHROSCOPY WITH OPEN ROTATOR CUFF REPAIR Right 09/03/2014   Procedure: SHOULDER ARTHROSCOPY WITH OPEN ROTATOR CUFF REPAIR;  Surgeon: Thornton Park, MD;  Location: [facility];  Service: Orthopedics;  Laterality: Right;  . THYROIDECTOMY Right    hemi-thyroidectomy  .  VAGINAL HYSTERECTOMY N/A 03/12/2017   Procedure: HYSTERECTOMY VAGINAL;  Surgeon: Salvadore Dom, MD;  Location: [facility];  Service: Gynecology;  Laterality: N/A;    There were no vitals filed for this visit.    [facility] PT Assessment - 11/27/18 0001      Assessment   Medical Diagnosis  R knee pain    Referring Provider (PT)  Krasinski    Hand Dominance  Right       SUBJECTIVE Chief complaint:  Patient reports that back in September she stepped up on a barstool in her kitchen and her R knee gave out and she had 10/10 pain. And that pain and inability to WB on the RLE lasted for 10 minutes. She has continued to limp and hurt since then. She is currently wearing a soft knee brace to stabilize the knee.  Patient gets cortisone injections every 3-4 months for pain in B knees. 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 Patient notes that bending is more painful than extending. She cannot presently squat or kneel down.  Patient reports that with prolonged sitting she has increased pain with standing. Denies any falls since her injury. Patient did take pain medication prior to this evaluation and expressed some concern about whether the evaluation will be an accurate picture.  Follow-up appt with MD: 12/2018 Pain: 0/10  Present, 0/10 Best, 7/10 Worst Aggravating factors: walking, pivoting, bending, bed mobility Easing factors: rest, medications, ice 24 hour pain behavior: AM worse Knee surgery: No Recent knee trauma: No Prior history of knee injury or pain: No Pain quality: pain quality: sharp Radiating symptoms: Yes mostly distal Numbness/Tingling: No Dominant hand: right Imaging: Yes   OBJECTIVE  MUSCULOSKELETAL: Tremor: Absent Bulk: Normal Tone: Normal, no spasticity, rigidity, or clonus No trophic changes noted to lower extremities. No ecchymosis, erythema, or edema noted around knee. No gross knee  deformity noted  Posture Patient has increased kyphosis in upper thoracic with mild S curve apparent from midthoracic through lumbar. Of specific note for B knees, patient stands with significant genu valgum and appears to have large Q angle.   Lumbar/Hip AROM: WFL and painless with overpressure in all planes   Gait Patient ambulating with brace on R knee with decreased R knee flexion and stance time on R. Patient able to increase with VCs, but reverts back to relatively guarded gait.  Palpation No pain to palpation along medial and lateral joint line of knee. No pain over patellar tendon. No pain with palpation to quadriceps or hamstrings.  Strength R/L 4/5 Hip flexion (pain on lateral aspect of knee) 3/5 Hip external rotation 3/3+ Hip internal rotation (pain on lateral aspect of knee) 5/5 Hip extension  5/5 Hip abduction 5/5 Hip adduction 4/5 Knee extension (pain on lateral aspect) 5/5 Knee flexion 5/5 Ankle Dorsiflexion 5/5 Ankle Plantarflexion *indicates pain   AROM  Knee R/L Flexion: 114 degrees painful/110 degrees painful Extension: lacking 7 degrees/ lacking 3 degrees *indicates pain  Hip R/L Flexion: R>L, B WFL Extension: WFL Internal Rotation: WNL External Rotation: limited BLE *indicates pain  Muscle Length Hamstring length: R ~115 degrees, L ~  Passive Accessory Motion Superior Tibiofibular Joint: WNL Knee: WNL Patella: WNL Ankle: WNL  NEUROLOGICAL:  Mental Status Patient is oriented to person, place and time.  Recent memory is intact.  Remote memory is intact.  Attention span and concentration are intact.  Expressive speech is intact.  Patient's fund of knowledge is within normal limits for educational level.  Sensation Grossly intact to light touch bilateral LEs as determined by testing dermatomes L2-S2 Proprioception and hot/cold testing deferred on this date  Reflexes R/L 2+/2+ Ankle Jerk (S1/2)  VASCULAR Dorsalis pedis and  posterior tibial pulses are palpable  SPECIAL TESTS Thessaly Test: (+) R, (-) L  FUNCTIONAL TESTS Sit to stand: Positive for improper body mechanics with significantly increased genu valgum bilaterally, mild increase in pain on R knee   ASSESSMENT Patient is a 68 year old presenting to clinic with chief complaints of R knee pain and decreased function. Upon examination, patient demonstrates deficits in BLE strength, BLE ROM, pain, posture, and body mechanics as evidenced by MMT grossly 3+/5 RLE, sit to stand with increased genu valgum, R knee extension lacking 7 degrees, B knee pain with flexion > 110 degrees. Patient's responses on FOTO (KOOS) outcome measures (40) indicate severe functional limitations/disability/distress. Patient's progress may be limited due to comorbidities; however, patient's motivation is advantageous. Patient was able to achieve appropriate body mechanics for sit to stand without UE support during today's evaluation and responded positively to educational interventions. Patient will benefit from continued skilled therapeutic intervention to address deficits in BLE strength, BLE ROM, pain, posture, and body mechanics in order to decrease/postpone need for surgical intervention, increase function, and improve overall QOL.   Objective measurements completed on examination: See above findings.  TREATMENT  Neuromuscular Re-education: Sit to stand without UE support, with B hip abduction isometric for decreased genu valgum and improved force distribution throughout BLE, x5  Seated hip abduction, GTB, x30. VCs for control and to decrease compensatory patterns from the ankle/foot.  Patient education on safe stair mechanics for decreased pain in R knee, osteopenia/porosis precautions and body mechanics.   Patient educated on prognosis, POC, and provided with HEP including: sit to stand with hip abduction isometric, and seated clamshell. Patient articulated understanding and  returned demonstration. Patient will benefit from further education in order to maximize compliance and understanding for long-term therapeutic gains.      Plan - 11/27/18 0801    Clinical Impression Statement  Patient is a 68 year old presenting to clinic with chief complaints of R knee pain and decreased function. Upon examination, patient demonstrates deficits in BLE strength, BLE ROM, pain, posture, and body mechanics as evidenced by MMT grossly 3+/5 RLE, sit to stand with increased genu valgum, R knee extension lacking 7 degrees, B knee pain with flexion > 110 degrees. Patient's responses on FOTO (KOOS) outcome measures (40) indicate severe functional limitations/disability/distress. Patient's progress may be limited due to comorbidities; however, patient's motivation is advantageous. Patient was able to achieve appropriate body mechanics for sit to stand without UE support during today's evaluation and responded positively to educational interventions. Patient will benefit from continued skilled therapeutic intervention to address deficits in BLE strength, BLE ROM, pain, posture, and body mechanics in order to decrease/postpone need for surgical intervention, increase function, and improve overall QOL.    Personal Factors and Comorbidities  Age;Behavior Pattern;Profession;Past/Current Experience;Time since onset of injury/illness/exacerbation;Fitness;Comorbidity 3+    Comorbidities  HTN, HLD, OA, sleep apnea, vertigo    Examination-Activity Limitations  Squat;Lift;Stairs;Stand;Locomotion Level;Sit;Transfers;Sleep;Bend;Bed Mobility    Examination-Participation Restrictions  Shop;Laundry;Cleaning;Yard Work;Community Activity    Stability/Clinical Decision Making  Evolving/Moderate complexity    Clinical Decision Making  Moderate    Rehab Potential  Fair    PT Frequency  2x / week    PT Duration  6 weeks    PT Treatment/Interventions  ADLs/Self Care Home Management;Cryotherapy;Electrical  Stimulation;Moist Heat;Stair training;Gait training;Therapeutic activities;Functional mobility training;Therapeutic exercise;Balance training;Orthotic Fit/Training;Patient/family education;Neuromuscular re-education;Manual techniques;Dry needling;Passive range of motion;Scar mobilization;Taping;Joint Manipulations    PT Next Visit Plan  BLE strengthening, manual as needed    PT Home Exercise Plan  hip strengthening    Consulted and Agree with Plan of Care  Patient       Patient will benefit from skilled therapeutic intervention in order to improve the following deficits and impairments:  Abnormal gait, Decreased balance, Decreased endurance, Decreased mobility, Difficulty walking, Pain, Postural dysfunction, Decreased strength, Decreased activity tolerance, Decreased range of motion, Improper body mechanics, Impaired flexibility  Visit Diagnosis: Right knee pain, unspecified chronicity  Muscle weakness (generalized)  Difficulty in walking, not elsewhere classified     Problem List Patient Active Problem List   Diagnosis Date Noted  . H/O total vaginal hysterectomy 03/12/2017  . Essential hypertension 03/08/2017  . Mixed dyslipidemia 03/08/2017  . Abnormal EKG 03/08/2017  . Preoperative cardiovascular examination 03/08/2017  . Allergic state 12/11/2013  . High cholesterol 12/11/2013  . History of depression 12/11/2013  . Hypothyroidism, unspecified 12/11/2013  . Sleep apnea 12/11/2013   Myles Gip PT, DPT [phone]  11/27/2018, 9:56 AM  [facility] [address] [place], [place], [address] Phone: [phone]   Fax:  [phone]  Name: Jordan Palmer MRN: [id number] Date  of Birth: [birth year]

## 2018-12-02 ENCOUNTER — Other Ambulatory Visit: Payer: Self-pay

## 2018-12-02 ENCOUNTER — Encounter: Payer: Self-pay | Admitting: Physical Therapy

## 2018-12-02 ENCOUNTER — Ambulatory Visit: Payer: Medicare Other | Attending: Orthopedic Surgery | Admitting: Physical Therapy

## 2018-12-02 DIAGNOSIS — M6281 Muscle weakness (generalized): Secondary | ICD-10-CM | POA: Diagnosis not present

## 2018-12-02 DIAGNOSIS — R262 Difficulty in walking, not elsewhere classified: Secondary | ICD-10-CM | POA: Diagnosis present

## 2018-12-02 DIAGNOSIS — M25561 Pain in right knee: Secondary | ICD-10-CM | POA: Diagnosis present

## 2018-12-02 NOTE — Therapy (Signed)
[facility] [address] [place], [place], [address] Phone: [phone]   Fax:  [phone]  Physical Therapy Treatment  Patient Details  Name: Jordan Palmer MRN: [id number] Date of Birth: 29-Jun-1950 Referring Provider (PT): Mack Guise   Encounter Date: 12/02/2018  PT End of Session - 12/02/18 1426    Visit Number  2    Number of Visits  13    Date for PT Re-Evaluation  01/08/19    Authorization Type  IE 11/27/2018    PT Start Time  1430    PT Stop Time  1515    PT Time Calculation (min)  45 min    Activity Tolerance  Patient tolerated treatment well    Behavior During Therapy  [facility] for tasks assessed/performed       Past Medical History:  Diagnosis Date  . Arthritis   . Hyperlipemia   . Hypertension   . Hypothyroidism   . Sleep apnea   . Vertigo     Past Surgical History:  Procedure Laterality Date  . ANTERIOR AND POSTERIOR REPAIR N/A 03/12/2017   Procedure: ANTERIOR (CYSTOCELE) AND POSTERIOR REPAIR (RECTOCELE);  Surgeon: Salvadore Dom, MD;  Location: [facility];  Service: Gynecology;  Laterality: N/A;  . CARPAL TUNNEL RELEASE Bilateral   . CHOLECYSTECTOMY    . CYSTOSCOPY N/A 03/12/2017   Procedure: CYSTOSCOPY;  Surgeon: Salvadore Dom, MD;  Location: [facility];  Service: Gynecology;  Laterality: N/A;  . EYE SURGERY    . KNEE ARTHROSCOPY Left   . LACRIMAL DUCT PROBING W/ DACRYOPLASTY Bilateral   . SALPINGOOPHORECTOMY Left 03/12/2017   Procedure: SALPINGECTOMY;  Surgeon: Salvadore Dom, MD;  Location: [facility];  Service: Gynecology;  Laterality: Left;  . SHOULDER ARTHROSCOPY WITH OPEN ROTATOR CUFF REPAIR Right 09/03/2014   Procedure: SHOULDER ARTHROSCOPY WITH OPEN ROTATOR CUFF REPAIR;  Surgeon: Thornton Park, MD;  Location: [facility];  Service: Orthopedics;  Laterality: Right;  . THYROIDECTOMY Right    hemi-thyroidectomy  . VAGINAL HYSTERECTOMY N/A  03/12/2017   Procedure: HYSTERECTOMY VAGINAL;  Surgeon: Salvadore Dom, MD;  Location: [facility];  Service: Gynecology;  Laterality: N/A;    There were no vitals filed for this visit.  TREATMENT Manual Therapy: PA tib-fem mobilizations for decreased spasm and improved mobility, grade II/III Proximal and distal tib-fib mobilizations for decreased spasm and improved mobility, grade II/III Patellar mobilizations for decreased spasm and improved mobility, grade III  Therapeutic Exercise: Nustep, L3, seat 6, x5 min for warm-up and to promote improved joint nutrition and endogenous pain relief R sidelying clamshells, RTB, x30 VCs for improved  Glute Bridge with hip abduction isometric, x15 with VCs for body mechanics  Supine hip adduction squeezes, 5 sec x10 for improved VMO activation Controlled articular rotations of R knee for improved joint mobility and proprioception, 20-30% MVC, x3 each direction   Patient educated throughout session on appropriate technique and form using multi-modal cueing, HEP, and activity modification. Patient articulated understanding and returned demonstration.  Patient Response to interventions: Patient has increased tenderness at R knee, but denies any concern.  ASSESSMENT Patient presents to clinic with excellent motivation to participate in therapy. Patient demonstrates deficits in BLE strength, BLE ROM, pain, posture, and body mechanics. Patient able to achieve quality form on all exercises during today's session and responded positively to controlled articular rotations of the R knee. Patient will benefit from continued skilled therapeutic intervention to address remaining deficits  in BLE strength, BLE ROM, pain, posture, and body mechanics in order to decrease/postpone need for surgical intervention, increase function, and improve overall QOL.    PT Long Term Goals - 11/27/18 [id number]      PT LONG TERM GOAL #1   Title  Patient will  demonstrate improved function as evidenced by a score of 59 on FOTO measure for full participation in activities at home and in the community.    Baseline  IE: 40    Time  6    Period  Weeks    Status  New    Target Date  01/08/19      PT LONG TERM GOAL #2   Title  Patient will demonstrate independence with HEP in order to maximize therapeutic gains and improve carryover from physical therapy sessions to ADLs in the home and community.    Baseline  IE: provided    Time  6    Period  Weeks    Status  New    Target Date  01/08/19      PT LONG TERM GOAL #3   Title  Patient will demonstrate improved strength of BLE as evidenced by 1/2 increase in MMT for increased function at home and in the community.    Baseline  IE: RLE 3+, LLE 4    Time  6    Period  Weeks    Status  New    Target Date  01/08/19      PT LONG TERM GOAL #4   Title  Patient will demonstrate improved R knee ROM with decreased pain to R knee flexion > 120degrees and R knee extension 0 degrees for increased function at home and in the community.    Baseline  IE: R knee flexion 114 degrees painful, extension lacking 7 degrees,    Time  6    Period  Weeks    Status  New    Target Date  01/08/19      PT LONG TERM GOAL #5   Title  Patient will report >2 point NPRS improvement in worst pain in a given 7 day period for improved overall QOL.    Baseline  IE: 7/10    Time  6    Period  Weeks    Status  New    Target Date  01/08/19            Plan - 12/02/18 1426    Clinical Impression Statement  Patient presents to clinic with excellent motivation to participate in therapy. Patient demonstrates deficits in BLE strength, BLE ROM, pain, posture, and body mechanics. Patient able to achieve quality form on all exercises during today's session and responded positively to controlled articular rotations of the R knee. Patient will benefit from continued skilled therapeutic intervention to address remaining deficits in BLE  strength, BLE ROM, pain, posture, and body mechanics in order to decrease/postpone need for surgical intervention, increase function, and improve overall QOL.    Personal Factors and Comorbidities  Age;Behavior Pattern;Profession;Past/Current Experience;Time since onset of injury/illness/exacerbation;Fitness;Comorbidity 3+    Comorbidities  HTN, HLD, OA, sleep apnea, vertigo    Examination-Activity Limitations  Squat;Lift;Stairs;Stand;Locomotion Level;Sit;Transfers;Sleep;Bend;Bed Mobility    Examination-Participation Restrictions  Shop;Laundry;Cleaning;Yard Work;Community Activity    Stability/Clinical Decision Making  Evolving/Moderate complexity    Rehab Potential  Fair    PT Frequency  2x / week    PT Duration  6 weeks    PT Treatment/Interventions  ADLs/Self Care Home Management;Cryotherapy;Electrical Stimulation;Moist Heat;Stair training;Gait  training;Therapeutic activities;Functional mobility training;Therapeutic exercise;Balance training;Orthotic Fit/Training;Patient/family education;Neuromuscular re-education;Manual techniques;Dry needling;Passive range of motion;Scar mobilization;Taping;Joint Manipulations    PT Next Visit Plan  BLE strengthening, manual as needed    PT Home Exercise Plan  hip strengthening    Consulted and Agree with Plan of Care  Patient       Patient will benefit from skilled therapeutic intervention in order to improve the following deficits and impairments:  Abnormal gait, Decreased balance, Decreased endurance, Decreased mobility, Difficulty walking, Pain, Postural dysfunction, Decreased strength, Decreased activity tolerance, Decreased range of motion, Improper body mechanics, Impaired flexibility  Visit Diagnosis: Muscle weakness (generalized)  Right knee pain, unspecified chronicity  Difficulty in walking, not elsewhere classified     Problem List Patient Active Problem List   Diagnosis Date Noted  . H/O total vaginal hysterectomy 03/12/2017  .  Essential hypertension 03/08/2017  . Mixed dyslipidemia 03/08/2017  . Abnormal EKG 03/08/2017  . Preoperative cardiovascular examination 03/08/2017  . Allergic state 12/11/2013  . High cholesterol 12/11/2013  . History of depression 12/11/2013  . Hypothyroidism, unspecified 12/11/2013  . Sleep apnea 12/11/2013   Myles Gip PT, DPT [phone] 12/02/2018, 3:45 PM  [facility] [address] [place], [place], [address] Phone: [phone]   Fax:  [phone]  Name: Jordan Palmer MRN: [id number] Date of Birth: 1950/08/24

## 2018-12-12 ENCOUNTER — Ambulatory Visit: Payer: Medicare Other | Admitting: Physical Therapy

## 2018-12-12 ENCOUNTER — Encounter: Payer: Self-pay | Admitting: Physical Therapy

## 2018-12-12 ENCOUNTER — Other Ambulatory Visit: Payer: Self-pay

## 2018-12-12 DIAGNOSIS — R262 Difficulty in walking, not elsewhere classified: Secondary | ICD-10-CM

## 2018-12-12 DIAGNOSIS — M6281 Muscle weakness (generalized): Secondary | ICD-10-CM

## 2018-12-12 DIAGNOSIS — M25561 Pain in right knee: Secondary | ICD-10-CM
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
## 2018-12-17 ENCOUNTER — Encounter: Payer: Self-pay | Admitting: Physical Therapy

## 2018-12-17 ENCOUNTER — Ambulatory Visit: Payer: Medicare Other | Admitting: Physical Therapy

## 2018-12-17 ENCOUNTER — Other Ambulatory Visit: Payer: Self-pay

## 2018-12-17 DIAGNOSIS — M6281 Muscle weakness (generalized): Secondary | ICD-10-CM

## 2018-12-17 DIAGNOSIS — R262 Difficulty in walking, not elsewhere classified: Secondary | ICD-10-CM

## 2018-12-17 DIAGNOSIS — M25561 Pain in right knee: Secondary | ICD-10-CM

## 2018-12-17 NOTE — Therapy (Signed)
[facility] [address] [place], [place], [address] Phone: [phone]   Fax:  [phone]  Physical Therapy Treatment  Patient Details  Name: Jordan Palmer MRN: [id number] Date of Birth: 1950/05/11 Referring Provider (PT): Mack Guise   Encounter Date: 12/17/2018  PT End of Session - 12/17/18 1050    Visit Number  4    Number of Visits  13    Date for PT Re-Evaluation  01/08/19    Authorization Type  IE 11/27/2018    PT Start Time  1045    PT Stop Time  1130    PT Time Calculation (min)  45 min    Activity Tolerance  Patient tolerated treatment well;Patient limited by pain    Behavior During Therapy  [facility] for tasks assessed/performed       Past Medical History:  Diagnosis Date  . Arthritis   . Hyperlipemia   . Hypertension   . Hypothyroidism   . Sleep apnea   . Vertigo     Past Surgical History:  Procedure Laterality Date  . ANTERIOR AND POSTERIOR REPAIR N/A 03/12/2017   Procedure: ANTERIOR (CYSTOCELE) AND POSTERIOR REPAIR (RECTOCELE);  Surgeon: Salvadore Dom, MD;  Location: [facility];  Service: Gynecology;  Laterality: N/A;  . CARPAL TUNNEL RELEASE Bilateral   . CHOLECYSTECTOMY    . CYSTOSCOPY N/A 03/12/2017   Procedure: CYSTOSCOPY;  Surgeon: Salvadore Dom, MD;  Location: [facility];  Service: Gynecology;  Laterality: N/A;  . EYE SURGERY    . KNEE ARTHROSCOPY Left   . LACRIMAL DUCT PROBING W/ DACRYOPLASTY Bilateral   . SALPINGOOPHORECTOMY Left 03/12/2017   Procedure: SALPINGECTOMY;  Surgeon: Salvadore Dom, MD;  Location: [facility];  Service: Gynecology;  Laterality: Left;  . SHOULDER ARTHROSCOPY WITH OPEN ROTATOR CUFF REPAIR Right 09/03/2014   Procedure: SHOULDER ARTHROSCOPY WITH OPEN ROTATOR CUFF REPAIR;  Surgeon: Thornton Park, MD;  Location: [facility];  Service: Orthopedics;  Laterality: Right;  . THYROIDECTOMY Right    hemi-thyroidectomy  .  VAGINAL HYSTERECTOMY N/A 03/12/2017   Procedure: HYSTERECTOMY VAGINAL;  Surgeon: Salvadore Dom, MD;  Location: [facility];  Service: Gynecology;  Laterality: N/A;    There were no vitals filed for this visit.  Subjective Assessment - 12/17/18 1046    Subjective  Patient states that she has been doing her HEP as prescribed and she still has pain. She is unsure if the pain intensity is the same. Patient states that she continues to have interrupted sleep 2/2 to pain. Right now patient has 1/10 pain but prior to taking her ibuprofen she had 5-6/10. Patient states that she spent the morning prior to her appointment deep cleaning her hardwood floors with three passes of various brooms/mops/etc.    Currently in Pain?  Yes    Pain Score  1     Pain Location  Knee    Pain Orientation  Right    Pain Descriptors / Indicators  Aching;Throbbing      TREATMENT  Manual Therapy: R knee PA tib-fem mobilizations for decreased spasm and improved mobility, grade II/III R knee Medial rotational mobilizations for improved mobility and offloading of lateral knee compartment, grade II/III R knee Proximal and distal tib-fib mobilizations for decreased spasm and improved mobility, grade II/III B knee Patellar mobilizations for decreased spasm and improved mobility, grade III  Therapeutic Exercise: Nustep, L2, seat 7, x10 min for warm-up and to promote improved joint nutrition  and endogenous pain relief Seated hamstring stretch, B, x1 min each Supine R knee PROM x20, patient notes 0/10 pain.   Patient educated throughout session on appropriate technique and form using multi-modal cueing, HEP, and activity modification. Patient articulated understanding and returned demonstration.  Patient Response to interventions: Patient reports 0/10 pain  ASSESSMENT Patient presents to clinic with excellent motivation to participate in therapy. Patient demonstrates deficits in BLE strength, BLE ROM,  pain, posture, and body mechanics. Patient able to resolve hamstring tightness with seated stretches during today's session and responded positively to manual and educational interventions. Patient will benefit from continued skilled therapeutic intervention to address remaining deficits in BLE strength, BLE ROM, pain, posture, and body mechanics in order to decrease/postpone need for surgical intervention, increase function, and improve overall QOL.     PT Long Term Goals - 11/27/18 [id number]      PT LONG TERM GOAL #1   Title  Patient will demonstrate improved function as evidenced by a score of 59 on FOTO measure for full participation in activities at home and in the community.    Baseline  IE: 40    Time  6    Period  Weeks    Status  New    Target Date  01/08/19      PT LONG TERM GOAL #2   Title  Patient will demonstrate independence with HEP in order to maximize therapeutic gains and improve carryover from physical therapy sessions to ADLs in the home and community.    Baseline  IE: provided    Time  6    Period  Weeks    Status  New    Target Date  01/08/19      PT LONG TERM GOAL #3   Title  Patient will demonstrate improved strength of BLE as evidenced by 1/2 increase in MMT for increased function at home and in the community.    Baseline  IE: RLE 3+, LLE 4    Time  6    Period  Weeks    Status  New    Target Date  01/08/19      PT LONG TERM GOAL #4   Title  Patient will demonstrate improved R knee ROM with decreased pain to R knee flexion > 120degrees and R knee extension 0 degrees for increased function at home and in the community.    Baseline  IE: R knee flexion 114 degrees painful, extension lacking 7 degrees,    Time  6    Period  Weeks    Status  New    Target Date  01/08/19      PT LONG TERM GOAL #5   Title  Patient will report >2 point NPRS improvement in worst pain in a given 7 day period for improved overall QOL.    Baseline  IE: 7/10    Time  6    Period   Weeks    Status  New    Target Date  01/08/19            Plan - 12/17/18 1050    Clinical Impression Statement  Patient presents to clinic with excellent motivation to participate in therapy. Patient demonstrates deficits in BLE strength, BLE ROM, pain, posture, and body mechanics. Patient able to resolve hamstring tightness with seated stretches during today's session and responded positively to manual and educational interventions. Patient will benefit from continued skilled therapeutic intervention to address remaining deficits in BLE strength, BLE ROM, pain,  posture, and body mechanics in order to decrease/postpone need for surgical intervention, increase function, and improve overall QOL.    Personal Factors and Comorbidities  Age;Behavior Pattern;Profession;Past/Current Experience;Time since onset of injury/illness/exacerbation;Fitness;Comorbidity 3+    Comorbidities  HTN, HLD, OA, sleep apnea, vertigo    Examination-Activity Limitations  Squat;Lift;Stairs;Stand;Locomotion Level;Sit;Transfers;Sleep;Bend;Bed Mobility    Examination-Participation Restrictions  Shop;Laundry;Cleaning;Yard Work;Community Activity    Stability/Clinical Decision Making  Evolving/Moderate complexity    Rehab Potential  Fair    PT Frequency  2x / week    PT Duration  6 weeks    PT Treatment/Interventions  ADLs/Self Care Home Management;Cryotherapy;Electrical Stimulation;Moist Heat;Stair training;Gait training;Therapeutic activities;Functional mobility training;Therapeutic exercise;Balance training;Orthotic Fit/Training;Patient/family education;Neuromuscular re-education;Manual techniques;Dry needling;Passive range of motion;Scar mobilization;Taping;Joint Manipulations    PT Next Visit Plan  BLE strengthening, manual as needed    PT Home Exercise Plan  hip strengthening    Consulted and Agree with Plan of Care  Patient       Patient will benefit from skilled therapeutic intervention in order to improve the  following deficits and impairments:  Abnormal gait, Decreased balance, Decreased endurance, Decreased mobility, Difficulty walking, Pain, Postural dysfunction, Decreased strength, Decreased activity tolerance, Decreased range of motion, Improper body mechanics, Impaired flexibility  Visit Diagnosis: Muscle weakness (generalized)  Right knee pain, unspecified chronicity  Difficulty in walking, not elsewhere classified     Problem List Patient Active Problem List   Diagnosis Date Noted  . H/O total vaginal hysterectomy 03/12/2017  . Essential hypertension 03/08/2017  . Mixed dyslipidemia 03/08/2017  . Abnormal EKG 03/08/2017  . Preoperative cardiovascular examination 03/08/2017  . Allergic state 12/11/2013  . High cholesterol 12/11/2013  . History of depression 12/11/2013  . Hypothyroidism, unspecified 12/11/2013  . Sleep apnea 12/11/2013   Myles Gip PT, DPT [phone] 12/17/2018, 12:47 PM  [place] [facility] [address] [place], [place], [address] Phone: [phone]   Fax:  [phone]  Name: ELPHA RYON MRN: [id number] Date of Birth: Sep 19, 1950

## 2018-12-19 ENCOUNTER — Other Ambulatory Visit: Payer: Self-pay

## 2018-12-19 ENCOUNTER — Encounter: Payer: Self-pay | Admitting: Physical Therapy

## 2018-12-19 ENCOUNTER — Ambulatory Visit: Payer: Medicare Other | Admitting: Physical Therapy

## 2018-12-19 DIAGNOSIS — M6281 Muscle weakness (generalized): Secondary | ICD-10-CM

## 2018-12-19 DIAGNOSIS — M25561 Pain in right knee: Secondary | ICD-10-CM

## 2018-12-19 DIAGNOSIS — R262 Difficulty in walking, not elsewhere classified: Secondary | ICD-10-CM

## 2018-12-19 NOTE — Therapy (Signed)
[facility] [address] [place], [place], [address] Phone: [phone]   Fax:  [phone]  Physical Therapy Treatment  Patient Details  Name: Jordan Palmer MRN: [id number] Date of Birth: 1950-03-25 Referring Provider (PT): Mack Guise   Encounter Date: 12/19/2018  PT End of Session - 12/19/18 1001    Visit Number  5    Number of Visits  13    Date for PT Re-Evaluation  01/08/19    Authorization Type  IE 11/27/2018    PT Start Time  0847    PT Stop Time  0940    PT Time Calculation (min)  53 min    Activity Tolerance  Patient tolerated treatment well    Behavior During Therapy  [facility] for tasks assessed/performed       Past Medical History:  Diagnosis Date  . Arthritis   . Hyperlipemia   . Hypertension   . Hypothyroidism   . Sleep apnea   . Vertigo     Past Surgical History:  Procedure Laterality Date  . ANTERIOR AND POSTERIOR REPAIR N/A 03/12/2017   Procedure: ANTERIOR (CYSTOCELE) AND POSTERIOR REPAIR (RECTOCELE);  Surgeon: Salvadore Dom, MD;  Location: [facility];  Service: Gynecology;  Laterality: N/A;  . CARPAL TUNNEL RELEASE Bilateral   . CHOLECYSTECTOMY    . CYSTOSCOPY N/A 03/12/2017   Procedure: CYSTOSCOPY;  Surgeon: Salvadore Dom, MD;  Location: [facility];  Service: Gynecology;  Laterality: N/A;  . EYE SURGERY    . KNEE ARTHROSCOPY Left   . LACRIMAL DUCT PROBING W/ DACRYOPLASTY Bilateral   . SALPINGOOPHORECTOMY Left 03/12/2017   Procedure: SALPINGECTOMY;  Surgeon: Salvadore Dom, MD;  Location: [facility];  Service: Gynecology;  Laterality: Left;  . SHOULDER ARTHROSCOPY WITH OPEN ROTATOR CUFF REPAIR Right 09/03/2014   Procedure: SHOULDER ARTHROSCOPY WITH OPEN ROTATOR CUFF REPAIR;  Surgeon: Thornton Park, MD;  Location: [facility];  Service: Orthopedics;  Laterality: Right;  . THYROIDECTOMY Right    hemi-thyroidectomy  . VAGINAL HYSTERECTOMY N/A  03/12/2017   Procedure: HYSTERECTOMY VAGINAL;  Surgeon: Salvadore Dom, MD;  Location: [facility];  Service: Gynecology;  Laterality: N/A;    There were no vitals filed for this visit.  Subjective Assessment - 12/19/18 0957    Subjective  Patient states that she tried grading her activity yesterday and did about 30 minutes of yard work (pulling weeds, etc) and then took a break. She then did another 30 minutes later and did not have any adverse reaction. She reports that he rknee pain did not wake her up at night. She continues to take ibuprofen but was able to take 2 instead of 4 this morning and is tolerating her pain well.    Currently in Pain?  Yes    Pain Score  2     Pain Location  Knee    Pain Orientation  Right    Multiple Pain Sites  Yes    Pain Score  4    Pain Location  Knee    Pain Orientation  Left;Medial;Anterior      TREATMENT  Therapeutic Exercise: Nustep, L2, seat 7, x10 min for warm-up and to promote improved joint nutrition and endogenous pain relief Standing hamstring stretch, B, x2 min each Supine R quad sets, 5 sec x10 for improved TKE.  B SLR with quad set, x10 each for improved hip flexion and TKE B sidelying, hip abduction x10 each for decreased  genu valgum B sidelying, hip abduction circles (fwd/bwd) x6 each for improved hip abduction endurance and thus decreased genu valgum Glute bridge with hip abduction isometric for improved hip extension and decreased genu valgum Seated figure 4 stretch for improved hip mobility Patient education on continued graded activity to ensure appropriate and manageable loads placed on knee for optimal recovery.  Post-treatment assessment: R knee extension 0 degrees, R knee flexion symmetrical with L, but painful from 35-45 degrees of flexion  Patient educated throughout session on appropriate technique and form using multi-modal cueing, HEP, and activity modification. Patient articulated understanding and  returned demonstration.  Patient Response to interventions: "It feels looser."  ASSESSMENT Patient presents to clinic with excellent motivation to participate in therapy. Patient demonstrates deficits in BLE strength, BLE ROM, pain, posture, and body mechanics. Patient performed supine/sidelying hip strengthening with good form during today's session and responded positively to active interventions. Patient has improved R knee extension to 0 degrees and nonpainful with R knee flexion WFL but painful between ~35 and 45 degrees. The largest barrier to the patient's progress thus far has been her level of activity and mismanagement of load on the R knee. Patient did indicate during our session today that she is better understanding how to grade her activity to prevent exacerbation of her symptoms. Patient will benefit from continued skilled therapeutic intervention to address remaining deficits in BLE strength, BLE ROM, pain, posture, and body mechanics in order to decrease/postpone need for surgical intervention, increase function, and improve overall QOL.     PT Long Term Goals - 11/27/18 [id number]      PT LONG TERM GOAL #1   Title  Patient will demonstrate improved function as evidenced by a score of 59 on FOTO measure for full participation in activities at home and in the community.    Baseline  IE: 40    Time  6    Period  Weeks    Status  New    Target Date  01/08/19      PT LONG TERM GOAL #2   Title  Patient will demonstrate independence with HEP in order to maximize therapeutic gains and improve carryover from physical therapy sessions to ADLs in the home and community.    Baseline  IE: provided    Time  6    Period  Weeks    Status  New    Target Date  01/08/19      PT LONG TERM GOAL #3   Title  Patient will demonstrate improved strength of BLE as evidenced by 1/2 increase in MMT for increased function at home and in the community.    Baseline  IE: RLE 3+, LLE 4    Time  6     Period  Weeks    Status  New    Target Date  01/08/19      PT LONG TERM GOAL #4   Title  Patient will demonstrate improved R knee ROM with decreased pain to R knee flexion > 120degrees and R knee extension 0 degrees for increased function at home and in the community.    Baseline  IE: R knee flexion 114 degrees painful, extension lacking 7 degrees,    Time  6    Period  Weeks    Status  New    Target Date  01/08/19      PT LONG TERM GOAL #5   Title  Patient will report >2 point NPRS improvement in worst pain in  a given 7 day period for improved overall QOL.    Baseline  IE: 7/10    Time  6    Period  Weeks    Status  New    Target Date  01/08/19            Plan - 12/19/18 1001    Personal Factors and Comorbidities  Age;Behavior Pattern;Profession;Past/Current Experience;Time since onset of injury/illness/exacerbation;Fitness;Comorbidity 3+    Comorbidities  HTN, HLD, OA, sleep apnea, vertigo    Examination-Activity Limitations  Squat;Lift;Stairs;Stand;Locomotion Level;Sit;Transfers;Sleep;Bend;Bed Mobility    Examination-Participation Restrictions  Shop;Laundry;Cleaning;Yard Work;Community Activity    Stability/Clinical Decision Making  Evolving/Moderate complexity    Rehab Potential  Fair    PT Frequency  2x / week    PT Duration  6 weeks    PT Treatment/Interventions  ADLs/Self Care Home Management;Cryotherapy;Electrical Stimulation;Moist Heat;Stair training;Gait training;Therapeutic activities;Functional mobility training;Therapeutic exercise;Balance training;Orthotic Fit/Training;Patient/family education;Neuromuscular re-education;Manual techniques;Dry needling;Passive range of motion;Scar mobilization;Taping;Joint Manipulations    PT Next Visit Plan  BLE strengthening, manual as needed    PT Home Exercise Plan  hip strengthening    Consulted and Agree with Plan of Care  Patient       Patient will benefit from skilled therapeutic intervention in order to improve the  following deficits and impairments:  Abnormal gait, Decreased balance, Decreased endurance, Decreased mobility, Difficulty walking, Pain, Postural dysfunction, Decreased strength, Decreased activity tolerance, Decreased range of motion, Improper body mechanics, Impaired flexibility  Visit Diagnosis: Muscle weakness (generalized)  Right knee pain, unspecified chronicity  Difficulty in walking, not elsewhere classified     Problem List Patient Active Problem List   Diagnosis Date Noted  . H/O total vaginal hysterectomy 03/12/2017  . Essential hypertension 03/08/2017  . Mixed dyslipidemia 03/08/2017  . Abnormal EKG 03/08/2017  . Preoperative cardiovascular examination 03/08/2017  . Allergic state 12/11/2013  . High cholesterol 12/11/2013  . History of depression 12/11/2013  . Hypothyroidism, unspecified 12/11/2013  . Sleep apnea 12/11/2013    Louie Casa 12/19/2018, 10:09 AM  [facility] [address] [place], [place], [address] Phone: [phone]   Fax:  [phone]  Name: Jordan Palmer MRN: [id number] Date of Birth: 1950-03-20

## 2018-12-24 ENCOUNTER — Encounter: Payer: Self-pay | Admitting: Physical Therapy

## 2018-12-24 ENCOUNTER — Ambulatory Visit: Payer: Medicare Other | Admitting: Physical Therapy

## 2018-12-24 ENCOUNTER — Other Ambulatory Visit: Payer: Self-pay

## 2018-12-24 DIAGNOSIS — R262 Difficulty in walking, not elsewhere classified: Secondary | ICD-10-CM

## 2018-12-24 DIAGNOSIS — M6281 Muscle weakness (generalized): Secondary | ICD-10-CM | POA: Diagnosis not present

## 2018-12-24 DIAGNOSIS — M25561 Pain in right knee: Secondary | ICD-10-CM

## 2018-12-24 NOTE — Therapy (Signed)
[facility] [address] [place], [place], [address] Phone: [phone]   Fax:  [phone]  Physical Therapy Treatment  Patient Details  Name: Jordan Palmer MRN: [id number] Date of Birth: 1950-08-29 Referring Provider (PT): Mack Guise   Encounter Date: 12/24/2018  PT End of Session - 12/24/18 0858    Visit Number  6    Number of Visits  13    Date for PT Re-Evaluation  01/08/19    Authorization Type  IE 11/27/2018    PT Start Time  0853    PT Stop Time  0952    PT Time Calculation (min)  59 min    Activity Tolerance  Patient tolerated treatment well    Behavior During Therapy  [facility] for tasks assessed/performed       Past Medical History:  Diagnosis Date  . Arthritis   . Hyperlipemia   . Hypertension   . Hypothyroidism   . Sleep apnea   . Vertigo     Past Surgical History:  Procedure Laterality Date  . ANTERIOR AND POSTERIOR REPAIR N/A 03/12/2017   Procedure: ANTERIOR (CYSTOCELE) AND POSTERIOR REPAIR (RECTOCELE);  Surgeon: Salvadore Dom, MD;  Location: [facility];  Service: Gynecology;  Laterality: N/A;  . CARPAL TUNNEL RELEASE Bilateral   . CHOLECYSTECTOMY    . CYSTOSCOPY N/A 03/12/2017   Procedure: CYSTOSCOPY;  Surgeon: Salvadore Dom, MD;  Location: [facility];  Service: Gynecology;  Laterality: N/A;  . EYE SURGERY    . KNEE ARTHROSCOPY Left   . LACRIMAL DUCT PROBING W/ DACRYOPLASTY Bilateral   . SALPINGOOPHORECTOMY Left 03/12/2017   Procedure: SALPINGECTOMY;  Surgeon: Salvadore Dom, MD;  Location: [facility];  Service: Gynecology;  Laterality: Left;  . SHOULDER ARTHROSCOPY WITH OPEN ROTATOR CUFF REPAIR Right 09/03/2014   Procedure: SHOULDER ARTHROSCOPY WITH OPEN ROTATOR CUFF REPAIR;  Surgeon: Thornton Park, MD;  Location: [facility];  Service: Orthopedics;  Laterality: Right;  . THYROIDECTOMY Right    hemi-thyroidectomy  . VAGINAL HYSTERECTOMY N/A  03/12/2017   Procedure: HYSTERECTOMY VAGINAL;  Surgeon: Salvadore Dom, MD;  Location: [facility];  Service: Gynecology;  Laterality: N/A;    There were no vitals filed for this visit.  Subjective Assessment - 12/24/18 0856    Subjective  Patient notes that she has continued to have good sleep. In fact, she was able to sleep without ibuprofen last night. She met with her MD last Friday and he advised her to continue with PT until her next appointment in January.    Currently in Pain?  Yes    Pain Score  2     Pain Location  Knee    Pain Orientation  Right;Left      TREATMENT  Manual Therapy: R knee PA tib-fem mobilizations for decreased spasm and improved mobility, grade II/III R knee Medial rotational mobilizations for improved mobility and offloading of lateral knee compartment, grade II/III R knee Proximal and distal tib-fib mobilizations for decreased spasm and improved mobility, grade II/III R knee Patellar mobilizations for decreased spasm and improved mobility, grade III  Therapeutic Exercise: Nustep, L2, seat 7, x10 min for warm-up and to promote improved joint nutrition and endogenous pain relief Supine R quad sets, 5 sec x5 for improved TKE.  R SLR with quad set, x10 each for improved hip flexion and TKE Sidelying, R hip abduction with knee flexion isometric x10 for decreased genu valgum Sidelying, R abducted hip  extension isometric 5 sec hold x12 each for improved hip abduction endurance and thus decreased genu valgum Seated hamstring stretch x2 min each side Seated figure-4 stretch x2 min each B heel raises on compliant surface x25 for improved ankle control and GT extension R heel raise with hip abduction isometric x10 on stable surface for improved ankle proprioception and control B gastroc/soleus stretch x2 min each   Patient educated throughout session on appropriate technique and form using multi-modal cueing, HEP, and activity modification.  Patient articulated understanding and returned demonstration.  Patient Response to interventions: "I'm gonna keep trying to do what I'm supposed to."  ASSESSMENT Patient presents to clinic with excellent motivation to participate in therapy. Patient demonstrates deficits in BLE strength, BLE ROM, pain, posture, and body mechanics. Patient continues to perform supine/sidelying hip strengthening with good form during today's session and responded positively to active interventions. Patient will benefit from continued skilled therapeutic intervention to address remaining deficits in BLE strength, BLE ROM, pain, posture, and body mechanics in order to decrease/postpone need for surgical intervention, increase function, and improve overall QOL.   PT Long Term Goals - 11/27/18 1324      PT LONG TERM GOAL #1   Title  Patient will demonstrate improved function as evidenced by a score of 59 on FOTO measure for full participation in activities at home and in the community.    Baseline  IE: 40    Time  6    Period  Weeks    Status  New    Target Date  01/08/19      PT LONG TERM GOAL #2   Title  Patient will demonstrate independence with HEP in order to maximize therapeutic gains and improve carryover from physical therapy sessions to ADLs in the home and community.    Baseline  IE: provided    Time  6    Period  Weeks    Status  New    Target Date  01/08/19      PT LONG TERM GOAL #3   Title  Patient will demonstrate improved strength of BLE as evidenced by 1/2 increase in MMT for increased function at home and in the community.    Baseline  IE: RLE 3+, LLE 4    Time  6    Period  Weeks    Status  New    Target Date  01/08/19      PT LONG TERM GOAL #4   Title  Patient will demonstrate improved R knee ROM with decreased pain to R knee flexion > 120degrees and R knee extension 0 degrees for increased function at home and in the community.    Baseline  IE: R knee flexion 114 degrees painful,  extension lacking 7 degrees,    Time  6    Period  Weeks    Status  New    Target Date  01/08/19      PT LONG TERM GOAL #5   Title  Patient will report >2 point NPRS improvement in worst pain in a given 7 day period for improved overall QOL.    Baseline  IE: 7/10    Time  6    Period  Weeks    Status  New    Target Date  01/08/19            Plan - 12/24/18 0859    Clinical Impression Statement  Patient presents to clinic with excellent motivation to participate in therapy. Patient  demonstrates deficits in BLE strength, BLE ROM, pain, posture, and body mechanics. Patient continues to perform supine/sidelying hip strengthening with good form during today's session and responded positively to active interventions. Patient will benefit from continued skilled therapeutic intervention to address remaining deficits in BLE strength, BLE ROM, pain, posture, and body mechanics in order to decrease/postpone need for surgical intervention, increase function, and improve overall QOL.    Personal Factors and Comorbidities  Age;Behavior Pattern;Profession;Past/Current Experience;Time since onset of injury/illness/exacerbation;Fitness;Comorbidity 3+    Comorbidities  HTN, HLD, OA, sleep apnea, vertigo    Examination-Activity Limitations  Squat;Lift;Stairs;Stand;Locomotion Level;Sit;Transfers;Sleep;Bend;Bed Mobility    Examination-Participation Restrictions  Shop;Laundry;Cleaning;Yard Work;Community Activity    Stability/Clinical Decision Making  Evolving/Moderate complexity    Rehab Potential  Fair    PT Frequency  2x / week    PT Duration  6 weeks    PT Treatment/Interventions  ADLs/Self Care Home Management;Cryotherapy;Electrical Stimulation;Moist Heat;Stair training;Gait training;Therapeutic activities;Functional mobility training;Therapeutic exercise;Balance training;Orthotic Fit/Training;Patient/family education;Neuromuscular re-education;Manual techniques;Dry needling;Passive range of  motion;Scar mobilization;Taping;Joint Manipulations    PT Next Visit Plan  BLE strengthening, manual as needed    PT Home Exercise Plan  hip strengthening    Consulted and Agree with Plan of Care  Patient       Patient will benefit from skilled therapeutic intervention in order to improve the following deficits and impairments:  Abnormal gait, Decreased balance, Decreased endurance, Decreased mobility, Difficulty walking, Pain, Postural dysfunction, Decreased strength, Decreased activity tolerance, Decreased range of motion, Improper body mechanics, Impaired flexibility  Visit Diagnosis: Muscle weakness (generalized)  Right knee pain, unspecified chronicity  Difficulty in walking, not elsewhere classified     Problem List Patient Active Problem List   Diagnosis Date Noted  . H/O total vaginal hysterectomy 03/12/2017  . Essential hypertension 03/08/2017  . Mixed dyslipidemia 03/08/2017  . Abnormal EKG 03/08/2017  . Preoperative cardiovascular examination 03/08/2017  . Allergic state 12/11/2013  . High cholesterol 12/11/2013  . History of depression 12/11/2013  . Hypothyroidism, unspecified 12/11/2013  . Sleep apnea 12/11/2013   Myles Gip PT, DPT [phone] 12/24/2018, 10:42 AM  [facility] [address] [place], [place], [address] Phone: [phone]   Fax:  [phone]  Name: Jordan Palmer MRN: [id number] Date of Birth: [birth year]

## 2018-12-31 ENCOUNTER — Ambulatory Visit: Payer: Medicare Other | Attending: Orthopedic Surgery | Admitting: Physical Therapy

## 2018-12-31 ENCOUNTER — Encounter: Payer: Self-pay | Admitting: Physical Therapy

## 2018-12-31 ENCOUNTER — Other Ambulatory Visit: Payer: Self-pay

## 2018-12-31 DIAGNOSIS — M25561 Pain in right knee: Secondary | ICD-10-CM | POA: Diagnosis present

## 2018-12-31 DIAGNOSIS — M6281 Muscle weakness (generalized): Secondary | ICD-10-CM | POA: Diagnosis present

## 2018-12-31 DIAGNOSIS — R262 Difficulty in walking, not elsewhere classified: Secondary | ICD-10-CM | POA: Diagnosis present

## 2018-12-31 NOTE — Therapy (Signed)
[facility] [address] [place], [place], [address] Phone: [phone]   Fax:  [phone]  Physical Therapy Treatment  Patient Details  Name: Jordan Palmer MRN: [id number] Date of Birth: 02/02/1950 Referring Provider (PT): Mack Guise   Encounter Date: 12/31/2018  PT End of Session - 12/31/18 0806    Visit Number  7    Number of Visits  13    Date for PT Re-Evaluation  01/08/19    Authorization Type  IE 11/27/2018    PT Start Time  0800    PT Stop Time  0855    PT Time Calculation (min)  55 min    Activity Tolerance  Patient tolerated treatment well    Behavior During Therapy  [facility] for tasks assessed/performed       Past Medical History:  Diagnosis Date  . Arthritis   . Hyperlipemia   . Hypertension   . Hypothyroidism   . Sleep apnea   . Vertigo     Past Surgical History:  Procedure Laterality Date  . ANTERIOR AND POSTERIOR REPAIR N/A 03/12/2017   Procedure: ANTERIOR (CYSTOCELE) AND POSTERIOR REPAIR (RECTOCELE);  Surgeon: Salvadore Dom, MD;  Location: [facility];  Service: Gynecology;  Laterality: N/A;  . CARPAL TUNNEL RELEASE Bilateral   . CHOLECYSTECTOMY    . CYSTOSCOPY N/A 03/12/2017   Procedure: CYSTOSCOPY;  Surgeon: Salvadore Dom, MD;  Location: [facility];  Service: Gynecology;  Laterality: N/A;  . EYE SURGERY    . KNEE ARTHROSCOPY Left   . LACRIMAL DUCT PROBING W/ DACRYOPLASTY Bilateral   . SALPINGOOPHORECTOMY Left 03/12/2017   Procedure: SALPINGECTOMY;  Surgeon: Salvadore Dom, MD;  Location: [facility];  Service: Gynecology;  Laterality: Left;  . SHOULDER ARTHROSCOPY WITH OPEN ROTATOR CUFF REPAIR Right 09/03/2014   Procedure: SHOULDER ARTHROSCOPY WITH OPEN ROTATOR CUFF REPAIR;  Surgeon: Thornton Park, MD;  Location: [facility];  Service: Orthopedics;  Laterality: Right;  . THYROIDECTOMY Right    hemi-thyroidectomy  . VAGINAL HYSTERECTOMY N/A  03/12/2017   Procedure: HYSTERECTOMY VAGINAL;  Surgeon: Salvadore Dom, MD;  Location: [facility];  Service: Gynecology;  Laterality: N/A;    There were no vitals filed for this visit.  Subjective Assessment - 12/31/18 0802    Subjective  Patient notes that she had a nice holiday. She reports that she feels she has plateaued with her progress because she woke up at night with pain. She does note that she did not take ibuprofen before bed which may have contributed.    Currently in Pain?  Yes    Pain Score  3     Pain Location  Knee    Pain Orientation  Right;Lateral    Pain Descriptors / Indicators  Sore;Aching       TREATMENT  Manual Therapy: Distal tib-fib mobilizations grade II/III multiple bouts for improved ankle mobility and proprioception Lateral calcaneal mobilizations grade III/IV multiple bouts for improved ankle alignment and joint proprioception  Therapeutic Exercise: Nustep, L2, seat 7, x10 min for warm-up and to promote improved joint nutrition and endogenous pain relief Seated figure-4 stretch x1 min each B gastroc/soleus stretch x1 min each  Neuromuscular Re-education: YTB, R ankle eversion x30 for improved ankle control in standing without supination YTB, neutral ankle and knee position in sitting, x3 min for improved joint proprioception and motor control Standing neutral ankle and knee position, 1 min x5 for improved joint proprioception and LE  static posture Standing neutral ankle with ball block, heel raises 2 x5 with hip ER isometric for improved LE posture and control Patient education on body mechanics and posture for improved knee control during ADLs.   Patient educated throughout session on appropriate technique and form using multi-modal cueing, HEP, and activity modification. Patient articulated understanding and returned demonstration.  Patient Response to interventions: No increased pain.  ASSESSMENT Patient presents to clinic  with excellent motivation to participate in therapy. Patient demonstrates deficits in BLE strength, BLE ROM, pain, posture, and body mechanics. Patient able to achieve neutral ankle and knee alignment during static effort during today's session and responded positively to active interventions. Patient will benefit from continued skilled therapeutic intervention to address remaining deficits in BLE strength, BLE ROM, pain, posture, and body mechanics in order to decrease/postpone need for surgical intervention, increase function, and improve overall QOL.   PT Long Term Goals - 11/27/18 [id number]      PT LONG TERM GOAL #1   Title  Patient will demonstrate improved function as evidenced by a score of 59 on FOTO measure for full participation in activities at home and in the community.    Baseline  IE: 40    Time  6    Period  Weeks    Status  New    Target Date  01/08/19      PT LONG TERM GOAL #2   Title  Patient will demonstrate independence with HEP in order to maximize therapeutic gains and improve carryover from physical therapy sessions to ADLs in the home and community.    Baseline  IE: provided    Time  6    Period  Weeks    Status  New    Target Date  01/08/19      PT LONG TERM GOAL #3   Title  Patient will demonstrate improved strength of BLE as evidenced by 1/2 increase in MMT for increased function at home and in the community.    Baseline  IE: RLE 3+, LLE 4    Time  6    Period  Weeks    Status  New    Target Date  01/08/19      PT LONG TERM GOAL #4   Title  Patient will demonstrate improved R knee ROM with decreased pain to R knee flexion > 120degrees and R knee extension 0 degrees for increased function at home and in the community.    Baseline  IE: R knee flexion 114 degrees painful, extension lacking 7 degrees,    Time  6    Period  Weeks    Status  New    Target Date  01/08/19      PT LONG TERM GOAL #5   Title  Patient will report >2 point NPRS improvement in worst pain  in a given 7 day period for improved overall QOL.    Baseline  IE: 7/10    Time  6    Period  Weeks    Status  New    Target Date  01/08/19            Plan - 12/31/18 0806    Clinical Impression Statement  Patient presents to clinic with excellent motivation to participate in therapy. Patient demonstrates deficits in BLE strength, BLE ROM, pain, posture, and body mechanics. Patient able to achieve neutral ankle and knee alignment during static effort during today's session and responded positively to active interventions. Patient will benefit from  continued skilled therapeutic intervention to address remaining deficits in BLE strength, BLE ROM, pain, posture, and body mechanics in order to decrease/postpone need for surgical intervention, increase function, and improve overall QOL.    Personal Factors and Comorbidities  Age;Behavior Pattern;Profession;Past/Current Experience;Time since onset of injury/illness/exacerbation;Fitness;Comorbidity 3+    Comorbidities  HTN, HLD, OA, sleep apnea, vertigo    Examination-Activity Limitations  Squat;Lift;Stairs;Stand;Locomotion Level;Sit;Transfers;Sleep;Bend;Bed Mobility    Examination-Participation Restrictions  Shop;Laundry;Cleaning;Yard Work;Community Activity    Stability/Clinical Decision Making  Evolving/Moderate complexity    Rehab Potential  Fair    PT Frequency  2x / week    PT Duration  6 weeks    PT Treatment/Interventions  ADLs/Self Care Home Management;Cryotherapy;Electrical Stimulation;Moist Heat;Stair training;Gait training;Therapeutic activities;Functional mobility training;Therapeutic exercise;Balance training;Orthotic Fit/Training;Patient/family education;Neuromuscular re-education;Manual techniques;Dry needling;Passive range of motion;Scar mobilization;Taping;Joint Manipulations    PT Next Visit Plan  BLE strengthening, manual as needed    PT Home Exercise Plan  hip strengthening    Consulted and Agree with Plan of Care  Patient        Patient will benefit from skilled therapeutic intervention in order to improve the following deficits and impairments:  Abnormal gait, Decreased balance, Decreased endurance, Decreased mobility, Difficulty walking, Pain, Postural dysfunction, Decreased strength, Decreased activity tolerance, Decreased range of motion, Improper body mechanics, Impaired flexibility  Visit Diagnosis: Muscle weakness (generalized)  Right knee pain, unspecified chronicity  Difficulty in walking, not elsewhere classified     Problem List Patient Active Problem List   Diagnosis Date Noted  . H/O total vaginal hysterectomy 03/12/2017  . Essential hypertension 03/08/2017  . Mixed dyslipidemia 03/08/2017  . Abnormal EKG 03/08/2017  . Preoperative cardiovascular examination 03/08/2017  . Allergic state 12/11/2013  . High cholesterol 12/11/2013  . History of depression 12/11/2013  . Hypothyroidism, unspecified 12/11/2013  . Sleep apnea 12/11/2013   Myles Gip PT, DPT [phone] 12/31/2018, 9:48 AM  [place] [facility] [address] [place], [place], [address] Phone: [phone]   Fax:  [phone]  Name: CHEREESE BATTENFIELD MRN: [id number] Date of Birth: [birth year]

## 2019-01-02 ENCOUNTER — Encounter: Payer: Self-pay | Admitting: Physical Therapy

## 2019-01-02 ENCOUNTER — Ambulatory Visit: Payer: Medicare Other | Admitting: Physical Therapy

## 2019-01-02 ENCOUNTER — Other Ambulatory Visit: Payer: Self-pay

## 2019-01-02 DIAGNOSIS — M6281 Muscle weakness (generalized): Secondary | ICD-10-CM | POA: Diagnosis not present

## 2019-01-02 DIAGNOSIS — M25561 Pain in right knee: Secondary | ICD-10-CM

## 2019-01-02 DIAGNOSIS — R262 Difficulty in walking, not elsewhere classified: Secondary | ICD-10-CM

## 2019-01-02 NOTE — Therapy (Signed)
[facility] [address] [place], [place], [address] Phone: [phone]   Fax:  [phone]  Physical Therapy Treatment  Patient Details  Name: Jordan Palmer MRN: [id number] Date of Birth: 1950-10-07 Referring Provider (PT): Mack Guise   Encounter Date: 01/02/2019  PT End of Session - 01/02/19 0923    Visit Number  8    Number of Visits  13    Date for PT Re-Evaluation  01/08/19    Authorization Type  IE 11/27/2018    PT Start Time  0800    PT Stop Time  0922    PT Time Calculation (min)  82 min    Activity Tolerance  Patient tolerated treatment well    Behavior During Therapy  [facility] for tasks assessed/performed       Past Medical History:  Diagnosis Date  . Arthritis   . Hyperlipemia   . Hypertension   . Hypothyroidism   . Sleep apnea   . Vertigo     Past Surgical History:  Procedure Laterality Date  . ANTERIOR AND POSTERIOR REPAIR N/A 03/12/2017   Procedure: ANTERIOR (CYSTOCELE) AND POSTERIOR REPAIR (RECTOCELE);  Surgeon: Salvadore Dom, MD;  Location: [facility];  Service: Gynecology;  Laterality: N/A;  . CARPAL TUNNEL RELEASE Bilateral   . CHOLECYSTECTOMY    . CYSTOSCOPY N/A 03/12/2017   Procedure: CYSTOSCOPY;  Surgeon: Salvadore Dom, MD;  Location: [facility];  Service: Gynecology;  Laterality: N/A;  . EYE SURGERY    . KNEE ARTHROSCOPY Left   . LACRIMAL DUCT PROBING W/ DACRYOPLASTY Bilateral   . SALPINGOOPHORECTOMY Left 03/12/2017   Procedure: SALPINGECTOMY;  Surgeon: Salvadore Dom, MD;  Location: [facility];  Service: Gynecology;  Laterality: Left;  . SHOULDER ARTHROSCOPY WITH OPEN ROTATOR CUFF REPAIR Right 09/03/2014   Procedure: SHOULDER ARTHROSCOPY WITH OPEN ROTATOR CUFF REPAIR;  Surgeon: Thornton Park, MD;  Location: [facility];  Service: Orthopedics;  Laterality: Right;  . THYROIDECTOMY Right    hemi-thyroidectomy  . VAGINAL HYSTERECTOMY N/A  03/12/2017   Procedure: HYSTERECTOMY VAGINAL;  Surgeon: Salvadore Dom, MD;  Location: [facility];  Service: Gynecology;  Laterality: N/A;    There were no vitals filed for this visit.  Subjective Assessment - 01/02/19 0809    Subjective  Patient reports no significant changes; she continues to have knee pain with increased activity. She is using ibuprofen and ice to manage pain at the knee. Patient reports that she continues to do her HEP.    Currently in Pain?  Yes    Pain Score  3     Pain Location  Knee    Pain Orientation  Right       TREATMENT  Manual Therapy: Distal tib-fib mobilizations grade II/III multiple bouts for improved ankle mobility and proprioception Lateral calcaneal mobilizations grade III/IV multiple bouts for improved ankle alignment and joint proprioception Navicular mobilizations for grade III/IV multiple bouts for improved eversion and joint proprioception  Therapeutic Exercise: Nustep, L2, seat 7, x10 min for warm-up and to promote improved joint nutrition and endogenous pain relief B hamstring stretch in standing x1 min each B gastroc/soleus stretch x1 min each  Neuromuscular Re-education: OKC R ankle eversion-dorsiflexion x40 for improved ankle joint proprioception and control YTB, R CKC ankle eversion x30 for improved ankle control in standing without supination YTB, R hip end range hip abduction regressive angular isometric in sitting 5 sec hold x20 YTB, neutral ankle and  knee position in sitting, x3 min for improved joint proprioception and motor control Standing neutral ankle and knee position, 1 min x5 for improved joint proprioception and LE static posture Standing neutral ankle YTB hip abduction steps x15 each for improved full foot contact and hip control during gait Patient education on tracking progress with visual aids and graded activity/progressing activity tolerance with respect to pain. Gait activity with increased  lateral hip and lateral lower leg activation for decreased genu valgum and thereby decreased lateral knee pressure/pain.  Patient educated throughout session on appropriate technique and form using multi-modal cueing, HEP, and activity modification. Patient articulated understanding and returned demonstration.  Patient Response to interventions: Patient reports feeling confident with new approach to preparatory exercises prior to ADLs for decreased pain.   ASSESSMENT Patient presents to clinic with excellent motivation to participate in therapy. Patient demonstrates deficits in BLE strength, BLE ROM, pain, posture, and body mechanics. Patient continues to be able to achieve neutral ankle and knee alignment during static efforts during today's session with less frequent cueing and responded positively to active interventions. Patient will benefit from continued skilled therapeutic intervention to address remaining deficits in BLE strength, BLE ROM, pain, posture, and body mechanics in order to decrease/postpone need for surgical intervention, increase function, and improve overall QOL.   PT Long Term Goals - 11/27/18 [id number]      PT LONG TERM GOAL #1   Title  Patient will demonstrate improved function as evidenced by a score of 59 on FOTO measure for full participation in activities at home and in the community.    Baseline  IE: 40    Time  6    Period  Weeks    Status  New    Target Date  01/08/19      PT LONG TERM GOAL #2   Title  Patient will demonstrate independence with HEP in order to maximize therapeutic gains and improve carryover from physical therapy sessions to ADLs in the home and community.    Baseline  IE: provided    Time  6    Period  Weeks    Status  New    Target Date  01/08/19      PT LONG TERM GOAL #3   Title  Patient will demonstrate improved strength of BLE as evidenced by 1/2 increase in MMT for increased function at home and in the community.    Baseline  IE: RLE  3+, LLE 4    Time  6    Period  Weeks    Status  New    Target Date  01/08/19      PT LONG TERM GOAL #4   Title  Patient will demonstrate improved R knee ROM with decreased pain to R knee flexion > 120degrees and R knee extension 0 degrees for increased function at home and in the community.    Baseline  IE: R knee flexion 114 degrees painful, extension lacking 7 degrees,    Time  6    Period  Weeks    Status  New    Target Date  01/08/19      PT LONG TERM GOAL #5   Title  Patient will report >2 point NPRS improvement in worst pain in a given 7 day period for improved overall QOL.    Baseline  IE: 7/10    Time  6    Period  Weeks    Status  New    Target Date  01/08/19            Plan - 01/02/19 0924    Clinical Impression Statement  Patient presents to clinic with excellent motivation to participate in therapy. Patient demonstrates deficits in BLE strength, BLE ROM, pain, posture, and body mechanics. Patient continues to be able to achieve neutral ankle and knee alignment during static efforts during today's session with less frequent cueing and responded positively to active interventions. Patient will benefit from continued skilled therapeutic intervention to address remaining deficits in BLE strength, BLE ROM, pain, posture, and body mechanics in order to decrease/postpone need for surgical intervention, increase function, and improve overall QOL.    Personal Factors and Comorbidities  Age;Behavior Pattern;Profession;Past/Current Experience;Time since onset of injury/illness/exacerbation;Fitness;Comorbidity 3+    Comorbidities  HTN, HLD, OA, sleep apnea, vertigo    Examination-Activity Limitations  Squat;Lift;Stairs;Stand;Locomotion Level;Sit;Transfers;Sleep;Bend;Bed Mobility    Examination-Participation Restrictions  Shop;Laundry;Cleaning;Yard Work;Community Activity    Stability/Clinical Decision Making  Evolving/Moderate complexity    Rehab Potential  Fair    PT Frequency   2x / week    PT Duration  6 weeks    PT Treatment/Interventions  ADLs/Self Care Home Management;Cryotherapy;Electrical Stimulation;Moist Heat;Stair training;Gait training;Therapeutic activities;Functional mobility training;Therapeutic exercise;Balance training;Orthotic Fit/Training;Patient/family education;Neuromuscular re-education;Manual techniques;Dry needling;Passive range of motion;Scar mobilization;Taping;Joint Manipulations    PT Next Visit Plan  BLE strengthening, manual as needed    PT Home Exercise Plan  hip strengthening    Consulted and Agree with Plan of Care  Patient       Patient will benefit from skilled therapeutic intervention in order to improve the following deficits and impairments:  Abnormal gait, Decreased balance, Decreased endurance, Decreased mobility, Difficulty walking, Pain, Postural dysfunction, Decreased strength, Decreased activity tolerance, Decreased range of motion, Improper body mechanics, Impaired flexibility  Visit Diagnosis: Muscle weakness (generalized)  Right knee pain, unspecified chronicity  Difficulty in walking, not elsewhere classified     Problem List Patient Active Problem List   Diagnosis Date Noted  . H/O total vaginal hysterectomy 03/12/2017  . Essential hypertension 03/08/2017  . Mixed dyslipidemia 03/08/2017  . Abnormal EKG 03/08/2017  . Preoperative cardiovascular examination 03/08/2017  . Allergic state 12/11/2013  . High cholesterol 12/11/2013  . History of depression 12/11/2013  . Hypothyroidism, unspecified 12/11/2013  . Sleep apnea 12/11/2013   Myles Gip PT, DPT [phone] 01/02/2019, 9:47 AM  [facility] [address] [place], [place], [address] Phone: [phone]   Fax:  [phone]  Name: Jordan Palmer MRN: [id number] Date of Birth: 06-04-1950

## 2019-01-07 ENCOUNTER — Ambulatory Visit: Payer: Medicare Other | Admitting: Physical Therapy

## 2019-01-07 ENCOUNTER — Encounter: Payer: Self-pay | Admitting: Physical Therapy

## 2019-01-07 ENCOUNTER — Other Ambulatory Visit: Payer: Self-pay

## 2019-01-07 DIAGNOSIS — M6281 Muscle weakness (generalized): Secondary | ICD-10-CM | POA: Diagnosis not present

## 2019-01-07 DIAGNOSIS — R262 Difficulty in walking, not elsewhere classified: Secondary | ICD-10-CM

## 2019-01-07 DIAGNOSIS — M25561 Pain in right knee: Secondary | ICD-10-CM

## 2019-01-07 NOTE — Therapy (Signed)
[facility] [address] [place], [place], [address] Phone: [phone]   Fax:  [phone]  Physical Therapy Treatment  Patient Details  Name: Jordan Palmer MRN: [id number] Date of Birth: 1950-10-19 Referring Provider (PT): Mack Guise   Encounter Date: 01/07/2019  PT End of Session - 01/07/19 0810    Visit Number  9    Number of Visits  13    Date for PT Re-Evaluation  01/08/19    Authorization Type  IE 11/27/2018    PT Start Time  0758    PT Stop Time  0853    PT Time Calculation (min)  55 min    Activity Tolerance  Patient tolerated treatment well    Behavior During Therapy  [facility] for tasks assessed/performed       Past Medical History:  Diagnosis Date  . Arthritis   . Hyperlipemia   . Hypertension   . Hypothyroidism   . Sleep apnea   . Vertigo     Past Surgical History:  Procedure Laterality Date  . ANTERIOR AND POSTERIOR REPAIR N/A 03/12/2017   Procedure: ANTERIOR (CYSTOCELE) AND POSTERIOR REPAIR (RECTOCELE);  Surgeon: Salvadore Dom, MD;  Location: [facility];  Service: Gynecology;  Laterality: N/A;  . CARPAL TUNNEL RELEASE Bilateral   . CHOLECYSTECTOMY    . CYSTOSCOPY N/A 03/12/2017   Procedure: CYSTOSCOPY;  Surgeon: Salvadore Dom, MD;  Location: [facility];  Service: Gynecology;  Laterality: N/A;  . EYE SURGERY    . KNEE ARTHROSCOPY Left   . LACRIMAL DUCT PROBING W/ DACRYOPLASTY Bilateral   . SALPINGOOPHORECTOMY Left 03/12/2017   Procedure: SALPINGECTOMY;  Surgeon: Salvadore Dom, MD;  Location: [facility];  Service: Gynecology;  Laterality: Left;  . SHOULDER ARTHROSCOPY WITH OPEN ROTATOR CUFF REPAIR Right 09/03/2014   Procedure: SHOULDER ARTHROSCOPY WITH OPEN ROTATOR CUFF REPAIR;  Surgeon: Thornton Park, MD;  Location: [facility];  Service: Orthopedics;  Laterality: Right;  . THYROIDECTOMY Right    hemi-thyroidectomy  . VAGINAL HYSTERECTOMY N/A  03/12/2017   Procedure: HYSTERECTOMY VAGINAL;  Surgeon: Salvadore Dom, MD;  Location: [facility];  Service: Gynecology;  Laterality: N/A;    There were no vitals filed for this visit.  Subjective Assessment - 01/07/19 0806    Subjective  Patient notes that she continues to have pain and is reliant on ibuprofen. She reports increased pain and stiffness yesterday with the cold, damp weather. She states that she had a nice weekend at a shooting event with her husband and friends and is excited to have a holiday party with her OR friends this weekend.    Currently in Pain?  Yes    Pain Score  3     Pain Location  Knee    Pain Orientation  Right    Pain Descriptors / Indicators  Aching;Sore       TREATMENT  Manual Therapy: R knee PA tib-fem mobilizations for decreased spasm and improved mobility, grade II/III R knee Medial rotational mobilizations for improved mobility and offloading of lateral knee compartment, grade II/III R knee Proximal and distal tib-fib mobilizations for decreased spasm and improved mobility, grade II/III  Therapeutic Exercise: Nustep, L2, seat 7, x10 min for warm-up and to promote improved joint nutrition and endogenous pain relief Sit to stand from elevated surface x5 Seated LAQ, BLE 2x20  Neuromuscular Re-education: Patient education on load management and sleep hygiene for improved symptom management. Patient continues to  require VCs and education to prevent activity to the point of symptom exacerbation. Controlled articular rotations of B knees for improved joint mobility and proprioception, 30% MVC, B, x6 each direction  Patient educated throughout session on appropriate technique and form using multi-modal cueing, HEP, and activity modification. Patient articulated understanding and returned demonstration.  Patient Response to interventions: Patient reports improved understanding of load management and approach to HEP.    ASSESSMENT Patient presents to clinic with excellent motivation to participate in therapy. Patient demonstrates deficits in BLE strength, BLE ROM, pain, posture, and body mechanics. Patient benefiting from education on load management and factors that contribute to pain experience during today's session and responded positively to active interventions. Patient will benefit from continued skilled therapeutic intervention to address remaining deficits in BLE strength, BLE ROM, pain, posture, and body mechanics in order to decrease/postpone need for surgical intervention, increase function, and improve overall QOL.    PT Long Term Goals - 11/27/18 [id number]      PT LONG TERM GOAL #1   Title  Patient will demonstrate improved function as evidenced by a score of 59 on FOTO measure for full participation in activities at home and in the community.    Baseline  IE: 40    Time  6    Period  Weeks    Status  New    Target Date  01/08/19      PT LONG TERM GOAL #2   Title  Patient will demonstrate independence with HEP in order to maximize therapeutic gains and improve carryover from physical therapy sessions to ADLs in the home and community.    Baseline  IE: provided    Time  6    Period  Weeks    Status  New    Target Date  01/08/19      PT LONG TERM GOAL #3   Title  Patient will demonstrate improved strength of BLE as evidenced by 1/2 increase in MMT for increased function at home and in the community.    Baseline  IE: RLE 3+, LLE 4    Time  6    Period  Weeks    Status  New    Target Date  01/08/19      PT LONG TERM GOAL #4   Title  Patient will demonstrate improved R knee ROM with decreased pain to R knee flexion > 120degrees and R knee extension 0 degrees for increased function at home and in the community.    Baseline  IE: R knee flexion 114 degrees painful, extension lacking 7 degrees,    Time  6    Period  Weeks    Status  New    Target Date  01/08/19      PT LONG TERM GOAL #5    Title  Patient will report >2 point NPRS improvement in worst pain in a given 7 day period for improved overall QOL.    Baseline  IE: 7/10    Time  6    Period  Weeks    Status  New    Target Date  01/08/19            Plan - 01/07/19 0858    Clinical Impression Statement  Patient presents to clinic with excellent motivation to participate in therapy. Patient demonstrates deficits in BLE strength, BLE ROM, pain, posture, and body mechanics. Patient benefiting from education on load management and factors that contribute to pain experience during today's session and  responded positively to active interventions. Patient will benefit from continued skilled therapeutic intervention to address remaining deficits in BLE strength, BLE ROM, pain, posture, and body mechanics in order to decrease/postpone need for surgical intervention, increase function, and improve overall QOL.    Personal Factors and Comorbidities  Age;Behavior Pattern;Profession;Past/Current Experience;Time since onset of injury/illness/exacerbation;Fitness;Comorbidity 3+    Comorbidities  HTN, HLD, OA, sleep apnea, vertigo    Examination-Activity Limitations  Squat;Lift;Stairs;Stand;Locomotion Level;Sit;Transfers;Sleep;Bend;Bed Mobility    Examination-Participation Restrictions  Shop;Laundry;Cleaning;Yard Work;Community Activity    Stability/Clinical Decision Making  Evolving/Moderate complexity    Rehab Potential  Fair    PT Frequency  2x / week    PT Duration  6 weeks    PT Treatment/Interventions  ADLs/Self Care Home Management;Cryotherapy;Electrical Stimulation;Moist Heat;Stair training;Gait training;Therapeutic activities;Functional mobility training;Therapeutic exercise;Balance training;Orthotic Fit/Training;Patient/family education;Neuromuscular re-education;Manual techniques;Dry needling;Passive range of motion;Scar mobilization;Taping;Joint Manipulations    PT Next Visit Plan  BLE strengthening, manual as needed    PT  Home Exercise Plan  hip strengthening    Consulted and Agree with Plan of Care  Patient       Patient will benefit from skilled therapeutic intervention in order to improve the following deficits and impairments:  Abnormal gait, Decreased balance, Decreased endurance, Decreased mobility, Difficulty walking, Pain, Postural dysfunction, Decreased strength, Decreased activity tolerance, Decreased range of motion, Improper body mechanics, Impaired flexibility  Visit Diagnosis: Muscle weakness (generalized)  Right knee pain, unspecified chronicity  Difficulty in walking, not elsewhere classified     Problem List Patient Active Problem List   Diagnosis Date Noted  . H/O total vaginal hysterectomy 03/12/2017  . Essential hypertension 03/08/2017  . Mixed dyslipidemia 03/08/2017  . Abnormal EKG 03/08/2017  . Preoperative cardiovascular examination 03/08/2017  . Allergic state 12/11/2013  . High cholesterol 12/11/2013  . History of depression 12/11/2013  . Hypothyroidism, unspecified 12/11/2013  . Sleep apnea 12/11/2013   Myles Gip PT, DPT [phone] 01/07/2019, 12:21 PM  [facility] [address] [place], [place], [address] Phone: [phone]   Fax:  [phone]  Name: Jordan Palmer MRN: [id number] Date of Birth: 1950/03/28

## 2019-01-08 ENCOUNTER — Other Ambulatory Visit: Payer: Self-pay

## 2019-01-09 ENCOUNTER — Ambulatory Visit: Payer: Medicare Other | Admitting: Physical Therapy

## 2019-01-09 ENCOUNTER — Encounter: Payer: Self-pay | Admitting: Physical Therapy

## 2019-01-09 DIAGNOSIS — M6281 Muscle weakness (generalized): Secondary | ICD-10-CM | POA: Diagnosis not present

## 2019-01-09 DIAGNOSIS — R262 Difficulty in walking, not elsewhere classified: Secondary | ICD-10-CM

## 2019-01-09 DIAGNOSIS — M25561 Pain in right knee: Secondary | ICD-10-CM

## 2019-01-09 NOTE — Therapy (Signed)
[facility] [address]. [place], [place], [address] Phone: [phone]   Fax:  [phone]  Physical Therapy Treatment Physical Therapy Progress Note   Dates of reporting period  11/27/2018   to   01/09/2019   Patient Details  Name: Jordan Palmer MRN: [id number] Date of Birth: 1950-09-10 Referring Provider (PT): Mack Guise   Encounter Date: 01/09/2019  PT End of Session - 01/09/19 0801    Visit Number  10    Number of Visits  13    Date for PT Re-Evaluation  01/08/19    Authorization Type  IE 11/27/2018    PT Start Time  0759    PT Stop Time  0855    PT Time Calculation (min)  56 min    Activity Tolerance  Patient tolerated treatment well    Behavior During Therapy  [facility] for tasks assessed/performed       Past Medical History:  Diagnosis Date  . Arthritis   . Hyperlipemia   . Hypertension   . Hypothyroidism   . Sleep apnea   . Vertigo     Past Surgical History:  Procedure Laterality Date  . ANTERIOR AND POSTERIOR REPAIR N/A 03/12/2017   Procedure: ANTERIOR (CYSTOCELE) AND POSTERIOR REPAIR (RECTOCELE);  Surgeon: Salvadore Dom, MD;  Location: [facility];  Service: Gynecology;  Laterality: N/A;  . CARPAL TUNNEL RELEASE Bilateral   . CHOLECYSTECTOMY    . CYSTOSCOPY N/A 03/12/2017   Procedure: CYSTOSCOPY;  Surgeon: Salvadore Dom, MD;  Location: [facility];  Service: Gynecology;  Laterality: N/A;  . EYE SURGERY    . KNEE ARTHROSCOPY Left   . LACRIMAL DUCT PROBING W/ DACRYOPLASTY Bilateral   . SALPINGOOPHORECTOMY Left 03/12/2017   Procedure: SALPINGECTOMY;  Surgeon: Salvadore Dom, MD;  Location: [facility];  Service: Gynecology;  Laterality: Left;  . SHOULDER ARTHROSCOPY WITH OPEN ROTATOR CUFF REPAIR Right 09/03/2014   Procedure: SHOULDER ARTHROSCOPY WITH OPEN ROTATOR CUFF REPAIR;  Surgeon: Thornton Park, MD;  Location: [facility];  Service: Orthopedics;   Laterality: Right;  . THYROIDECTOMY Right    hemi-thyroidectomy  . VAGINAL HYSTERECTOMY N/A 03/12/2017   Procedure: HYSTERECTOMY VAGINAL;  Surgeon: Salvadore Dom, MD;  Location: [facility];  Service: Gynecology;  Laterality: N/A;    There were no vitals filed for this visit.  Subjective Assessment - 01/09/19 1651    Subjective  Patient reports high level of frustration with continued pain. She notes that she has pain in the morning but when she is up and moving, the pain calms down with the help of ibuprofen. Then in the afternoon the pain will come back on as her ibuprofen wears off, and she notices a change in her attitude because she is so frustrated by not being able to do what she wants to do. She is eager to return to work, but does not see how she would be able to work a full day in the [place] with her R knee in so much pain.    Currently in Pain?  Yes    Pain Location  Knee    Pain Orientation  Right      TREATMENT  Neuromuscular Re-education: Reassessed goals; see below.  Patient education on prognosis, pain coping skills, load management, and graded exposure for improved function in the presence of pain. Patient articulating understanding and will likely need further education to improve understanding of the principles.  Nu-Step, L0, 10  min, seat 6-5, for improved endogenous pain relief and joint ROM  Patient educated throughout session on appropriate technique and form using multi-modal cueing, HEP, and activity modification. Patient articulated understanding and returned demonstration.  Patient Response to interventions: Patient expresses continued desire to avoid surgical intervention.  ASSESSMENT Patient presents to clinic with increased frustration but is amenable to participate in therapy. Patient demonstrates deficits in BLE strength, BLE ROM, pain, posture, and body mechanics. Patient has made significant progress toward goals with respect to  improvement in strength (RLE grossly 4/5) and extension ROM (-5 degrees); she has made progress toward other goals but not at a significant level. Pain and activity tolerance remain her largest barriers to patient significant progress. Patient's condition has the potential to improve in response to therapy. Maximum improvement is yet to be obtained. The anticipated improvement is attainable and reasonable in a generally predictable time. Patient will benefit from continued skilled therapeutic intervention to address remaining deficits in BLE strength, BLE ROM, pain, posture, and body mechanics in order to decrease/postpone need for surgical intervention, increase function, and improve overall QOL.    PT Long Term Goals - 01/09/19 [id number]      PT LONG TERM GOAL #1   Title  Patient will demonstrate improved function as evidenced by a score of 59 on FOTO measure for full participation in activities at home and in the community.    Baseline  IE: 40; 12/10: 42    Time  8    Period  Weeks    Status  On-going    Target Date  03/06/19      PT LONG TERM GOAL #2   Title  Patient will demonstrate independence with HEP in order to maximize therapeutic gains and improve carryover from physical therapy sessions to ADLs in the home and community.    Baseline  IE: provided; 12/10: IND    Status  Achieved      PT LONG TERM GOAL #3   Title  Patient will demonstrate improved strength of BLE as evidenced by 1/2 increase in MMT for increased function at home and in the community.    Baseline  IE: RLE 3+, LLE 4; 12/10: RLE 4, LLE 5    Time  8    Period  Weeks    Status  On-going    Target Date  03/06/19      PT LONG TERM GOAL #4   Title  Patient will demonstrate improved R knee ROM with decreased pain to R knee flexion > 120degrees and R knee extension 0 degrees for increased function at home and in the community.    Baseline  IE: R knee flexion 114 degrees painful, extension lacking 7 degrees; 12/10:    Time   8    Period  Weeks    Status  New      PT LONG TERM GOAL #5   Title  Patient will report >2 point NPRS improvement in worst pain in a given 7 day period for improved overall QOL.    Baseline  IE: 7/10; 12/10: 6/10    Time  8    Period  Weeks    Status  On-going    Target Date  03/06/19            Plan - 01/09/19 0801    Clinical Impression Statement  Patient presents to clinic with increased frustration but is amenable to participate in therapy. Patient demonstrates deficits in BLE strength, BLE ROM, pain, posture,  and body mechanics. Patient has made significant progress toward goals with respect to improvement in strength (RLE grossly 4/5) and extension ROM (-5 degrees); she has made progress toward other goals but not at a significant level. Pain and activity tolerance remain her largest barriers to patient significant progress. Patient's condition has the potential to improve in response to therapy. Maximum improvement is yet to be obtained. The anticipated improvement is attainable and reasonable in a generally predictable time. Patient will benefit from continued skilled therapeutic intervention to address remaining deficits in BLE strength, BLE ROM, pain, posture, and body mechanics in order to decrease/postpone need for surgical intervention, increase function, and improve overall QOL.    Personal Factors and Comorbidities  Age;Behavior Pattern;Profession;Past/Current Experience;Time since onset of injury/illness/exacerbation;Fitness;Comorbidity 3+    Comorbidities  HTN, HLD, OA, sleep apnea, vertigo    Examination-Activity Limitations  Squat;Lift;Stairs;Stand;Locomotion Level;Sit;Transfers;Sleep;Bend;Bed Mobility    Examination-Participation Restrictions  Shop;Laundry;Cleaning;Yard Work;Community Activity    Stability/Clinical Decision Making  Evolving/Moderate complexity    Rehab Potential  Fair    PT Frequency  1x / week    PT Duration  8 weeks    PT Treatment/Interventions   ADLs/Self Care Home Management;Cryotherapy;Electrical Stimulation;Moist Heat;Stair training;Gait training;Therapeutic activities;Functional mobility training;Therapeutic exercise;Balance training;Orthotic Fit/Training;Patient/family education;Neuromuscular re-education;Manual techniques;Dry needling;Passive range of motion;Scar mobilization;Taping;Joint Manipulations    PT Next Visit Plan  BLE strengthening, manual as needed    PT Home Exercise Plan  hip strengthening    Consulted and Agree with Plan of Care  Patient       Patient will benefit from skilled therapeutic intervention in order to improve the following deficits and impairments:  Abnormal gait, Decreased balance, Decreased endurance, Decreased mobility, Difficulty walking, Pain, Postural dysfunction, Decreased strength, Decreased activity tolerance, Decreased range of motion, Improper body mechanics, Impaired flexibility  Visit Diagnosis: Muscle weakness (generalized)  Right knee pain, unspecified chronicity  Difficulty in walking, not elsewhere classified     Problem List Patient Active Problem List   Diagnosis Date Noted  . H/O total vaginal hysterectomy 03/12/2017  . Essential hypertension 03/08/2017  . Mixed dyslipidemia 03/08/2017  . Abnormal EKG 03/08/2017  . Preoperative cardiovascular examination 03/08/2017  . Allergic state 12/11/2013  . High cholesterol 12/11/2013  . History of depression 12/11/2013  . Hypothyroidism, unspecified 12/11/2013  . Sleep apnea 12/11/2013   Myles Gip PT, DPT [phone] 01/09/2019, 6:13 PM  [facility] [address] [place], [place], [address] Phone: [phone]   Fax:  [phone]  Name: AAKRITI GOANS MRN: [id number] Date of Birth: September 21, 1950

## 2019-01-13 ENCOUNTER — Encounter: Payer: Self-pay | Admitting: Obstetrics and Gynecology

## 2019-01-13 ENCOUNTER — Ambulatory Visit (INDEPENDENT_AMBULATORY_CARE_PROVIDER_SITE_OTHER): Payer: Medicare Other | Admitting: Obstetrics and Gynecology

## 2019-01-13 ENCOUNTER — Other Ambulatory Visit: Payer: Self-pay

## 2019-01-13 VITALS — BP 122/64 | HR 83 | Temp 97.5°F | Ht 60.25 in | Wt 166.0 lb

## 2019-01-13 DIAGNOSIS — B372 Candidiasis of skin and nail: Secondary | ICD-10-CM | POA: Diagnosis not present

## 2019-01-13 DIAGNOSIS — N763 Subacute and chronic vulvitis: Secondary | ICD-10-CM | POA: Diagnosis not present

## 2019-01-13 DIAGNOSIS — E2839 Other primary ovarian failure: Secondary | ICD-10-CM | POA: Diagnosis not present

## 2019-01-13 DIAGNOSIS — Z01419 Encounter for gynecological examination (general) (routine) without abnormal findings: Secondary | ICD-10-CM

## 2019-01-13 MED ORDER — BETAMETHASONE VALERATE 0.1 % EX OINT: 1.0000 "application " | TOPICAL_OINTMENT | Freq: Two times a day (BID) | CUTANEOUS | 0 refills | Status: DC

## 2019-01-13 MED ORDER — NYSTATIN 100000 UNIT/GM EX CREA: 1.0000 "application " | TOPICAL_CREAM | Freq: Two times a day (BID) | CUTANEOUS | 0 refills | Status: DC

## 2019-01-13 MED ORDER — PREMARIN 0.625 MG/GM VA CREA: TOPICAL_CREAM | VAGINAL | 1 refills | Status: DC

## 2019-01-13 NOTE — Patient Instructions (Signed)
EXERCISE AND DIET:  We recommended that you start or continue a regular exercise program for good health. Regular exercise means any activity that makes your heart beat faster and makes you sweat.  We recommend exercising at least 30 minutes per day at least 3 days a week, preferably 4 or 5.  We also recommend a diet low in fat and sugar.  Inactivity, poor dietary choices and obesity can cause diabetes, heart attack, stroke, and kidney damage, among others.    ALCOHOL AND SMOKING:  Women should limit their alcohol intake to no more than 7 drinks/beers/glasses of wine (combined, not each!) per week. Moderation of alcohol intake to this level decreases your risk of breast cancer and liver damage. And of course, no recreational drugs are part of a healthy lifestyle.  And absolutely no smoking or even second hand smoke. Most people know smoking can cause heart and lung diseases, but did you know it also contributes to weakening of your bones? Aging of your skin?  Yellowing of your teeth and nails?  CALCIUM AND VITAMIN D:  Adequate intake of calcium and Vitamin D are recommended.  The recommendations for exact amounts of these supplements seem to change often, but generally speaking 1,200 mg of calcium (between diet and supplement) and 800 units of Vitamin D per day seems prudent. Certain women may benefit from higher intake of Vitamin D.  If you are among these women, your doctor will have told you during your visit.    PAP SMEARS:  Pap smears, to check for cervical cancer or precancers,  have traditionally been done yearly, although recent scientific advances have shown that most women can have pap smears less often.  However, every woman still should have a physical exam from her gynecologist every year. It will include a breast check, inspection of the vulva and vagina to check for abnormal growths or skin changes, a visual exam of the cervix, and then an exam to evaluate the size and shape of the uterus and  ovaries.  And after 68 years of age, a rectal exam is indicated to check for rectal cancers. We will also provide age appropriate advice regarding health maintenance, like when you should have certain vaccines, screening for sexually transmitted diseases, bone density testing, colonoscopy, mammograms, etc.   MAMMOGRAMS:  All women over 40 years old should have a yearly mammogram. Many facilities now offer a "3D" mammogram, which may cost around $50 extra out of pocket. If possible,  we recommend you accept the option to have the 3D mammogram performed.  It both reduces the number of women who will be called back for extra views which then turn out to be normal, and it is better than the routine mammogram at detecting truly abnormal areas.    COLON CANCER SCREENING: Now recommend starting at age 45. At this time colonoscopy is not covered for routine screening until 50. There are take home tests that can be done between 45-49.   COLONOSCOPY:  Colonoscopy to screen for colon cancer is recommended for all women at age 50.  We know, you hate the idea of the prep.  We agree, BUT, having colon cancer and not knowing it is worse!!  Colon cancer so often starts as a polyp that can be seen and removed at colonscopy, which can quite literally save your life!  And if your first colonoscopy is normal and you have no family history of colon cancer, most women don't have to have it again for   10 years.  Once every ten years, you can do something that may end up saving your life, right?  We will be happy to help you get it scheduled when you are ready.  Be sure to check your insurance coverage so you understand how much it will cost.  It may be covered as a preventative service at no cost, but you should check your particular policy.      Breast Self-Awareness Breast self-awareness means being familiar with how your breasts look and feel. It involves checking your breasts regularly and reporting any changes to your  health care provider. Practicing breast self-awareness is important. A change in your breasts can be a sign of a serious medical problem. Being familiar with how your breasts look and feel allows you to find any problems early, when treatment is more likely to be successful. All women should practice breast self-awareness, including women who have had breast implants. How to do a breast self-exam One way to learn what is normal for your breasts and whether your breasts are changing is to do a breast self-exam. To do a breast self-exam: Look for Changes  1. Remove all the clothing above your waist. 2. Stand in front of a mirror in a room with good lighting. 3. Put your hands on your hips. 4. Push your hands firmly downward. 5. Compare your breasts in the mirror. Look for differences between them (asymmetry), such as: ? Differences in shape. ? Differences in size. ? Puckers, dips, and bumps in one breast and not the other. 6. Look at each breast for changes in your skin, such as: ? Redness. ? Scaly areas. 7. Look for changes in your nipples, such as: ? Discharge. ? Bleeding. ? Dimpling. ? Redness. ? A change in position. Feel for Changes Carefully feel your breasts for lumps and changes. It is best to do this while lying on your back on the floor and again while sitting or standing in the shower or tub with soapy water on your skin. Feel each breast in the following way:  Place the arm on the side of the breast you are examining above your head.  Feel your breast with the other hand.  Start in the nipple area and make  inch (2 cm) overlapping circles to feel your breast. Use the pads of your three middle fingers to do this. Apply light pressure, then medium pressure, then firm pressure. The light pressure will allow you to feel the tissue closest to the skin. The medium pressure will allow you to feel the tissue that is a little deeper. The firm pressure will allow you to feel the tissue  close to the ribs.  Continue the overlapping circles, moving downward over the breast until you feel your ribs below your breast.  Move one finger-width toward the center of the body. Continue to use the  inch (2 cm) overlapping circles to feel your breast as you move slowly up toward your collarbone.  Continue the up and down exam using all three pressures until you reach your armpit.  Write Down What You Find  Write down what is normal for each breast and any changes that you find. Keep a written record with breast changes or normal findings for each breast. By writing this information down, you do not need to depend only on memory for size, tenderness, or location. Write down where you are in your menstrual cycle, if you are still menstruating. If you are having trouble noticing differences   in your breasts, do not get discouraged. With time you will become more familiar with the variations in your breasts and more comfortable with the exam. How often should I examine my breasts? Examine your breasts every month. If you are breastfeeding, the best time to examine your breasts is after a feeding or after using a breast pump. If you menstruate, the best time to examine your breasts is 5-7 days after your period is over. During your period, your breasts are lumpier, and it may be more difficult to notice changes. When should I see my health care provider? See your health care provider if you notice:  A change in shape or size of your breasts or nipples.  A change in the skin of your breast or nipples, such as a reddened or scaly area.  Unusual discharge from your nipples.  A lump or thick area that was not there before.  Pain in your breasts.  Anything that concerns you.  

## 2019-01-13 NOTE — Progress Notes (Signed)
68 y.o. G25P1001 Married White or Caucasian Not Hispanic or Latino female here for annual exam.  H/O TVH, A&P repair, uterosacral ligament suspension in 2/19. Using vaginal estrogen. No dyspareunia.  Occasional GSI with a full bladder and valsalva, small amount. Tolerable.   She has had a right knee injury, might need surgery.     No LMP recorded. Patient has had a hysterectomy.          Sexually active: Yes.    The current method of family planning is status post hysterectomy.    Exercising: Yes.    physical therapy  Smoker:  no  Health Maintenance: Pap:  03/12/17 normal  History of abnormal Pap:  no MMG:  12/07/17 Density C Bi-rads 1 neg  BMD:   no Colonoscopy: 10/2016 Normal per patient TDaP:  Up to date  Gardasil: na    reports that she has never smoked. She has never used smokeless tobacco. She reports current alcohol use of about 3.0 - 4.0 standard drinks of alcohol per week. She reports that she does not use drugs.  Past Medical History:  Diagnosis Date  . Arthritis   . Hyperlipemia   . Hypertension   . Hypothyroidism   . Sleep apnea   . Vertigo     Past Surgical History:  Procedure Laterality Date  . ANTERIOR AND POSTERIOR REPAIR N/A 03/12/2017   Procedure: ANTERIOR (CYSTOCELE) AND POSTERIOR REPAIR (RECTOCELE);  Surgeon: Salvadore Dom, MD;  Location: [facility];  Service: Gynecology;  Laterality: N/A;  . CARPAL TUNNEL RELEASE Bilateral   . CHOLECYSTECTOMY    . CYSTOSCOPY N/A 03/12/2017   Procedure: CYSTOSCOPY;  Surgeon: Salvadore Dom, MD;  Location: [facility];  Service: Gynecology;  Laterality: N/A;  . EYE SURGERY    . KNEE ARTHROSCOPY Left   . LACRIMAL DUCT PROBING W/ DACRYOPLASTY Bilateral   . SALPINGOOPHORECTOMY Left 03/12/2017   Procedure: SALPINGECTOMY;  Surgeon: Salvadore Dom, MD;  Location: [facility];  Service: Gynecology;  Laterality: Left;  . SHOULDER ARTHROSCOPY WITH OPEN ROTATOR CUFF  REPAIR Right 09/03/2014   Procedure: SHOULDER ARTHROSCOPY WITH OPEN ROTATOR CUFF REPAIR;  Surgeon: Thornton Park, MD;  Location: [facility];  Service: Orthopedics;  Laterality: Right;  . THYROIDECTOMY Right    hemi-thyroidectomy  . VAGINAL HYSTERECTOMY N/A 03/12/2017   Procedure: HYSTERECTOMY VAGINAL;  Surgeon: Salvadore Dom, MD;  Location: [facility];  Service: Gynecology;  Laterality: N/A;    Current Outpatient Medications  Medication Sig Dispense Refill  . Biotin 10000 MCG TBDP Take 1 tablet by mouth daily.     Marland Kitchen conjugated estrogens (PREMARIN) vaginal cream 1/2 gram vaginally twice weekly 30 g 1  . fexofenadine (ALLEGRA) 180 MG tablet Take 180 mg by mouth daily as needed for allergies.     Marland Kitchen ibuprofen (ADVIL,MOTRIN) 800 MG tablet Take 1 tablet (800 mg total) by mouth every 8 (eight) hours as needed. 30 tablet 1  . levothyroxine (SYNTHROID, LEVOTHROID) 100 MCG tablet Take 88 mcg by mouth daily before breakfast.     . lisinopril-hydrochlorothiazide (ZESTORETIC) 20-25 MG tablet TAKE 1 TABLET BY MOUTH ONCE DAILY    . rosuvastatin (CRESTOR) 5 MG tablet Take 5 mg by mouth every morning.     . vitamin B-12 (CYANOCOBALAMIN) 1000 MCG tablet Take 1,000 mcg by mouth daily.     Marland Kitchen lisinopril-hydrochlorothiazide (PRINZIDE,ZESTORETIC) 20-25 MG per tablet Take 1 tablet by mouth daily.     No current facility-administered medications for  this visit.    Family History  Problem Relation Age of Onset  . Diabetes type II Father   . CAD Father   . Alzheimer's disease Father   . Alzheimer's disease Mother   . Breast cancer Neg Hx     Review of Systems  All other systems reviewed and are negative.   Exam:   BP 122/64   Pulse 83   Temp (!) 97.5 F (36.4 C)   Ht 5' 0.25" (1.53 m)   Wt 166 lb (75.3 kg)   SpO2 97%   BMI 32.15 kg/m   Weight change: @WEIGHTCHANGE @ Height:   Height: 5' 0.25" (153 cm)  Ht Readings from Last 3 Encounters:  01/13/19 5' 0.25" (1.53 m)  01/31/18  5\' 1"  (1.549 m)  01/10/18 5\' 1"  (1.549 m)    General appearance: alert, cooperative and appears stated age Head: Normocephalic, without obvious abnormality, atraumatic Neck: no adenopathy, supple, symmetrical, trachea midline and thyroid normal to inspection and palpation Lungs: clear to auscultation bilaterally Cardiovascular: regular rate and rhythm Breasts: normal appearance, no masses or tenderness Abdomen: soft, non-tender; non distended,  no masses,  no organomegaly Extremities: extremities normal, atraumatic, no cyanosis or edema Skin: Skin color, texture, turgor normal. On the lower abdomen is a erythematous, patchy rash.  Lymph nodes: Cervical, supraclavicular, and axillary nodes normal. No abnormal inguinal nodes palpated Neurologic: Grossly normal   Pelvic: External genitalia:  no lesions, some whitening on the inner labia majora              Urethra:  normal appearing urethra with no masses, tenderness or lesions              Bartholins and Skenes: normal                 Vagina: normal appearing vagina with normal color and discharge, no lesions. Vaginal cuff is well supported, no cystocele, minimal rectocele.               Cervix: absent               Bimanual Exam:  Uterus:  uterus absent              Adnexa: no mass, fullness, tenderness               Rectovaginal: Confirms               Anus:  normal sphincter tone, no lesions  Gae Dry chaperoned for the exam.  A:  Well Woman with normal exam  Candida intertrigo  Vulvitis, on questioning during exam, she does c/o irritation  P:   No pap needed  Mammogram due, she will schedule  Discussed breast self exam  Discussed calcium and vit D intake  DEXA ordered  Colonoscopy UTD  Affirm sent  Nystatin for abdomen  Steroid ointment for vulva  Vulvar skin care information given  Continue vaginal estrogen 2 x a week.

## 2019-01-14 ENCOUNTER — Ambulatory Visit: Payer: Medicare Other | Admitting: Physical Therapy

## 2019-01-14 ENCOUNTER — Encounter: Payer: Self-pay | Admitting: Physical Therapy

## 2019-01-14 DIAGNOSIS — R262 Difficulty in walking, not elsewhere classified: Secondary | ICD-10-CM

## 2019-01-14 DIAGNOSIS — M6281 Muscle weakness (generalized): Secondary | ICD-10-CM

## 2019-01-14 DIAGNOSIS — M25561 Pain in right knee: Secondary | ICD-10-CM

## 2019-01-14 LAB — VAGINITIS/VAGINOSIS, DNA PROBE
Candida Species: NEGATIVE
Gardnerella vaginalis: NEGATIVE
Trichomonas vaginosis: NEGATIVE

## 2019-01-14 NOTE — Therapy (Signed)
[facility] [address] [place], [place], [address] Phone: [phone]   Fax:  [phone]  Physical Therapy Treatment  Patient Details  Name: Jordan Palmer MRN: [id number] Date of Birth: 08/27/1950 Referring Provider (PT): Mack Guise   Encounter Date: 01/14/2019  PT End of Session - 01/14/19 0759    Visit Number  11    Number of Visits  21    Date for PT Re-Evaluation  03/06/19    Authorization Type  IE 11/27/2018; PN 01/09/2019    PT Start Time  0800    PT Stop Time  0855    PT Time Calculation (min)  55 min    Activity Tolerance  Patient tolerated treatment well    Behavior During Therapy  [facility] for tasks assessed/performed       Past Medical History:  Diagnosis Date  . Arthritis   . Hyperlipemia   . Hypertension   . Hypothyroidism   . Sleep apnea   . Vertigo     Past Surgical History:  Procedure Laterality Date  . ANTERIOR AND POSTERIOR REPAIR N/A 03/12/2017   Procedure: ANTERIOR (CYSTOCELE) AND POSTERIOR REPAIR (RECTOCELE);  Surgeon: Salvadore Dom, MD;  Location: [facility];  Service: Gynecology;  Laterality: N/A;  . CARPAL TUNNEL RELEASE Bilateral   . CHOLECYSTECTOMY    . CYSTOSCOPY N/A 03/12/2017   Procedure: CYSTOSCOPY;  Surgeon: Salvadore Dom, MD;  Location: [facility];  Service: Gynecology;  Laterality: N/A;  . EYE SURGERY    . KNEE ARTHROSCOPY Left   . LACRIMAL DUCT PROBING W/ DACRYOPLASTY Bilateral   . SALPINGOOPHORECTOMY Left 03/12/2017   Procedure: SALPINGECTOMY;  Surgeon: Salvadore Dom, MD;  Location: [facility];  Service: Gynecology;  Laterality: Left;  . SHOULDER ARTHROSCOPY WITH OPEN ROTATOR CUFF REPAIR Right 09/03/2014   Procedure: SHOULDER ARTHROSCOPY WITH OPEN ROTATOR CUFF REPAIR;  Surgeon: Thornton Park, MD;  Location: [facility];  Service: Orthopedics;  Laterality: Right;  . THYROIDECTOMY Right    hemi-thyroidectomy  . VAGINAL  HYSTERECTOMY N/A 03/12/2017   Procedure: HYSTERECTOMY VAGINAL;  Surgeon: Salvadore Dom, MD;  Location: [facility];  Service: Gynecology;  Laterality: N/A;    There were no vitals filed for this visit.  Subjective Assessment - 01/14/19 0818    Subjective  Patient notes no significant change in her status. She was able to stand and walk about for 90 minutes before needing to rest. However, she reports that if she is doing more intense things (cleaning, yard work), she can only last 60 minutes. Once she sits, she is okay, but she is extremely stiff and painful upon standing after rest.    Currently in Pain?  Yes    Pain Score  2     Pain Location  Knee    Pain Orientation  Right    Pain Descriptors / Indicators  Aching;Sore       TREATMENT  Neuromuscular Re-education:  Nu-Step, L0, 10 min, seat 6-5, for improved endogenous pain relief and joint ROM Foot/ankle strengthening for good force translation during gait and standing postures:  Seated YTB, eversion, BLE, x30   Seated YTB, soleus raises, BLE, x30  Seated YTB, arhc raises, BLE, x30 Standing heel raises, YTB at hips for ER, ball at ankles for alignment, x30 Standing, arch raises, x30 Standing, airex, arch raises, x30  Standing heel raises, airex, ball block at ankle, x30   Patient educated throughout session on appropriate  technique and form using multi-modal cueing, HEP, and activity modification. Patient articulated understanding and returned demonstration.  Patient Response to interventions: Patient denies any increased irritation at the knee.   ASSESSMENT Patient presents to clinic with increased frustration but is amenable to participate in therapy. Patient demonstrates deficits in BLE strength, BLE ROM, pain, posture, and body mechanics. Patient able to perform isolated intrinsic and extrinsic foot strengthening with appropriate quality of motion during today's session and responded well to active  interventions. Patient will benefit from continued skilled therapeutic intervention to address remaining deficits in BLE strength, BLE ROM, pain, posture, and body mechanics in order to decrease/postpone need for surgical intervention, increase function, and improve overall QOL.     PT Long Term Goals - 01/09/19 [id number]      PT LONG TERM GOAL #1   Title  Patient will demonstrate improved function as evidenced by a score of 59 on FOTO measure for full participation in activities at home and in the community.    Baseline  IE: 40; 12/10: 42    Time  8    Period  Weeks    Status  On-going    Target Date  03/06/19      PT LONG TERM GOAL #2   Title  Patient will demonstrate independence with HEP in order to maximize therapeutic gains and improve carryover from physical therapy sessions to ADLs in the home and community.    Baseline  IE: provided; 12/10: IND    Status  Achieved      PT LONG TERM GOAL #3   Title  Patient will demonstrate improved strength of BLE as evidenced by 1/2 increase in MMT for increased function at home and in the community.    Baseline  IE: RLE 3+, LLE 4; 12/10: RLE 4, LLE 5    Time  8    Period  Weeks    Status  On-going    Target Date  03/06/19      PT LONG TERM GOAL #4   Title  Patient will demonstrate improved R knee ROM with decreased pain to R knee flexion > 120degrees and R knee extension 0 degrees for increased function at home and in the community.    Baseline  IE: R knee flexion 114 degrees painful, extension lacking 7 degrees; 12/10:    Time  8    Period  Weeks    Status  New      PT LONG TERM GOAL #5   Title  Patient will report >2 point NPRS improvement in worst pain in a given 7 day period for improved overall QOL.    Baseline  IE: 7/10; 12/10: 6/10    Time  8    Period  Weeks    Status  On-going    Target Date  03/06/19            Plan - 01/14/19 0759    Clinical Impression Statement  Patient presents to clinic with increased  frustration but is amenable to participate in therapy. Patient demonstrates deficits in BLE strength, BLE ROM, pain, posture, and body mechanics. Patient able to perform isolated intrinsic and extrinsic foot strengthening with appropriate quality of motion during today's session and responded well to active interventions. Patient will benefit from continued skilled therapeutic intervention to address remaining deficits in BLE strength, BLE ROM, pain, posture, and body mechanics in order to decrease/postpone need for surgical intervention, increase function, and improve overall QOL.    Personal Factors  and Comorbidities  Age;Behavior Pattern;Profession;Past/Current Experience;Time since onset of injury/illness/exacerbation;Fitness;Comorbidity 3+    Comorbidities  HTN, HLD, OA, sleep apnea, vertigo    Examination-Activity Limitations  Squat;Lift;Stairs;Stand;Locomotion Level;Sit;Transfers;Sleep;Bend;Bed Mobility    Examination-Participation Restrictions  Shop;Laundry;Cleaning;Yard Work;Community Activity    Stability/Clinical Decision Making  Evolving/Moderate complexity    Rehab Potential  Fair    PT Frequency  1x / week    PT Duration  8 weeks    PT Treatment/Interventions  ADLs/Self Care Home Management;Cryotherapy;Electrical Stimulation;Moist Heat;Stair training;Gait training;Therapeutic activities;Functional mobility training;Therapeutic exercise;Balance training;Orthotic Fit/Training;Patient/family education;Neuromuscular re-education;Manual techniques;Dry needling;Passive range of motion;Scar mobilization;Taping;Joint Manipulations    PT Next Visit Plan  BLE strengthening, manual as needed    PT Home Exercise Plan  hip strengthening    Consulted and Agree with Plan of Care  Patient       Patient will benefit from skilled therapeutic intervention in order to improve the following deficits and impairments:  Abnormal gait, Decreased balance, Decreased endurance, Decreased mobility, Difficulty  walking, Pain, Postural dysfunction, Decreased strength, Decreased activity tolerance, Decreased range of motion, Improper body mechanics, Impaired flexibility  Visit Diagnosis: Muscle weakness (generalized)  Right knee pain, unspecified chronicity  Difficulty in walking, not elsewhere classified     Problem List Patient Active Problem List   Diagnosis Date Noted  . H/O total vaginal hysterectomy 03/12/2017  . Essential hypertension 03/08/2017  . Mixed dyslipidemia 03/08/2017  . Abnormal EKG 03/08/2017  . Preoperative cardiovascular examination 03/08/2017  . Allergic state 12/11/2013  . High cholesterol 12/11/2013  . History of depression 12/11/2013  . Hypothyroidism 12/11/2013  . Sleep apnea 12/11/2013   Myles Gip PT, DPT [phone] 01/14/2019, 10:46 AM  [facility] [address] [place], [place], [address] Phone: [phone]   Fax:  [phone]  Name: Jordan Palmer MRN: [id number] Date of Birth: 1950/12/29

## 2019-01-16 ENCOUNTER — Other Ambulatory Visit: Payer: Self-pay | Admitting: Obstetrics and Gynecology

## 2019-01-16 ENCOUNTER — Encounter: Payer: Medicare Other | Admitting: Physical Therapy

## 2019-01-16 DIAGNOSIS — Z1231 Encounter for screening mammogram for malignant neoplasm of breast: Secondary | ICD-10-CM

## 2019-01-21 ENCOUNTER — Ambulatory Visit: Payer: Medicare Other | Admitting: Physical Therapy

## 2019-01-21 ENCOUNTER — Other Ambulatory Visit: Payer: Self-pay

## 2019-01-21 ENCOUNTER — Encounter: Payer: Self-pay | Admitting: Physical Therapy

## 2019-01-21 DIAGNOSIS — R262 Difficulty in walking, not elsewhere classified: Secondary | ICD-10-CM

## 2019-01-21 DIAGNOSIS — M6281 Muscle weakness (generalized): Secondary | ICD-10-CM | POA: Diagnosis not present

## 2019-01-21 DIAGNOSIS — M25561 Pain in right knee: Secondary | ICD-10-CM

## 2019-01-21 NOTE — Therapy (Signed)
[facility] [address] [place], [place], [address] Phone: [phone]   Fax:  [phone]  Physical Therapy Treatment  Patient Details  Name: Jordan Palmer MRN: [id number] Date of Birth: May 21, 1950 Referring Provider (PT): Mack Guise   Encounter Date: 01/21/2019  PT End of Session - 01/21/19 0800    Visit Number  12    Number of Visits  21    Date for PT Re-Evaluation  03/06/19    Authorization Type  IE 11/27/2018; PN 01/09/2019    PT Start Time  0800    PT Stop Time  0855    PT Time Calculation (min)  55 min    Activity Tolerance  Patient tolerated treatment well    Behavior During Therapy  [facility] for tasks assessed/performed       Past Medical History:  Diagnosis Date  . Arthritis   . Hyperlipemia   . Hypertension   . Hypothyroidism   . Sleep apnea   . Vertigo     Past Surgical History:  Procedure Laterality Date  . ANTERIOR AND POSTERIOR REPAIR N/A 03/12/2017   Procedure: ANTERIOR (CYSTOCELE) AND POSTERIOR REPAIR (RECTOCELE);  Surgeon: Salvadore Dom, MD;  Location: [facility];  Service: Gynecology;  Laterality: N/A;  . CARPAL TUNNEL RELEASE Bilateral   . CHOLECYSTECTOMY    . CYSTOSCOPY N/A 03/12/2017   Procedure: CYSTOSCOPY;  Surgeon: Salvadore Dom, MD;  Location: [facility];  Service: Gynecology;  Laterality: N/A;  . EYE SURGERY    . KNEE ARTHROSCOPY Left   . LACRIMAL DUCT PROBING W/ DACRYOPLASTY Bilateral   . SALPINGOOPHORECTOMY Left 03/12/2017   Procedure: SALPINGECTOMY;  Surgeon: Salvadore Dom, MD;  Location: [facility];  Service: Gynecology;  Laterality: Left;  . SHOULDER ARTHROSCOPY WITH OPEN ROTATOR CUFF REPAIR Right 09/03/2014   Procedure: SHOULDER ARTHROSCOPY WITH OPEN ROTATOR CUFF REPAIR;  Surgeon: Thornton Park, MD;  Location: [facility];  Service: Orthopedics;  Laterality: Right;  . THYROIDECTOMY Right    hemi-thyroidectomy  . VAGINAL  HYSTERECTOMY N/A 03/12/2017   Procedure: HYSTERECTOMY VAGINAL;  Surgeon: Salvadore Dom, MD;  Location: [facility];  Service: Gynecology;  Laterality: N/A;    There were no vitals filed for this visit.  Subjective Assessment - 01/21/19 0829    Subjective  Patient notes no significant change to her status. She notes that a couple nights ago after a relatively sedentary day she woke up with R knee pain that was throbbing. She has been sleeping on her side with a pillow between her knees, but nothing seems to help. Patient also notes that the pain is shifting from medial to lateral.    Currently in Pain?  Yes    Pain Score  4     Pain Location  Knee    Pain Orientation  Right      TREATMENT Manual Therapy: R knee Patellar mobilizations for decreased spasm and improved mobility, grade III  Therapeutic Exercise: Nu-Step, L0, 10 min, seat 6-5, for improved endogenous pain relief and joint ROM Seated hamstring stretch x2 min each side Standing heel raises, YTB at hips for ER, x20 Standing hip extension, x20, BLE  Neuromuscular Re-education: Patient education on activity pacing and grading activity for improved tolerance to standing activities in order to increase likelihood of return to work. Foot/ankle strengthening for good force translation during gait and standing postures:  Seated YTB, eversion, BLE, 2x30   Seated YTB, soleus raises,  BLE, 2x30  Patient educated throughout session on appropriate technique and form using multi-modal cueing, HEP, and activity modification. Patient articulated understanding and returned demonstration.  Patient Response to interventions: Patient denies any increased irritation at the knee. 1/10 pain at end of session  ASSESSMENT Patient presents to clinic with increased frustration but is amenable to participate in therapy. Patient demonstrates deficits in BLE strength, BLE ROM, pain, posture, and body mechanics. Patient continues to  be able to perform isolated intrinsic and extrinsic foot strengthening with appropriate quality of motion during today's session and responded well to active interventions. Patient will benefit from continued skilled therapeutic intervention to address remaining deficits in BLE strength, BLE ROM, pain, posture, and body mechanics in order to decrease/postpone need for surgical intervention, increase function, and improve overall QOL.    PT Long Term Goals - 01/09/19 [id number]      PT LONG TERM GOAL #1   Title  Patient will demonstrate improved function as evidenced by a score of 59 on FOTO measure for full participation in activities at home and in the community.    Baseline  IE: 40; 12/10: 42    Time  8    Period  Weeks    Status  On-going    Target Date  03/06/19      PT LONG TERM GOAL #2   Title  Patient will demonstrate independence with HEP in order to maximize therapeutic gains and improve carryover from physical therapy sessions to ADLs in the home and community.    Baseline  IE: provided; 12/10: IND    Status  Achieved      PT LONG TERM GOAL #3   Title  Patient will demonstrate improved strength of BLE as evidenced by 1/2 increase in MMT for increased function at home and in the community.    Baseline  IE: RLE 3+, LLE 4; 12/10: RLE 4, LLE 5    Time  8    Period  Weeks    Status  On-going    Target Date  03/06/19      PT LONG TERM GOAL #4   Title  Patient will demonstrate improved R knee ROM with decreased pain to R knee flexion > 120degrees and R knee extension 0 degrees for increased function at home and in the community.    Baseline  IE: R knee flexion 114 degrees painful, extension lacking 7 degrees; 12/10:    Time  8    Period  Weeks    Status  New      PT LONG TERM GOAL #5   Title  Patient will report >2 point NPRS improvement in worst pain in a given 7 day period for improved overall QOL.    Baseline  IE: 7/10; 12/10: 6/10    Time  8    Period  Weeks    Status   On-going    Target Date  03/06/19            Plan - 01/21/19 0831    Clinical Impression Statement  Patient presents to clinic with increased frustration but is amenable to participate in therapy. Patient demonstrates deficits in BLE strength, BLE ROM, pain, posture, and body mechanics. Patient continues to be able to perform isolated intrinsic and extrinsic foot strengthening with appropriate quality of motion during today's session and responded well to active interventions. Patient will benefit from continued skilled therapeutic intervention to address remaining deficits in BLE strength, BLE ROM, pain, posture, and body mechanics  in order to decrease/postpone need for surgical intervention, increase function, and improve overall QOL.    Personal Factors and Comorbidities  Age;Behavior Pattern;Profession;Past/Current Experience;Time since onset of injury/illness/exacerbation;Fitness;Comorbidity 3+    Comorbidities  HTN, HLD, OA, sleep apnea, vertigo    Examination-Activity Limitations  Squat;Lift;Stairs;Stand;Locomotion Level;Sit;Transfers;Sleep;Bend;Bed Mobility    Examination-Participation Restrictions  Shop;Laundry;Cleaning;Yard Work;Community Activity    Stability/Clinical Decision Making  Evolving/Moderate complexity    Rehab Potential  Fair    PT Frequency  1x / week    PT Duration  8 weeks    PT Treatment/Interventions  ADLs/Self Care Home Management;Cryotherapy;Electrical Stimulation;Moist Heat;Stair training;Gait training;Therapeutic activities;Functional mobility training;Therapeutic exercise;Balance training;Orthotic Fit/Training;Patient/family education;Neuromuscular re-education;Manual techniques;Dry needling;Passive range of motion;Scar mobilization;Taping;Joint Manipulations    PT Next Visit Plan  BLE strengthening, manual as needed    PT Home Exercise Plan  hip strengthening    Consulted and Agree with Plan of Care  Patient       Patient will benefit from skilled  therapeutic intervention in order to improve the following deficits and impairments:  Abnormal gait, Decreased balance, Decreased endurance, Decreased mobility, Difficulty walking, Pain, Postural dysfunction, Decreased strength, Decreased activity tolerance, Decreased range of motion, Improper body mechanics, Impaired flexibility  Visit Diagnosis: Muscle weakness (generalized)  Right knee pain, unspecified chronicity  Difficulty in walking, not elsewhere classified     Problem List Patient Active Problem List   Diagnosis Date Noted  . H/O total vaginal hysterectomy 03/12/2017  . Essential hypertension 03/08/2017  . Mixed dyslipidemia 03/08/2017  . Abnormal EKG 03/08/2017  . Preoperative cardiovascular examination 03/08/2017  . Allergic state 12/11/2013  . High cholesterol 12/11/2013  . History of depression 12/11/2013  . Hypothyroidism 12/11/2013  . Sleep apnea 12/11/2013   Myles Gip PT, DPT [phone] 01/21/2019, 12:53 PM  [facility] [address] [place], [place], [address] Phone: [phone]   Fax:  [phone]  Name: Jordan Palmer MRN: [id number] Date of Birth: 1950/09/06

## 2019-01-22 ENCOUNTER — Ambulatory Visit: Payer: Medicare Other

## 2019-01-22 ENCOUNTER — Other Ambulatory Visit: Payer: Medicare Other

## 2019-01-23 ENCOUNTER — Encounter: Payer: Medicare Other | Admitting: Physical Therapy

## 2019-01-28 ENCOUNTER — Other Ambulatory Visit: Payer: Self-pay

## 2019-01-28 ENCOUNTER — Ambulatory Visit
Admission: RE | Admit: 2019-01-28 | Discharge: 2019-01-28 | Disposition: A | Discharge status: Home / Self Care | Payer: Medicare Other | Source: Ambulatory Visit | Attending: Obstetrics and Gynecology | Admitting: Obstetrics and Gynecology

## 2019-01-28 DIAGNOSIS — E2839 Other primary ovarian failure: Secondary | ICD-10-CM | POA: Diagnosis present

## 2019-01-28 DIAGNOSIS — Z1231 Encounter for screening mammogram for malignant neoplasm of breast: Secondary | ICD-10-CM | POA: Insufficient documentation

## 2019-01-30 ENCOUNTER — Encounter: Payer: Self-pay | Admitting: Physical Therapy

## 2019-01-30 ENCOUNTER — Other Ambulatory Visit: Payer: Self-pay

## 2019-01-30 ENCOUNTER — Ambulatory Visit: Payer: Medicare Other | Admitting: Physical Therapy

## 2019-01-30 DIAGNOSIS — M25561 Pain in right knee: Secondary | ICD-10-CM

## 2019-01-30 DIAGNOSIS — M6281 Muscle weakness (generalized): Secondary | ICD-10-CM

## 2019-01-30 DIAGNOSIS — R262 Difficulty in walking, not elsewhere classified: Secondary | ICD-10-CM

## 2019-01-30 NOTE — Therapy (Signed)
[facility] [address] [place], [place], [address] Phone: [phone]   Fax:  [phone]  Physical Therapy Treatment  Patient Details  Name: Jordan Palmer MRN: [id number] Date of Birth: Oct 24, 1950 Referring Provider (PT): Mack Guise   Encounter Date: 01/30/2019  PT End of Session - 01/30/19 0801    Visit Number  13    Number of Visits  21    Date for PT Re-Evaluation  03/06/19    Authorization Type  IE 11/27/2018; PN 01/09/2019    PT Start Time  0756    PT Stop Time  0850    PT Time Calculation (min)  54 min    Activity Tolerance  Patient tolerated treatment well    Behavior During Therapy  [facility] for tasks assessed/performed       Past Medical History:  Diagnosis Date  . Arthritis   . Hyperlipemia   . Hypertension   . Hypothyroidism   . Sleep apnea   . Vertigo     Past Surgical History:  Procedure Laterality Date  . ANTERIOR AND POSTERIOR REPAIR N/A 03/12/2017   Procedure: ANTERIOR (CYSTOCELE) AND POSTERIOR REPAIR (RECTOCELE);  Surgeon: Salvadore Dom, MD;  Location: [facility];  Service: Gynecology;  Laterality: N/A;  . CARPAL TUNNEL RELEASE Bilateral   . CHOLECYSTECTOMY    . CYSTOSCOPY N/A 03/12/2017   Procedure: CYSTOSCOPY;  Surgeon: Salvadore Dom, MD;  Location: [facility];  Service: Gynecology;  Laterality: N/A;  . EYE SURGERY    . KNEE ARTHROSCOPY Left   . LACRIMAL DUCT PROBING W/ DACRYOPLASTY Bilateral   . SALPINGOOPHORECTOMY Left 03/12/2017   Procedure: SALPINGECTOMY;  Surgeon: Salvadore Dom, MD;  Location: [facility];  Service: Gynecology;  Laterality: Left;  . SHOULDER ARTHROSCOPY WITH OPEN ROTATOR CUFF REPAIR Right 09/03/2014   Procedure: SHOULDER ARTHROSCOPY WITH OPEN ROTATOR CUFF REPAIR;  Surgeon: Thornton Park, MD;  Location: [facility];  Service: Orthopedics;  Laterality: Right;  . THYROIDECTOMY Right    hemi-thyroidectomy  . VAGINAL  HYSTERECTOMY N/A 03/12/2017   Procedure: HYSTERECTOMY VAGINAL;  Surgeon: Salvadore Dom, MD;  Location: [facility];  Service: Gynecology;  Laterality: N/A;    There were no vitals filed for this visit.  Subjective Assessment - 01/30/19 0759    Subjective  Patient notes no significant change in her status. Patient notes that she was able to move her appt up with the MD to 02/10/2019.    Currently in Pain?  Yes    Pain Score  3     Pain Location  Knee    Pain Orientation  Right       TREATMENT  Therapeutic Exercise: Nu-Step, L2-1, 10 min, seat 6-5, for improved endogenous pain relief and joint ROM Standing heel raises, YTB at hips for ER, 2x20  Neuromuscular Re-education: Foot/ankle strengthening for good force translation during gait and standing postures:  Seated YTB, eversion, BLE, 3x25   Seated YTB, soleus raises, BLE, 3x25 Airex SLS balance for improved LE positional awareness and control:  EO, finger tip support, 3x 30 sec Reassessed goals; see below.  Patient educated throughout session on appropriate technique and form using multi-modal cueing, HEP, and activity modification. Patient articulated understanding and returned demonstration.  Patient Response to interventions: Patient denies any increased irritation at the knee.  ASSESSMENT Patient presents to clinic with increased frustration but is amenable to participate in therapy. Patient demonstrates deficits in BLE strength, BLE ROM,  pain, posture, and body mechanics. Patient continues to be able to perform isolated intrinsic and extrinsic foot strengthening with appropriate quality of motion during today's session and responded well to active interventions. Patient also demonstrated improved function on FOTO outcome measure. Patient will benefit from continued skilled therapeutic intervention to address remaining deficits in BLE strength, BLE ROM, pain, posture, and body mechanics in order to  decrease/postpone need for surgical intervention, increase function, and improve overall QOL.    PT Long Term Goals - 01/30/19 0818      PT LONG TERM GOAL #1   Title  Patient will demonstrate improved function as evidenced by a score of 59 on FOTO measure for full participation in activities at home and in the community.    Baseline  IE: 40; 12/10: 42; 12/31: 44    Time  8    Period  Weeks    Status  On-going    Target Date  03/06/19      PT LONG TERM GOAL #2   Title  Patient will demonstrate independence with HEP in order to maximize therapeutic gains and improve carryover from physical therapy sessions to ADLs in the home and community.    Baseline  IE: provided; 12/10: IND    Status  Achieved      PT LONG TERM GOAL #3   Title  Patient will demonstrate improved strength of BLE as evidenced by 1/2 increase in MMT for increased function at home and in the community.    Baseline  IE: RLE 3+, LLE 4; 12/10: RLE 4, LLE 5    Time  8    Period  Weeks    Status  On-going    Target Date  03/06/19      PT LONG TERM GOAL #4   Title  Patient will demonstrate improved R knee ROM with decreased pain to R knee flexion > 120degrees and R knee extension 0 degrees for increased function at home and in the community.    Baseline  IE: R knee flexion 114 degrees painful, extension lacking 7 degrees; 12/31: R knee ext: 0 degrees, R knee flexion 118 degrees (painful from 95-105)    Time  8    Period  Weeks    Status  New    Target Date  03/06/19      PT LONG TERM GOAL #5   Title  Patient will report >2 point NPRS improvement in worst pain in a given 7 day period for improved overall QOL.    Baseline  IE: 7/10; 12/10: 6/10; 12/31: 4-5/10    Time  8    Period  Weeks    Status  On-going    Target Date  03/06/19            Plan - 01/30/19 0801    Clinical Impression Statement  Patient presents to clinic with increased frustration but is amenable to participate in therapy. Patient  demonstrates deficits in BLE strength, BLE ROM, pain, posture, and body mechanics. Patient continues to be able to perform isolated intrinsic and extrinsic foot strengthening with appropriate quality of motion during today's session and responded well to active interventions. Patient also demonstrated improved function on FOTO outcome measure. Patient will benefit from continued skilled therapeutic intervention to address remaining deficits in BLE strength, BLE ROM, pain, posture, and body mechanics in order to decrease/postpone need for surgical intervention, increase function, and improve overall QOL.    Personal Factors and Comorbidities  Age;Behavior Pattern;Profession;Past/Current Experience;Time since  onset of injury/illness/exacerbation;Fitness;Comorbidity 3+    Comorbidities  HTN, HLD, OA, sleep apnea, vertigo    Examination-Activity Limitations  Squat;Lift;Stairs;Stand;Locomotion Level;Sit;Transfers;Sleep;Bend;Bed Mobility    Examination-Participation Restrictions  Shop;Laundry;Cleaning;Yard Work;Community Activity    Stability/Clinical Decision Making  Evolving/Moderate complexity    Rehab Potential  Fair    PT Frequency  1x / week    PT Duration  8 weeks    PT Treatment/Interventions  ADLs/Self Care Home Management;Cryotherapy;Electrical Stimulation;Moist Heat;Stair training;Gait training;Therapeutic activities;Functional mobility training;Therapeutic exercise;Balance training;Orthotic Fit/Training;Patient/family education;Neuromuscular re-education;Manual techniques;Dry needling;Passive range of motion;Scar mobilization;Taping;Joint Manipulations    PT Next Visit Plan  BLE strengthening, manual as needed    PT Home Exercise Plan  hip strengthening    Consulted and Agree with Plan of Care  Patient       Patient will benefit from skilled therapeutic intervention in order to improve the following deficits and impairments:  Abnormal gait, Decreased balance, Decreased endurance, Decreased  mobility, Difficulty walking, Pain, Postural dysfunction, Decreased strength, Decreased activity tolerance, Decreased range of motion, Improper body mechanics, Impaired flexibility  Visit Diagnosis: Muscle weakness (generalized)  Right knee pain, unspecified chronicity  Difficulty in walking, not elsewhere classified     Problem List Patient Active Problem List   Diagnosis Date Noted  . H/O total vaginal hysterectomy 03/12/2017  . Essential hypertension 03/08/2017  . Mixed dyslipidemia 03/08/2017  . Abnormal EKG 03/08/2017  . Preoperative cardiovascular examination 03/08/2017  . Allergic state 12/11/2013  . High cholesterol 12/11/2013  . History of depression 12/11/2013  . Hypothyroidism 12/11/2013  . Sleep apnea 12/11/2013   Myles Gip PT, DPT [phone] 01/30/2019, 12:11 PM  [facility] [address] [place], [place], [address] Phone: [phone]   Fax:  [phone]  Name: Jordan Palmer MRN: [id number] Date of Birth: 10-17-1950

## 2019-02-04 ENCOUNTER — Encounter: Payer: Medicare Other | Admitting: Physical Therapy

## 2019-02-11 ENCOUNTER — Other Ambulatory Visit: Payer: Self-pay

## 2019-02-11 ENCOUNTER — Encounter: Payer: Self-pay | Admitting: Physical Therapy

## 2019-02-11 ENCOUNTER — Other Ambulatory Visit
Admission: RE | Admit: 2019-02-11 | Discharge: 2019-02-11 | Disposition: A | Discharge status: Home / Self Care | Payer: Medicare Other | Source: Ambulatory Visit | Attending: Orthopedic Surgery | Admitting: Orthopedic Surgery

## 2019-02-11 ENCOUNTER — Ambulatory Visit: Payer: Medicare Other | Attending: Orthopedic Surgery | Admitting: Physical Therapy

## 2019-02-11 DIAGNOSIS — R262 Difficulty in walking, not elsewhere classified: Secondary | ICD-10-CM | POA: Insufficient documentation

## 2019-02-11 DIAGNOSIS — Z20822 Contact with and (suspected) exposure to covid-19: Secondary | ICD-10-CM | POA: Diagnosis not present

## 2019-02-11 DIAGNOSIS — M6281 Muscle weakness (generalized): Secondary | ICD-10-CM | POA: Insufficient documentation

## 2019-02-11 DIAGNOSIS — Z01812 Encounter for preprocedural laboratory examination: Secondary | ICD-10-CM | POA: Insufficient documentation

## 2019-02-11 DIAGNOSIS — M25561 Pain in right knee: Secondary | ICD-10-CM | POA: Insufficient documentation

## 2019-02-11 LAB — SARS CORONAVIRUS 2 (TAT 6-24 HRS): SARS Coronavirus 2: NEGATIVE

## 2019-02-11 NOTE — Therapy (Signed)
[facility] [address] [place], [place], [address] Phone: [phone]   Fax:  [phone]  Physical Therapy Treatment  Patient Details  Name: Jordan Palmer MRN: [id number] Date of Birth: 09-24-1950 Referring Provider (PT): Mack Guise   Encounter Date: 02/11/2019  PT End of Session - 02/11/19 0820    Visit Number  13    Number of Visits  21    Date for PT Re-Evaluation  03/06/19    Authorization Type  IE 11/27/2018; PN 01/09/2019       Past Medical History:  Diagnosis Date  . Arthritis   . Hyperlipemia   . Hypertension   . Hypothyroidism   . Sleep apnea   . Vertigo     Past Surgical History:  Procedure Laterality Date  . ANTERIOR AND POSTERIOR REPAIR N/A 03/12/2017   Procedure: ANTERIOR (CYSTOCELE) AND POSTERIOR REPAIR (RECTOCELE);  Surgeon: Salvadore Dom, MD;  Location: [facility];  Service: Gynecology;  Laterality: N/A;  . CARPAL TUNNEL RELEASE Bilateral   . CHOLECYSTECTOMY    . CYSTOSCOPY N/A 03/12/2017   Procedure: CYSTOSCOPY;  Surgeon: Salvadore Dom, MD;  Location: [facility];  Service: Gynecology;  Laterality: N/A;  . EYE SURGERY    . KNEE ARTHROSCOPY Left   . LACRIMAL DUCT PROBING W/ DACRYOPLASTY Bilateral   . SALPINGOOPHORECTOMY Left 03/12/2017   Procedure: SALPINGECTOMY;  Surgeon: Salvadore Dom, MD;  Location: [facility];  Service: Gynecology;  Laterality: Left;  . SHOULDER ARTHROSCOPY WITH OPEN ROTATOR CUFF REPAIR Right 09/03/2014   Procedure: SHOULDER ARTHROSCOPY WITH OPEN ROTATOR CUFF REPAIR;  Surgeon: Thornton Park, MD;  Location: [facility];  Service: Orthopedics;  Laterality: Right;  . THYROIDECTOMY Right    hemi-thyroidectomy  . VAGINAL HYSTERECTOMY N/A 03/12/2017   Procedure: HYSTERECTOMY VAGINAL;  Surgeon: Salvadore Dom, MD;  Location: [facility];  Service: Gynecology;  Laterality: N/A;    There were no vitals  filed for this visit.  Subjective Assessment - 02/11/19 0816    Subjective  Patient presents to clinic with increased pain and news of an arthroscopic knee surgery scheduled for Thursday, 02/13/2019. She notes that she has continued to do her exercises and is currently not taking her ibuprofen 2/2 to surgical prep and is increased pain this morning. PT and patient discussed safe exercises for after surgery and discussed the benefits of keeping pain in relative control prior to surgery. As a result, PT and patient agreed that today's session would not offer benefits outweighing potential costs. Patient is independent with her HEP and will reach back out to clinic for PT s/p surgical intervention if neccessary.    Currently in Pain?  Yes    Pain Score  5     Pain Location  Knee    Pain Orientation  Right             PT Long Term Goals - 01/30/19 0818      PT LONG TERM GOAL #1   Title  Patient will demonstrate improved function as evidenced by a score of 59 on FOTO measure for full participation in activities at home and in the community.    Baseline  IE: 40; 12/10: 42; 12/31: 44    Time  8    Period  Weeks    Status  On-going    Target Date  03/06/19      PT LONG TERM GOAL #2   Title  Patient  will demonstrate independence with HEP in order to maximize therapeutic gains and improve carryover from physical therapy sessions to ADLs in the home and community.    Baseline  IE: provided; 12/10: IND    Status  Achieved      PT LONG TERM GOAL #3   Title  Patient will demonstrate improved strength of BLE as evidenced by 1/2 increase in MMT for increased function at home and in the community.    Baseline  IE: RLE 3+, LLE 4; 12/10: RLE 4, LLE 5    Time  8    Period  Weeks    Status  On-going    Target Date  03/06/19      PT LONG TERM GOAL #4   Title  Patient will demonstrate improved R knee ROM with decreased pain to R knee flexion > 120degrees and R knee extension 0 degrees for increased  function at home and in the community.    Baseline  IE: R knee flexion 114 degrees painful, extension lacking 7 degrees; 12/31: R knee ext: 0 degrees, R knee flexion 118 degrees (painful from 95-105)    Time  8    Period  Weeks    Status  New    Target Date  03/06/19      PT LONG TERM GOAL #5   Title  Patient will report >2 point NPRS improvement in worst pain in a given 7 day period for improved overall QOL.    Baseline  IE: 7/10; 12/10: 6/10; 12/31: 4-5/10    Time  8    Period  Weeks    Status  On-going    Target Date  03/06/19              Patient will benefit from skilled therapeutic intervention in order to improve the following deficits and impairments:     Visit Diagnosis: Muscle weakness (generalized)  Right knee pain, unspecified chronicity  Difficulty in walking, not elsewhere classified     Problem List Patient Active Problem List   Diagnosis Date Noted  . H/O total vaginal hysterectomy 03/12/2017  . Essential hypertension 03/08/2017  . Mixed dyslipidemia 03/08/2017  . Abnormal EKG 03/08/2017  . Preoperative cardiovascular examination 03/08/2017  . Allergic state 12/11/2013  . High cholesterol 12/11/2013  . History of depression 12/11/2013  . Hypothyroidism 12/11/2013  . Sleep apnea 12/11/2013   Myles Gip PT, DPT [phone] 02/11/2019, 8:21 AM  [facility] [address] [place], [place], [address] Phone: [phone]   Fax:  [phone]  Name: Jordan Palmer MRN: [id number] Date of Birth: 1950-07-06

## 2019-02-12 ENCOUNTER — Other Ambulatory Visit: Payer: Self-pay | Admitting: Orthopedic Surgery

## 2019-02-13 ENCOUNTER — Other Ambulatory Visit: Payer: Self-pay

## 2019-02-13 ENCOUNTER — Ambulatory Visit
Admission: RE | Admit: 2019-02-13 | Discharge: 2019-02-13 | Disposition: A | Discharge status: Home / Self Care | Payer: Medicare Other | Attending: Orthopedic Surgery | Admitting: Orthopedic Surgery

## 2019-02-13 ENCOUNTER — Ambulatory Visit: Payer: Medicare Other | Admitting: Anesthesiology

## 2019-02-13 ENCOUNTER — Encounter: Payer: Self-pay | Admitting: Orthopedic Surgery

## 2019-02-13 ENCOUNTER — Encounter
Admission: RE | Disposition: A | Discharge status: Home / Self Care | Payer: Self-pay | Source: Home / Self Care | Attending: Orthopedic Surgery

## 2019-02-13 DIAGNOSIS — Z833 Family history of diabetes mellitus: Secondary | ICD-10-CM | POA: Diagnosis not present

## 2019-02-13 DIAGNOSIS — Z9079 Acquired absence of other genital organ(s): Secondary | ICD-10-CM | POA: Insufficient documentation

## 2019-02-13 DIAGNOSIS — S83241A Other tear of medial meniscus, current injury, right knee, initial encounter: Secondary | ICD-10-CM | POA: Diagnosis present

## 2019-02-13 DIAGNOSIS — E890 Postprocedural hypothyroidism: Secondary | ICD-10-CM | POA: Diagnosis not present

## 2019-02-13 DIAGNOSIS — S83281A Other tear of lateral meniscus, current injury, right knee, initial encounter: Secondary | ICD-10-CM | POA: Diagnosis not present

## 2019-02-13 DIAGNOSIS — I10 Essential (primary) hypertension: Secondary | ICD-10-CM | POA: Diagnosis not present

## 2019-02-13 DIAGNOSIS — M659 Synovitis and tenosynovitis, unspecified: Secondary | ICD-10-CM | POA: Insufficient documentation

## 2019-02-13 DIAGNOSIS — E785 Hyperlipidemia, unspecified: Secondary | ICD-10-CM | POA: Insufficient documentation

## 2019-02-13 DIAGNOSIS — Z8249 Family history of ischemic heart disease and other diseases of the circulatory system: Secondary | ICD-10-CM | POA: Diagnosis not present

## 2019-02-13 DIAGNOSIS — X58XXXA Exposure to other specified factors, initial encounter: Secondary | ICD-10-CM | POA: Insufficient documentation

## 2019-02-13 DIAGNOSIS — G4730 Sleep apnea, unspecified: Secondary | ICD-10-CM | POA: Insufficient documentation

## 2019-02-13 DIAGNOSIS — Z79899 Other long term (current) drug therapy: Secondary | ICD-10-CM | POA: Insufficient documentation

## 2019-02-13 DIAGNOSIS — Z79890 Hormone replacement therapy (postmenopausal): Secondary | ICD-10-CM | POA: Insufficient documentation

## 2019-02-13 DIAGNOSIS — M1711 Unilateral primary osteoarthritis, right knee: Secondary | ICD-10-CM | POA: Insufficient documentation

## 2019-02-13 HISTORY — PX: KNEE ARTHROSCOPY WITH MEDIAL MENISECTOMY: SHX5651

## 2019-02-13 LAB — BASIC METABOLIC PANEL
Anion gap: 9 (ref 5–15)
BUN: 17 mg/dL (ref 8–23)
CO2: 28 mmol/L (ref 22–32)
Calcium: 9.8 mg/dL (ref 8.9–10.3)
Chloride: 104 mmol/L (ref 98–111)
Creatinine, Ser: 0.84 mg/dL (ref 0.44–1.00)
GFR calc Af Amer: >60 mL/min (ref >60)
GFR calc non Af Amer: >60 mL/min (ref >60)
Glucose, Bld: 105 mg/dL — ABNORMAL HIGH (ref 70–99)
Potassium: 3.7 mmol/L (ref 3.5–5.1)
Sodium: 141 mmol/L (ref 135–145)

## 2019-02-13 LAB — CBC WITH DIFFERENTIAL/PLATELET
Abs Immature Granulocytes: 0.04 10*3/uL (ref 0.00–0.07)
Basophils Absolute: 0.1 10*3/uL (ref 0.0–0.1)
Basophils Relative: 1 %
Eosinophils Absolute: 0.2 10*3/uL (ref 0.0–0.5)
Eosinophils Relative: 4 %
HCT: 35.9 % — ABNORMAL LOW (ref 36.0–46.0)
Hemoglobin: 12.3 g/dL (ref 12.0–15.0)
Immature Granulocytes: 1 %
Lymphocytes Relative: 33 %
Lymphs Abs: 2 10*3/uL (ref 0.7–4.0)
MCH: 31.9 pg (ref 26.0–34.0)
MCHC: 34.3 g/dL (ref 30.0–36.0)
MCV: 93.2 fL (ref 80.0–100.0)
Monocytes Absolute: 0.5 10*3/uL (ref 0.1–1.0)
Monocytes Relative: 8 %
Neutro Abs: 3.2 10*3/uL (ref 1.7–7.7)
Neutrophils Relative %: 53 %
Platelets: 258 10*3/uL (ref 150–400)
RBC: 3.85 MIL/uL — ABNORMAL LOW (ref 3.87–5.11)
RDW: 13.1 % (ref 11.5–15.5)
WBC: 6 10*3/uL (ref 4.0–10.5)
nRBC: 0 % (ref 0.0–0.2)

## 2019-02-13 LAB — PROTIME-INR
INR: 1 (ref 0.8–1.2)
Prothrombin Time: 13 seconds (ref 11.4–15.2)

## 2019-02-13 LAB — APTT: aPTT: 34 seconds (ref 24–36)

## 2019-02-13 SURGERY — ARTHROSCOPY, KNEE, WITH MEDIAL MENISCECTOMY
Anesthesia: General | Site: Knee | Laterality: Right

## 2019-02-13 MED ORDER — PHENYLEPHRINE HCL (PRESSORS) 10 MG/ML IV SOLN
INTRAVENOUS | Status: DC | PRN
  Administered 2019-02-13 (×4): 100 ug via INTRAVENOUS
  Administered 2019-02-13: 200 ug via INTRAVENOUS
  Administered 2019-02-13 (×2): 100 ug via INTRAVENOUS
  Administered 2019-02-13: 200 ug via INTRAVENOUS

## 2019-02-13 MED ORDER — LIDOCAINE HCL URETHRAL/MUCOSAL 2 % EX GEL
CUTANEOUS | Status: AC
  Filled 2019-02-13: qty 5

## 2019-02-13 MED ORDER — KETOROLAC TROMETHAMINE 15 MG/ML IJ SOLN: 30.0000 mg | Freq: Once | INTRAMUSCULAR | Status: DC

## 2019-02-13 MED ORDER — ONDANSETRON HCL 4 MG/2ML IJ SOLN
INTRAMUSCULAR | Status: AC
  Filled 2019-02-13: qty 2

## 2019-02-13 MED ORDER — DEXAMETHASONE SODIUM PHOSPHATE 10 MG/ML IJ SOLN
INTRAMUSCULAR | Status: AC
  Filled 2019-02-13: qty 1

## 2019-02-13 MED ORDER — CHLORHEXIDINE GLUCONATE CLOTH 2 % EX PADS
6.0000 | MEDICATED_PAD | Freq: Once | CUTANEOUS | Status: AC
  Administered 2019-02-13: 6 via TOPICAL

## 2019-02-13 MED ORDER — LACTATED RINGERS IV SOLN: INTRAVENOUS | Status: DC

## 2019-02-13 MED ORDER — ASPIRIN EC 325 MG PO TBEC: 325.0000 mg | DELAYED_RELEASE_TABLET | Freq: Every day | ORAL | 0 refills | Status: DC

## 2019-02-13 MED ORDER — MIDAZOLAM HCL 2 MG/2ML IJ SOLN
INTRAMUSCULAR | Status: AC
  Filled 2019-02-13: qty 2

## 2019-02-13 MED ORDER — FENTANYL CITRATE (PF) 100 MCG/2ML IJ SOLN
INTRAMUSCULAR | Status: AC
  Filled 2019-02-13: qty 2

## 2019-02-13 MED ORDER — SEVOFLURANE IN SOLN
RESPIRATORY_TRACT | Status: AC
  Filled 2019-02-13: qty 250

## 2019-02-13 MED ORDER — LIDOCAINE HCL (CARDIAC) PF 100 MG/5ML IV SOSY
PREFILLED_SYRINGE | INTRAVENOUS | Status: DC | PRN
  Administered 2019-02-13: 80 mg via INTRAVENOUS

## 2019-02-13 MED ORDER — MIDAZOLAM HCL 2 MG/2ML IJ SOLN
INTRAMUSCULAR | Status: DC | PRN
  Administered 2019-02-13: 2 mg via INTRAVENOUS

## 2019-02-13 MED ORDER — FENTANYL CITRATE (PF) 100 MCG/2ML IJ SOLN
25.0000 ug | INTRAMUSCULAR | Status: DC | PRN
  Administered 2019-02-13 (×4): 25 ug via INTRAVENOUS

## 2019-02-13 MED ORDER — PROPOFOL 10 MG/ML IV BOLUS
INTRAVENOUS | Status: DC | PRN
  Administered 2019-02-13: 150 mg via INTRAVENOUS

## 2019-02-13 MED ORDER — FENTANYL CITRATE (PF) 100 MCG/2ML IJ SOLN
INTRAMUSCULAR | Status: DC | PRN
  Administered 2019-02-13 (×4): 25 ug via INTRAVENOUS
  Administered 2019-02-13: 50 ug via INTRAVENOUS
  Administered 2019-02-13: 25 ug via INTRAVENOUS

## 2019-02-13 MED ORDER — LIDOCAINE HCL (PF) 2 % IJ SOLN
INTRAMUSCULAR | Status: AC
  Filled 2019-02-13: qty 10

## 2019-02-13 MED ORDER — ONDANSETRON HCL 4 MG PO TABS: 4.0000 mg | ORAL_TABLET | Freq: Three times a day (TID) | ORAL | 0 refills | Status: DC | PRN

## 2019-02-13 MED ORDER — BUPIVACAINE HCL (PF) 0.25 % IJ SOLN
INTRAMUSCULAR | Status: AC
  Filled 2019-02-13: qty 30

## 2019-02-13 MED ORDER — ONDANSETRON HCL 4 MG/2ML IJ SOLN: 4.0000 mg | Freq: Once | INTRAMUSCULAR | Status: DC | PRN

## 2019-02-13 MED ORDER — FAMOTIDINE 20 MG PO TABS
20.0000 mg | ORAL_TABLET | Freq: Once | ORAL | Status: AC
  Administered 2019-02-13: 08:00:00 20 mg via ORAL

## 2019-02-13 MED ORDER — HYDROCODONE-ACETAMINOPHEN 5-325 MG PO TABS: 1.0000 | ORAL_TABLET | ORAL | 0 refills | Status: DC | PRN

## 2019-02-13 MED ORDER — DEXAMETHASONE SODIUM PHOSPHATE 10 MG/ML IJ SOLN
INTRAMUSCULAR | Status: DC | PRN
  Administered 2019-02-13: 8 mg via INTRAVENOUS

## 2019-02-13 MED ORDER — KETOROLAC TROMETHAMINE 30 MG/ML IJ SOLN
INTRAMUSCULAR | Status: AC
  Administered 2019-02-13: 30 mg
  Filled 2019-02-13: qty 1

## 2019-02-13 MED ORDER — ACETAMINOPHEN 10 MG/ML IV SOLN
INTRAVENOUS | Status: AC
  Filled 2019-02-13: qty 100

## 2019-02-13 MED ORDER — HYDROCODONE-ACETAMINOPHEN 5-325 MG PO TABS
1.0000 | ORAL_TABLET | ORAL | Status: DC | PRN
  Administered 2019-02-13: 1 via ORAL

## 2019-02-13 MED ORDER — FAMOTIDINE 20 MG PO TABS
ORAL_TABLET | ORAL | Status: AC
  Filled 2019-02-13: qty 1

## 2019-02-13 MED ORDER — EPINEPHRINE PF 1 MG/ML IJ SOLN
INTRAMUSCULAR | Status: AC
  Filled 2019-02-13: qty 1

## 2019-02-13 MED ORDER — PROPOFOL 10 MG/ML IV BOLUS
INTRAVENOUS | Status: AC
  Filled 2019-02-13: qty 20

## 2019-02-13 MED ORDER — ONDANSETRON HCL 4 MG/2ML IJ SOLN
INTRAMUSCULAR | Status: DC | PRN
  Administered 2019-02-13: 4 mg via INTRAVENOUS

## 2019-02-13 MED ORDER — FENTANYL CITRATE (PF) 100 MCG/2ML IJ SOLN
INTRAMUSCULAR | Status: AC
  Administered 2019-02-13: 25 ug via INTRAVENOUS
  Filled 2019-02-13: qty 2

## 2019-02-13 MED ORDER — CHLORHEXIDINE GLUCONATE CLOTH 2 % EX PADS: 6.0000 | MEDICATED_PAD | Freq: Once | CUTANEOUS | Status: DC

## 2019-02-13 MED ORDER — LIDOCAINE HCL (PF) 1 % IJ SOLN
INTRAMUSCULAR | Status: AC
  Filled 2019-02-13: qty 30

## 2019-02-13 MED ORDER — HYDROMORPHONE HCL 1 MG/ML IJ SOLN
0.5000 mg | INTRAMUSCULAR | Status: DC | PRN
  Administered 2019-02-13: 0.5 mg via INTRAVENOUS

## 2019-02-13 MED ORDER — ACETAMINOPHEN 10 MG/ML IV SOLN
INTRAVENOUS | Status: DC | PRN
  Administered 2019-02-13: 1000 mg via INTRAVENOUS

## 2019-02-13 MED ORDER — CEFAZOLIN SODIUM-DEXTROSE 2-4 GM/100ML-% IV SOLN
2.0000 g | INTRAVENOUS | Status: AC
  Administered 2019-02-13: 2 g via INTRAVENOUS

## 2019-02-13 MED ORDER — LIDOCAINE HCL (PF) 1 % IJ SOLN
INTRAMUSCULAR | Status: DC | PRN
  Administered 2019-02-13: 4 mL

## 2019-02-13 MED ORDER — CEFAZOLIN SODIUM-DEXTROSE 2-4 GM/100ML-% IV SOLN
INTRAVENOUS | Status: AC
  Filled 2019-02-13: qty 100

## 2019-02-13 MED ORDER — HYDROCODONE-ACETAMINOPHEN 5-325 MG PO TABS
ORAL_TABLET | ORAL | Status: AC
  Filled 2019-02-13: qty 1

## 2019-02-13 MED ORDER — BUPIVACAINE-EPINEPHRINE (PF) 0.25% -1:200000 IJ SOLN
INTRAMUSCULAR | Status: DC | PRN
  Administered 2019-02-13: 20 mL via PERINEURAL

## 2019-02-13 MED ORDER — HYDROMORPHONE HCL 1 MG/ML IJ SOLN
INTRAMUSCULAR | Status: AC
  Administered 2019-02-13: 0.5 mg via INTRAVENOUS
  Filled 2019-02-13: qty 1

## 2019-02-13 SURGICAL SUPPLY — 35 items
BUR RADIUS 3.5 (BURR) ×3 IMPLANT
BUR RADIUS 4.0X18.5 (BURR) ×3 IMPLANT
CLOSURE WOUND 1/2 X4 (GAUZE/BANDAGES/DRESSINGS) ×1
COVER WAND RF STERILE (DRAPES) ×3 IMPLANT
CUFF TOURN 24 STER (MISCELLANEOUS) ×3 IMPLANT
CUFF TOURN 30 STER DUAL PORT (MISCELLANEOUS) IMPLANT
DRAPE IMP U-DRAPE 54X76 (DRAPES) ×3 IMPLANT
DURAPREP 26ML APPLICATOR (WOUND CARE) ×3 IMPLANT
GAUZE SPONGE 4X4 12PLY STRL (GAUZE/BANDAGES/DRESSINGS) ×3 IMPLANT
GAUZE XEROFORM 1X8 LF (GAUZE/BANDAGES/DRESSINGS) ×3 IMPLANT
GLOVE BIOGEL PI IND STRL 9 (GLOVE) ×1 IMPLANT
GLOVE BIOGEL PI INDICATOR 9 (GLOVE) ×2
GLOVE SURG 9.0 ORTHO LTXF (GLOVE) ×9 IMPLANT
GOWN STRL REUS W/ TWL LRG LVL3 (GOWN DISPOSABLE) ×1 IMPLANT
GOWN STRL REUS W/TWL 2XL LVL3 (GOWN DISPOSABLE) ×3 IMPLANT
GOWN STRL REUS W/TWL LRG LVL3 (GOWN DISPOSABLE) ×2
IV LACTATED RINGER IRRG 3000ML (IV SOLUTION) ×20
IV LR IRRIG 3000ML ARTHROMATIC (IV SOLUTION) ×10 IMPLANT
KIT TURNOVER KIT A (KITS) ×3 IMPLANT
MANIFOLD NEPTUNE II (INSTRUMENTS) ×3 IMPLANT
MAT ABSORB  FLUID 56X50 GRAY (MISCELLANEOUS) ×2
MAT ABSORB FLUID 56X50 GRAY (MISCELLANEOUS) ×1 IMPLANT
NEEDLE HYPO 22GX1.5 SAFETY (NEEDLE) ×3 IMPLANT
PACK ARTHROSCOPY KNEE (MISCELLANEOUS) ×3 IMPLANT
PAD ABD DERMACEA PRESS 5X9 (GAUZE/BANDAGES/DRESSINGS) ×6 IMPLANT
SET TUBE SUCT SHAVER OUTFL 24K (TUBING) ×3 IMPLANT
SOL PREP PVP 2OZ (MISCELLANEOUS)
SOLUTION PREP PVP 2OZ (MISCELLANEOUS) IMPLANT
STRIP CLOSURE SKIN 1/2X4 (GAUZE/BANDAGES/DRESSINGS) ×2 IMPLANT
SUT ETHILON 4-0 (SUTURE) ×2
SUT ETHILON 4-0 FS2 18XMFL BLK (SUTURE) ×1
SUTURE ETHLN 4-0 FS2 18XMF BLK (SUTURE) ×1 IMPLANT
TAPE STRIPS DRAPE STRL (GAUZE/BANDAGES/DRESSINGS) ×3 IMPLANT
TUBING ARTHRO INFLOW-ONLY STRL (TUBING) ×3 IMPLANT
WAND HAND CNTRL MULTIVAC 90 (MISCELLANEOUS) ×3 IMPLANT

## 2019-02-13 NOTE — OR Nursing (Signed)
Dr Ronelle Nigh reviewed EKG from this am. No new orders.

## 2019-02-13 NOTE — Anesthesia Procedure Notes (Signed)
Procedure Name: LMA Insertion Date/Time: 02/13/2019 10:13 AM Performed by: Allean Found, CRNA Pre-anesthesia Checklist: Patient identified, Patient being monitored, Timeout performed, Emergency Drugs available and Suction available Patient Re-evaluated:Patient Re-evaluated prior to induction Oxygen Delivery Method: Circle system utilized Preoxygenation: Pre-oxygenation with 100% oxygen Induction Type: IV induction Ventilation: Mask ventilation without difficulty LMA: LMA inserted LMA Size: 4.0 Tube type: Oral Number of attempts: 1 Placement Confirmation: positive ETCO2 and breath sounds checked- equal and bilateral Tube secured with: Tape Dental Injury: Teeth and Oropharynx as per pre-operative assessment

## 2019-02-13 NOTE — Discharge Instructions (Signed)

## 2019-02-13 NOTE — OR Nursing (Signed)
Prefers to use walker instead of crutches.  Spouse to pick up one on the way home.

## 2019-02-13 NOTE — Transfer of Care (Signed)
Immediate Anesthesia Transfer of Care Note  Patient: Jordan Palmer  Procedure(s) Performed: KNEE ARTHROSCOPY WITH MEDIAL MENISECTOMY (Right Knee)  Patient Location: PACU  Anesthesia Type:General  Level of Consciousness: sedated  Airway & Oxygen Therapy: Patient Spontanous Breathing and Patient connected to face mask oxygen  Post-op Assessment: Report given to RN and Post -op Vital signs reviewed and stable  Post vital signs: Reviewed and stable  Last Vitals:  Vitals Value Taken Time  BP 111/70 02/13/19 1216  Temp    Pulse 75 02/13/19 1225  Resp 31 02/13/19 1225  SpO2 99 % 02/13/19 1225  Vitals shown include unvalidated device data.  Last Pain:  Vitals:   02/13/19 0802  TempSrc: Tympanic  PainSc: 4          Complications: No apparent anesthesia complications

## 2019-02-13 NOTE — Op Note (Signed)
PATIENT:  Jordan Palmer  PRE-OPERATIVE DIAGNOSIS:  MEDIAL AND LATERAL MENISCUS TEARS, RIGHT KNEE  POST-OPERATIVE DIAGNOSIS:  MEDIAL AND LATERAL MENISCUS TEARS, RIGHT KNEE WITH TRICOMPARTMENTAL OSTEOARTHRITIS, SYNOVITIS   PROCEDURE:  RIGHT KNEE ARTHROSCOPY WITH  Partial MEDIAL and LATERAL MENISECTOMIES, tricompartmental chondromalacia and extensive synovectomy  SURGEON:  Thornton Park, MD  ANESTHESIA:   General  PREOPERATIVE INDICATIONS:  Jordan Palmer  69 y.o. female with a diagnosis of TEAR OF MEDIAL MENISCUS who failed conservative treatment and elected for surgical management.    The risks benefits and alternatives were discussed with the patient preoperatively including the risks of infection, bleeding, nerve injury, knee stiffness, persistent pain, osteoarthritis and the need for further surgery. Medical  risks include DVT and pulmonary embolism, myocardial infarction, stroke, pneumonia, respiratory failure and death. The patient understood these risks and wished to proceed.   OPERATIVE FINDINGS: Tricompartmental osteoarthritis with areas of grade IV chondromalacia of the lateral femoral condyle, medial femoral condyle, lateral tibial plateau and trochlea. Patient had fraying and softening of the cartilage of the medial tibial plateau and the undersurface of the patella patient had complex tearing of both the posterior horn of the medial meniscus and anterior horn and body of the lateral meniscus with near full-thickness radial components to both tears. Patient had extensive synovitis throughout the knee.  OPERATIVE PROCEDURE: Patient was met in the preoperative area where preop H&P was performed. The operative extremity was signed with the word yes and my initials according the hospital's correct site of surgery protocol.  The patient was brought to the operating room where she was placed supine on the operative table. General anesthesia was administered. The patient was prepped  and draped in a sterile fashion.  A timeout was performed to verify the patient's name, date of birth, medical record number, correct site of surgery correct procedure to be performed. It was also used to verify the patient received antibiotics that all appropriate instruments, and radiographic studies were available in the room. Once all in attendance were in agreement, the case began.  Proposed arthroscopy incisions were drawn out with a surgical marker. These were pre-injected with 1% lidocaine plain. An 11 blade was used to establish an inferior lateral and inferomedial portals. The inferomedial portal was created using a 18-gauge spinal needle under direct visualization.  A full diagnostic examination of the knee was performed including the suprapatellar pouch, patellofemoral joint, medial lateral compartments as well as the medial and lateral gutters, the intercondylar notch in the posterior knee.  Patient had the medial meniscal tear addressed first. This was treated with a 4.0 resector shaver blade and straight duckbill basket. The medial femoral condyle had significant areas of cartilage loss and loose cartilaginous flaps which were also debrided with a 4.0 resector shaver blade. There was a radial component to the medial meniscus tear extending through the posterior horn near the meniscal root. The meniscus was debrided until a stable rim was achieved, which was confirmed with a hook probe.    Patient's ACL had frayed fibers but was not torn. Patient's leg was placed in a figure 4 position and the lateral meniscus was found to have extensive tearing including a full radial component of the mid body. The lateral meniscus tear was complex and involves several flaps. The lateral meniscus was debrided with  Straight biter, left angled biter and back biter. Again the meniscus was debrided until a stable rim was achieved. Patient had diffuse full-thickness cartilage loss of the lateral compartment,  both on the femoral condyle and lateral tibial plateau.  A chondroplasty of the undersurface of the trochlea was also performed using a 4-0 resector shaver blade. An extensive synovectomy of the anterior knee, medial lateral gutters and suprapatellar pouch was also performed using a 90 ArthroCare wand.  The knee was then copiously lavaged. All arthroscopic instruments were removed. The two arthroscopy portals were closed with 4-0 nylon. Steri-Strips were applied along with a dry sterile and compressive dressing. 0.25 % Marcaine with epi was injected intra-articularly for assistance with postoperative pain control. The patient was brought to the PACU in stable condition. I was scrubbed and present for the entire case and all sharp and instrument counts were correct at the conclusion the case. I spoke with the patient's husband postoperatively to let him know the case was performed without complication and the patient was stable in the recovery room.   Timoteo Gaul, MD

## 2019-02-13 NOTE — Anesthesia Postprocedure Evaluation (Signed)
Anesthesia Post Note  Patient: Jordan Palmer  Procedure(s) Performed: KNEE ARTHROSCOPY WITH MEDIAL MENISECTOMY (Right Knee)  Patient location during evaluation: PACU Anesthesia Type: General Level of consciousness: awake and alert Pain management: pain level controlled Vital Signs Assessment: post-procedure vital signs reviewed and stable Respiratory status: spontaneous breathing and respiratory function stable Cardiovascular status: stable Anesthetic complications: no     Last Vitals:  Vitals:   02/13/19 1415 02/13/19 1449  BP: (!) 146/74 (!) 142/78  Pulse: 65 74  Resp: 16 16  Temp: (!) 36.3 C   SpO2: 99% 98%    Last Pain:  Vitals:   02/13/19 1449  TempSrc:   PainSc: 3                  Glendell Fouse K

## 2019-02-13 NOTE — Anesthesia Preprocedure Evaluation (Signed)
Anesthesia Evaluation  Patient identified by MRN, date of birth, ID band Patient awake    Reviewed: Allergy & Precautions, NPO status , Patient's Chart, lab work & pertinent test results  History of Anesthesia Complications Negative for: history of anesthetic complications  Airway Mallampati: III       Dental   Pulmonary sleep apnea and Continuous Positive Airway Pressure Ventilation , neg COPD, Not current smoker,           Cardiovascular hypertension, Pt. on medications (-) Past MI and (-) CHF (-) dysrhythmias (-) Valvular Problems/Murmurs     Neuro/Psych neg Seizures    GI/Hepatic Neg liver ROS, neg GERD  ,  Endo/Other  neg diabetesHypothyroidism (s/p partial thyroidectomy)   Renal/GU negative Renal ROS     Musculoskeletal   Abdominal   Peds  Hematology   Anesthesia Other Findings   Reproductive/Obstetrics                             Anesthesia Physical Anesthesia Plan  ASA: II  Anesthesia Plan: General   Post-op Pain Management:    Induction: Intravenous  PONV Risk Score and Plan: 3 and Ondansetron, Dexamethasone and Midazolam  Airway Management Planned: LMA  Additional Equipment:   Intra-op Plan:   Post-operative Plan:   Informed Consent: I have reviewed the patients History and Physical, chart, labs and discussed the procedure including the risks, benefits and alternatives for the proposed anesthesia with the patient or authorized representative who has indicated his/her understanding and acceptance.       Plan Discussed with:   Anesthesia Plan Comments:         Anesthesia Quick Evaluation

## 2019-02-13 NOTE — H&P (Signed)
PREOPERATIVE H&P  Chief Complaint: Medial and lateral meniscus tears, right knee  HPI: Jordan Palmer is a 69 y.o. female who presents for preoperative history and physical with a diagnosis of medial and lateral meniscus tear of the right knee confirmed by MRI. Symptoms of pain, swelling and mechanical symptoms of clicking or popping are significantly impairing activities of daily living.  She has agreed with surgical management of the meniscal tears.   Past Medical History:  Diagnosis Date  . Arthritis   . Hyperlipemia   . Hypertension   . Hypothyroidism   . Sleep apnea   . Vertigo    Past Surgical History:  Procedure Laterality Date  . ANTERIOR AND POSTERIOR REPAIR N/A 03/12/2017   Procedure: ANTERIOR (CYSTOCELE) AND POSTERIOR REPAIR (RECTOCELE);  Surgeon: Salvadore Dom, MD;  Location: [facility];  Service: Gynecology;  Laterality: N/A;  . CARPAL TUNNEL RELEASE Bilateral   . CHOLECYSTECTOMY    . CYSTOSCOPY N/A 03/12/2017   Procedure: CYSTOSCOPY;  Surgeon: Salvadore Dom, MD;  Location: [facility];  Service: Gynecology;  Laterality: N/A;  . EYE SURGERY    . KNEE ARTHROSCOPY Left   . LACRIMAL DUCT PROBING W/ DACRYOPLASTY Bilateral   . SALPINGOOPHORECTOMY Left 03/12/2017   Procedure: SALPINGECTOMY;  Surgeon: Salvadore Dom, MD;  Location: [facility];  Service: Gynecology;  Laterality: Left;  . SHOULDER ARTHROSCOPY WITH OPEN ROTATOR CUFF REPAIR Right 09/03/2014   Procedure: SHOULDER ARTHROSCOPY WITH OPEN ROTATOR CUFF REPAIR;  Surgeon: Thornton Park, MD;  Location: [facility];  Service: Orthopedics;  Laterality: Right;  . THYROIDECTOMY Right    hemi-thyroidectomy  . VAGINAL HYSTERECTOMY N/A 03/12/2017   Procedure: HYSTERECTOMY VAGINAL;  Surgeon: Salvadore Dom, MD;  Location: [facility];  Service: Gynecology;  Laterality: N/A;   Social History   Socioeconomic History  . Marital status: Married     Spouse name: Not on file  . Number of children: Not on file  . Years of education: Not on file  . Highest education level: Not on file  Occupational History  . Not on file  Tobacco Use  . Smoking status: Never Smoker  . Smokeless tobacco: Never Used  Substance and Sexual Activity  . Alcohol use: Yes    Alcohol/week: 3.0 - 4.0 standard drinks    Types: 3 - 4 Standard drinks or equivalent per week    Comment: Occ  . Drug use: No  . Sexual activity: Yes    Partners: Male    Birth control/protection: Post-menopausal, Surgical  Other Topics Concern  . Not on file  Social History Narrative  . Not on file   Social Determinants of Health   Financial Resource Strain:   . Difficulty of Paying Living Expenses: Not on file  Food Insecurity:   . Worried About Charity fundraiser in the Last Year: Not on file  . Ran Out of Food in the Last Year: Not on file  Transportation Needs:   . Lack of Transportation (Medical): Not on file  . Lack of Transportation (Non-Medical): Not on file  Physical Activity:   . Days of Exercise per Week: Not on file  . Minutes of Exercise per Session: Not on file  Stress:   . Feeling of Stress : Not on file  Social Connections:   . Frequency of Communication with Friends and Family: Not on file  . Frequency of Social Gatherings with Friends and Family: Not on file  .  Attends Religious Services: Not on file  . Active Member of Clubs or Organizations: Not on file  . Attends Archivist Meetings: Not on file  . Marital Status: Not on file   Family History  Problem Relation Age of Onset  . Diabetes type II Father   . CAD Father   . Alzheimer's disease Father   . Alzheimer's disease Mother   . Breast cancer Neg Hx    Allergies  Allergen Reactions  . Codeine Nausea And Vomiting  . Doxycycline Photosensitivity   Prior to Admission medications   Medication Sig Start Date End Date Taking? Authorizing Provider  acetaminophen (TYLENOL) 500 MG  tablet Take 1,000 mg by mouth every 6 (six) hours as needed for moderate pain or headache.   Yes [provider]  Biotin (BIOTIN 5000) 5 MG CAPS Take 5 mg by mouth every other day.   Yes [provider]  Calcium Carb-Cholecalciferol (CALCIUM 600 + D PO) Take 1 tablet by mouth daily.   Yes [provider]  Carboxymethylcellul-Glycerin (LUBRICATING EYE DROPS OP) Place 1 drop into both eyes daily.   Yes [provider]  conjugated estrogens (PREMARIN) vaginal cream 1/2 gram vaginally twice weekly Patient taking differently: Place 1 Applicatorful vaginally 2 (two) times a week.  01/13/19  Yes Salvadore Dom, MD  Cyanocobalamin (B-12 PO) Take 1 tablet by mouth daily.   Yes [provider]  fexofenadine (ALLEGRA) 180 MG tablet Take 180 mg by mouth daily as needed for allergies.    Yes [provider]  levothyroxine (SYNTHROID) 88 MCG tablet Take 88 mcg by mouth daily before breakfast.   Yes [provider]  lisinopril-hydrochlorothiazide (PRINZIDE,ZESTORETIC) 20-25 MG per tablet Take 1 tablet by mouth daily.   Yes [provider]  rosuvastatin (CRESTOR) 5 MG tablet Take 5 mg by mouth every morning.  07/01/15  Yes [provider]  betamethasone valerate ointment (VALISONE) 0.1 % Apply 1 application topically 2 (two) times daily. Can use for up to 2 weeks as needed. Patient not taking: Reported on 02/11/2019 01/13/19   Salvadore Dom, MD  nystatin cream (MYCOSTATIN) Apply 1 application topically 2 (two) times daily. Apply to affected area BID for up to 7 days. Patient not taking: Reported on 02/11/2019 01/13/19   Salvadore Dom, MD     Positive ROS: All other systems have been reviewed and were otherwise negative with the exception of those mentioned in the HPI and as above.  Physical Exam: General: Alert, no acute distress Cardiovascular: Regular rate and rhythm, no murmurs rubs or gallops.  No pedal  edema Respiratory: Clear to auscultation bilaterally, no wheezes rales or rhonchi. No cyanosis, no use of accessory musculature GI: No organomegaly, abdomen is soft and non-tender nondistended with positive bowel sounds. Skin: Skin intact, no lesions within the operative field. Neurologic: Sensation intact distally Psychiatric: Patient is competent for consent with normal mood and affect Lymphatic: No cervical lymphadenopathy  MUSCULOSKELETAL: Right knee:  ROM 0-120 degrees.  Tenderness over the medial and lateral joint lines.  + McMurrays test.  No erythema, ecchymosis or large effusion.  No ligamentous laxity. NVI.  Assessment: Right knee medial and lateral meniscal tears  Plan: Plan for Procedure(s): RIGHT KNEE ARTHROSCOPIC PARTIAL MEDIAL AND LATERAL MENISECTOMIES  I have reviewed the details of the operation and the post-op course with the patient.   I discussed the risks and benefits of surgery. The risks include but are not limited to infection, bleeding, nerve or  blood vessel injury, joint stiffness or loss of motion, persistent pain, weakness or instability, retear of the meniscus and the need for further surgery. Medical risks include but are not limited to DVT and pulmonary embolism, myocardial infarction, stroke, pneumonia, respiratory failure and death. Patient understood these risks and wished to proceed.     Thornton Park, MD   02/13/2019 9:49 AM

## 2019-02-18 ENCOUNTER — Encounter: Payer: Medicare Other | Admitting: Physical Therapy

## 2019-02-25 ENCOUNTER — Encounter: Payer: Medicare Other | Admitting: Physical Therapy

## 2019-04-23 ENCOUNTER — Other Ambulatory Visit: Payer: Self-pay | Admitting: Orthopedic Surgery

## 2019-05-09 ENCOUNTER — Other Ambulatory Visit: Payer: Self-pay

## 2019-05-09 ENCOUNTER — Encounter
Admission: RE | Admit: 2019-05-09 | Discharge: 2019-05-09 | Disposition: A | Discharge status: Home / Self Care | Payer: Medicare Other | Source: Ambulatory Visit | Attending: Orthopedic Surgery | Admitting: Orthopedic Surgery

## 2019-05-09 NOTE — Patient Instructions (Signed)
Your procedure is scheduled on: May 20, 2019 Tuesday  Report to Day Surgery on the 2nd floor of the [facility]. To find out your arrival time, please call [phone] between 1PM - 3PM on: Monday May 20, 2019  REMEMBER: Instructions that are not followed completely may result in serious medical risk, up to and including death; or upon the discretion of your surgeon and anesthesiologist your surgery may need to be rescheduled.  Do not eat food after midnight the night before surgery.  No gum chewing, lozengers or hard candies.  You may however, drink CLEAR liquids up to 2 hours before you are scheduled to arrive for your surgery. Do not drink anything within 2 hours of the start of your surgery.  Clear liquids include: - water  - apple juice without pulp - gatorade (not RED) - black coffee or tea (Do NOT add milk or creamers to the coffee or tea) Do NOT drink anything that is not on this list.  Type 1 and Type 2 diabetics should only drink water.   TAKE THESE MEDICATIONS THE MORNING OF SURGERY WITH A SIP OF WATER: ALLEGRA SYNTHROID CRESTOR PAIN PILL IF NEEDED  USE FLONASE MORNING OF SURGERY.  DO NOT TAKE LISINOPRIL-HYDROCHLOROTHIAZIDE MORNING OF SURGERY.  Follow recommendations from Cardiologist, Pulmonologist or PCP regarding stopping Aspirin, Coumadin, Plavix, Eliquis, Pradaxa, or Pletal.  Stop Anti-inflammatories (NSAIDS) such as meloxicam  Advil, Aleve, Ibuprofen, Motrin, Naproxen, Naprosyn and Aspirin based products such as Excedrin, Goodys Powder, BC Powder. (May take Tylenol or Acetaminophen if needed.) Stop 05-13-2019.  Stop ANY OVER THE COUNTER supplements until after surgery. (May continue Vitamin D, Vitamin B, and multivitamin.)  No Alcohol for 24 hours before or after surgery.  No Smoking including e-cigarettes for 24 hours prior to surgery.  No chewable tobacco products for at least 6 hours prior to surgery.  No nicotine patches on the day of  surgery.  On the morning of surgery brush your teeth with toothpaste and water, you may rinse your mouth with mouthwash if you wish. Do not swallow any toothpaste or mouthwash.  Do not wear jewelry, make-up, hairpins, clips or nail polish.  Do not wear lotions, powders, or perfumes.   Do not shave 48 hours prior to surgery.   Contact lenses, hearing aids and dentures may not be worn into surgery.  Do not bring valuables to the hospital, including drivers license, insurance or credit cards.  [facility] is not responsible for any belongings or valuables.   Use CHG Soap as directed on instruction sheet.  Notify your doctor if there is any change in your medical condition (cold, fever, infection).  Wear comfortable clothing (specific to your surgery type) to the hospital.  Plan for stool softeners for home use.  If you are being admitted to the hospital overnight. You may bring a small bag to hospital if you wish. After surgery it may be brought to your room.  If you are being discharged the day of surgery, you will not be allowed to drive home. You will need a responsible adult to drive you home and stay with you that night.    If you are taking public transportation, you will need to have a responsible adult with you. Please confirm with your physician that it is acceptable to use public transportation.   Please call [phone] if you have any questions about these instructions.  Visitation Policy:  Patients undergoing a surgery or procedure in a hospital may have one  family member or support person with them as long as that person is not COVID-19 positive or experiencing its symptoms. That person may remain in the waiting area during the procedure. Should the patient need to stay at the hospital during part of their recovery, the support person may visit during visiting hours; 7 am to 8 pm.  Children under 31 years of age may have both parents or legal guardians with them  during their hospital stay.   Inpatient Visitation Update:  Two designated support people may visit a patient during visiting hours 7 am to 8 pm. It must be the same two designated people for the duration of the patient stay. The visitors may come and go during the day, and there is no switching out to have different visitors. A mask must be worn at all times, including in the patient room.

## 2019-05-16 ENCOUNTER — Other Ambulatory Visit
Admission: RE | Admit: 2019-05-16 | Discharge: 2019-05-16 | Disposition: A | Discharge status: Home / Self Care | Payer: Medicare Other | Source: Ambulatory Visit | Attending: Orthopedic Surgery | Admitting: Orthopedic Surgery

## 2019-05-16 DIAGNOSIS — Z20822 Contact with and (suspected) exposure to covid-19: Secondary | ICD-10-CM | POA: Insufficient documentation

## 2019-05-16 DIAGNOSIS — Z01812 Encounter for preprocedural laboratory examination: Secondary | ICD-10-CM | POA: Insufficient documentation

## 2019-05-16 LAB — CBC WITH DIFFERENTIAL/PLATELET
Abs Immature Granulocytes: 0.02 10*3/uL (ref 0.00–0.07)
Basophils Absolute: 0.1 10*3/uL (ref 0.0–0.1)
Basophils Relative: 1 %
Eosinophils Absolute: 0.2 10*3/uL (ref 0.0–0.5)
Eosinophils Relative: 3 %
HCT: 36.2 % (ref 36.0–46.0)
Hemoglobin: 12.4 g/dL (ref 12.0–15.0)
Immature Granulocytes: 0 %
Lymphocytes Relative: 23 %
Lymphs Abs: 1.7 10*3/uL (ref 0.7–4.0)
MCH: 31.6 pg (ref 26.0–34.0)
MCHC: 34.3 g/dL (ref 30.0–36.0)
MCV: 92.3 fL (ref 80.0–100.0)
Monocytes Absolute: 0.5 10*3/uL (ref 0.1–1.0)
Monocytes Relative: 7 %
Neutro Abs: 5.1 10*3/uL (ref 1.7–7.7)
Neutrophils Relative %: 66 %
Platelets: 277 10*3/uL (ref 150–400)
RBC: 3.92 MIL/uL (ref 3.87–5.11)
RDW: 12.9 % (ref 11.5–15.5)
WBC: 7.6 10*3/uL (ref 4.0–10.5)
nRBC: 0 % (ref 0.0–0.2)

## 2019-05-16 LAB — SURGICAL PCR SCREEN
MRSA, PCR: NEGATIVE
Staphylococcus aureus: NEGATIVE

## 2019-05-16 LAB — BASIC METABOLIC PANEL
Anion gap: 12 (ref 5–15)
BUN: 23 mg/dL (ref 8–23)
CO2: 23 mmol/L (ref 22–32)
Calcium: 9.8 mg/dL (ref 8.9–10.3)
Chloride: 101 mmol/L (ref 98–111)
Creatinine, Ser: 0.83 mg/dL (ref 0.44–1.00)
GFR calc Af Amer: >60 mL/min (ref >60)
GFR calc non Af Amer: >60 mL/min (ref >60)
Glucose, Bld: 99 mg/dL (ref 70–99)
Potassium: 3.8 mmol/L (ref 3.5–5.1)
Sodium: 136 mmol/L (ref 135–145)

## 2019-05-16 LAB — PROTIME-INR
INR: 1 (ref 0.8–1.2)
Prothrombin Time: 12.9 seconds (ref 11.4–15.2)

## 2019-05-16 LAB — SARS CORONAVIRUS 2 (TAT 6-24 HRS): SARS Coronavirus 2: NEGATIVE

## 2019-05-16 LAB — TYPE AND SCREEN
ABO/RH(D): A NEG
Antibody Screen: NEGATIVE

## 2019-05-16 LAB — APTT: aPTT: 34 seconds (ref 24–36)

## 2019-05-16 LAB — HEMOGLOBIN A1C
Hgb A1c MFr Bld: 5.8 % — ABNORMAL HIGH (ref 4.8–5.6)
Mean Plasma Glucose: 119.76 mg/dL

## 2019-05-20 ENCOUNTER — Encounter
Admission: RE | Disposition: A | Discharge status: Home Health | Payer: Self-pay | Source: Home / Self Care | Attending: Orthopedic Surgery

## 2019-05-20 ENCOUNTER — Inpatient Hospital Stay: Payer: Medicare Other | Admitting: Anesthesiology

## 2019-05-20 ENCOUNTER — Encounter: Payer: Self-pay | Admitting: Orthopedic Surgery

## 2019-05-20 ENCOUNTER — Inpatient Hospital Stay
Admission: RE | Admit: 2019-05-20 | Discharge: 2019-05-23 | DRG: 470 | Disposition: A | Discharge status: Home Health | Payer: Medicare Other | Attending: Orthopedic Surgery | Admitting: Orthopedic Surgery

## 2019-05-20 ENCOUNTER — Other Ambulatory Visit: Payer: Self-pay

## 2019-05-20 ENCOUNTER — Inpatient Hospital Stay: Payer: Medicare Other

## 2019-05-20 DIAGNOSIS — Z7982 Long term (current) use of aspirin: Secondary | ICD-10-CM | POA: Diagnosis not present

## 2019-05-20 DIAGNOSIS — Z79891 Long term (current) use of opiate analgesic: Secondary | ICD-10-CM | POA: Diagnosis not present

## 2019-05-20 DIAGNOSIS — M1711 Unilateral primary osteoarthritis, right knee: Principal | ICD-10-CM | POA: Diagnosis present

## 2019-05-20 DIAGNOSIS — E785 Hyperlipidemia, unspecified: Secondary | ICD-10-CM | POA: Diagnosis present

## 2019-05-20 DIAGNOSIS — Z596 Low income: Secondary | ICD-10-CM

## 2019-05-20 DIAGNOSIS — Z79890 Hormone replacement therapy (postmenopausal): Secondary | ICD-10-CM

## 2019-05-20 DIAGNOSIS — Z79899 Other long term (current) drug therapy: Secondary | ICD-10-CM

## 2019-05-20 DIAGNOSIS — G4730 Sleep apnea, unspecified: Secondary | ICD-10-CM | POA: Diagnosis present

## 2019-05-20 DIAGNOSIS — Z791 Long term (current) use of non-steroidal anti-inflammatories (NSAID): Secondary | ICD-10-CM | POA: Diagnosis not present

## 2019-05-20 DIAGNOSIS — I10 Essential (primary) hypertension: Secondary | ICD-10-CM | POA: Diagnosis present

## 2019-05-20 DIAGNOSIS — Z20822 Contact with and (suspected) exposure to covid-19: Secondary | ICD-10-CM | POA: Diagnosis present

## 2019-05-20 DIAGNOSIS — E039 Hypothyroidism, unspecified: Secondary | ICD-10-CM | POA: Diagnosis present

## 2019-05-20 DIAGNOSIS — Z9079 Acquired absence of other genital organ(s): Secondary | ICD-10-CM

## 2019-05-20 DIAGNOSIS — M25761 Osteophyte, right knee: Secondary | ICD-10-CM | POA: Diagnosis present

## 2019-05-20 DIAGNOSIS — Z96651 Presence of right artificial knee joint: Principal | ICD-10-CM

## 2019-05-20 HISTORY — PX: TOTAL KNEE ARTHROPLASTY: SHX125

## 2019-05-20 LAB — ABO/RH: ABO/RH(D): A NEG

## 2019-05-20 SURGERY — ARTHROPLASTY, KNEE, TOTAL
Anesthesia: Spinal | Site: Knee | Laterality: Right

## 2019-05-20 MED ORDER — CELECOXIB 200 MG PO CAPS
ORAL_CAPSULE | ORAL | Status: AC
  Administered 2019-05-20: 200 mg via ORAL
  Filled 2019-05-20: qty 1

## 2019-05-20 MED ORDER — FENTANYL CITRATE (PF) 100 MCG/2ML IJ SOLN
INTRAMUSCULAR | Status: AC
  Filled 2019-05-20: qty 2

## 2019-05-20 MED ORDER — CEFAZOLIN SODIUM-DEXTROSE 2-4 GM/100ML-% IV SOLN
2.0000 g | INTRAVENOUS | Status: AC
  Administered 2019-05-20: 2 g via INTRAVENOUS

## 2019-05-20 MED ORDER — MORPHINE SULFATE (PF) 4 MG/ML IV SOLN
INTRAVENOUS | Status: AC
  Filled 2019-05-20: qty 1

## 2019-05-20 MED ORDER — KETOROLAC TROMETHAMINE 30 MG/ML IJ SOLN
INTRAMUSCULAR | Status: DC | PRN
  Administered 2019-05-20: 30 mg via INTRAVENOUS

## 2019-05-20 MED ORDER — HYDROMORPHONE HCL 1 MG/ML IJ SOLN: 0.5000 mg | INTRAMUSCULAR | Status: DC | PRN

## 2019-05-20 MED ORDER — HYDROCHLOROTHIAZIDE 25 MG PO TABS
25.0000 mg | ORAL_TABLET | Freq: Every day | ORAL | Status: DC
  Administered 2019-05-21 – 2019-05-23 (×3): 25 mg via ORAL
  Filled 2019-05-20 (×3): qty 1

## 2019-05-20 MED ORDER — KETOROLAC TROMETHAMINE 30 MG/ML IJ SOLN
INTRAMUSCULAR | Status: AC
  Filled 2019-05-20: qty 1

## 2019-05-20 MED ORDER — LISINOPRIL-HYDROCHLOROTHIAZIDE 20-25 MG PO TABS: 1.0000 | ORAL_TABLET | Freq: Every day | ORAL | Status: DC

## 2019-05-20 MED ORDER — OXYCODONE HCL 5 MG PO TABS
ORAL_TABLET | ORAL | Status: AC
  Administered 2019-05-20: 15 mg via ORAL
  Filled 2019-05-20: qty 2

## 2019-05-20 MED ORDER — BUPIVACAINE-EPINEPHRINE (PF) 0.25% -1:200000 IJ SOLN
INTRAMUSCULAR | Status: AC
  Filled 2019-05-20: qty 60

## 2019-05-20 MED ORDER — BUPIVACAINE HCL (PF) 0.5 % IJ SOLN
INTRAMUSCULAR | Status: AC
  Filled 2019-05-20: qty 10

## 2019-05-20 MED ORDER — METHOCARBAMOL 1000 MG/10ML IJ SOLN
500.0000 mg | Freq: Four times a day (QID) | INTRAVENOUS | Status: DC | PRN
  Filled 2019-05-20: qty 5

## 2019-05-20 MED ORDER — CLINDAMYCIN PHOSPHATE 600 MG/50ML IV SOLN
600.0000 mg | Freq: Once | INTRAVENOUS | Status: AC
  Administered 2019-05-20: 600 mg via INTRAVENOUS

## 2019-05-20 MED ORDER — FAMOTIDINE 20 MG PO TABS: 20.0000 mg | ORAL_TABLET | Freq: Once | ORAL | Status: AC

## 2019-05-20 MED ORDER — TRAMADOL HCL 50 MG PO TABS
50.0000 mg | ORAL_TABLET | Freq: Four times a day (QID) | ORAL | Status: DC
  Administered 2019-05-20 – 2019-05-23 (×12): 50 mg via ORAL
  Filled 2019-05-20 (×12): qty 1

## 2019-05-20 MED ORDER — NEOMYCIN-POLYMYXIN B GU 40-200000 IR SOLN
Status: DC | PRN
  Administered 2019-05-20: 4 mL
  Administered 2019-05-20: 12 mL

## 2019-05-20 MED ORDER — LISINOPRIL 20 MG PO TABS
20.0000 mg | ORAL_TABLET | Freq: Every day | ORAL | Status: DC
  Administered 2019-05-21 – 2019-05-23 (×3): 20 mg via ORAL
  Filled 2019-05-20 (×3): qty 1

## 2019-05-20 MED ORDER — DIPHENHYDRAMINE HCL 12.5 MG/5ML PO ELIX: 12.5000 mg | ORAL_SOLUTION | ORAL | Status: DC | PRN

## 2019-05-20 MED ORDER — SEVOFLURANE IN SOLN
RESPIRATORY_TRACT | Status: AC
  Filled 2019-05-20: qty 250

## 2019-05-20 MED ORDER — ROSUVASTATIN CALCIUM 5 MG PO TABS
5.0000 mg | ORAL_TABLET | Freq: Every day | ORAL | Status: DC
  Administered 2019-05-21 – 2019-05-23 (×3): 5 mg via ORAL
  Filled 2019-05-20 (×3): qty 1

## 2019-05-20 MED ORDER — MORPHINE SULFATE 4 MG/ML IJ SOLN
INTRAMUSCULAR | Status: DC | PRN
  Administered 2019-05-20: 4 mg

## 2019-05-20 MED ORDER — CELECOXIB 200 MG PO CAPS: 200.0000 mg | ORAL_CAPSULE | ORAL | Status: AC

## 2019-05-20 MED ORDER — PROPOFOL 500 MG/50ML IV EMUL
INTRAVENOUS | Status: AC
  Filled 2019-05-20: qty 50

## 2019-05-20 MED ORDER — ONDANSETRON HCL 4 MG/2ML IJ SOLN
INTRAMUSCULAR | Status: AC
  Filled 2019-05-20: qty 2

## 2019-05-20 MED ORDER — KETOROLAC TROMETHAMINE 15 MG/ML IJ SOLN
15.0000 mg | Freq: Four times a day (QID) | INTRAMUSCULAR | Status: DC
  Administered 2019-05-20 – 2019-05-23 (×12): 15 mg via INTRAVENOUS
  Filled 2019-05-20 (×12): qty 1

## 2019-05-20 MED ORDER — NEOMYCIN-POLYMYXIN B GU 40-200000 IR SOLN
Status: AC
  Filled 2019-05-20: qty 20

## 2019-05-20 MED ORDER — LIDOCAINE HCL (PF) 2 % IJ SOLN
INTRAMUSCULAR | Status: AC
  Filled 2019-05-20: qty 10

## 2019-05-20 MED ORDER — FAMOTIDINE 20 MG PO TABS
ORAL_TABLET | ORAL | Status: AC
  Administered 2019-05-20: 20 mg via ORAL
  Filled 2019-05-20: qty 1

## 2019-05-20 MED ORDER — MIDAZOLAM HCL 2 MG/2ML IJ SOLN
INTRAMUSCULAR | Status: AC
  Filled 2019-05-20: qty 2

## 2019-05-20 MED ORDER — ESTROGENS, CONJUGATED 0.625 MG/GM VA CREA
1.0000 | TOPICAL_CREAM | VAGINAL | Status: DC
  Filled 2019-05-20: qty 30

## 2019-05-20 MED ORDER — CHLORHEXIDINE GLUCONATE CLOTH 2 % EX PADS
6.0000 | MEDICATED_PAD | Freq: Once | CUTANEOUS | Status: AC
  Administered 2019-05-20: 6 via TOPICAL

## 2019-05-20 MED ORDER — ENOXAPARIN SODIUM 40 MG/0.4ML ~~LOC~~ SOLN
40.0000 mg | SUBCUTANEOUS | Status: DC
  Administered 2019-05-21 – 2019-05-23 (×3): 40 mg via SUBCUTANEOUS
  Filled 2019-05-20 (×3): qty 0.4

## 2019-05-20 MED ORDER — CLINDAMYCIN PHOSPHATE 600 MG/50ML IV SOLN
600.0000 mg | Freq: Three times a day (TID) | INTRAVENOUS | Status: AC
  Administered 2019-05-20 – 2019-05-21 (×2): 600 mg via INTRAVENOUS
  Filled 2019-05-20 (×2): qty 50

## 2019-05-20 MED ORDER — METHOCARBAMOL 500 MG PO TABS
ORAL_TABLET | ORAL | Status: AC
  Administered 2019-05-21: 10:00:00 500 mg via ORAL
  Filled 2019-05-20: qty 1

## 2019-05-20 MED ORDER — ONDANSETRON HCL 4 MG PO TABS: 4.0000 mg | ORAL_TABLET | Freq: Four times a day (QID) | ORAL | Status: DC | PRN

## 2019-05-20 MED ORDER — BUPIVACAINE HCL (PF) 0.5 % IJ SOLN
INTRAMUSCULAR | Status: DC | PRN
  Administered 2019-05-20: 3 mL

## 2019-05-20 MED ORDER — HYDROMORPHONE HCL 1 MG/ML IJ SOLN
1.0000 mg | Freq: Once | INTRAMUSCULAR | Status: AC
  Administered 2019-05-20: 1 mg via INTRAVENOUS

## 2019-05-20 MED ORDER — MIDAZOLAM HCL 5 MG/5ML IJ SOLN
INTRAMUSCULAR | Status: DC | PRN
  Administered 2019-05-20 (×2): 1 mg via INTRAVENOUS

## 2019-05-20 MED ORDER — ACETAMINOPHEN 325 MG PO TABS
325.0000 mg | ORAL_TABLET | Freq: Four times a day (QID) | ORAL | Status: DC | PRN
  Administered 2019-05-21: 22:00:00 650 mg via ORAL
  Filled 2019-05-20: qty 2

## 2019-05-20 MED ORDER — ONDANSETRON HCL 4 MG/2ML IJ SOLN: 4.0000 mg | Freq: Once | INTRAMUSCULAR | Status: DC | PRN

## 2019-05-20 MED ORDER — METHOCARBAMOL 500 MG PO TABS
500.0000 mg | ORAL_TABLET | Freq: Four times a day (QID) | ORAL | Status: DC | PRN
  Administered 2019-05-20: 500 mg via ORAL
  Filled 2019-05-20: qty 1

## 2019-05-20 MED ORDER — ONDANSETRON HCL 4 MG/2ML IJ SOLN: 4.0000 mg | Freq: Four times a day (QID) | INTRAMUSCULAR | Status: DC | PRN

## 2019-05-20 MED ORDER — OXYCODONE HCL 5 MG PO TABS
ORAL_TABLET | ORAL | Status: AC
  Administered 2019-05-21: 15 mg via ORAL
  Filled 2019-05-20: qty 1

## 2019-05-20 MED ORDER — PROPOFOL 10 MG/ML IV BOLUS
INTRAVENOUS | Status: DC | PRN
  Administered 2019-05-20: 30 mg via INTRAVENOUS

## 2019-05-20 MED ORDER — TRANEXAMIC ACID-NACL 1000-0.7 MG/100ML-% IV SOLN
INTRAVENOUS | Status: AC
  Filled 2019-05-20: qty 100

## 2019-05-20 MED ORDER — GABAPENTIN 300 MG PO CAPS
300.0000 mg | ORAL_CAPSULE | Freq: Three times a day (TID) | ORAL | Status: DC
  Administered 2019-05-20 – 2019-05-23 (×8): 300 mg via ORAL
  Filled 2019-05-20 (×8): qty 1

## 2019-05-20 MED ORDER — ACETAMINOPHEN 500 MG PO TABS
ORAL_TABLET | ORAL | Status: AC
  Administered 2019-05-20: 1000 mg via ORAL
  Filled 2019-05-20: qty 2

## 2019-05-20 MED ORDER — LEVOTHYROXINE SODIUM 88 MCG PO TABS
88.0000 ug | ORAL_TABLET | Freq: Every day | ORAL | Status: DC
  Administered 2019-05-21 – 2019-05-23 (×3): 88 ug via ORAL
  Filled 2019-05-20 (×3): qty 1

## 2019-05-20 MED ORDER — GABAPENTIN 300 MG PO CAPS: 300.0000 mg | ORAL_CAPSULE | ORAL | Status: AC

## 2019-05-20 MED ORDER — CEFAZOLIN SODIUM-DEXTROSE 2-4 GM/100ML-% IV SOLN
INTRAVENOUS | Status: AC
  Filled 2019-05-20: qty 100

## 2019-05-20 MED ORDER — FLUTICASONE PROPIONATE 50 MCG/ACT NA SUSP
1.0000 | Freq: Every day | NASAL | Status: DC
  Administered 2019-05-22: 09:00:00 1 via NASAL
  Filled 2019-05-20: qty 16

## 2019-05-20 MED ORDER — TRANEXAMIC ACID-NACL 1000-0.7 MG/100ML-% IV SOLN
1000.0000 mg | INTRAVENOUS | Status: AC
  Administered 2019-05-20: 1000 mg via INTRAVENOUS

## 2019-05-20 MED ORDER — BUPIVACAINE LIPOSOME 1.3 % IJ SUSP
INTRAMUSCULAR | Status: AC
  Filled 2019-05-20: qty 20

## 2019-05-20 MED ORDER — FENTANYL CITRATE (PF) 100 MCG/2ML IJ SOLN: 25.0000 ug | INTRAMUSCULAR | Status: DC | PRN

## 2019-05-20 MED ORDER — DOCUSATE SODIUM 100 MG PO CAPS
100.0000 mg | ORAL_CAPSULE | Freq: Two times a day (BID) | ORAL | Status: DC
  Administered 2019-05-20 – 2019-05-22 (×4): 100 mg via ORAL
  Filled 2019-05-20 (×6): qty 1

## 2019-05-20 MED ORDER — OXYCODONE HCL 5 MG PO TABS
10.0000 mg | ORAL_TABLET | ORAL | Status: DC | PRN
  Administered 2019-05-21 – 2019-05-23 (×8): 15 mg via ORAL
  Filled 2019-05-20 (×4): qty 3
  Filled 2019-05-20: qty 2
  Filled 2019-05-20 (×7): qty 3

## 2019-05-20 MED ORDER — SODIUM CHLORIDE 0.9 % IV SOLN
INTRAVENOUS | Status: DC | PRN
  Administered 2019-05-20: 25 ug/min via INTRAVENOUS

## 2019-05-20 MED ORDER — LORATADINE 10 MG PO TABS
10.0000 mg | ORAL_TABLET | Freq: Every day | ORAL | Status: DC
  Administered 2019-05-21 – 2019-05-22 (×2): 10 mg via ORAL
  Filled 2019-05-20 (×2): qty 1

## 2019-05-20 MED ORDER — PROPOFOL 500 MG/50ML IV EMUL
INTRAVENOUS | Status: DC | PRN
  Administered 2019-05-20: 120 ug/kg/min via INTRAVENOUS

## 2019-05-20 MED ORDER — HYDROMORPHONE HCL 1 MG/ML IJ SOLN
INTRAMUSCULAR | Status: AC
  Administered 2019-05-20: 1 mg via INTRAVENOUS
  Filled 2019-05-20: qty 1

## 2019-05-20 MED ORDER — HYDROMORPHONE HCL 1 MG/ML IJ SOLN
INTRAMUSCULAR | Status: AC
  Filled 2019-05-20: qty 1

## 2019-05-20 MED ORDER — SODIUM CHLORIDE 0.9 % IV SOLN
INTRAVENOUS | Status: DC | PRN
  Administered 2019-05-20: 60 mL

## 2019-05-20 MED ORDER — FENTANYL CITRATE (PF) 100 MCG/2ML IJ SOLN
INTRAMUSCULAR | Status: DC | PRN
  Administered 2019-05-20: 50 ug via INTRAVENOUS

## 2019-05-20 MED ORDER — GABAPENTIN 300 MG PO CAPS
ORAL_CAPSULE | ORAL | Status: AC
  Administered 2019-05-20: 300 mg via ORAL
  Filled 2019-05-20: qty 1

## 2019-05-20 MED ORDER — PHENYLEPHRINE HCL (PRESSORS) 10 MG/ML IV SOLN
INTRAVENOUS | Status: AC
  Filled 2019-05-20: qty 1

## 2019-05-20 MED ORDER — PHENYLEPHRINE HCL (PRESSORS) 10 MG/ML IV SOLN
INTRAVENOUS | Status: DC | PRN
  Administered 2019-05-20 (×4): 100 ug via INTRAVENOUS

## 2019-05-20 MED ORDER — SODIUM CHLORIDE 0.9 % IV SOLN: INTRAVENOUS | Status: DC

## 2019-05-20 MED ORDER — ACETAMINOPHEN 500 MG PO TABS
1000.0000 mg | ORAL_TABLET | Freq: Four times a day (QID) | ORAL | Status: AC
  Administered 2019-05-20 – 2019-05-21 (×4): 1000 mg via ORAL
  Filled 2019-05-20 (×4): qty 2

## 2019-05-20 MED ORDER — SODIUM CHLORIDE FLUSH 0.9 % IV SOLN
INTRAVENOUS | Status: AC
  Filled 2019-05-20: qty 40

## 2019-05-20 MED ORDER — CEFAZOLIN SODIUM-DEXTROSE 1-4 GM/50ML-% IV SOLN
1.0000 g | Freq: Four times a day (QID) | INTRAVENOUS | Status: AC
  Administered 2019-05-20 – 2019-05-21 (×2): 1 g via INTRAVENOUS
  Filled 2019-05-20 (×2): qty 50

## 2019-05-20 MED ORDER — ACETAMINOPHEN 500 MG PO TABS: 1000.0000 mg | ORAL_TABLET | ORAL | Status: AC

## 2019-05-20 MED ORDER — CHLORHEXIDINE GLUCONATE CLOTH 2 % EX PADS
6.0000 | MEDICATED_PAD | Freq: Once | CUTANEOUS | Status: AC
  Administered 2019-05-19: 6 via TOPICAL

## 2019-05-20 MED ORDER — BUPIVACAINE-EPINEPHRINE 0.25% -1:200000 IJ SOLN
INTRAMUSCULAR | Status: DC | PRN
  Administered 2019-05-20: 60 mL

## 2019-05-20 MED ORDER — CLINDAMYCIN PHOSPHATE 600 MG/50ML IV SOLN
INTRAVENOUS | Status: AC
  Filled 2019-05-20: qty 50

## 2019-05-20 MED ORDER — OXYCODONE HCL 5 MG PO TABS
5.0000 mg | ORAL_TABLET | ORAL | Status: DC | PRN
  Administered 2019-05-20 – 2019-05-21 (×2): 10 mg via ORAL
  Administered 2019-05-21: 10:00:00 5 mg via ORAL
  Filled 2019-05-20: qty 1
  Filled 2019-05-20: qty 2

## 2019-05-20 MED ORDER — LACTATED RINGERS IV SOLN: INTRAVENOUS | Status: DC

## 2019-05-20 SURGICAL SUPPLY — 66 items
AUGMENT PFC SIG RP SZ2.5 12.5M (Knees) ×1 IMPLANT
BLADE SAW 90X13X1.19 OSCILLAT (BLADE) ×2 IMPLANT
BLADE SAW 90X25X1.19 OSCILLAT (BLADE) ×2 IMPLANT
CANISTER SUCT 3000ML PPV (MISCELLANEOUS) ×2 IMPLANT
CEMENT HV SMART SET (Cement) ×4 IMPLANT
CEMENT TIBIA MBT SIZE 2.5 (Knees) ×1 IMPLANT
CNTNR SPEC 2.5X3XGRAD LEK (MISCELLANEOUS) ×1
CONT SPEC 4OZ STER OR WHT (MISCELLANEOUS) ×1
CONTAINER SPEC 2.5X3XGRAD LEK (MISCELLANEOUS) ×1 IMPLANT
COOLER POLAR GLACIER W/PUMP (MISCELLANEOUS) ×2 IMPLANT
COVER WAND RF STERILE (DRAPES) ×2 IMPLANT
CUFF TOURN SGL QUICK 24 (TOURNIQUET CUFF) ×1
CUFF TOURN SGL QUICK 30 (TOURNIQUET CUFF)
CUFF TRNQT CYL 24X4X16.5-23 (TOURNIQUET CUFF) ×1 IMPLANT
CUFF TRNQT CYL 30X4X21-28X (TOURNIQUET CUFF) IMPLANT
DRAPE 3/4 80X56 (DRAPES) ×4 IMPLANT
DRAPE IMP U-DRAPE 54X76 (DRAPES) ×4 IMPLANT
DRAPE INCISE IOBAN 66X60 STRL (DRAPES) ×2 IMPLANT
DRAPE SURG 17X11 SM STRL (DRAPES) ×4 IMPLANT
DRSG OPSITE POSTOP 4X12 (GAUZE/BANDAGES/DRESSINGS) ×2 IMPLANT
DRSG OPSITE POSTOP 4X14 (GAUZE/BANDAGES/DRESSINGS) ×2 IMPLANT
DURAPREP 26ML APPLICATOR (WOUND CARE) ×6 IMPLANT
ELECT REM PT RETURN 9FT ADLT (ELECTROSURGICAL) ×2
ELECTRODE REM PT RTRN 9FT ADLT (ELECTROSURGICAL) ×1 IMPLANT
FEMUR SIGMA PS SZ 2.5 R (Femur) ×2 IMPLANT
GAUZE SPONGE 4X4 12PLY STRL (GAUZE/BANDAGES/DRESSINGS) ×2 IMPLANT
GLOVE BIOGEL PI IND STRL 9 (GLOVE) ×2 IMPLANT
GLOVE BIOGEL PI INDICATOR 9 (GLOVE) ×2
GLOVE SURG 9.0 ORTHO LTXF (GLOVE) ×4 IMPLANT
GOWN STRL REUS TWL 2XL XL LVL4 (GOWN DISPOSABLE) ×2 IMPLANT
GOWN STRL REUS W/ TWL LRG LVL3 (GOWN DISPOSABLE) ×1 IMPLANT
GOWN STRL REUS W/ TWL LRG LVL4 (GOWN DISPOSABLE) ×1 IMPLANT
GOWN STRL REUS W/TWL LRG LVL3 (GOWN DISPOSABLE) ×1
GOWN STRL REUS W/TWL LRG LVL4 (GOWN DISPOSABLE) ×1
HOLDER FOLEY CATH W/STRAP (MISCELLANEOUS) ×2 IMPLANT
IMMBOLIZER KNEE 19 BLUE UNIV (SOFTGOODS) IMPLANT
KIT TURNOVER KIT A (KITS) ×2 IMPLANT
MANIFOLD NEPTUNE II (INSTRUMENTS) ×2 IMPLANT
NDL SAFETY ECLIPSE 18X1.5 (NEEDLE) ×1 IMPLANT
NEEDLE HYPO 18GX1.5 SHARP (NEEDLE) ×1
NEEDLE HYPO 22GX1.5 SAFETY (NEEDLE) ×2 IMPLANT
NEEDLE SPNL 20GX3.5 QUINCKE YW (NEEDLE) ×2 IMPLANT
NS IRRIG 1000ML POUR BTL (IV SOLUTION) ×2 IMPLANT
PACK TOTAL KNEE (MISCELLANEOUS) ×2 IMPLANT
PAD WRAPON POLAR KNEE (MISCELLANEOUS) ×1 IMPLANT
PATELLA DOME PFC 35MM (Knees) ×2 IMPLANT
PENCIL SMOKE ULTRAEVAC 22 CON (MISCELLANEOUS) ×2 IMPLANT
PFC SIGMA RP STB SZ 2.5 12.5M (Knees) ×2 IMPLANT
PULSAVAC PLUS IRRIG FAN TIP (DISPOSABLE) ×2
SOL .9 NS 3000ML IRR  AL (IV SOLUTION) ×1
SOL .9 NS 3000ML IRR UROMATIC (IV SOLUTION) ×1 IMPLANT
SPONGE LAP 18X18 RF (DISPOSABLE) IMPLANT
STAPLER SKIN PROX 35W (STAPLE) ×2 IMPLANT
SUCTION FRAZIER HANDLE 10FR (MISCELLANEOUS) ×1
SUCTION TUBE FRAZIER 10FR DISP (MISCELLANEOUS) ×1 IMPLANT
SUT ETHIBOND NAB CT1 #1 30IN (SUTURE) ×4 IMPLANT
SUT VIC AB 0 CT1 36 (SUTURE) ×2 IMPLANT
SUT VIC AB 2-0 CT1 (SUTURE) ×4 IMPLANT
SYR 20ML LL LF (SYRINGE) ×2 IMPLANT
SYR 30ML LL (SYRINGE) ×4 IMPLANT
TIBIA MBT CEMENT SIZE 2.5 (Knees) ×2 IMPLANT
TIP FAN IRRIG PULSAVAC PLUS (DISPOSABLE) ×1 IMPLANT
TOWER CARTRIDGE SMART MIX (DISPOSABLE) ×2 IMPLANT
TRAY FOLEY MTR SLVR 16FR STAT (SET/KITS/TRAYS/PACK) ×2 IMPLANT
TUBE SUCT KAM VAC (TUBING) ×2 IMPLANT
WRAPON POLAR PAD KNEE (MISCELLANEOUS) ×2

## 2019-05-20 NOTE — Evaluation (Signed)
Physical Therapy Evaluation Patient Details Name: Jordan Palmer MRN: [id number] DOB: 08-Dec-1950 Today's Date: 05/20/2019   History of Present Illness  Pt admitted for R TKR. HIstory includes HTN, HLD and vertigo.  Clinical Impression  Pt is a pleasant 69 year old female who was admitted for R TKR. Pt performs bed mobility with cga and unable to further transfer/ambulate at this time due to severe pain. RN notified, however pt unable to receive additional pain meds at this time. Pt demonstrates deficits with strength/pain/mobility. Taken KI off for mobility and ROM. Limited progress with AAROM. Would benefit from skilled PT to address above deficits and promote optimal return to PLOF. Recommend transition to [facility] upon discharge from acute hospitalization.     Follow Up Recommendations Home health PT;Supervision/Assistance - 24 hour    Equipment Recommendations  Rolling walker with 5" wheels    Recommendations for Other Services       Precautions / Restrictions Precautions Precautions: Knee;Fall Precaution Booklet Issued: No Required Braces or Orthoses: Knee Immobilizer - Right Knee Immobilizer - Right: (on except with PT) Restrictions Weight Bearing Restrictions: Yes RLE Weight Bearing: Weight bearing as tolerated      Mobility  Bed Mobility Overal bed mobility: Needs Assistance Bed Mobility: Supine to Sit     Supine to sit: Min guard     General bed mobility comments: very slow transition with R LE across bed and up on side of recliner. Once seated, able to sit with supervision, however severe pain. Unable to further perform OOB mobility. Able to maintain seated position for a few minutes then request to return back supine  Transfers                 General transfer comment: unable to perform  Ambulation/Gait                Stairs            Wheelchair Mobility    Modified Rankin (Stroke Patients Only)       Balance Overall balance  assessment: Needs assistance Sitting-balance support: Feet supported;Bilateral upper extremity supported Sitting balance-Leahy Scale: Good                                       Pertinent Vitals/Pain Pain Assessment: 0-10 Pain Score: 9  Pain Location: R knee Pain Descriptors / Indicators: Operative site guarding Pain Intervention(s): Limited activity within patient's tolerance;Ice applied;Repositioned;Patient requesting pain meds-RN notified;Monitored during session    Home Living Family/patient expects to be discharged to:: Private residence Living Arrangements: Spouse/significant other Available Help at Discharge: Family(spouse available 24/7 for the next week) Type of Home: House Home Access: Stairs to enter Entrance Stairs-Rails: Left;Right(can't reach both, typically uses L hand on SPC and R Rail) Technical brewer of Steps: 3 Home Layout: One level Home Equipment: Cane - single point      Prior Function Level of Independence: Independent with assistive device(s)         Comments: was using SPC prior to admission. Reports no falls     Hand Dominance        Extremity/Trunk Assessment   Upper Extremity Assessment Upper Extremity Assessment: Overall WFL for tasks assessed    Lower Extremity Assessment Lower Extremity Assessment: Generalized weakness(R LE grossly 3/5 pain limited; L LE grossly 4+/5)       Communication   Communication: No difficulties  Cognition Arousal/Alertness: Awake/alert  Behavior During Therapy: WFL for tasks assessed/performed Overall Cognitive Status: Within Functional Limits for tasks assessed                                 General Comments: very pleasant and willing to attempt therapy, severe pain with movement.      General Comments      Exercises Total Joint Exercises Goniometric ROM: R knee AAROM: 7-58 degrees and very pain limited Other Exercises Other Exercises: Supine/seated ther-ex  performed on R LE including AP, quad sets, SLRs, and LAQ. All ther-ex performed x 5 reps but had to be discontinued due to severe pain.   Assessment/Plan    PT Assessment Patient needs continued PT services  PT Problem List Decreased strength;Decreased balance;Decreased mobility;Pain       PT Treatment Interventions DME instruction;Gait training;Stair training;Therapeutic exercise;Balance training    PT Goals (Current goals can be found in the Care Plan section)  Acute Rehab PT Goals Patient Stated Goal: to go home PT Goal Formulation: With patient Time For Goal Achievement: 06/03/19 Potential to Achieve Goals: Good    Frequency BID   Barriers to discharge        Co-evaluation               AM-PAC PT "6 Clicks" Mobility  Outcome Measure Help needed turning from your back to your side while in a flat bed without using bedrails?: A Little Help needed moving from lying on your back to sitting on the side of a flat bed without using bedrails?: A Little Help needed moving to and from a bed to a chair (including a wheelchair)?: A Lot Help needed standing up from a chair using your arms (e.g., wheelchair or bedside chair)?: A Lot Help needed to walk in hospital room?: A Lot Help needed climbing 3-5 steps with a railing? : A Lot 6 Click Score: 14    End of Session Equipment Utilized During Treatment: Gait belt Activity Tolerance: Patient tolerated treatment well Patient left: in bed;with bed alarm set;with SCD's reapplied Nurse Communication: Mobility status PT Visit Diagnosis: Muscle weakness (generalized) (M62.81);Difficulty in walking, not elsewhere classified (R26.2);Pain Pain - Right/Left: Right Pain - part of body: Knee    Time: [id number] PT Time Calculation (min) (ACUTE ONLY): 30 min   Charges:   PT Evaluation $PT Eval Moderate Complexity: 1 Mod PT Treatments $Therapeutic Exercise: 8-22 mins        Greggory Stallion, PT,  DPT [phone]   Nysia Dell 05/20/2019, 3:44 PM

## 2019-05-20 NOTE — Transfer of Care (Signed)
Immediate Anesthesia Transfer of Care Note  Patient: Jordan Palmer  Procedure(s) Performed: TOTAL KNEE ARTHROPLASTY (Right Knee)  Patient Location: PACU  Anesthesia Type:Spinal  Level of Consciousness: drowsy and patient cooperative  Airway & Oxygen Therapy: Patient Spontanous Breathing  Post-op Assessment: Report given to RN and Post -op Vital signs reviewed and stable  Post vital signs: Reviewed and stable  Last Vitals:  Vitals Value Taken Time  BP 91/48 05/20/19 1051  Temp    Pulse 82 05/20/19 1051  Resp 15 05/20/19 1051  SpO2 99 % 05/20/19 1051  Vitals shown include unvalidated device data.  Last Pain:  Vitals:   05/20/19 0642  TempSrc: Tympanic  PainSc: 0-No pain      Patients Stated Pain Goal: 0 ([id number])  Complications: No apparent anesthesia complications

## 2019-05-20 NOTE — Plan of Care (Signed)
  Problem: Education: Goal: Knowledge of General Education information will improve Description: Including pain rating scale, medication(s)/side effects and non-pharmacologic comfort measures Outcome: Progressing   Problem: Clinical Measurements: Goal: Will remain free from infection Outcome: Progressing Note: No s/s of infection noted Goal: Respiratory complications will improve Outcome: Progressing Goal: Cardiovascular complication will be avoided Outcome: Progressing

## 2019-05-20 NOTE — Anesthesia Preprocedure Evaluation (Signed)
Anesthesia Evaluation  Patient identified by MRN, date of birth, ID band Patient awake    Reviewed: Allergy & Precautions, NPO status , Patient's Chart, lab work & pertinent test results  History of Anesthesia Complications Negative for: history of anesthetic complications  Airway Mallampati: III       Dental   Pulmonary sleep apnea and Continuous Positive Airway Pressure Ventilation , neg COPD, Not current smoker,           Cardiovascular hypertension, Pt. on medications (-) Past MI and (-) CHF (-) dysrhythmias (-) Valvular Problems/Murmurs     Neuro/Psych neg Seizures negative neurological ROS  negative psych ROS   GI/Hepatic Neg liver ROS, neg GERD  ,  Endo/Other  neg diabetesHypothyroidism (s/p partial thyroidectomy)   Renal/GU negative Renal ROS  negative genitourinary   Musculoskeletal  (+) Arthritis , Osteoarthritis,    Abdominal   Peds negative pediatric ROS (+)  Hematology   Anesthesia Other Findings   Reproductive/Obstetrics                             Anesthesia Physical  Anesthesia Plan  ASA: II  Anesthesia Plan: Spinal   Post-op Pain Management:    Induction: Intravenous  PONV Risk Score and Plan:   Airway Management Planned: Nasal Cannula  Additional Equipment:   Intra-op Plan:   Post-operative Plan:   Informed Consent: I have reviewed the patients History and Physical, chart, labs and discussed the procedure including the risks, benefits and alternatives for the proposed anesthesia with the patient or authorized representative who has indicated his/her understanding and acceptance.       Plan Discussed with: CRNA and Surgeon  Anesthesia Plan Comments:         Anesthesia Quick Evaluation

## 2019-05-20 NOTE — Op Note (Signed)
DATE OF SURGERY:  05/20/2019 TIME: 10:51 AM  PATIENT NAME:  Jordan Palmer   AGE: 69 y.o.    PRE-OPERATIVE DIAGNOSIS:  Unilateral Primary Osteoarthritis, Right knee  POST-OPERATIVE DIAGNOSIS:  Same  PROCEDURE:  Procedure(s): RIGHT TOTAL KNEE ARTHROPLASTY  SURGEON:  Thornton Park, MD   ASSISTANT:  Tessa Lerner, PA  OPERATIVE IMPLANTS: Depuy PFC Sigma, Posterior Stabilized Femural component size 2.5, Tibia size rotating platform component size 2.5, Patella polyethylene 3-peg oval button size 35, with a 24mm, size 2.5 polyethylene insert.  EBL:  50  TOURNIQUET TIME:  118 minutes  PREOPERATIVE INDICATIONS:  Jordan Palmer is an 69 y.o. female who has a diagnosis of  Unilateral Primary Osteoarthritis, Right knee and elected for a right total knee arthroplasty after failing nonoperative treatment.  The patient's knee pain significantly impacts their activity of daily living including the ability to ambulate.  Radiographs have demonstrated tricompartmental osteoarthritis joint space narrowing, osteophytes, and subchondral sclerosis.  Previous right knee arthroscopy has demonstrated advanced tricompartmental osteoarthritis as well.  The risks, benefits, and alternatives were discussed at length including but not limited to the risks of infection, bleeding, nerve or blood vessel injury, knee stiffness, fracture, dislocation, loosening or failure of the hardware and the need for further surgery. Medical risks include but not limited to DVT and pulmonary embolism, myocardial infarction, stroke, pneumonia, respiratory failure and death. I discussed these risks with the patient in my office prior to the date of surgery. They understood these risks and were willing to proceed.  OPERATIVE FINDINGS AND UNIQUE ASPECTS OF THE CASE: Tricompartmental osteoarthritis  OPERATIVE DESCRIPTION:  The patient was brought to the operative room and placed in a supine position after undergoing placement of a  spinal anesthetic.  A Foley catheter was placed.  IV antibiotics were given. Patient received Ancef 2 g IV and clindamycin 600 mg IV.  Patient also received tranexamic acid prior to the incision.  The lower extremity was prepped and draped in the usual sterile fashion.  A time out was performed to verify the patient's name, date of birth, medical record number, correct site of surgery and correct procedure to be performed. The timeout was also used to confirm the patient received antibiotics and that appropriate instruments, implants and radiographs studies were available in the room.  The leg was elevated and exsanguinated with an Esmarch and the tourniquet was inflated to 275 mmHg for 118 minutes..  A midline incision was made over the right knee. Full-thickness skin flaps were developed. A medial parapatellar arthrotomy was then made and the patella everted and the knee was brought into 90 of flexion. Hoffa's fat pad along with the cruciate ligaments and medial and lateral menisci were resected.   The distal femoral intramedullary canal was opened with a drill and the intramedullary distal femoral cutting jig was inserted into the femoral canal pinned into position. It was set at 5 degrees resecting 10 mm off the distal femur.  Care was taken to protect the collateral ligaments during distal femoral resection.  The distal femoral resection was performed with an oscillating saw. The femoral cutting guide was then removed.  The extramedullary tibial cutting guide was then placed using the anterior tibial crest and second ray of the foot as a references.  The tibial cutting guide was adjusted to allow for appropriate posterior slope.  The tibial cutting block was pinned into position. The slotted stylus was used to measure the proximal tibial resection of 10 mm off the high  lateral side.  The tibial long rod alignment guide was then used to confirm position of the cutting block. A third cross pin through the  tibial cutting block was then drilled into position to allow for rotational stability. Care was taken during the tibial resection to protect the medial and collateral ligaments.  The resected tibial bone was removed along with the posterior horns of the menisci.  The PCL was sacrificed.  Extension gap was measured with a spacer block and alignment and extension was confirmed using a long alignment rod.  The attention was then turned back to the femur. The posterior referencing distal femoral sizing guide was applied to the distal femur.  The femur was sized to be a size 2.5. Rotation of the referencing guide was checked with the epicondylar axis and Whitesides line. Then the 4-in-1 cutting jig was then applied to the distal femur. A stylus was used to confirm that the anterior femur would not be notched.   Then the anterior, posterior and chamfer femoral cuts were then made with an oscillating saw.  The flexion gap was then measured with a flexion spacer block and long alignment rod and was found to be symmetric with the extension gap and perpendicular to mechanical axis of the tibia.  The distal femoral preparation was completed by performing the posterior stabilized box cut using the cutting block. The entry site for the intramedullary femoral guide was filled with autologous bone graft from bone previously resected earlier in the case.  The proximal tibia plateau was then sized with trial trays. The best coverage was achieved with a size 2.5. This tibial tray was then pinned into position. The proximal tibia was then prepared with the reamer and keel punch.  After tibial preparation was completed, all trial components were inserted with polyethylene trials.  The knee was found to have excellent balance and full motion with a size 10 mm tibial polyethylene insert..    The attention was then turned to preparation of the patella. The thickness of the patella was measured with a caliper, the diameter measured  with the patella templates.  The patella resection was then made with an oscillating saw using the patella cutting guide.  3 peg holes for the patella component were then drilled. The trial patella was then placed. Knee was taken through a full range of motion and deemed to be stable with the trial components. All trial components were then removed. The knee capsule was then injected with Exparel.  The knee joint capsule was injected with a mixture of quarter percent Marcaine, Toradol and morphine to assist with postoperative pain relief.  The joint was copiously irrigated with pulse lavage.  The final total knee arthroplasty components were then cemented into place with a 10 mm trial polyethylene insert and all excess methylmethacrylate was removed.  The joint was again copiously irrigated. After the cement had hardened the knee was again taken through a full range of motion. It was felt to be most stable with the 12.5 mm tibial polyethylene insert. The actual tibial polyethylene insert was then placed.   The knee was taken through a range of motion and the patella tracked well and the knee was again irrigated copiously.    The medial arthrotomy was closed with #1 Ethibond. The subcutaneous tissue closed with 0 and 2-0 vicryl, and skin approximated with staples.  A dry sterile and compressive dressing was applied.  A Polar Care was applied to the operative knee along with a knee  immobilizer.  The patient was awakened and brought to the PACU in stable and satisfactory condition.  All sharp, lap and instrument counts were correct at the conclusion the case. I spoke with the patient's husband in the postop consultation room to let him know the case had been performed without complication and the patient was stable in recovery room.

## 2019-05-20 NOTE — H&P (Signed)
PREOPERATIVE H&P  Chief Complaint: Unilateral Primary Osteoarthritis, Right knee  HPI: Jordan Palmer is a 69 y.o. female who presents for preoperative history and physical with a diagnosis of Unilateral Primary Osteoarthritis, Right knee. Symptoms of pain, swelling and limited ROM are significantly impairing activities of daily living.  Patient's pain is limiting her ability to ambulate.  Patient has failed non-operative management including injections, PT and NSAIDs.  She has also not sprains pain relief after arthroscopic partial medial meniscectomy.  At the time of arthroscopy it was confirmed the patient had diffuse tricompartmental chondromalacia, most advanced in the medial and patellofemoral compartments.  Her right knee xrays demonstrate joint space narrowing, subchondral sclerosis and osteophytes.   Patient has elected to proceed with right total knee arthroplasty given her persistence of pain and limitation due to her pain.  Past Medical History:  Diagnosis Date  . Arthritis   . Hyperlipemia   . Hypertension   . Hypothyroidism   . Sleep apnea   . Vertigo    Past Surgical History:  Procedure Laterality Date  . ANTERIOR AND POSTERIOR REPAIR N/A 03/12/2017   Procedure: ANTERIOR (CYSTOCELE) AND POSTERIOR REPAIR (RECTOCELE);  Surgeon: Salvadore Dom, MD;  Location: [facility];  Service: Gynecology;  Laterality: N/A;  . CARPAL TUNNEL RELEASE Bilateral   . CHOLECYSTECTOMY    . CYSTOSCOPY N/A 03/12/2017   Procedure: CYSTOSCOPY;  Surgeon: Salvadore Dom, MD;  Location: [facility];  Service: Gynecology;  Laterality: N/A;  . EYE SURGERY    . KNEE ARTHROSCOPY Left   . KNEE ARTHROSCOPY WITH MEDIAL MENISECTOMY Right 02/13/2019   Procedure: KNEE ARTHROSCOPY WITH MEDIAL MENISECTOMY;  Surgeon: Thornton Park, MD;  Location: [facility];  Service: Orthopedics;  Laterality: Right;  . LACRIMAL DUCT PROBING W/ DACRYOPLASTY Bilateral   . SALPINGOOPHORECTOMY  Left 03/12/2017   Procedure: SALPINGECTOMY;  Surgeon: Salvadore Dom, MD;  Location: [facility];  Service: Gynecology;  Laterality: Left;  . SHOULDER ARTHROSCOPY WITH OPEN ROTATOR CUFF REPAIR Right 09/03/2014   Procedure: SHOULDER ARTHROSCOPY WITH OPEN ROTATOR CUFF REPAIR;  Surgeon: Thornton Park, MD;  Location: [facility];  Service: Orthopedics;  Laterality: Right;  . THYROIDECTOMY Right    hemi-thyroidectomy  . VAGINAL HYSTERECTOMY N/A 03/12/2017   Procedure: HYSTERECTOMY VAGINAL;  Surgeon: Salvadore Dom, MD;  Location: [facility];  Service: Gynecology;  Laterality: N/A;   Social History   Socioeconomic History  . Marital status: Married    Spouse name: Not on file  . Number of children: Not on file  . Years of education: Not on file  . Highest education level: Not on file  Occupational History  . Not on file  Tobacco Use  . Smoking status: Never Smoker  . Smokeless tobacco: Never Used  Substance and Sexual Activity  . Alcohol use: Not Currently    Alcohol/week: 3.0 - 4.0 standard drinks    Types: 3 - 4 Standard drinks or equivalent per week  . Drug use: No  . Sexual activity: Yes    Partners: Male    Birth control/protection: Post-menopausal, Surgical  Other Topics Concern  . Not on file  Social History Narrative  . Not on file   Social Determinants of Health   Financial Resource Strain:   . Difficulty of Paying Living Expenses:   Food Insecurity:   . Worried About Charity fundraiser in the Last Year:   . [place] in the Last Year:  Transportation Needs:   . Film/video editor (Medical):   Marland Kitchen Lack of Transportation (Non-Medical):   Physical Activity:   . Days of Exercise per Week:   . Minutes of Exercise per Session:   Stress:   . Feeling of Stress :   Social Connections:   . Frequency of Communication with Friends and Family:   . Frequency of Social Gatherings with Friends and Family:   . Attends Religious  Services:   . Active Member of Clubs or Organizations:   . Attends Archivist Meetings:   Marland Kitchen Marital Status:    Family History  Problem Relation Age of Onset  . Diabetes type II Father   . CAD Father   . Alzheimer's disease Father   . Alzheimer's disease Mother   . Breast cancer Neg Hx    Allergies  Allergen Reactions  . Codeine Nausea And Vomiting  . Doxycycline Photosensitivity   Prior to Admission medications   Medication Sig Start Date End Date Taking? Authorizing Provider  acetaminophen (TYLENOL) 500 MG tablet Take 1,000 mg by mouth every 8 (eight) hours as needed (pain).   Yes [provider]  conjugated estrogens (PREMARIN) vaginal cream 1/2 gram vaginally twice weekly Patient taking differently: Place 1 Applicatorful vaginally 2 (two) times a week.  01/13/19  Yes Salvadore Dom, MD  fexofenadine (ALLEGRA) 180 MG tablet Take 180 mg by mouth daily as needed for allergies.    Yes [provider]  fluticasone (FLONASE) 50 MCG/ACT nasal spray Place 1 spray into both nostrils daily.   Yes [provider]  levothyroxine (SYNTHROID) 88 MCG tablet Take 88 mcg by mouth daily before breakfast.   Yes [provider]  lisinopril-hydrochlorothiazide (PRINZIDE,ZESTORETIC) 20-25 MG per tablet Take 1 tablet by mouth daily.   Yes [provider]  meloxicam (MOBIC) 7.5 MG tablet Take 7.5 mg by mouth 2 (two) times daily. 04/20/19  Yes [provider]  oxyCODONE (OXY IR/ROXICODONE) 5 MG immediate release tablet Take 5 mg by mouth every 6 (six) hours as needed.  04/21/19  Yes [provider]  rosuvastatin (CRESTOR) 5 MG tablet Take 5 mg by mouth daily.  07/01/15  Yes [provider]  aspirin EC 325 MG tablet Take 1 tablet (325 mg total) by mouth daily. Patient not taking: Reported on 05/01/2019 02/13/19   Thornton Park, MD  HYDROcodone-acetaminophen [facility]) 5-325 MG tablet Take 1 tablet by mouth every 4 (four) hours  as needed for moderate pain. Patient not taking: Reported on 05/01/2019 02/13/19   Thornton Park, MD  ondansetron (ZOFRAN) 4 MG tablet Take 1 tablet (4 mg total) by mouth every 8 (eight) hours as needed for nausea or vomiting. Patient not taking: Reported on 05/01/2019 02/13/19   Thornton Park, MD     Positive ROS: All other systems have been reviewed and were otherwise negative with the exception of those mentioned in the HPI and as above.  Physical Exam: General: Alert, no acute distress Cardiovascular: Regular rate and rhythm, no murmurs rubs or gallops.  No pedal edema Respiratory: Clear to auscultation bilaterally, no wheezes rales or rhonchi. No cyanosis, no use of accessory musculature GI: No organomegaly, abdomen is soft and non-tender nondistended with positive bowel sounds. Skin: Skin intact, no lesions within the operative field. Neurologic: Sensation intact distally Psychiatric: Patient is competent for consent with normal mood and affect Lymphatic: No cervical lymphadenopathy  MUSCULOSKELETAL: RIGHT KNEE:  Skin intact.  Mild effusion.   No erythema or ecchymosis.  ROM 0-110 degrees.  NVI.  No ligamentously laxity.  + Patellofemoral crepitus.  + Medial and lateral joint line tenderness no significant angular deformity.  Assessment: Unilateral Primary Osteoarthritis, Right knee  Plan: Plan for Procedure(s): RIGHT TOTAL KNEE ARTHROPLASTY  I explained the details of the operation as well as the postoperative course with the patient and her husband who was at the bedside this morning.  A history and physical was performed at the bedside.  I also discussed the risks and benefits of surgery. They understand risks include but are not limited to infection, bleeding requiring blood transfusion, nerve or blood vessel injury, joint stiffness or loss of motion, persistent pain, weakness or instability, fracture, dislocation, loosening or hardware failure and the need for further  surgery. Medical risks include but are not limited to DVT and pulmonary embolism, myocardial infarction, stroke, pneumonia, respiratory failure and death. Patient understood these risks and wished to proceed.   Patient confirms that she has not had a history of stroke, DVT or pulmonary embolism or myocardial infarction and is therefore a candidate for tranexamic acid.  I reviewed the patient's laboratory studies in preparation for this case along with her x-rays.    Thornton Park, MD   05/20/2019 7:29 AM

## 2019-05-20 NOTE — Anesthesia Procedure Notes (Signed)
Spinal  Patient location during procedure: OR Staffing Performed: resident/CRNA  Anesthesiologist: Alvin Critchley, MD Resident/CRNA: Jonna Clark, CRNA Preanesthetic Checklist Completed: patient identified, IV checked, site marked, risks and benefits discussed, surgical consent, monitors and equipment checked, pre-op evaluation and timeout performed Spinal Block Patient position: sitting Prep: Betadine and DuraPrep Patient monitoring: heart rate, cardiac monitor, continuous pulse ox and blood pressure Approach: midline Location: L3-4 Injection technique: single-shot Needle Needle type: Sprotte and Whitacre  Needle gauge: 24 G Needle length: 9 cm Assessment Sensory level: T4 Additional Notes Negative paresthesia. Negative blood return. Positive free-flowing CSF. Expiration date of kit checked and confirmed. Patient tolerated procedure well, without complications.

## 2019-05-20 NOTE — Progress Notes (Signed)
  Subjective:  POST-OP CHECK: Patient had significant pain in the PACU postop once her spinal block wore off today.  Currently the patient reports right knee pain as moderate.  Patient denies shortness of breath, chest pain or abdominal pain.  Objective:   VITALS:   Vitals:   05/20/19 1538 05/20/19 1612 05/20/19 1714 05/20/19 1829  BP: 114/68 (!) 112/59 116/60 (!) 109/57  Pulse: 83 76 70 84  Resp:  17    Temp: (!) 97.4 F (36.3 C) 97.7 F (36.5 C) 97.7 F (36.5 C) 98.1 F (36.7 C)  TempSrc: Oral Oral Oral Oral  SpO2: 100% 100% 100% 100%  Weight:      Height:        PHYSICAL EXAM: Right lower extremity: Neurovascular intact Sensation intact distally Intact pulses distally Dorsiflexion/Plantar flexion intact Incision: dressing C/D/I No cellulitis present Compartment soft  LABS  Results for orders placed or performed during the hospital encounter of 05/20/19 (from the past 24 hour(s))  ABO/Rh     Status: None   Collection Time: 05/20/19  6:43 AM  Result Value Ref Range   ABO/RH(D)      A NEG Performed at [facility], [address], [place], [place] [address]     DG Knee Right Port  Result Date: 05/20/2019 CLINICAL DATA:  Primary osteoarthritis of the right knee. Status post total knee arthroplasty. EXAM: PORTABLE RIGHT KNEE - 1-2 VIEW COMPARISON:  None. FINDINGS: The components of the total knee prosthesis appear in excellent position. Postsurgical fluid and air to the expected degree in the soft tissues and joint. No fractures. IMPRESSION: Satisfactory appearance of the right knee after total knee replacement. Electronically Signed   By: Lorriane Shire M.D.   On: 05/20/2019 11:41    Assessment/Plan: Day of Surgery   Active Problems:   S/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, right  Have reviewed the postoperative x-ray which demonstrates the total knee arthroplasty components are well-positioned.  There is no fracture dislocation or other postop  complication.  Patient will receive 24 hours of postop antibiotics.  Foley catheter will be removed in the morning.  Labs will be drawn in the morning.  Patient will continue physical therapy tomorrow.  Lovenox will begin tomorrow for DVT prophylaxis.    Thornton Park , MD 05/20/2019, 7:15 PM

## 2019-05-20 NOTE — TOC Initial Note (Addendum)
Transition of Care [facility]) - Initial/Assessment Note    Patient Details  Name: Jordan Palmer MRN: [id number] Date of Birth: 08-Feb-1950  Transition of [facility]) CM/SW Contact:    Elease Hashimoto, LCSW Phone Number: 05/20/2019, 3:51 PM  Clinical Narrative:  Met briefly with pt who lives with her husband. She was independent prior to admission and husband plans to take off one week to assist with her transition home. Have ordered rw and 3 in1 from [employer] and am looking for a home health agency to take the referral due to [facility] coverage. Pt is not feeling well and nauseous from surgery. She was driving prior to admission and has a PCP. Will continue to work on [facility] agency to take pt. See tomorrow to see if better.                  4:08 Pm [facility] will take pt's referral.  Expected Discharge Plan: [place] Barriers to Discharge: Continued Medical Work up   Patient Goals and CMS Choice Patient states their goals for this hospitalization and ongoing recovery are:: My husband plans to take one week off to be there for me CMS Medicare.gov Compare Post Acute Care list provided to:: Patient Choice offered to / list presented to : Patient  Expected Discharge Plan and Services Expected Discharge Plan: [place] In-house Referral: Clinical Social Work   Post Acute Care Choice: Home Health, Durable Medical Equipment                   DME Arranged: 3-N-1, Walker rolling DME Agency: [facility] Date DME Agency Contacted: 05/20/19 Time DME Agency Contacted: [phone] Representative spoke with at DME Agency: brad HH Arranged: PT          Prior Living Arrangements/Services   Lives with:: Spouse Patient language and need for interpreter reviewed:: No Do you feel safe going back to the place where you live?: Yes      Need for Family Participation in Patient Care: Yes (Comment) Care giver support system in place?: Yes (comment) Current home services:  DME(cane) Criminal Activity/Legal Involvement Pertinent to Current Situation/Hospitalization: No - Comment as needed  Activities of Daily Living Home Assistive Devices/Equipment: CPAP, Contact lenses, Cane (specify quad or straight), Eyeglasses ADL Screening (condition at time of admission) Patient's cognitive ability adequate to safely complete daily activities?: Yes Is the patient deaf or have difficulty hearing?: No Does the patient have difficulty seeing, even when wearing glasses/contacts?: No Does the patient have difficulty concentrating, remembering, or making decisions?: No Patient able to express need for assistance with ADLs?: Yes Does the patient have difficulty dressing or bathing?: No Independently performs ADLs?: Yes (appropriate for developmental age) Does the patient have difficulty walking or climbing stairs?: Yes Weakness of Legs: Right Weakness of Arms/Hands: None  Permission Sought/Granted Permission sought to share information with : Family Supports, Customer service manager    Share Information with NAME: Nicki Reaper     Permission granted to share info w Relationship: husband     Emotional Assessment Appearance:: Appears stated age Attitude/Demeanor/Rapport: Engaged, Gracious Affect (typically observed): Adaptable, Accepting Orientation: : Oriented to Self, Oriented to Place, Oriented to  Time, Oriented to Situation Alcohol / Substance Use: Never Used Psych Involvement: No (comment)  Admission diagnosis:  S/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, right [Z96.651] Patient Active Problem List   Diagnosis Date Noted  . S/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, right 05/20/2019  .  H/O total vaginal hysterectomy 03/12/2017  . Essential hypertension 03/08/2017  . Mixed dyslipidemia 03/08/2017  . Abnormal EKG 03/08/2017  . Preoperative cardiovascular examination 03/08/2017  . Allergic state 12/11/2013  . High cholesterol 12/11/2013  . History of  depression 12/11/2013  . Hypothyroidism 12/11/2013  . Sleep apnea 12/11/2013   PCP:  Baxter Hire, MD Pharmacy:   [id number] [place], [place] [address]Phone: [phone]: [phone]    Social Determinants of Health (SDOH) Interventions    Readmission Risk Interventions No flowsheet data found.

## 2019-05-21 LAB — CBC
HCT: 28.5 % — ABNORMAL LOW (ref 36.0–46.0)
Hemoglobin: 9.6 g/dL — ABNORMAL LOW (ref 12.0–15.0)
MCH: 31.9 pg (ref 26.0–34.0)
MCHC: 33.7 g/dL (ref 30.0–36.0)
MCV: 94.7 fL (ref 80.0–100.0)
Platelets: 224 10*3/uL (ref 150–400)
RBC: 3.01 MIL/uL — ABNORMAL LOW (ref 3.87–5.11)
RDW: 12.7 % (ref 11.5–15.5)
WBC: 6.5 10*3/uL (ref 4.0–10.5)
nRBC: 0 % (ref 0.0–0.2)

## 2019-05-21 LAB — BASIC METABOLIC PANEL
Anion gap: 8 (ref 5–15)
BUN: 23 mg/dL (ref 8–23)
CO2: 23 mmol/L (ref 22–32)
Calcium: 8.3 mg/dL — ABNORMAL LOW (ref 8.9–10.3)
Chloride: 106 mmol/L (ref 98–111)
Creatinine, Ser: 0.88 mg/dL (ref 0.44–1.00)
GFR calc Af Amer: >60 mL/min (ref >60)
GFR calc non Af Amer: >60 mL/min (ref >60)
Glucose, Bld: 104 mg/dL — ABNORMAL HIGH (ref 70–99)
Potassium: 3.7 mmol/L (ref 3.5–5.1)
Sodium: 137 mmol/L (ref 135–145)

## 2019-05-21 NOTE — Progress Notes (Signed)
Physical Therapy Treatment Patient Details Name: Jordan Palmer MRN: [id number] DOB: 08-13-1950 Today's Date: 05/21/2019    History of Present Illness Pt admitted for R TKR. HIstory includes HTN, HLD and vertigo.    PT Comments    Pt ready for session.  Pain remains but she is able to work through it for ex below, min assist bed mobility and gait to doorway and a stop at bathroom which she is unable to void or BM at this time.  Pt on 1 lpm of O2 upon arrival.  Sats remained >92% at all times for mobility.  Discussed with RN and O2 left off at end of session.   Follow Up Recommendations  Home health PT;Supervision/Assistance - 24 hour     Equipment Recommendations  Rolling walker with 5" wheels    Recommendations for Other Services       Precautions / Restrictions Precautions Precautions: Knee;Fall Precaution Booklet Issued: No Required Braces or Orthoses: Knee Immobilizer - Right Knee Immobilizer - Right: (on except with PT) Restrictions Weight Bearing Restrictions: Yes RLE Weight Bearing: Weight bearing as tolerated    Mobility  Bed Mobility Overal bed mobility: Needs Assistance Bed Mobility: Supine to Sit     Supine to sit: Min guard        Transfers Overall transfer level: Needs assistance Equipment used: Rolling walker (2 wheeled) Transfers: Sit to/from Stand Sit to Stand: Min assist            Ambulation/Gait Ambulation/Gait assistance: Min guard;Min Web designer (Feet): 10 Feet Assistive device: Rolling walker (2 wheeled)   Gait velocity: decreased   General Gait Details: to door and back with stop in bathroom - unable to void or BM at this time   Stairs             Wheelchair Mobility    Modified Rankin (Stroke Patients Only)       Balance Overall balance assessment: Needs assistance Sitting-balance support: Feet supported;Bilateral upper extremity supported Sitting balance-Leahy Scale: Good     Standing balance  support: Bilateral upper extremity supported Standing balance-Leahy Scale: Fair                              Cognition Arousal/Alertness: Awake/alert Behavior During Therapy: WFL for tasks assessed/performed Overall Cognitive Status: Within Functional Limits for tasks assessed                                 General Comments: very pleasant and willing to attempt therapy, severe pain with movement.      Exercises Total Joint Exercises Ankle Circles/Pumps: 10 reps;Both Quad Sets: Both;10 reps Hip ABduction/ADduction: Right;10 reps Straight Leg Raises: Right;10 reps Long Arc Quad: Right;10 reps Knee Flexion: Right;5 reps Goniometric ROM: 0-58  - extension improved today but flexion limited by pain and bulky post op dressing Marching in Standing: Both;10 reps;Standing    General Comments        Pertinent Vitals/Pain Pain Assessment: Faces Faces Pain Scale: Hurts even more Pain Location: R knee Pain Descriptors / Indicators: Operative site guarding Pain Intervention(s): Limited activity within patient's tolerance;Monitored during session;Premedicated before session;Repositioned;Ice applied    Home Living                      Prior Function            PT Goals (  current goals can now be found in the care plan section) Progress towards PT goals: Progressing toward goals    Frequency    BID      PT Plan Current plan remains appropriate    Co-evaluation              AM-PAC PT "6 Clicks" Mobility   Outcome Measure  Help needed turning from your back to your side while in a flat bed without using bedrails?: A Little Help needed moving from lying on your back to sitting on the side of a flat bed without using bedrails?: A Little Help needed moving to and from a bed to a chair (including a wheelchair)?: A Little Help needed standing up from a chair using your arms (e.g., wheelchair or bedside chair)?: A Little Help needed to  walk in hospital room?: A Little Help needed climbing 3-5 steps with a railing? : A Lot 6 Click Score: 17    End of Session Equipment Utilized During Treatment: Gait belt Activity Tolerance: Patient tolerated treatment well Patient left: in chair;with call bell/phone within reach;with chair alarm set Nurse Communication: Mobility status Pain - Right/Left: Right Pain - part of body: Knee     Time: [id number] PT Time Calculation (min) (ACUTE ONLY): 23 min  Charges:  $Gait Training: 8-22 mins $Therapeutic Exercise: 8-22 mins                    Chesley Noon, PTA 05/21/19, 10:29 AM

## 2019-05-21 NOTE — Progress Notes (Signed)
Physical Therapy Treatment Patient Details Name: Jordan Palmer MRN: [id number] DOB: 10-17-1950 Today's Date: 05/21/2019    History of Present Illness Pt is a 69 year old female s/p R TKR.  Pt's PMH includes HTN, HLD, vertigo, and osteoarthritis.    PT Comments    Pt in chair, increasing pain, ready to get back to bed.  Stood and transferred with min a x 1.  RN in to give pain medications.  Primary barrier is pain control.  Once pain is controlled anticipate good progression with therapy and ability to discharge home.   Follow Up Recommendations  Home health PT;Supervision/Assistance - 24 hour     Equipment Recommendations  Rolling walker with 5" wheels    Recommendations for Other Services       Precautions / Restrictions Precautions Precautions: Knee;Fall Precaution Booklet Issued: No Required Braces or Orthoses: Knee Immobilizer - Right Knee Immobilizer - Right: (on except with PT) Restrictions Weight Bearing Restrictions: Yes RLE Weight Bearing: Weight bearing as tolerated    Mobility  Bed Mobility Overal bed mobility: Needs Assistance Bed Mobility: Sit to Supine     Supine to sit: Min guard Sit to supine: Min assist      Transfers Overall transfer level: Needs assistance Equipment used: Rolling walker (2 wheeled) Transfers: Sit to/from Stand Sit to Stand: Min assist         General transfer comment: Per PT report  Ambulation/Gait Ambulation/Gait assistance: Min guard;Min Web designer (Feet): 3 Feet Assistive device: Rolling walker (2 wheeled) Gait Pattern/deviations: Step-to pattern Gait velocity: decreased   General Gait Details: to door and back with stop in bathroom - unable to void or BM at this time   Stairs             Wheelchair Mobility    Modified Rankin (Stroke Patients Only)       Balance Overall balance assessment: Needs assistance Sitting-balance support: Feet supported Sitting balance-Leahy Scale: Good      Standing balance support: Bilateral upper extremity supported Standing balance-Leahy Scale: Fair                              Cognition Arousal/Alertness: Awake/alert Behavior During Therapy: WFL for tasks assessed/performed Overall Cognitive Status: Within Functional Limits for tasks assessed                                 General Comments: grossly oriented, pleasant and agreeable to therapy despite severe pain      Exercises Total Joint Exercises Ankle Circles/Pumps: 10 reps;Both Quad Sets: Both;10 reps Hip ABduction/ADduction: Right;10 reps Straight Leg Raises: Right;10 reps Long Arc Quad: Right;10 reps Knee Flexion: Right;5 reps Goniometric ROM: 0-58  - extension improved today but flexion limited by pain and bulky post op dressing Marching in Standing: Both;10 reps;Standing Other Exercises Other Exercises: educated pt on OT role and plan of care, fall and safety precautions, polar care and compression stocking management, self care, use of AE for LBD, pet management, and pain control    General Comments        Pertinent Vitals/Pain Pain Assessment: Faces Faces Pain Scale: Hurts whole lot Pain Location: increased pain from this am limiting activity Pain Descriptors / Indicators: Operative site guarding;Aching;Constant;Discomfort;Grimacing;Guarding;Moaning;Restless Pain Intervention(s): RN gave pain meds during session;Limited activity within patient's tolerance;Monitored during session;Repositioned;Ice applied    Home Living Family/patient expects to be  discharged to:: Private residence Living Arrangements: Spouse/significant other Available Help at Discharge: Family(spouse available 24/7 for the next week) Type of Home: House Home Access: Stairs to enter Entrance Stairs-Rails: Left;Right(can't reach both, typically uses L hand on SPC and R Rail) Home Layout: One level Home Equipment: [place] - single point;Tub bench Additional Comments: pt  recently bought lift chair with plans to use at home during recovery    Prior Function Level of Independence: Independent with assistive device(s)      Comments: was using SPC prior to admission. Reports no falls   PT Goals (current goals can now be found in the care plan section) Acute Rehab PT Goals Patient Stated Goal: to go home Progress towards PT goals: Progressing toward goals    Frequency    BID      PT Plan Current plan remains appropriate    Co-evaluation              AM-PAC PT "6 Clicks" Mobility   Outcome Measure  Help needed turning from your back to your side while in a flat bed without using bedrails?: A Little Help needed moving from lying on your back to sitting on the side of a flat bed without using bedrails?: A Little Help needed moving to and from a bed to a chair (including a wheelchair)?: A Little Help needed standing up from a chair using your arms (e.g., wheelchair or bedside chair)?: A Little Help needed to walk in hospital room?: A Little Help needed climbing 3-5 steps with a railing? : A Lot 6 Click Score: 17    End of Session Equipment Utilized During Treatment: Gait belt Activity Tolerance: Patient limited by pain Patient left: in bed;with call bell/phone within reach;with bed alarm set;with nursing/sitter in room Nurse Communication: Mobility status;Patient requests pain meds Pain - Right/Left: Right Pain - part of body: Knee     Time: [id number] PT Time Calculation (min) (ACUTE ONLY): 8 min  Charges:  $Gait Training: 8-22 mins $Therapeutic Exercise: 8-22 mins                    Chesley Noon, PTA 05/21/19, 12:36 PM

## 2019-05-21 NOTE — Evaluation (Signed)
Occupational Therapy Evaluation Patient Details Name: Jordan Palmer MRN: [id number] DOB: 1950/06/21 Today's Date: 05/21/2019    History of Present Illness Pt is a 69 year old female s/p R TKR.  Pt's PMH includes HTN, HLD, vertigo, and osteoarthritis.   Clinical Impression   Pt seen for OT evaluation this date, POD#1 from above surgery. Pt was independent in all ADLs prior to surgery, however using SPC for mobility due to R knee pain. Pt is eager to return to PLOF with less pain and improved safety and independence.  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 Jordan Palmer is very limited by pain, reporting limited pain control despite being premedicated prior to session.  Pt currently requires minimal-moderate assist for LB dressing while in seated position due to pain and limited AROM of R knee. OOB assessment deferred due to pt's severe pain, but she requires min assist for functional transfers and mobility with RW per PT report.  Pt instructed in polar care mgt, falls prevention strategies, home/routines modifications, DME/AE for LB bathing and dressing tasks, pet management, and compression stocking mgt. Handout provided.  Pt would benefit from skilled OT services including additional instruction in dressing techniques with or without assistive devices for dressing and bathing skills to support recall and carryover prior to discharge and ultimately to maximize safety, independence, and minimize falls risk and caregiver burden. Do not currently anticipate any OT needs following this hospitalization.       Follow Up Recommendations  No OT follow up;Supervision - Intermittent    Equipment Recommendations  3 in 1 bedside commode    Recommendations for Other Services       Precautions / Restrictions Precautions Precautions: Knee;Fall Precaution Booklet Issued: No Required Braces or Orthoses:  Knee Immobilizer - Right Knee Immobilizer - Right: (on except with PT) Restrictions Weight Bearing Restrictions: Yes RLE Weight Bearing: Weight bearing as tolerated      Mobility Bed Mobility Overal bed mobility: Needs Assistance Bed Mobility: Supine to Sit     Supine to sit: Min guard        Transfers Overall transfer level: Needs assistance Equipment used: Rolling walker (2 wheeled) Transfers: Sit to/from Stand Sit to Stand: Min assist         General transfer comment: Per PT report    Balance Overall balance assessment: Needs assistance Sitting-balance support: Feet supported;Bilateral upper extremity supported Sitting balance-Leahy Scale: Good     Standing balance support: Bilateral upper extremity supported Standing balance-Leahy Scale: Fair                             ADL either performed or assessed with clinical judgement   ADL Overall ADL's : Needs assistance/impaired                                       General ADL Comments: Pt requires setup assist for seated ADLs, including upper body dressing, grooming, feeding, and bathing. Pt requires min-mod assist with lower body dressing, bathing, and functional mobility.     Vision Baseline Vision/History: Wears glasses(contacts) Wears Glasses: At all times Patient Visual Report: No change from baseline Vision Assessment?: No apparent visual deficits     Perception     Praxis  Pertinent Vitals/Pain Pain Assessment: Faces Faces Pain Scale: Hurts worst Pain Location: R knee.  Pt did not rate, but was highly distracted by pain. Pain Descriptors / Indicators: Operative site guarding;Aching;Constant;Discomfort;Grimacing;Guarding;Moaning;Restless Pain Intervention(s): Limited activity within patient's tolerance;Monitored during session;Premedicated before session     Hand Dominance     Extremity/Trunk Assessment Upper Extremity Assessment Upper Extremity Assessment:  Overall WFL for tasks assessed   Lower Extremity Assessment Lower Extremity Assessment: RLE deficits/detail RLE Deficits / Details: expected post-op pain, ROM, and strength deficits       Communication Communication Communication: No difficulties   Cognition Arousal/Alertness: Awake/alert Behavior During Therapy: WFL for tasks assessed/performed Overall Cognitive Status: Within Functional Limits for tasks assessed                                 General Comments: grossly oriented, pleasant and agreeable to therapy despite severe pain   General Comments       Exercises Total Joint Exercises Ankle Circles/Pumps: 10 reps;Both Quad Sets: Both;10 reps Hip ABduction/ADduction: Right;10 reps Straight Leg Raises: Right;10 reps Long Arc Quad: Right;10 reps Knee Flexion: Right;5 reps Goniometric ROM: 0-58  - extension improved today but flexion limited by pain and bulky post op dressing Marching in Standing: Both;10 reps;Standing Other Exercises Other Exercises: educated pt on OT role and plan of care, fall and safety precautions, polar care and compression stocking management, self care, use of AE for LBD, pet management, and pain control   Shoulder Instructions      Home Living Family/patient expects to be discharged to:: Private residence Living Arrangements: Spouse/significant other Available Help at Discharge: Family(spouse available 24/7 for the next week) Type of Home: House Home Access: Stairs to enter [employer] of Steps: 3 Entrance Stairs-Rails: Left;Right(can't reach both, typically uses L hand on SPC and R Rail) Home Layout: One level     Bathroom Shower/Tub: Occupational psychologist: Handicapped height     Home Equipment: [place] - single point;Tub bench   Additional Comments: pt recently bought lift chair with plans to use at home during recovery      Prior Functioning/Environment Level of Independence: Independent with  assistive device(s)        Comments: was using SPC prior to admission. Reports no falls        OT Problem List: Decreased strength;Decreased range of motion;Decreased activity tolerance;Impaired balance (sitting and/or standing);Decreased knowledge of use of DME or AE;Decreased knowledge of precautions;Pain      OT Treatment/Interventions: Self-care/ADL training;Therapeutic exercise;Neuromuscular education;Energy conservation;DME and/or AE instruction;Therapeutic activities;Patient/family education;Balance training    OT Goals(Current goals can be found in the care plan section) Acute Rehab OT Goals Patient Stated Goal: to go home OT Goal Formulation: With patient Time For Goal Achievement: 06/04/19 Potential to Achieve Goals: Good  OT Frequency: Min 1X/week   Barriers to D/C:            Co-evaluation              AM-PAC OT "6 Clicks" Daily Activity     Outcome Measure Help from another person eating meals?: None Help from another person taking care of personal grooming?: None Help from another person toileting, which includes using toliet, bedpan, or urinal?: A Little Help from another person bathing (including washing, rinsing, drying)?: A Little Help from another person to put on and taking off regular upper body clothing?: None Help from another person to put  on and taking off regular lower body clothing?: A Lot 6 Click Score: 20   End of Session    Activity Tolerance: Patient limited by pain Patient left: in chair;with call bell/phone within reach;with chair alarm set  OT Visit Diagnosis: Other abnormalities of gait and mobility (R26.89);Muscle weakness (generalized) (M62.81);Pain Pain - Right/Left: Right Pain - part of body: Knee                Time: [id number] OT Time Calculation (min): 14 min Charges:  OT General Charges $OT Visit: 1 Visit OT Evaluation $OT Eval Moderate Complexity: 1 Mod OT Treatments $Self Care/Home Management : 8-22 mins  Myrtie Hawk Little Bashore, OTR/L 05/21/19, 12:00 PM

## 2019-05-21 NOTE — Progress Notes (Signed)
  Subjective:  POD #1 s/p right total knee arthroplasty.   Patient reports right knee pain as marked during physical therapy.    Objective:   VITALS:   Vitals:   05/21/19 0522 05/21/19 0806 05/21/19 0953 05/21/19 1156  BP: (!) 100/53 120/64  121/79  Pulse: 77 74  75  Resp: 16 17  18   Temp: 98 F (36.7 C) 98.4 F (36.9 C)  98.1 F (36.7 C)  TempSrc: Oral Oral  Oral  SpO2: 100% 100% 94% 100%  Weight:      Height:        PHYSICAL EXAM: Right lower extremity Neurovascular intact Sensation intact distally Intact pulses distally Dorsiflexion/Plantar flexion intact Incision: dressing C/D/I No cellulitis present Compartment soft  LABS  Results for orders placed or performed during the hospital encounter of 05/20/19 (from the past 24 hour(s))  CBC     Status: Abnormal   Collection Time: 05/21/19  4:24 AM  Result Value Ref Range   WBC 6.5 4.0 - 10.5 K/uL   RBC 3.01 (L) 3.87 - 5.11 MIL/uL   Hemoglobin 9.6 (L) 12.0 - 15.0 g/dL   HCT 28.5 (L) 36.0 - 46.0 %   MCV 94.7 80.0 - 100.0 fL   MCH 31.9 26.0 - 34.0 pg   MCHC 33.7 30.0 - 36.0 g/dL   RDW 12.7 11.5 - 15.5 %   Platelets 224 150 - 400 K/uL   nRBC 0.0 0.0 - 0.2 %  Basic metabolic panel     Status: Abnormal   Collection Time: 05/21/19  4:24 AM  Result Value Ref Range   Sodium 137 135 - 145 mmol/L   Potassium 3.7 3.5 - 5.1 mmol/L   Chloride 106 98 - 111 mmol/L   CO2 23 22 - 32 mmol/L   Glucose, Bld 104 (H) 70 - 99 mg/dL   BUN 23 8 - 23 mg/dL   Creatinine, Ser 0.88 0.44 - 1.00 mg/dL   Calcium 8.3 (L) 8.9 - 10.3 mg/dL   GFR calc non Af Amer >60 >60 mL/min   GFR calc Af Amer >60 >60 mL/min   Anion gap 8 5 - 15    DG Knee Right Port  Result Date: 05/20/2019 CLINICAL DATA:  Primary osteoarthritis of the right knee. Status post total knee arthroplasty. EXAM: PORTABLE RIGHT KNEE - 1-2 VIEW COMPARISON:  None. FINDINGS: The components of the total knee prosthesis appear in excellent position. Postsurgical fluid and air to  the expected degree in the soft tissues and joint. No fractures. IMPRESSION: Satisfactory appearance of the right knee after total knee replacement. Electronically Signed   By: Lorriane Shire M.D.   On: 05/20/2019 11:41    Assessment/Plan: 1 Day Post-Op   Active Problems:   S/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, right  Continue with physical and Occupational Therapy.  Continue current pain management.  Patient will begin Lovenox today for DVT prophylaxis.  Continue Polar Care and elevation to reduce swelling.  Bandage will be changed tomorrow.    Thornton Park , MD 05/21/2019, 1:48 PM

## 2019-05-21 NOTE — Anesthesia Postprocedure Evaluation (Signed)
Anesthesia Post Note  Patient: Jordan Palmer  Procedure(s) Performed: TOTAL KNEE ARTHROPLASTY (Right Knee)  Patient location during evaluation: Nursing Unit Anesthesia Type: Spinal Level of consciousness: awake, awake and alert and oriented Pain management: pain level controlled Vital Signs Assessment: post-procedure vital signs reviewed and stable Respiratory status: spontaneous breathing, nonlabored ventilation and respiratory function stable Cardiovascular status: blood pressure returned to baseline and stable Postop Assessment: no headache and no backache Anesthetic complications: no     Last Vitals:  Vitals:   05/21/19 0522 05/21/19 0806  BP: (!) 100/53 120/64  Pulse: 77 74  Resp: 16 17  Temp: 36.7 C 36.9 C  SpO2: 100% 100%    Last Pain:  Vitals:   05/21/19 0806  TempSrc: Oral  PainSc: 6                  [employer]

## 2019-05-22 LAB — CBC
HCT: 29.4 % — ABNORMAL LOW (ref 36.0–46.0)
Hemoglobin: 10.1 g/dL — ABNORMAL LOW (ref 12.0–15.0)
MCH: 31.8 pg (ref 26.0–34.0)
MCHC: 34.4 g/dL (ref 30.0–36.0)
MCV: 92.5 fL (ref 80.0–100.0)
Platelets: 230 10*3/uL (ref 150–400)
RBC: 3.18 MIL/uL — ABNORMAL LOW (ref 3.87–5.11)
RDW: 13.1 % (ref 11.5–15.5)
WBC: 9.2 10*3/uL (ref 4.0–10.5)
nRBC: 0 % (ref 0.0–0.2)

## 2019-05-22 MED ORDER — ENOXAPARIN SODIUM 40 MG/0.4ML ~~LOC~~ SOLN: 40.0000 mg | SUBCUTANEOUS | 0 refills | Status: DC

## 2019-05-22 MED ORDER — OXYCODONE HCL 5 MG PO TABS: 5.0000 mg | ORAL_TABLET | ORAL | 0 refills | Status: DC | PRN

## 2019-05-22 MED ORDER — DOCUSATE SODIUM 100 MG PO CAPS: 100.0000 mg | ORAL_CAPSULE | Freq: Two times a day (BID) | ORAL | 0 refills | Status: DC

## 2019-05-22 NOTE — Plan of Care (Signed)

## 2019-05-22 NOTE — TOC Transition Note (Signed)
Transition of Care [facility]) - CM/SW Discharge Note   Patient Details  Name: Jordan Palmer MRN: [id number] Date of Birth: 1951/01/30  [facility] CM/SW Contact:  Elease Hashimoto, LCSW Phone Number: 05/22/2019, 2:13 PM   Clinical Narrative:   Pt has equipment and home health arranged via [facility] for PT follow up. Husband will be off for one week to help with transition. Pt feeling better each day. No further follow due to DC    Final next level of care: [place] Barriers to Discharge: Barriers Resolved   Patient Goals and CMS Choice Patient states their goals for this hospitalization and ongoing recovery are:: My husband plans to take one week off to be there for me CMS Medicare.gov Compare Post Acute Care list provided to:: Patient Choice offered to / list presented to : Patient  Discharge Placement                Patient to be transferred to facility by: husband via car Name of family member notified: Husband Patient and family notified of of transfer: 05/22/19  Discharge Plan and Services In-house Referral: Clinical Social Work   Post Acute Care Choice: Home Health, Durable Medical Equipment          DME Arranged: 3-N-1, Walker rolling DME Agency: [employer] Date DME Agency Contacted: 05/20/19 Time DME Agency Contacted: [phone] Representative spoke with at DME Agency: brad HH Arranged: PT [place]: [facility] Date [place]: 05/21/19 Time HH Agency Contacted: 1000 Representative spoke with at [place]: brittany  Social Determinants of Health ([place]) Interventions     Readmission Risk Interventions No flowsheet data found.

## 2019-05-22 NOTE — Progress Notes (Signed)
Physical Therapy Treatment Patient Details Name: Jordan Palmer MRN: [id number] DOB: 1950-02-13 Today's Date: 05/22/2019    History of Present Illness Pt is a 69 year old female s/p R TKR.  Pt's PMH includes HTN, HLD, vertigo, and osteoarthritis.    PT Comments    Pt was seated in recliner upon arriving. She is A and O x 4 and agrees to PT session. She stood and ambulated to [facility] without assistance. Then stood and ambulated 160 ft and performed ascending/descedning stairs without assistance. Pt is progressing well with PT. Issued HEP handout and pt states understanding. Lengthy discussion about DC and answering pt's/pt's spouses questions. She overall demonstrates safe ability with all mobility, transfers, and gait. Cleared from PT standpoint for safe DC home with HHPT to follow to continue to address strength, ROM, and safe functional mobility deficits.      Follow Up Recommendations  Home health PT;Supervision/Assistance - 24 hour     Equipment Recommendations  Rolling walker with 5" wheels    Recommendations for Other Services       Precautions / Restrictions Precautions Precautions: Knee;Fall Precaution Booklet Issued: Yes (comment) Required Braces or Orthoses: Knee Immobilizer - Right Restrictions Weight Bearing Restrictions: Yes RLE Weight Bearing: Weight bearing as tolerated    Mobility  Bed Mobility Overal bed mobility: Modified Independent Bed Mobility: Sit to Supine       Sit to supine: Supervision   General bed mobility comments: pt was able to get back into bed without assistance. Vcs for technique and sequencing.   Transfers Overall transfer level: Modified independent Equipment used: Rolling walker (2 wheeled) Transfers: Sit to/from Stand Sit to Stand: Modified independent (Device/Increase time)         General transfer comment: pt demonstrated safe ability to stand from recliner, EOB, and toilet without assistance  Ambulation/Gait Ambulation/Gait  assistance: Supervision Gait Distance (Feet): 160 Feet Assistive device: Rolling walker (2 wheeled) Gait Pattern/deviations: Step-through pattern;Antalgic Gait velocity: decreased   General Gait Details: Pt demonstrates safe steady gait kinematics without LOB or unsteadiness   Stairs Stairs: Yes Stairs assistance: Supervision Stair Management: Two rails Number of Stairs: 4 General stair comments: Pt was able to safely ascend/descend stairs with BUE support    Wheelchair Mobility    Modified Rankin (Stroke Patients Only)       Balance Overall balance assessment: Modified Independent Sitting-balance support: Feet supported Sitting balance-Leahy Scale: Good     Standing balance support: Bilateral upper extremity supported Standing balance-Leahy Scale: Good                              Cognition Arousal/Alertness: Awake/alert Behavior During Therapy: WFL for tasks assessed/performed Overall Cognitive Status: Within Functional Limits for tasks assessed                                 General Comments: Pt isA and O x 4. she is pleasant and agreeable to PT session.      Exercises Total Joint Exercises Ankle Circles/Pumps: 10 reps;Both Quad Sets: Both;10 reps Hip ABduction/ADduction: Right;10 reps Straight Leg Raises: Right;10 reps Long Arc Quad: Right;10 reps Knee Flexion: Right;5 reps Goniometric ROM: 0-58 - limited by bulky dressing and pain Marching in Standing: Both;10 reps;Standing    General Comments        Pertinent Vitals/Pain Pain Assessment: 0-10 Pain Score: 4  Faces Pain Scale: Hurts  a little bit Pain Location: knee Pain Descriptors / Indicators: Operative site guarding;Aching;Constant;Discomfort;Grimacing;Guarding;Moaning;Restless Pain Intervention(s): Limited activity within patient's tolerance;Monitored during session;Premedicated before session;Ice applied    Home Living                      Prior Function             PT Goals (current goals can now be found in the care plan section) Acute Rehab PT Goals Patient Stated Goal: to go home Progress towards PT goals: Progressing toward goals    Frequency    BID      PT Plan Current plan remains appropriate    Co-evaluation              AM-PAC PT "6 Clicks" Mobility   Outcome Measure  Help needed turning from your back to your side while in a flat bed without using bedrails?: None Help needed moving from lying on your back to sitting on the side of a flat bed without using bedrails?: None Help needed moving to and from a bed to a chair (including a wheelchair)?: None Help needed standing up from a chair using your arms (e.g., wheelchair or bedside chair)?: None Help needed to walk in hospital room?: A Little Help needed climbing 3-5 steps with a railing? : A Little 6 Click Score: 22    End of Session Equipment Utilized During Treatment: Gait belt Activity Tolerance: Patient tolerated treatment well Patient left: in bed;with call bell/phone within reach;with bed alarm set;with family/visitor present Nurse Communication: Mobility status PT Visit Diagnosis: Muscle weakness (generalized) (M62.81);Difficulty in walking, not elsewhere classified (R26.2);Pain Pain - Right/Left: Right Pain - part of body: Knee     Time: 1320-1350 PT Time Calculation (min) (ACUTE ONLY): 30 min  Charges:  $Gait Training: 8-22 mins $Therapeutic Exercise: 8-22 mins                    Julaine Fusi PTA 05/22/19, 2:00 PM

## 2019-05-22 NOTE — Progress Notes (Signed)
  Subjective:  POD #2 s/p right TKA.   Patient reports right knee pain as moderate.  Patient states she is progressing with PT but she has increase knee pain during PT.  Patient has not yet had a bowel movement.  Objective:   VITALS:   Vitals:   05/21/19 1709 05/21/19 2342 05/22/19 0811 05/22/19 1710  BP: 117/64 (!) 109/59 121/67 111/60  Pulse: 79 89 76 81  Resp:  17 16 16   Temp:  98.2 F (36.8 C) 98.2 F (36.8 C) 98.1 F (36.7 C)  TempSrc:  Oral Oral Oral  SpO2:  94% 100% 95%  Weight:      Height:        PHYSICAL EXAM: Right lower extremity Neurovascular intact Sensation intact distally Intact pulses distally Dorsiflexion/Plantar flexion intact Incision: no drainage No cellulitis present Compartment soft  LABS  Results for orders placed or performed during the hospital encounter of 05/20/19 (from the past 24 hour(s))  CBC     Status: Abnormal   Collection Time: 05/22/19  4:39 AM  Result Value Ref Range   WBC 9.2 4.0 - 10.5 K/uL   RBC 3.18 (L) 3.87 - 5.11 MIL/uL   Hemoglobin 10.1 (L) 12.0 - 15.0 g/dL   HCT 29.4 (L) 36.0 - 46.0 %   MCV 92.5 80.0 - 100.0 fL   MCH 31.8 26.0 - 34.0 pg   MCHC 34.4 30.0 - 36.0 g/dL   RDW 13.1 11.5 - 15.5 %   Platelets 230 150 - 400 K/uL   nRBC 0.0 0.0 - 0.2 %    No results found.  Assessment/Plan: 2 Days Post-Op   Active Problems:   S/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, right  Patient improving post-op.  Continue PT and lovenox.   Expect discharge home tomorrow.    Thornton Park , MD 05/22/2019, 5:39 PM

## 2019-05-22 NOTE — Progress Notes (Signed)
Physical Therapy Treatment Patient Details Name: Jordan Palmer MRN: [id number] DOB: 09-Mar-1950 Today's Date: 05/22/2019    History of Present Illness Pt is a 69 year old female s/p R TKR.  Pt's PMH includes HTN, HLD, vertigo, and osteoarthritis.    PT Comments    Participated in exercises as described below.  Out of bed and able to stand at sink to put in her contacts with supervision.  She is able to progress gait around unit with RW and min guard/supervision.  Steady with no LOB.   Will complete stair training this pm session in case pt is discharged home today.   Follow Up Recommendations  Home health PT;Supervision/Assistance - 24 hour     Equipment Recommendations  Rolling walker with 5" wheels    Recommendations for Other Services       Precautions / Restrictions Precautions Precautions: Knee;Fall Required Braces or Orthoses: Knee Immobilizer - Right Restrictions Weight Bearing Restrictions: Yes RLE Weight Bearing: Weight bearing as tolerated    Mobility  Bed Mobility Overal bed mobility: Modified Independent                Transfers Overall transfer level: Modified independent Equipment used: Rolling walker (2 wheeled) Transfers: Sit to/from Stand Sit to Stand: Min guard            Ambulation/Gait Ambulation/Gait assistance: Counsellor (Feet): 160 Feet Assistive device: Rolling walker (2 wheeled) Gait Pattern/deviations: Step-to pattern;Decreased step length - right;Decreased step length - left;Decreased stance time - right Gait velocity: decreased       Stairs             Wheelchair Mobility    Modified Rankin (Stroke Patients Only)       Balance Overall balance assessment: Modified Independent Sitting-balance support: Feet supported Sitting balance-Leahy Scale: Good     Standing balance support: Bilateral upper extremity supported Standing balance-Leahy Scale: Good                               Cognition Arousal/Alertness: Awake/alert Behavior During Therapy: WFL for tasks assessed/performed Overall Cognitive Status: Within Functional Limits for tasks assessed                                 General Comments: grossly oriented, pleasant and agreeable to therapy despite severe pain      Exercises Total Joint Exercises Ankle Circles/Pumps: 10 reps;Both Quad Sets: Both;10 reps Hip ABduction/ADduction: Right;10 reps Straight Leg Raises: Right;10 reps Long Arc Quad: Right;10 reps Knee Flexion: Right;5 reps Goniometric ROM: 0-58 - limited by bulky dressing and pain Marching in Standing: Both;10 reps;Standing    General Comments        Pertinent Vitals/Pain Pain Assessment: Faces Faces Pain Scale: Hurts even more Pain Location: continues with pain but better controlled today and able to walk Pain Descriptors / Indicators: Operative site guarding;Aching;Constant;Discomfort;Grimacing;Guarding;Moaning;Restless Pain Intervention(s): Limited activity within patient's tolerance;Monitored during session;Repositioned;Ice applied    Home Living                      Prior Function            PT Goals (current goals can now be found in the care plan section) Progress towards PT goals: Progressing toward goals    Frequency    BID      PT Plan Current  plan remains appropriate    Co-evaluation              AM-PAC PT "6 Clicks" Mobility   Outcome Measure  Help needed turning from your back to your side while in a flat bed without using bedrails?: None Help needed moving from lying on your back to sitting on the side of a flat bed without using bedrails?: None Help needed moving to and from a bed to a chair (including a wheelchair)?: None Help needed standing up from a chair using your arms (e.g., wheelchair or bedside chair)?: None Help needed to walk in hospital room?: A Little Help needed climbing 3-5 steps with a railing? : A Little 6  Click Score: 22    End of Session Equipment Utilized During Treatment: Gait belt Activity Tolerance: Patient tolerated treatment well Patient left: in chair;with call bell/phone within reach;with chair alarm set Nurse Communication: Mobility status Pain - Right/Left: Right Pain - part of body: Knee     Time: [id number] PT Time Calculation (min) (ACUTE ONLY): 23 min  Charges:  $Gait Training: 8-22 mins $Therapeutic Exercise: 8-22 mins                    Chesley Noon, PTA 05/22/19, 10:57 AM

## 2019-05-23 NOTE — Care Management Important Message (Signed)
Important Message  Patient Details  Name: Jordan Palmer MRN: [id number] Date of Birth: [birth year]   Medicare Important Message Given:  N/A - LOS <3 / Initial given by admissions     Juliann Pulse A Zianne Schubring 05/23/2019, 8:34 AM

## 2019-05-23 NOTE — Progress Notes (Signed)
Physical Therapy Treatment Patient Details Name: Jordan Palmer MRN: [id number] DOB: [birth year] Today's Date: 05/23/2019    History of Present Illness Pt is a 69 year old female s/p R TKR.  Pt's PMH includes HTN, HLD, vertigo, and osteoarthritis.    PT Comments    Pt was long sitting in bed with spouse present upon arriving. She agrees to PT session and was cooperative and pleasant throughout. Therapist discussed proper application and removal of knee immobilizer/polar care with pt/pt's spouse. Both state understanding. She was able to exit R side bed without assistance stood and ambulated 200 ft without LOB or unsteadiness. She demonstrated to her husband proper sequencing with ascending/descending 4 stair with BUE support. Pt is overall progressing well with PT but will benefit from HHPT at DC to address ROM and strength deficits while assisting pt to returning to PLOF.    Follow Up Recommendations  Home health PT;Supervision/Assistance - 24 hour     Equipment Recommendations  None recommended by PT;Other (comment)(pt has all required DME)    Recommendations for Other Services       Precautions / Restrictions Precautions Precautions: Knee;Fall Precaution Booklet Issued: Yes (comment) Required Braces or Orthoses: Knee Immobilizer - Right Restrictions Weight Bearing Restrictions: Yes RLE Weight Bearing: Weight bearing as tolerated    Mobility  Bed Mobility Overal bed mobility: Modified Independent             General bed mobility comments: no assistance or vcs for exiting/re-entering bed  Transfers Overall transfer level: Modified independent Equipment used: Rolling walker (2 wheeled) Transfers: Sit to/from Stand Sit to Stand: Modified independent (Device/Increase time)         General transfer comment: pt was able to stand/sit without assistance.  Ambulation/Gait Ambulation/Gait assistance: Supervision Gait Distance (Feet): 200 Feet Assistive device: Rolling  walker (2 wheeled) Gait Pattern/deviations: Step-through pattern;Antalgic Gait velocity: decreased   General Gait Details: no LOB or unsteadiness during gait   Stairs Stairs: Yes Stairs assistance: Supervision Stair Management: Two rails Number of Stairs: 4 General stair comments: Pt was able to correctly and safely perform stairs with spouse present. No difficulty noted   Wheelchair Mobility    Modified Rankin (Stroke Patients Only)       Balance Overall balance assessment: Modified Independent Sitting-balance support: Feet supported Sitting balance-Leahy Scale: Good     Standing balance support: Bilateral upper extremity supported Standing balance-Leahy Scale: Good                              Cognition Arousal/Alertness: Awake/alert Behavior During Therapy: WFL for tasks assessed/performed Overall Cognitive Status: Within Functional Limits for tasks assessed                                 General Comments: Pt isA and O x 4. she is pleasant and agreeable to PT session.      Exercises      General Comments        Pertinent Vitals/Pain Pain Assessment: 0-10 Pain Score: 3  Faces Pain Scale: Hurts a little bit Pain Location: knee Pain Descriptors / Indicators: Operative site guarding;Aching;Constant;Discomfort;Grimacing;Guarding;Moaning;Restless Pain Intervention(s): Limited activity within patient's tolerance;Monitored during session;Premedicated before session;Repositioned;Ice applied    Home Living                      Prior Function  PT Goals (current goals can now be found in the care plan section) Acute Rehab PT Goals Patient Stated Goal: to go home Progress towards PT goals: Progressing toward goals    Frequency    BID      PT Plan Current plan remains appropriate    Co-evaluation              AM-PAC PT "6 Clicks" Mobility   Outcome Measure  Help needed turning from your back to  your side while in a flat bed without using bedrails?: None Help needed moving from lying on your back to sitting on the side of a flat bed without using bedrails?: None Help needed moving to and from a bed to a chair (including a wheelchair)?: None Help needed standing up from a chair using your arms (e.g., wheelchair or bedside chair)?: None Help needed to walk in hospital room?: A Little Help needed climbing 3-5 steps with a railing? : A Little 6 Click Score: 22    End of Session Equipment Utilized During Treatment: Gait belt Activity Tolerance: Patient tolerated treatment well Patient left: in bed;with call bell/phone within reach;with bed alarm set;with family/visitor present Nurse Communication: Mobility status PT Visit Diagnosis: Muscle weakness (generalized) (M62.81);Difficulty in walking, not elsewhere classified (R26.2);Pain Pain - Right/Left: Right Pain - part of body: Knee     Time: [id number] PT Time Calculation (min) (ACUTE ONLY): 23 min  Charges:  $Gait Training: 8-22 mins $Therapeutic Exercise: 8-22 mins                     Julaine Fusi PTA 05/23/19, 2:01 PM

## 2019-05-23 NOTE — Progress Notes (Signed)
Pt was d/c'd home via private vehicle this afternoon driven by husband.  All d/c paperwork was reviewed with pt and she expressed understanding.  TEDs were placed on both LE, polar care and knee immobilizer were packed up.  RW and BSC were taken as well.  IV removed from pts R FA without issue.  She was wheeled to med mall by this nurse.  NAD noted at d/c skin warm dry clean and intact.

## 2019-05-23 NOTE — Progress Notes (Signed)
  Subjective:  POD #3 s/p right TKA.   Patient reports right knee pain as mild to moderate.   She has had a bowel movement.  Objective:   VITALS:   Vitals:   05/22/19 0811 05/22/19 1710 05/22/19 2323 05/23/19 0804  BP: 121/67 111/60 109/65 124/67  Pulse: 76 81 88 73  Resp: 16 16 16 18   Temp: 98.2 F (36.8 C) 98.1 F (36.7 C) 98.5 F (36.9 C) 98.1 F (36.7 C)  TempSrc: Oral Oral Oral Oral  SpO2: 100% 95% 97% 99%  Weight:      Height:        PHYSICAL EXAM: Right lower extremity Neurovascular intact Sensation intact distally Intact pulses distally Dorsiflexion/Plantar flexion intact Incision: dressing C/D/I No cellulitis present Compartment soft  LABS  No results found for this or any previous visit (from the past 24 hour(s)).  No results found.  Assessment/Plan: 3 Days Post-Op   Active Problems:   S/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, right  Patient is doing well postop.  She will complete her afternoon physical therapy and then will be discharged home.  Patient is set up for home health PT.  Patient will continue Lovenox for DVT prophylaxis until follow-up in the office.  Patient will follow up with Dr. Mack Guise in the office on 06/02/19 @ 11:30 AM.    Thornton Park , MD 05/23/2019, 12:57 PM

## 2019-05-26 ENCOUNTER — Other Ambulatory Visit: Payer: Self-pay

## 2019-05-26 ENCOUNTER — Ambulatory Visit: Payer: Medicare Other | Admitting: Physical Therapy

## 2019-05-28 ENCOUNTER — Other Ambulatory Visit: Payer: Self-pay

## 2019-05-28 ENCOUNTER — Ambulatory Visit: Payer: Medicare Other | Admitting: Physical Therapy

## 2019-05-28 ENCOUNTER — Ambulatory Visit: Payer: Medicare Other | Attending: Orthopedic Surgery | Admitting: Physical Therapy

## 2019-05-28 DIAGNOSIS — M6281 Muscle weakness (generalized): Secondary | ICD-10-CM | POA: Diagnosis present

## 2019-05-28 DIAGNOSIS — M25661 Stiffness of right knee, not elsewhere classified: Secondary | ICD-10-CM

## 2019-05-28 DIAGNOSIS — R262 Difficulty in walking, not elsewhere classified: Secondary | ICD-10-CM

## 2019-05-28 DIAGNOSIS — M25561 Pain in right knee: Secondary | ICD-10-CM | POA: Diagnosis not present

## 2019-05-28 NOTE — Patient Instructions (Signed)
Access Code: [email or web address]: [email or web address]: 04/28/2021Prepared by: Legrand Como SherkExercises  Supine Gluteal Sets - 2 x daily - 7 x weekly - 1 sets - 10 reps - 10 seconds hold  Supine Quad Set - 2 x daily - 7 x weekly - 1 sets - 10 reps - 10 seconds hold  Supine Heel Slide with Strap - 2 x daily - 7 x weekly - 1 sets - 10 reps  Straight Leg Raise - 2 x daily - 7 x weekly - 1 sets - 10 reps  Seated Heel Slide - 2 x daily - 7 x weekly - 1 sets - 10 reps - 20 seconds hold  Standing March - 2 x daily - 7 x weekly - 1 sets - 10 reps  Standing Hip Abduction with Counter Support - 2 x daily - 7 x weekly - 1 sets - 10 reps

## 2019-05-29 NOTE — Discharge Summary (Signed)
Physician Discharge Summary  Patient ID: Jordan Palmer MRN: [id number] DOB/AGE: [birth year] 69 y.o.  Admit date: 05/20/2019 Discharge date: 05/29/2019  Admission Diagnoses:  Unilateral Primary Osteoarthritis, Right knee <principal problem not specified>  Discharge Diagnoses:  Unilateral Primary Osteoarthritis, Right knee Active Problems:   S/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, right   Past Medical History:  Diagnosis Date  . Arthritis   . Hyperlipemia   . Hypertension   . Hypothyroidism   . Sleep apnea   . Vertigo     Surgeries: Procedure(s): TOTAL KNEE ARTHROPLASTY on 05/20/2019   Consultants (if any):   Discharged Condition: Improved  Hospital Course: Jordan Palmer is an 69 y.o. female who was admitted 05/20/2019 with a diagnosis of  Unilateral Primary Osteoarthritis, Right knee <principal problem not specified> and went to the operating room on 05/20/2019 and underwent an uncomplicated right total knee arthroplasty.    She was given perioperative antibiotics:  Anti-infectives (From admission, onward)   Start     Dose/Rate Route Frequency Ordered Stop   05/20/19 2200  clindamycin (CLEOCIN) IVPB 600 mg     600 mg 100 mL/hr over 30 Minutes Intravenous Every 8 hours 05/20/19 1917 05/21/19 1130   05/20/19 1500  ceFAZolin (ANCEF) IVPB 1 g/50 mL premix     1 g 100 mL/hr over 30 Minutes Intravenous Every 6 hours 05/20/19 1357 05/21/19 0123   05/20/19 0615  clindamycin (CLEOCIN) IVPB 600 mg     600 mg 100 mL/hr over 30 Minutes Intravenous  Once 05/20/19 0604 05/20/19 0844   05/20/19 0615  ceFAZolin (ANCEF) IVPB 2g/100 mL premix     2 g 200 mL/hr over 30 Minutes Intravenous On call to O.R. 05/20/19 [id number] 05/20/19 [id number]    .  She was given sequential compression devices, early ambulation, and lovenox for DVT prophylaxis.  She benefited maximally from the hospital stay and there were no complications.    Recent vital signs:  Vitals:   05/22/19 2323 05/23/19 0804   BP: 109/65 124/67  Pulse: 88 73  Resp: 16 18  Temp: 98.5 F (36.9 C) 98.1 F (36.7 C)  SpO2: 97% 99%    Recent laboratory studies:  Lab Results  Component Value Date   HGB 10.1 (L) 05/22/2019   HGB 9.6 (L) 05/21/2019   HGB 12.4 05/16/2019   Lab Results  Component Value Date   WBC 9.2 05/22/2019   PLT 230 05/22/2019   Lab Results  Component Value Date   INR 1.0 05/16/2019   Lab Results  Component Value Date   NA 137 05/21/2019   K 3.7 05/21/2019   CL 106 05/21/2019   CO2 23 05/21/2019   BUN 23 05/21/2019   CREATININE 0.88 05/21/2019   GLUCOSE 104 (H) 05/21/2019    Discharge Medications:   Allergies as of 05/23/2019      Reactions   Codeine Nausea And Vomiting   Doxycycline Photosensitivity      Medication List    STOP taking these medications   aspirin EC 325 MG tablet   HYDROcodone-acetaminophen 5-325 MG tablet Commonly known as: Norco   meloxicam 7.5 MG tablet Commonly known as: MOBIC   ondansetron 4 MG tablet Commonly known as: Zofran     TAKE these medications   acetaminophen 500 MG tablet Commonly known as: TYLENOL Take 1,000 mg by mouth every 8 (eight) hours as needed (pain). Notes to patient: Last dose given 05/21/2019 at 9:49pm   docusate sodium 100 MG capsule Commonly known  as: COLACE Take 1 capsule (100 mg total) by mouth 2 (two) times daily. Notes to patient: Last dose given 05/22/2019 at 09:09am    enoxaparin 40 MG/0.4ML injection Commonly known as: LOVENOX Inject 0.4 mLs (40 mg total) into the skin daily for 14 days. Notes to patient: Last dose given 05/23/2019 at 08:32am   fexofenadine 180 MG tablet Commonly known as: ALLEGRA Take 180 mg by mouth daily as needed for allergies. Notes to patient: Not given while in hospital.   fluticasone 50 MCG/ACT nasal spray Commonly known as: FLONASE Place 1 spray into both nostrils daily. Notes to patient: Last dose given 05/22/2019 at 09:12   levothyroxine 88 MCG tablet Commonly  known as: SYNTHROID Take 88 mcg by mouth daily before breakfast. Notes to patient: Last dose give 05/23/2019 at 05:11am   lisinopril-hydrochlorothiazide 20-25 MG tablet Commonly known as: ZESTORETIC Take 1 tablet by mouth daily. Notes to patient: Not given while in hospital.    oxyCODONE 5 MG immediate release tablet Commonly known as: Oxy IR/ROXICODONE Take 1 tablet (5 mg total) by mouth every 4 (four) hours as needed for moderate pain (pain score 4-6). What changed:   when to take this  reasons to take this Notes to patient: Last dose given 05/23/2019 at 08:41am    Premarin vaginal cream Generic drug: conjugated estrogens 1/2 gram vaginally twice weekly What changed:   how much to take  how to take this  when to take this  additional instructions Notes to patient: Not used while in hospital.    rosuvastatin 5 MG tablet Commonly known as: CRESTOR Take 5 mg by mouth daily. Notes to patient: Last dose given 05/23/2019 at 08:33am       Diagnostic Studies: DG Knee Right Port  Result Date: 05/20/2019 CLINICAL DATA:  Primary osteoarthritis of the right knee. Status post total knee arthroplasty. EXAM: PORTABLE RIGHT KNEE - 1-2 VIEW COMPARISON:  None. FINDINGS: The components of the total knee prosthesis appear in excellent position. Postsurgical fluid and air to the expected degree in the soft tissues and joint. No fractures. IMPRESSION: Satisfactory appearance of the right knee after total knee replacement. Electronically Signed   By: Lorriane Shire M.D.   On: 05/20/2019 11:41    Disposition: Discharge disposition: 01-Home or Self Care       Discharge Instructions    Call MD / Call 911   Complete by: As directed    If you experience chest pain or shortness of breath, CALL 911 and be transported to the hospital emergency room.  If you develope a fever above 101 F, pus (white drainage) or increased drainage or redness at the wound, or calf pain, call your surgeon's  office.   Constipation Prevention   Complete by: As directed    Drink plenty of fluids.  Prune juice may be helpful.  You may use a stool softener, such as Colace (over the counter) 100 mg twice a day.  Use MiraLax (over the counter) for constipation as needed.   Diet - low sodium heart healthy   Complete by: As directed    Discharge instructions   Complete by: As directed    Continue WBAT on the right lower extremity as pain allows. Continue to use TED stockings until follow-up. Patient may remove them at night for sleep. Elevate the right lower extremity whenever possible. Continue to use knee immobilizer at night or when lying in bed or when elevating the operative leg. The patient may remove the knee immobilizer  to perform exercises or sit in a chair. Continue using the Polar Care for comfort. Keep incision clean and dry. Cover the right knee incision during showers with a plastic bag or Saran wrap. Take lovenox 40 mg injection once a day for blood clot prevention. Continue to work on knee range of motion exercises at home as instructed by physical therapy. Continue to use a walker for assistance with ambulation until follow-up. Follow up with Dr. Mack Guise on 06/02/19 at 11:30 AM.   Increase activity slowly as tolerated   Complete by: As directed       Follow-up Information    Thornton Park, MD On 06/02/2019.   Specialty: Orthopedic Surgery Why: at [place] information: [place] [address] [phone]            Signed: Thornton Park ,MD 05/29/2019, 3:50 PM

## 2019-05-30 NOTE — Therapy (Addendum)
[facility] [address] [place], [place], [address] Phone: [phone]   Fax:  [phone]  Physical Therapy Evaluation  Patient Details  Name: Jordan Palmer MRN: [id number] Date of Birth: 04/01/1950 Referring Provider (PT): Dr. Mack Guise   Encounter Date: 05/28/2019   PT End of Session - 05/30/19 1454    Visit Number  1    Number of Visits  9    Date for PT Re-Evaluation  06/25/19    Authorization - Visit Number  1    Authorization - Number of Visits  10    PT Start Time  0813    PT Stop Time  0914    PT Time Calculation (min)  61 min    Equipment Utilized During Treatment  --    Activity Tolerance  Patient tolerated treatment well;Patient limited by pain    Behavior During Therapy  [facility] for tasks assessed/performed          Past Medical History:  Diagnosis Date  . Arthritis   . Hyperlipemia   . Hypertension   . Hypothyroidism   . Sleep apnea   . Vertigo     Past Surgical History:  Procedure Laterality Date  . ANTERIOR AND POSTERIOR REPAIR N/A 03/12/2017   Procedure: ANTERIOR (CYSTOCELE) AND POSTERIOR REPAIR (RECTOCELE);  Surgeon: Salvadore Dom, MD;  Location: [facility];  Service: Gynecology;  Laterality: N/A;  . CARPAL TUNNEL RELEASE Bilateral   . CHOLECYSTECTOMY    . CYSTOSCOPY N/A 03/12/2017   Procedure: CYSTOSCOPY;  Surgeon: Salvadore Dom, MD;  Location: [facility];  Service: Gynecology;  Laterality: N/A;  . EYE SURGERY    . KNEE ARTHROSCOPY Left   . KNEE ARTHROSCOPY WITH MEDIAL MENISECTOMY Right 02/13/2019   Procedure: KNEE ARTHROSCOPY WITH MEDIAL MENISECTOMY;  Surgeon: Thornton Park, MD;  Location: [facility];  Service: Orthopedics;  Laterality: Right;  . LACRIMAL DUCT PROBING W/ DACRYOPLASTY Bilateral   . SALPINGOOPHORECTOMY Left 03/12/2017   Procedure: SALPINGECTOMY;  Surgeon: Salvadore Dom, MD;  Location: [facility];  Service:  Gynecology;  Laterality: Left;  . SHOULDER ARTHROSCOPY WITH OPEN ROTATOR CUFF REPAIR Right 09/03/2014   Procedure: SHOULDER ARTHROSCOPY WITH OPEN ROTATOR CUFF REPAIR;  Surgeon: Thornton Park, MD;  Location: [facility];  Service: Orthopedics;  Laterality: Right;  . THYROIDECTOMY Right    hemi-thyroidectomy  . TOTAL KNEE ARTHROPLASTY Right 05/20/2019   Procedure: TOTAL KNEE ARTHROPLASTY;  Surgeon: Thornton Park, MD;  Location: [facility];  Service: Orthopedics;  Laterality: Right;  Marland Kitchen VAGINAL HYSTERECTOMY N/A 03/12/2017   Procedure: HYSTERECTOMY VAGINAL;  Surgeon: Salvadore Dom, MD;  Location: [facility];  Service: Gynecology;  Laterality: N/A;    There were no vitals filed for this visit.   Subjective Assessment - 06/01/19 2018    Subjective  Pt. s/p R TKA on 05/20/19.  Pt. known well to PT clinic.  Pt. reports 8/10 R knee pain currently (taking Oxy).  Pt. reports 0/10 R knee pain with leg elevated and use of Polar Care ice.  Pt. returns to MD to have stitches removed on 5/3.    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  Pt. received PT prior to/ after knee scope this year.  See PT notes.    Limitations  Standing;Walking;House hold activities    Patient Stated Goals  Increase R knee ROM/ strength/ improve pain-free walking.    Currently  in Pain?  Yes    Pain Score  8     Pain Location  Knee    Pain Orientation  Right    Pain Descriptors / Indicators  Aching;Tightness    Pain Type  Acute pain;Surgical pain         See flowsheet    Objective measurements completed on examination: See above findings.     See HEP  Manual tx.:  Supine R knee AA/PROM: flexion 5x with >30 sec. Static holds.  Supine R hamstring stretches (4x gentle distal stretches)     PT Education - 06/01/19 2031    Education Details  See HEP    Person(s) Educated  Patient    Methods  Explanation;Demonstration;Handout    Comprehension  Verbalized  understanding;Returned demonstration          PT Long Term Goals - 06/01/19 2038      PT LONG TERM GOAL #1   Title  Pt. will increase FOTO to 49 to improve functional mobility.    Baseline  initial FOTO: 22    Time  4    Period  Weeks    Status  New    Target Date  06/25/19      PT LONG TERM GOAL #2   Title  Pt. will increase R knee AROM (0 to >115 deg.) to improve independence with gait.    Baseline  R knee AROM: -5 to 86 deg.    Time  4    Period  Weeks    Status  New    Target Date  06/25/19      PT LONG TERM GOAL #3   Title  Patient will demonstrate improved strength of BLE as evidenced by 1/2 increase in MMT for increased function at home and in the community.    Baseline  R hip flexion/ abduction strength 4/5 MMT, R quad 4-/5 MMT, R hamstring 4/5 MMT.    Time  4    Period  Weeks    Status  New    Target Date  06/25/19      PT LONG TERM GOAL #4   Title  Pt. able to ambulate with consistent 2-point gait pattern and use of SPC on level surfaces safely.    Baseline  pt. currently amb. with RW    Time  4    Period  Weeks    Status  New    Target Date  06/25/19             Plan - 06/01/19 2032    Clinical Impression Statement  Pt. is a pleasant 69 y/o female s/p R TKA on 05/20/19.  Pt. reports 8/10 R knee pain currently at rest and presents with bandages/ wrap around R knee.  Pt. scheduled to return to MD next Monday for stitches to be removed.  Pt. reports 0/10 R knee pain wtih elevated position/ meds/ ice.  L knee AROM (0-108 deg.).  R knee AROM: (-5 to 86 deg.)- significant c/o pain/ joint stiffness in supine position.  R hip flexion/ abduction strength 4/5 MMT, R quad 4-/5 MMT, R hamstring 4/5 MMT.  Unable to evaluate R knee incision/ swelling at this time.  Pt. ambulates with moderate R antalgic gait pattern with RW in PT clinic.  Limited R hip/knee flexion during swing through phase of gait.  FOTO: initial 22/ goal 49.  Pt. will benefit from skilled PT services  to increse R knee ROM/ strength to improve functional mobility/ pain-free  walking.    Personal Factors and Comorbidities  Age;Behavior Pattern;Profession;Past/Current Experience;Time since onset of injury/illness/exacerbation;Fitness;Comorbidity 3+    Comorbidities  HTN, HLD, OA, sleep apnea, vertigo    Examination-Activity Limitations  Squat;Lift;Stairs;Stand;Locomotion Level;Sit;Transfers;Sleep;Bend;Bed Mobility    Examination-Participation Restrictions  Shop;Laundry;Cleaning;Yard Work;Community Activity    Stability/Clinical Decision Making  Evolving/Moderate complexity    Clinical Decision Making  Moderate    Rehab Potential  Good    PT Frequency  2x / week    PT Duration  4 weeks    PT Treatment/Interventions  ADLs/Self Care Home Management;Cryotherapy;Electrical Stimulation;Moist Heat;Stair training;Gait training;Therapeutic activities;Functional mobility training;Therapeutic exercise;Balance training;Orthotic Fit/Training;Patient/family education;Neuromuscular re-education;Manual techniques;Dry needling;Passive range of motion;Scar mobilization;Taping;Joint Manipulations    PT Next Visit Plan  progress R knee ROM/ send MD note.    PT Home Exercise Plan  [facility]    Consulted and Agree with Plan of Care  Patient       Patient will benefit from skilled therapeutic intervention in order to improve the following deficits and impairments:  Abnormal gait, Decreased balance, Decreased endurance, Decreased mobility, Difficulty walking, Pain, Postural dysfunction, Decreased strength, Decreased activity tolerance, Decreased range of motion, Improper body mechanics, Impaired flexibility, Hypomobility  Visit Diagnosis: Right knee pain, unspecified chronicity  Muscle weakness (generalized)  Difficulty in walking, not elsewhere classified  Joint stiffness of knee, right     Problem List Patient Active Problem List   Diagnosis Date Noted  . S/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,  right 05/20/2019  . H/O total vaginal hysterectomy 03/12/2017  . Essential hypertension 03/08/2017  . Mixed dyslipidemia 03/08/2017  . Abnormal EKG 03/08/2017  . Preoperative cardiovascular examination 03/08/2017  . Allergic state 12/11/2013  . High cholesterol 12/11/2013  . History of depression 12/11/2013  . Hypothyroidism 12/11/2013  . Sleep apnea 12/11/2013   Pura Spice, PT, DPT # [phone] 06/01/2019, 8:42 PM  [place] [address] [place], [place], [address] Phone: [phone]   Fax:  [phone]  Name: Jordan Palmer MRN: [id number] Date of Birth: May 10, 1950

## 2019-06-01 NOTE — Addendum Note (Signed)
Addended by: Pura Spice on: 06/01/2019 08:45 PM   Modules accepted: Orders

## 2019-06-02 ENCOUNTER — Ambulatory Visit: Payer: Medicare Other | Attending: Orthopedic Surgery | Admitting: Physical Therapy

## 2019-06-02 ENCOUNTER — Other Ambulatory Visit: Payer: Self-pay

## 2019-06-02 ENCOUNTER — Encounter: Payer: Self-pay | Admitting: Physical Therapy

## 2019-06-02 DIAGNOSIS — R262 Difficulty in walking, not elsewhere classified: Secondary | ICD-10-CM

## 2019-06-02 DIAGNOSIS — M6281 Muscle weakness (generalized): Secondary | ICD-10-CM | POA: Diagnosis present

## 2019-06-02 DIAGNOSIS — M25561 Pain in right knee: Secondary | ICD-10-CM | POA: Insufficient documentation

## 2019-06-02 DIAGNOSIS — M25661 Stiffness of right knee, not elsewhere classified: Secondary | ICD-10-CM | POA: Insufficient documentation

## 2019-06-02 NOTE — Therapy (Signed)
[place] [facility] [address] [place], [place], [address] Phone: [phone]   Fax:  [phone]  Physical Therapy Treatment  Patient Details  Name: Jordan Palmer MRN: [id number] Date of Birth: 25-Aug-1950 Referring Provider (PT): Dr. Mack Guise   Encounter Date: 06/02/2019  PT End of Session - 06/02/19 1021    Visit Number  2    Number of Visits  9    Date for PT Re-Evaluation  06/25/19    Authorization - Visit Number  2    Authorization - Number of Visits  10    PT Start Time  0812    PT Stop Time  0905    PT Time Calculation (min)  53 min    Activity Tolerance  Patient tolerated treatment well;Patient limited by pain    Behavior During Therapy  [facility] for tasks assessed/performed       Past Medical History:  Diagnosis Date  . Arthritis   . Hyperlipemia   . Hypertension   . Hypothyroidism   . Sleep apnea   . Vertigo     Past Surgical History:  Procedure Laterality Date  . ANTERIOR AND POSTERIOR REPAIR N/A 03/12/2017   Procedure: ANTERIOR (CYSTOCELE) AND POSTERIOR REPAIR (RECTOCELE);  Surgeon: Salvadore Dom, MD;  Location: [facility];  Service: Gynecology;  Laterality: N/A;  . CARPAL TUNNEL RELEASE Bilateral   . CHOLECYSTECTOMY    . CYSTOSCOPY N/A 03/12/2017   Procedure: CYSTOSCOPY;  Surgeon: Salvadore Dom, MD;  Location: [facility];  Service: Gynecology;  Laterality: N/A;  . EYE SURGERY    . KNEE ARTHROSCOPY Left   . KNEE ARTHROSCOPY WITH MEDIAL MENISECTOMY Right 02/13/2019   Procedure: KNEE ARTHROSCOPY WITH MEDIAL MENISECTOMY;  Surgeon: Thornton Park, MD;  Location: [facility];  Service: Orthopedics;  Laterality: Right;  . LACRIMAL DUCT PROBING W/ DACRYOPLASTY Bilateral   . SALPINGOOPHORECTOMY Left 03/12/2017   Procedure: SALPINGECTOMY;  Surgeon: Salvadore Dom, MD;  Location[facility];  Service: Gynecology;  Laterality: Left;  . SHOULDER ARTHROSCOPY WITH  OPEN ROTATOR CUFF REPAIR Right 09/03/2014   Procedure: SHOULDER ARTHROSCOPY WITH OPEN ROTATOR CUFF REPAIR;  Surgeon: Thornton Park, MD;  Location: [facility];  Service: Orthopedics;  Laterality: Right;  . THYROIDECTOMY Right    hemi-thyroidectomy  . TOTAL KNEE ARTHROPLASTY Right 05/20/2019   Procedure: TOTAL KNEE ARTHROPLASTY;  Surgeon: Thornton Park, MD;  Location: [facility];  Service: Orthopedics;  Laterality: Right;  Marland Kitchen VAGINAL HYSTERECTOMY N/A 03/12/2017   Procedure: HYSTERECTOMY VAGINAL;  Surgeon: Salvadore Dom, MD;  Location: [facility];  Service: Gynecology;  Laterality: N/A;    There were no vitals filed for this visit.  Subjective Assessment - 06/02/19 1017    Subjective  Pt. reports good compliance with HEP.  Pt. c/o 8/10 R knee pain, esp. with flexion movement in seated/supine position.  Pt. returns to MD this afternoon for staple removal.    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  Pt. received PT prior to/ after knee scope this year.  See PT notes.    Limitations  Standing;Walking;House hold activities    Patient Stated Goals  Increase R knee ROM/ strength/ improve pain-free walking.    Currently in Pain?  Yes    Pain Score  8     Pain Location  Knee    Pain Orientation  Right    Pain Descriptors / Indicators  Aching;Tightness    Pain Type  Surgical pain       There.ex.:  Reviewed HEP Supine SLR 10x2/ marching 10x2/ heel slides/ quad sets/ SAQ (good technique).  No changes to HEP at this time.   Walking in //-bars with high marching/ increase step pattern/ length.    Manual tx.:  Supine R AA/PROM: flexion/ extension 10x each with increase static holds (pain limited, esp. With flexion).   Patellar mobs. (gentle)- all planes.     PT Long Term Goals - 06/01/19 2038      PT LONG TERM GOAL #1   Title  Pt. will increase FOTO to 49 to improve functional mobility.    Baseline  initial FOTO: 22    Time  4    Period   Weeks    Status  New    Target Date  06/25/19      PT LONG TERM GOAL #2   Title  Pt. will increase R knee AROM (0 to >115 deg.) to improve independence with gait.    Baseline  R knee AROM: -5 to 86 deg.    Time  4    Period  Weeks    Status  New    Target Date  06/25/19      PT LONG TERM GOAL #3   Title  Patient will demonstrate improved strength of BLE as evidenced by 1/2 increase in MMT for increased function at home and in the community.    Baseline  R hip flexion/ abduction strength 4/5 MMT, R quad 4-/5 MMT, R hamstring 4/5 MMT.    Time  4    Period  Weeks    Status  New    Target Date  06/25/19      PT LONG TERM GOAL #4   Title  Pt. able to ambulate with consistent 2-point gait pattern and use of SPC on level surfaces safely.    Baseline  pt. currently amb. with RW    Time  4    Period  Weeks    Status  New    Target Date  06/25/19            Plan - 06/02/19 1023    Clinical Impression Statement  Pt. remains pain limited with R knee flexion >90 deg. in supine position.  Marked improvement noted with supine SLR without assist and able to maintain quad muscle activation/knee extension.  Pts. bandage/ wrap on R knee remains in place until staples removed this afternoon.  Pt. ambulates with continued benefit from use of RW for safety with R LE stance phase of gait.    Personal Factors and Comorbidities  Age;Behavior Pattern;Profession;Past/Current Experience;Time since onset of injury/illness/exacerbation;Fitness;Comorbidity 3+    Comorbidities  HTN, HLD, OA, sleep apnea, vertigo    Examination-Activity Limitations  Squat;Lift;Stairs;Stand;Locomotion Level;Sit;Transfers;Sleep;Bend;Bed Mobility    Examination-Participation Restrictions  Shop;Laundry;Cleaning;Yard Work;Community Activity    Stability/Clinical Decision Making  Evolving/Moderate complexity    Clinical Decision Making  Moderate    Rehab Potential  Good    PT Frequency  2x / week    PT Duration  4 weeks    PT  Treatment/Interventions  ADLs/Self Care Home Management;Cryotherapy;Electrical Stimulation;Moist Heat;Stair training;Gait training;Therapeutic activities;Functional mobility training;Therapeutic exercise;Balance training;Orthotic Fit/Training;Patient/family education;Neuromuscular re-education;Manual techniques;Dry needling;Passive range of motion;Scar mobilization;Taping;Joint Manipulations    PT Next Visit Plan  progress R knee ROM.  Discuss MD appt.    PT Home Exercise Plan  [facility]    Consulted and Agree with Plan of Care  Patient       Patient will benefit from skilled therapeutic intervention in order to improve the following deficits and impairments:  Abnormal gait, Decreased balance, Decreased endurance, Decreased mobility, Difficulty walking, Pain, Postural dysfunction, Decreased strength, Decreased activity tolerance, Decreased range of motion, Improper body mechanics, Impaired flexibility, Hypomobility  Visit Diagnosis: Right knee pain, unspecified chronicity  Muscle weakness (generalized)  Difficulty in walking, not elsewhere classified  Joint stiffness of knee, right     Problem List Patient Active Problem List   Diagnosis Date Noted  . S/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, right 05/20/2019  . H/O total vaginal hysterectomy 03/12/2017  . Essential hypertension 03/08/2017  . Mixed dyslipidemia 03/08/2017  . Abnormal EKG 03/08/2017  . Preoperative cardiovascular examination 03/08/2017  . Allergic state 12/11/2013  . High cholesterol 12/11/2013  . History of depression 12/11/2013  . Hypothyroidism 12/11/2013  . Sleep apnea 12/11/2013   Pura Spice, PT, DPT # [phone] 06/02/2019, 12:37 PM  [facility] [address] [place], [place], [address] Phone: [phone]   Fax:  [phone]  Name: Jordan Palmer MRN: [id number] Date of Birth: Aug 13, 1950

## 2019-06-04 ENCOUNTER — Encounter: Payer: Self-pay | Admitting: Physical Therapy

## 2019-06-04 ENCOUNTER — Other Ambulatory Visit: Payer: Self-pay

## 2019-06-04 ENCOUNTER — Ambulatory Visit: Payer: Medicare Other | Admitting: Physical Therapy

## 2019-06-04 DIAGNOSIS — M25661 Stiffness of right knee, not elsewhere classified: Secondary | ICD-10-CM

## 2019-06-04 DIAGNOSIS — M25561 Pain in right knee: Secondary | ICD-10-CM | POA: Diagnosis not present

## 2019-06-04 DIAGNOSIS — M6281 Muscle weakness (generalized): Secondary | ICD-10-CM

## 2019-06-04 DIAGNOSIS — R262 Difficulty in walking, not elsewhere classified: Secondary | ICD-10-CM

## 2019-06-04 NOTE — Therapy (Signed)
[facility] [address] [place], [place], [address] Phone: [phone]   Fax:  [phone]  Physical Therapy Treatment  Patient Details  Name: Jordan Palmer MRN: [id number] Date of Birth: Oct 16, 1950 Referring Provider (PT): Dr. Mack Guise   Encounter Date: 06/04/2019  PT End of Session - 06/04/19 0825    Visit Number  3    Number of Visits  9    Date for PT Re-Evaluation  06/25/19    Authorization - Visit Number  3    Authorization - Number of Visits  10    PT Start Time  0810    PT Stop Time  0906    PT Time Calculation (min)  56 min    Activity Tolerance  Patient tolerated treatment well;Patient limited by pain    Behavior During Therapy  [facility] for tasks assessed/performed       Past Medical History:  Diagnosis Date  . Arthritis   . Hyperlipemia   . Hypertension   . Hypothyroidism   . Sleep apnea   . Vertigo     Past Surgical History:  Procedure Laterality Date  . ANTERIOR AND POSTERIOR REPAIR N/A 03/12/2017   Procedure: ANTERIOR (CYSTOCELE) AND POSTERIOR REPAIR (RECTOCELE);  Surgeon: Salvadore Dom, MD;  Location: [facility];  Service: Gynecology;  Laterality: N/A;  . CARPAL TUNNEL RELEASE Bilateral   . CHOLECYSTECTOMY    . CYSTOSCOPY N/A 03/12/2017   Procedure: CYSTOSCOPY;  Surgeon: Salvadore Dom, MD;  Location: [facility];  Service: Gynecology;  Laterality: N/A;  . EYE SURGERY    . KNEE ARTHROSCOPY Left   . KNEE ARTHROSCOPY WITH MEDIAL MENISECTOMY Right 02/13/2019   Procedure: KNEE ARTHROSCOPY WITH MEDIAL MENISECTOMY;  Surgeon: Thornton Park, MD;  Location: [facility];  Service: Orthopedics;  Laterality: Right;  . LACRIMAL DUCT PROBING W/ DACRYOPLASTY Bilateral   . SALPINGOOPHORECTOMY Left 03/12/2017   Procedure: SALPINGECTOMY;  Surgeon: Salvadore Dom, MD;  Location: [facility];  Service: Gynecology;  Laterality: Left;  . SHOULDER ARTHROSCOPY WITH  OPEN ROTATOR CUFF REPAIR Right 09/03/2014   Procedure: SHOULDER ARTHROSCOPY WITH OPEN ROTATOR CUFF REPAIR;  Surgeon: Thornton Park, MD;  Location: [facility];  Service: Orthopedics;  Laterality: Right;  . THYROIDECTOMY Right    hemi-thyroidectomy  . TOTAL KNEE ARTHROPLASTY Right 05/20/2019   Procedure: TOTAL KNEE ARTHROPLASTY;  Surgeon: Thornton Park, MD;  Location: [facility];  Service: Orthopedics;  Laterality: Right;  Marland Kitchen VAGINAL HYSTERECTOMY N/A 03/12/2017   Procedure: HYSTERECTOMY VAGINAL;  Surgeon: Salvadore Dom, MD;  Location: [facility];  Service: Gynecology;  Laterality: N/A;    There were no vitals filed for this visit.  Subjective Assessment - 06/04/19 0812    Subjective  Pt. reports 6/10 R knee pain prior to tx. session.  Pts. MD happy with progress and removed staples.  Pt. has bandage in place until tomorrow.  Pt. continues to use RW with gait.    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  Pt. received PT prior to/ after knee scope this year.  See PT notes.    Limitations  Standing;Walking;House hold activities    Patient Stated Goals  Increase R knee ROM/ strength/ improve pain-free walking.    Currently in Pain?  Yes    Pain Score  6     Pain Location  Knee    Pain Orientation  Right  Pain Descriptors / Indicators  Aching    Pain Type  Surgical pain       Manual tx.:  Supine R knee extension/ flexion AA/PROM 8x each with static holds. Focus on R knee flexion AA/PROM with STM to distal quad musculature during holds Patellar mobs. (all planes)- limited by bandage  There.ex.:  Supine SLR/ marching/ SAQ/ heel slides/ quad sets 10x2.  Standing marching/ lateral walking/ knee flexion/ heel raises 20x in //-bars. Amb. In //-bars with L UE only and consistent 2-point gait pattern, progressing to [facility] outside of //-bars.  Ascend stairs with recip. Pattern/ descending with step to pattern (UE assist) Nustep L4 10 min.  (seate position 6-4).    Pt. Will ice at home.    PT Long Term Goals - 06/01/19 2038      PT LONG TERM GOAL #1   Title  Pt. will increase FOTO to 49 to improve functional mobility.    Baseline  initial FOTO: 22    Time  4    Period  Weeks    Status  New    Target Date  06/25/19      PT LONG TERM GOAL #2   Title  Pt. will increase R knee AROM (0 to >115 deg.) to improve independence with gait.    Baseline  R knee AROM: -5 to 86 deg.    Time  4    Period  Weeks    Status  New    Target Date  06/25/19      PT LONG TERM GOAL #3   Title  Patient will demonstrate improved strength of BLE as evidenced by 1/2 increase in MMT for increased function at home and in the community.    Baseline  R hip flexion/ abduction strength 4/5 MMT, R quad 4-/5 MMT, R hamstring 4/5 MMT.    Time  4    Period  Weeks    Status  New    Target Date  06/25/19      PT LONG TERM GOAL #4   Title  Pt. able to ambulate with consistent 2-point gait pattern and use of SPC on level surfaces safely.    Baseline  pt. currently amb. with RW    Time  4    Period  Weeks    Status  New    Target Date  06/25/19         Plan - 06/04/19 0906    Clinical Impression Statement  Pt. ambulates with marked improvement in more normalized recip. 2-point gait pattern with use of SPC.  Pt. has more consistent heel strike and knee flexion with swing through phase of gait while using SPC.  Pt. demonstrates recip. step pattern while ascending stairs and step gait with descending.  No LOB during tx. session and increase R knee flexion 98 deg.  Pt. remains pain limited/ guarded during supine R knee stretches, esp. flexion.    Personal Factors and Comorbidities  Age;Behavior Pattern;Profession;Past/Current Experience;Time since onset of injury/illness/exacerbation;Fitness;Comorbidity 3+    Comorbidities  HTN, HLD, OA, sleep apnea, vertigo    Examination-Activity Limitations  Squat;Lift;Stairs;Stand;Locomotion  Level;Sit;Transfers;Sleep;Bend;Bed Mobility    Examination-Participation Restrictions  Shop;Laundry;Cleaning;Yard Work;Community Activity    Stability/Clinical Decision Making  Evolving/Moderate complexity    Clinical Decision Making  Moderate    Rehab Potential  Good    PT Frequency  2x / week    PT Duration  4 weeks    PT Treatment/Interventions  ADLs/Self Care Home Management;Cryotherapy;Electrical Stimulation;Moist Heat;Stair  training;Gait training;Therapeutic activities;Functional mobility training;Therapeutic exercise;Balance training;Orthotic Fit/Training;Patient/family education;Neuromuscular re-education;Manual techniques;Dry needling;Passive range of motion;Scar mobilization;Taping;Joint Manipulations    PT Next Visit Plan  progress R knee ROM.  Issue new HEP    PT Home Exercise Plan  [facility]    Consulted and Agree with Plan of Care  Patient       Patient will benefit from skilled therapeutic intervention in order to improve the following deficits and impairments:  Abnormal gait, Decreased balance, Decreased endurance, Decreased mobility, Difficulty walking, Pain, Postural dysfunction, Decreased strength, Decreased activity tolerance, Decreased range of motion, Improper body mechanics, Impaired flexibility, Hypomobility  Visit Diagnosis: Right knee pain, unspecified chronicity  Muscle weakness (generalized)  Difficulty in walking, not elsewhere classified  Joint stiffness of knee, right     Problem List Patient Active Problem List   Diagnosis Date Noted  . S/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, right 05/20/2019  . H/O total vaginal hysterectomy 03/12/2017  . Essential hypertension 03/08/2017  . Mixed dyslipidemia 03/08/2017  . Abnormal EKG 03/08/2017  . Preoperative cardiovascular examination 03/08/2017  . Allergic state 12/11/2013  . High cholesterol 12/11/2013  . History of depression 12/11/2013  . Hypothyroidism 12/11/2013  . Sleep apnea 12/11/2013    Pura Spice, PT, DPT # [phone] 06/04/2019, 9:10 AM  [facility] [address] [place], [place], [address] Phone: [phone]   Fax:  [phone]  Name: Jordan Palmer MRN: [id number] Date of Birth: 09-09-1950

## 2019-06-09 ENCOUNTER — Ambulatory Visit: Payer: Medicare Other | Admitting: Physical Therapy

## 2019-06-09 ENCOUNTER — Other Ambulatory Visit: Payer: Self-pay

## 2019-06-09 DIAGNOSIS — M25561 Pain in right knee: Secondary | ICD-10-CM | POA: Diagnosis not present

## 2019-06-09 DIAGNOSIS — R262 Difficulty in walking, not elsewhere classified: Secondary | ICD-10-CM

## 2019-06-09 DIAGNOSIS — M25661 Stiffness of right knee, not elsewhere classified: Secondary | ICD-10-CM

## 2019-06-09 DIAGNOSIS — M6281 Muscle weakness (generalized): Secondary | ICD-10-CM

## 2019-06-09 NOTE — Therapy (Signed)
[facility] [address] [place], [place], [address] Phone: [phone]   Fax:  [phone]  Physical Therapy Treatment  Patient Details  Name: Jordan Palmer MRN: [id number] Date of Birth: 02/10/1950 Referring Provider (PT): Dr. Mack Guise   Encounter Date: 06/09/2019  PT End of Session - 06/09/19 1857    Visit Number  4    Number of Visits  9    Date for PT Re-Evaluation  06/25/19    Authorization - Visit Number  4    Authorization - Number of Visits  10    PT Start Time  0811    PT Stop Time  0902    PT Time Calculation (min)  51 min    Activity Tolerance  Patient tolerated treatment well;Patient limited by pain    Behavior During Therapy  [facility] for tasks assessed/performed       Past Medical History:  Diagnosis Date  . Arthritis   . Hyperlipemia   . Hypertension   . Hypothyroidism   . Sleep apnea   . Vertigo     Past Surgical History:  Procedure Laterality Date  . ANTERIOR AND POSTERIOR REPAIR N/A 03/12/2017   Procedure: ANTERIOR (CYSTOCELE) AND POSTERIOR REPAIR (RECTOCELE);  Surgeon: Salvadore Dom, MD;  Location: [facility];  Service: Gynecology;  Laterality: N/A;  . CARPAL TUNNEL RELEASE Bilateral   . CHOLECYSTECTOMY    . CYSTOSCOPY N/A 03/12/2017   Procedure: CYSTOSCOPY;  Surgeon: Salvadore Dom, MD;  Location: [facility];  Service: Gynecology;  Laterality: N/A;  . EYE SURGERY    . KNEE ARTHROSCOPY Left   . KNEE ARTHROSCOPY WITH MEDIAL MENISECTOMY Right 02/13/2019   Procedure: KNEE ARTHROSCOPY WITH MEDIAL MENISECTOMY;  Surgeon: Thornton Park, MD;  Location: [facility];  Service: Orthopedics;  Laterality: Right;  . LACRIMAL DUCT PROBING W/ DACRYOPLASTY Bilateral   . SALPINGOOPHORECTOMY Left 03/12/2017   Procedure: SALPINGECTOMY;  Surgeon: Salvadore Dom, MD;  Location: [facility];  Service: Gynecology;  Laterality: Left;  . SHOULDER ARTHROSCOPY WITH  OPEN ROTATOR CUFF REPAIR Right 09/03/2014   Procedure: SHOULDER ARTHROSCOPY WITH OPEN ROTATOR CUFF REPAIR;  Surgeon: Thornton Park, MD;  Location: [facility];  Service: Orthopedics;  Laterality: Right;  . THYROIDECTOMY Right    hemi-thyroidectomy  . TOTAL KNEE ARTHROPLASTY Right 05/20/2019   Procedure: TOTAL KNEE ARTHROPLASTY;  Surgeon: Thornton Park, MD;  Location: [facility];  Service: Orthopedics;  Laterality: Right;  Marland Kitchen VAGINAL HYSTERECTOMY N/A 03/12/2017   Procedure: HYSTERECTOMY VAGINAL;  Surgeon: Salvadore Dom, MD;  Location: [facility];  Service: Gynecology;  Laterality: N/A;    There were no vitals filed for this visit.  Subjective Assessment - 06/09/19 1855    Subjective  Pt. entered PT with use of SPC today.  Pt. states she took her pain pill prior to PT and pain in R knee is still 4/10.  Pt. states she is still sleeping in chair and is planning to return to bed tonight.    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  Pt. received PT prior to/ after knee scope this year.  See PT notes.    Limitations  Standing;Walking;House hold activities    Patient Stated Goals  Increase R knee ROM/ strength/ improve pain-free walking.    Currently in Pain?  Yes    Pain Score  4     Pain Location  Knee  Pain Orientation  Right    Pain Descriptors / Indicators  Aching        There.ex.:  Nustep L4 10 min. (seate position 5-4).   Standing marching/ lateral walking/ knee flexion/ heel raises 20x in //-bars. 2nd R knee flexion 10x with static holds/ gastroc stretches at 1st step with UE support.   Supine SLR/ marching/ SAQ/ heel slides/ quad sets 10x2.  Amb. In //-bars without UE assist in //-bars and mirror feedback working on [employer] consistent step pattern.    Manual tx.:  Supine R knee extension/ flexion AA/PROM 6x each with static holds. Focus on R knee flexion AA/PROM with STM to distal quad musculature during holds Assessment of  incision with steristrips in place Patellar mobs. (all planes)- pain limited with superior/ inferior direction.     Pt. Will ice at home.    PT Long Term Goals - 06/01/19 2038      PT LONG TERM GOAL #1   Title  Pt. will increase FOTO to 49 to improve functional mobility.    Baseline  initial FOTO: 22    Time  4    Period  Weeks    Status  New    Target Date  06/25/19      PT LONG TERM GOAL #2   Title  Pt. will increase R knee AROM (0 to >115 deg.) to improve independence with gait.    Baseline  R knee AROM: -5 to 86 deg.    Time  4    Period  Weeks    Status  New    Target Date  06/25/19      PT LONG TERM GOAL #3   Title  Patient will demonstrate improved strength of BLE as evidenced by 1/2 increase in MMT for increased function at home and in the community.    Baseline  R hip flexion/ abduction strength 4/5 MMT, R quad 4-/5 MMT, R hamstring 4/5 MMT.    Time  4    Period  Weeks    Status  New    Target Date  06/25/19      PT LONG TERM GOAL #4   Title  Pt. able to ambulate with consistent 2-point gait pattern and use of SPC on level surfaces safely.    Baseline  pt. currently amb. with RW    Time  4    Period  Weeks    Status  New    Target Date  06/25/19            Plan - 06/09/19 1857    Clinical Impression Statement  Pt. ambulates with more consistent gait pattern with use of SPC in clinic.  Pt. demonstrates slight R antalgic gait while ambulating in //-bars with no UE assist.  Increase R knee AROM after stretching: 0 to 104 deg. (pain limited).  Good incision healing with scabbing/ steristrips in place.  Slight warmth around knee with no redness noted.    Personal Factors and Comorbidities  Age;Behavior Pattern;Profession;Past/Current Experience;Time since onset of injury/illness/exacerbation;Fitness;Comorbidity 3+    Comorbidities  HTN, HLD, OA, sleep apnea, vertigo    Examination-Activity Limitations  Squat;Lift;Stairs;Stand;Locomotion  Level;Sit;Transfers;Sleep;Bend;Bed Mobility    Examination-Participation Restrictions  Shop;Laundry;Cleaning;Yard Work;Community Activity    Stability/Clinical Decision Making  Evolving/Moderate complexity    Clinical Decision Making  Moderate    Rehab Potential  Good    PT Frequency  2x / week    PT Duration  4 weeks    PT Treatment/Interventions  ADLs/Self Care Home Management;Cryotherapy;Electrical Stimulation;Moist Heat;Stair training;Gait training;Therapeutic activities;Functional mobility training;Therapeutic exercise;Balance training;Orthotic Fit/Training;Patient/family education;Neuromuscular re-education;Manual techniques;Dry needling;Passive range of motion;Scar mobilization;Taping;Joint Manipulations    PT Next Visit Plan  progress R knee ROM.  Issue new HEP (standing ex.).    PT Home Exercise Plan  [facility]    Consulted and Agree with Plan of Care  Patient       Patient will benefit from skilled therapeutic intervention in order to improve the following deficits and impairments:  Abnormal gait, Decreased balance, Decreased endurance, Decreased mobility, Difficulty walking, Pain, Postural dysfunction, Decreased strength, Decreased activity tolerance, Decreased range of motion, Improper body mechanics, Impaired flexibility, Hypomobility  Visit Diagnosis: Right knee pain, unspecified chronicity  Muscle weakness (generalized)  Difficulty in walking, not elsewhere classified  Joint stiffness of knee, right     Problem List Patient Active Problem List   Diagnosis Date Noted  . S/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, right 05/20/2019  . H/O total vaginal hysterectomy 03/12/2017  . Essential hypertension 03/08/2017  . Mixed dyslipidemia 03/08/2017  . Abnormal EKG 03/08/2017  . Preoperative cardiovascular examination 03/08/2017  . Allergic state 12/11/2013  . High cholesterol 12/11/2013  . History of depression 12/11/2013  . Hypothyroidism 12/11/2013  . Sleep apnea  12/11/2013   Pura Spice, PT, DPT # [phone] 06/09/2019, 7:03 PM  [facility] [address] [place], [place], [address] Phone: [phone]   Fax:  [phone]  Name: Jordan Palmer MRN: [id number] Date of Birth: 1950/12/29

## 2019-06-11 ENCOUNTER — Encounter: Payer: Self-pay | Admitting: Physical Therapy

## 2019-06-11 ENCOUNTER — Ambulatory Visit: Payer: Medicare Other | Admitting: Physical Therapy

## 2019-06-11 ENCOUNTER — Other Ambulatory Visit: Payer: Self-pay

## 2019-06-11 DIAGNOSIS — M25561 Pain in right knee: Secondary | ICD-10-CM | POA: Diagnosis not present

## 2019-06-11 DIAGNOSIS — R262 Difficulty in walking, not elsewhere classified: Secondary | ICD-10-CM

## 2019-06-11 DIAGNOSIS — M25661 Stiffness of right knee, not elsewhere classified: Secondary | ICD-10-CM

## 2019-06-11 DIAGNOSIS — M6281 Muscle weakness (generalized): Secondary | ICD-10-CM

## 2019-06-11 NOTE — Patient Instructions (Signed)
Access Code: [email or web address]: [email or web address]: 04/28/2021Prepared by: Legrand Como SherkExercises  Supine Quad Set - 2 x daily - 7 x weekly - 1 sets - 10 reps - 10 seconds hold  Supine Heel Slide with Strap - 2 x daily - 7 x weekly - 1 sets - 10 reps  Standing March - 2 x daily - 7 x weekly - 2 sets - 10 reps  Standing Hip Abduction with Counter Support - 2 x daily - 7 x weekly - 2 sets - 10 reps  Standing Heel Raises - 2 x daily - 7 x weekly - 2 sets - 10 reps  Standing Hip Extension with Counter Support - 1 x daily - 7 x weekly - 2 sets - 10 reps  Sit to Stand - 1 x daily - 7 x weekly - 1 sets - 10 reps

## 2019-06-11 NOTE — Therapy (Signed)
[facility] [address] [place], [place], [address] Phone: [phone]   Fax:  [phone]  Physical Therapy Treatment  Patient Details  Name: LAKELEE DEVERAUX MRN: [id number] Date of Birth: 01-10-1951 Referring Provider (PT): Dr. Mack Guise   Encounter Date: 06/11/2019  PT End of Session - 06/11/19 0821    Visit Number  5    Number of Visits  9    Date for PT Re-Evaluation  06/25/19    Authorization - Visit Number  5    Authorization - Number of Visits  10    PT Start Time  0812    PT Stop Time  0906    PT Time Calculation (min)  54 min    Activity Tolerance  Patient tolerated treatment well;Patient limited by pain    Behavior During Therapy  [facility] for tasks assessed/performed       Past Medical History:  Diagnosis Date  . Arthritis   . Hyperlipemia   . Hypertension   . Hypothyroidism   . Sleep apnea   . Vertigo     Past Surgical History:  Procedure Laterality Date  . ANTERIOR AND POSTERIOR REPAIR N/A 03/12/2017   Procedure: ANTERIOR (CYSTOCELE) AND POSTERIOR REPAIR (RECTOCELE);  Surgeon: Salvadore Dom, MD;  [facility];  Service: Gynecology;  Laterality: N/A;  . CARPAL TUNNEL RELEASE Bilateral   . CHOLECYSTECTOMY    . CYSTOSCOPY N/A 03/12/2017   Procedure: CYSTOSCOPY;  Surgeon: Salvadore Dom, MD;  Location: [facility];  Service: Gynecology;  Laterality: N/A;  . EYE SURGERY    . KNEE ARTHROSCOPY Left   . KNEE ARTHROSCOPY WITH MEDIAL MENISECTOMY Right 02/13/2019   Procedure: KNEE ARTHROSCOPY WITH MEDIAL MENISECTOMY;  Surgeon: Thornton Park, MD;  Location: [facility];  Service: Orthopedics;  Laterality: Right;  . LACRIMAL DUCT PROBING W/ DACRYOPLASTY Bilateral   . SALPINGOOPHORECTOMY Left 03/12/2017   Procedure: SALPINGECTOMY;  Surgeon: Salvadore Dom, MD;  Location: [facility];  Service: Gynecology;  Laterality: Left;  . SHOULDER ARTHROSCOPY WITH  OPEN ROTATOR CUFF REPAIR Right 09/03/2014   Procedure: SHOULDER ARTHROSCOPY WITH OPEN ROTATOR CUFF REPAIR;  Surgeon: Thornton Park, MD;  Location: [facility];  Service: Orthopedics;  Laterality: Right;  . THYROIDECTOMY Right    hemi-thyroidectomy  . TOTAL KNEE ARTHROPLASTY Right 05/20/2019   Procedure: TOTAL KNEE ARTHROPLASTY;  Surgeon: Thornton Park, MD;  Location: [facility];  Service: Orthopedics;  Laterality: Right;  Marland Kitchen VAGINAL HYSTERECTOMY N/A 03/12/2017   Procedure: HYSTERECTOMY VAGINAL;  Surgeon: Salvadore Dom, MD;  Location: [facility];  Service: Gynecology;  Laterality: N/A;    There were no vitals filed for this visit.  Subjective Assessment - 06/11/19 0820    Subjective  Pt. reports increase R knee/ quad soreness after last tx.  Pt. reports 6/10 R knee discomfort this morning.  Pt. entered PT with use of SPC.    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  Pt. received PT prior to/ after knee scope this year.  See PT notes.    Limitations  Standing;Walking;House hold activities    Patient Stated Goals  Increase R knee ROM/ strength/ improve pain-free walking.    Currently in Pain?  Yes    Pain Score  6     Pain Location  Knee    Pain Orientation  Right          There.ex.:  Nustep L4 10 min. (seat position 6-5).  No UE for last minute.    6" step ups/ down on R LE 20x in //-bars (mirror feedback).   Standing hip ex.  (see updated HEP) - no resistance TRX sit to stands 10x2.    Amb. In //-bars and PT clinic without UE assist and mirror feedback working on posture/ consistent step pattern.   Manual tx.:  Supine R knee flexion AA/PROM 10x each with static holds. Patellar mobs. (all planes)- pain limited with superior/ inferior direction.   Good incision healing.  Steristrips in place.   Pt. Will ice at home.    PT Long Term Goals - 06/01/19 2038      PT LONG TERM GOAL #1   Title  Pt. will increase FOTO to 49  to improve functional mobility.    Baseline  initial FOTO: 22    Time  4    Period  Weeks    Status  New    Target Date  06/25/19      PT LONG TERM GOAL #2   Title  Pt. will increase R knee AROM (0 to >115 deg.) to improve independence with gait.    Baseline  R knee AROM: -5 to 86 deg.    Time  4    Period  Weeks    Status  New    Target Date  06/25/19      PT LONG TERM GOAL #3   Title  Patient will demonstrate improved strength of BLE as evidenced by 1/2 increase in MMT for increased function at home and in the community.    Baseline  R hip flexion/ abduction strength 4/5 MMT, R quad 4-/5 MMT, R hamstring 4/5 MMT.    Time  4    Period  Weeks    Status  New    Target Date  06/25/19      PT LONG TERM GOAL #4   Title  Pt. able to ambulate with consistent 2-point gait pattern and use of SPC on level surfaces safely.    Baseline  pt. currently amb. with RW    Time  4    Period  Weeks    Status  New    Target Date  06/25/19            Plan - 06/11/19 [id number]    Clinical Impression Statement  Pt. continues to progress with standing ther.ex./ R knee ROM during functional tasks.  Pt. able to step up with R LE only and good midline knee position (no increase pain).  Good quad control with sit to stands/ walking in clinic.  Pt. does benefit from feedback during sit to stands to prevent L lateral leaning.  Pt. able to demonstrate symmetrical sit to stands with use of R LE.  Pain limited R knee flexion with supine/ seated activites.  See updated HEP.    Personal Factors and Comorbidities  Age;Behavior Pattern;Profession;Past/Current Experience;Time since onset of injury/illness/exacerbation;Fitness;Comorbidity 3+    Comorbidities  HTN, HLD, OA, sleep apnea, vertigo    Examination-Activity Limitations  Squat;Lift;Stairs;Stand;Locomotion Level;Sit;Transfers;Sleep;Bend;Bed Mobility    Examination-Participation Restrictions  Shop;Laundry;Cleaning;Yard Work;Community Activity     Stability/Clinical Decision Making  Evolving/Moderate complexity    Clinical Decision Making  Moderate    Rehab Potential  Good    PT Frequency  2x / week    PT Duration  4 weeks    PT Treatment/Interventions  ADLs/Self Care Home Management;Cryotherapy;Electrical Stimulation;Moist Heat;Stair training;Gait training;Therapeutic activities;Functional mobility training;Therapeutic  exercise;Balance training;Orthotic Fit/Training;Patient/family education;Neuromuscular re-education;Manual techniques;Dry needling;Passive range of motion;Scar mobilization;Taping;Joint Manipulations    PT Next Visit Plan  progress R knee ROM.  Reassess ROM.    PT Home Exercise Plan  [facility]    Consulted and Agree with Plan of Care  Patient       Patient will benefit from skilled therapeutic intervention in order to improve the following deficits and impairments:  Abnormal gait, Decreased balance, Decreased endurance, Decreased mobility, Difficulty walking, Pain, Postural dysfunction, Decreased strength, Decreased activity tolerance, Decreased range of motion, Improper body mechanics, Impaired flexibility, Hypomobility  Visit Diagnosis: Right knee pain, unspecified chronicity  Muscle weakness (generalized)  Difficulty in walking, not elsewhere classified  Joint stiffness of knee, right     Problem List Patient Active Problem List   Diagnosis Date Noted  . S/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, right 05/20/2019  . H/O total vaginal hysterectomy 03/12/2017  . Essential hypertension 03/08/2017  . Mixed dyslipidemia 03/08/2017  . Abnormal EKG 03/08/2017  . Preoperative cardiovascular examination 03/08/2017  . Allergic state 12/11/2013  . High cholesterol 12/11/2013  . History of depression 12/11/2013  . Hypothyroidism 12/11/2013  . Sleep apnea 12/11/2013   Pura Spice, PT, DPT # [phone] 06/11/2019, 12:15 PM  [facility] [employer] [address] [place], [place], [address] Phone: [phone]   Fax:  [phone]  Name: YEILY KOCUR MRN: [id number] Date of Birth: [birth year]

## 2019-06-16 ENCOUNTER — Encounter: Payer: Self-pay | Admitting: Physical Therapy

## 2019-06-16 ENCOUNTER — Other Ambulatory Visit: Payer: Self-pay

## 2019-06-16 ENCOUNTER — Ambulatory Visit: Payer: Medicare Other | Admitting: Physical Therapy

## 2019-06-16 DIAGNOSIS — M25661 Stiffness of right knee, not elsewhere classified: Secondary | ICD-10-CM

## 2019-06-16 DIAGNOSIS — M25561 Pain in right knee: Secondary | ICD-10-CM | POA: Diagnosis not present

## 2019-06-16 DIAGNOSIS — M6281 Muscle weakness (generalized): Secondary | ICD-10-CM

## 2019-06-16 DIAGNOSIS — R262 Difficulty in walking, not elsewhere classified: Secondary | ICD-10-CM

## 2019-06-16 NOTE — Therapy (Signed)
[facility] [address]. [place], [place], [address] Phone: [phone]   Fax:  [phone]  Physical Therapy Treatment  Patient Details  Name: Jordan Palmer MRN: [id number] Date of Birth: August 13, 1950 Referring Provider (PT): Dr. Mack Guise   Encounter Date: 06/16/2019  PT End of Session - 06/16/19 1237    Visit Number  6    Number of Visits  9    Date for PT Re-Evaluation  06/25/19    Authorization - Visit Number  6    Authorization - Number of Visits  10    PT Start Time  0813    PT Stop Time  0903    PT Time Calculation (min)  50 min    Activity Tolerance  Patient tolerated treatment well;Patient limited by pain    Behavior During Therapy  [facility] for tasks assessed/performed       Past Medical History:  Diagnosis Date  . Arthritis   . Hyperlipemia   . Hypertension   . Hypothyroidism   . Sleep apnea   . Vertigo     Past Surgical History:  Procedure Laterality Date  . ANTERIOR AND POSTERIOR REPAIR N/A 03/12/2017   Procedure: ANTERIOR (CYSTOCELE) AND POSTERIOR REPAIR (RECTOCELE);  Surgeon: Salvadore Dom, MD;  Location: [facility];  Service: Gynecology;  Laterality: N/A;  . CARPAL TUNNEL RELEASE Bilateral   . CHOLECYSTECTOMY    . CYSTOSCOPY N/A 03/12/2017   Procedure: CYSTOSCOPY;  Surgeon: Salvadore Dom, MD;  Location: [facility];  Service: Gynecology;  Laterality: N/A;  . EYE SURGERY    . KNEE ARTHROSCOPY Left   . KNEE ARTHROSCOPY WITH MEDIAL MENISECTOMY Right 02/13/2019   Procedure: KNEE ARTHROSCOPY WITH MEDIAL MENISECTOMY;  Surgeon: Thornton Park, MD;  Location: [facility];  Service: Orthopedics;  Laterality: Right;  . LACRIMAL DUCT PROBING W/ DACRYOPLASTY Bilateral   . SALPINGOOPHORECTOMY Left 03/12/2017   Procedure: SALPINGECTOMY;  Surgeon: Salvadore Dom, MD;  Location: [facility];  Service: Gynecology;  Laterality: Left;  . SHOULDER ARTHROSCOPY WITH  OPEN ROTATOR CUFF REPAIR Right 09/03/2014   Procedure: SHOULDER ARTHROSCOPY WITH OPEN ROTATOR CUFF REPAIR;  Surgeon: Thornton Park, MD;  Location: [facility];  Service: Orthopedics;  Laterality: Right;  . THYROIDECTOMY Right    hemi-thyroidectomy  . TOTAL KNEE ARTHROPLASTY Right 05/20/2019   Procedure: TOTAL KNEE ARTHROPLASTY;  Surgeon: Thornton Park, MD;  Location: [facility];  Service: Orthopedics;  Laterality: Right;  Marland Kitchen VAGINAL HYSTERECTOMY N/A 03/12/2017   Procedure: HYSTERECTOMY VAGINAL;  Surgeon: Salvadore Dom, MD;  Location: [facility];  Service: Gynecology;  Laterality: N/A;    There were no vitals filed for this visit.  Subjective Assessment - 06/16/19 0810    Subjective  Pt. entered PT with use of SPC on L and reports of 5/10.  Pt. states she tried sleeping in bed but was difficult because she rolled over onto R knee.  Pt. had questions about returning to driving.  PT states pt. can not be taking pain meds and would recommend testing out R knee with car in a parking lot.  Pt. returns to MD for f/u next week.    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  Pt. received PT prior to/ after knee scope this year.  See PT notes.    Limitations  Standing;Walking;House hold activities    Patient Stated Goals  Increase R knee ROM/ strength/  improve pain-free walking.    Currently in Pain?  Yes    Pain Score  5     Pain Location  Knee    Pain Orientation  Right    Pain Descriptors / Indicators  Aching          There.ex.:  Nustep L4 10 min. (seat position5)- no increase knee pain.     Reviewed standing HEP.  Amb. In //-bars with increase hip flexion/ step pattern (forward/ backwards). Lateral walking in //-bars 5x.  Recip. Stair climbing with UE assist and focus on descending/ R knee eccentric quad control.   Discuss aquatic ex. At pts. Home pool.    Manual tx.:  Supine R knee flexion AA/PROM7x each with static  holds. Patellar mobs. (all planes)-pain limited with superior/ inferior direction. 3 steristrips in place (good incision healing).  Pt. Will ice at home.    PT Long Term Goals - 06/01/19 2038      PT LONG TERM GOAL #1   Title  Pt. will increase FOTO to 49 to improve functional mobility.    Baseline  initial FOTO: 22    Time  4    Period  Weeks    Status  New    Target Date  06/25/19      PT LONG TERM GOAL #2   Title  Pt. will increase R knee AROM (0 to >115 deg.) to improve independence with gait.    Baseline  R knee AROM: -5 to 86 deg.    Time  4    Period  Weeks    Status  New    Target Date  06/25/19      PT LONG TERM GOAL #3   Title  Patient will demonstrate improved strength of BLE as evidenced by 1/2 increase in MMT for increased function at home and in the community.    Baseline  R hip flexion/ abduction strength 4/5 MMT, R quad 4-/5 MMT, R hamstring 4/5 MMT.    Time  4    Period  Weeks    Status  New    Target Date  06/25/19      PT LONG TERM GOAL #4   Title  Pt. able to ambulate with consistent 2-point gait pattern and use of SPC on level surfaces safely.    Baseline  pt. currently amb. with RW    Time  4    Period  Weeks    Status  New    Target Date  06/25/19            Plan - 06/16/19 1241    Clinical Impression Statement  Pt. remains pain limited wtih R knee flexion (107 deg.)- significant increase in pain.  PT recommends pt. focus on knee flexion with increase static holds, not extension at this time.  Pt. continues to progress to more normalized gait pattern without use of SPC and pt. will wean off SPC at home.  Pt. will continue with use of SPC outside.  Pt. able to demonstrate recip. pattern on stairs with increase R knee discomfort during descending (eccentric quad control).    Personal Factors and Comorbidities  Age;Behavior Pattern;Profession;Past/Current Experience;Time since onset of injury/illness/exacerbation;Fitness;Comorbidity 3+     Comorbidities  HTN, HLD, OA, sleep apnea, vertigo    Examination-Activity Limitations  Squat;Lift;Stairs;Stand;Locomotion Level;Sit;Transfers;Sleep;Bend;Bed Mobility    Examination-Participation Restrictions  Shop;Laundry;Cleaning;Yard Work;Community Activity    Stability/Clinical Decision Making  Evolving/Moderate complexity    Clinical Decision Making  Moderate  Rehab Potential  Good    PT Frequency  2x / week    PT Duration  4 weeks    PT Treatment/Interventions  ADLs/Self Care Home Management;Cryotherapy;Electrical Stimulation;Moist Heat;Stair training;Gait training;Therapeutic activities;Functional mobility training;Therapeutic exercise;Balance training;Orthotic Fit/Training;Patient/family education;Neuromuscular re-education;Manual techniques;Dry needling;Passive range of motion;Scar mobilization;Taping;Joint Manipulations    PT Next Visit Plan  progress R knee flexion.  Send MD progress note next tx. session.    PT Home Exercise Plan  [facility]    Consulted and Agree with Plan of Care  Patient       Patient will benefit from skilled therapeutic intervention in order to improve the following deficits and impairments:  Abnormal gait, Decreased balance, Decreased endurance, Decreased mobility, Difficulty walking, Pain, Postural dysfunction, Decreased strength, Decreased activity tolerance, Decreased range of motion, Improper body mechanics, Impaired flexibility, Hypomobility  Visit Diagnosis: Right knee pain, unspecified chronicity  Muscle weakness (generalized)  Difficulty in walking, not elsewhere classified  Joint stiffness of knee, right     Problem List Patient Active Problem List   Diagnosis Date Noted  . S/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, right 05/20/2019  . H/O total vaginal hysterectomy 03/12/2017  . Essential hypertension 03/08/2017  . Mixed dyslipidemia 03/08/2017  . Abnormal EKG 03/08/2017  . Preoperative cardiovascular examination 03/08/2017  .  Allergic state 12/11/2013  . High cholesterol 12/11/2013  . History of depression 12/11/2013  . Hypothyroidism 12/11/2013  . Sleep apnea 12/11/2013   Pura Spice, PT, DPT # [phone] 06/16/2019, 4:52 PM  [facility] [address] [place], [place], [address] Phone: [phone]   Fax:  [phone]  Name: Jordan Palmer MRN: [id number] Date of Birth: 14-Aug-1950

## 2019-06-18 ENCOUNTER — Encounter: Payer: Self-pay | Admitting: Physical Therapy

## 2019-06-18 ENCOUNTER — Other Ambulatory Visit: Payer: Self-pay

## 2019-06-18 ENCOUNTER — Ambulatory Visit: Payer: Medicare Other | Admitting: Physical Therapy

## 2019-06-18 DIAGNOSIS — M25561 Pain in right knee: Secondary | ICD-10-CM

## 2019-06-18 DIAGNOSIS — M25661 Stiffness of right knee, not elsewhere classified: Secondary | ICD-10-CM

## 2019-06-18 DIAGNOSIS — R262 Difficulty in walking, not elsewhere classified: Secondary | ICD-10-CM

## 2019-06-18 DIAGNOSIS — M6281 Muscle weakness (generalized): Secondary | ICD-10-CM

## 2019-06-18 NOTE — Therapy (Signed)
[facility] [address] [place], [place], [address] Phone: [phone]   Fax:  [phone]  Physical Therapy Treatment  Patient Details  Name: Jordan Palmer MRN: [id number] Date of Birth: 11-Feb-1950 Referring Provider (PT): Dr. Mack Guise   Encounter Date: 06/18/2019  PT End of Session - 06/18/19 1243    Visit Number  7    Number of Visits  9    Date for PT Re-Evaluation  06/25/19    Authorization - Visit Number  7    Authorization - Number of Visits  10    PT Start Time  0810    PT Stop Time  0901    PT Time Calculation (min)  51 min    Activity Tolerance  Patient tolerated treatment well;Patient limited by pain    Behavior During Therapy  [facility] for tasks assessed/performed       Past Medical History:  Diagnosis Date  . Arthritis   . Hyperlipemia   . Hypertension   . Hypothyroidism   . Sleep apnea   . Vertigo     Past Surgical History:  Procedure Laterality Date  . ANTERIOR AND POSTERIOR REPAIR N/A 03/12/2017   Procedure: ANTERIOR (CYSTOCELE) AND POSTERIOR REPAIR (RECTOCELE);  Surgeon: Salvadore Dom, MD;  Location: [facility];  Service: Gynecology;  Laterality: N/A;  . CARPAL TUNNEL RELEASE Bilateral   . CHOLECYSTECTOMY    . CYSTOSCOPY N/A 03/12/2017   Procedure: CYSTOSCOPY;  Surgeon: Salvadore Dom, MD;  Location: [facility];  Service: Gynecology;  Laterality: N/A;  . EYE SURGERY    . KNEE ARTHROSCOPY Left   . KNEE ARTHROSCOPY WITH MEDIAL MENISECTOMY Right 02/13/2019   Procedure: KNEE ARTHROSCOPY WITH MEDIAL MENISECTOMY;  Surgeon: Thornton Park, MD;  Location: [facility];  Service: Orthopedics;  Laterality: Right;  . LACRIMAL DUCT PROBING W/ DACRYOPLASTY Bilateral   . SALPINGOOPHORECTOMY Left 03/12/2017   Procedure: SALPINGECTOMY;  Surgeon: Salvadore Dom, MD;  Location: [facility];  Service: Gynecology;  Laterality: Left;  . SHOULDER ARTHROSCOPY WITH  OPEN ROTATOR CUFF REPAIR Right 09/03/2014   Procedure: SHOULDER ARTHROSCOPY WITH OPEN ROTATOR CUFF REPAIR;  Surgeon: Thornton Park, MD;  Location: [facility];  Service: Orthopedics;  Laterality: Right;  . THYROIDECTOMY Right    hemi-thyroidectomy  . TOTAL KNEE ARTHROPLASTY Right 05/20/2019   Procedure: TOTAL KNEE ARTHROPLASTY;  Surgeon: Thornton Park, MD;  Location: [facility];  Service: Orthopedics;  Laterality: Right;  Marland Kitchen VAGINAL HYSTERECTOMY N/A 03/12/2017   Procedure: HYSTERECTOMY VAGINAL;  Surgeon: Salvadore Dom, MD;  Location: [facility];  Service: Gynecology;  Laterality: N/A;    There were no vitals filed for this visit.  Subjective Assessment - 06/18/19 1237    Subjective  Pt. drove to PT today for first time.  Pt. only took Tylenol this morning.  Pt. states she is doing okay but hurting.    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  Pt. received PT prior to/ after knee scope this year.  See PT notes.    Limitations  Standing;Walking;House hold activities    Patient Stated Goals  Increase R knee ROM/ strength/ improve pain-free walking.    Currently in Pain?  Yes    Pain Score  5     Pain Location  Knee    Pain Orientation  Right    Pain Descriptors / Indicators  Aching  There.ex.:  Nustep L4 10 min. (seat position5)- no increase knee pain.  6" step ups/ lunges (static holds for flexion)- 10x each in //-bars. Amb. In //-barswith increase hip flexion/ step pattern (forward/ backwards). Lateral walking in //-bars 5x.  Recip. Stair climbing with UE assist and focus on descending/ R knee eccentric quad control.   Resisted gait 1BTB 5x forward/ lateral L and R.   Supine R knee to chest with yellow ball (quad control in midline).    Manual tx.:  Supine R knee flexion AA/PROM5x each with static holds.  Pain limited at 105 deg. Flex.  Patellar mobs. (all planes)-pain limited with superior/ inferior  direction. Scar massage/ distal quad STM  Pt. Will ice at home.    PT Long Term Goals - 06/01/19 2038      PT LONG TERM GOAL #1   Title  Pt. will increase FOTO to 49 to improve functional mobility.    Baseline  initial FOTO: 22    Time  4    Period  Weeks    Status  New    Target Date  06/25/19      PT LONG TERM GOAL #2   Title  Pt. will increase R knee AROM (0 to >115 deg.) to improve independence with gait.    Baseline  R knee AROM: -5 to 86 deg.    Time  4    Period  Weeks    Status  New    Target Date  06/25/19      PT LONG TERM GOAL #3   Title  Patient will demonstrate improved strength of BLE as evidenced by 1/2 increase in MMT for increased function at home and in the community.    Baseline  R hip flexion/ abduction strength 4/5 MMT, R quad 4-/5 MMT, R hamstring 4/5 MMT.    Time  4    Period  Weeks    Status  New    Target Date  06/25/19      PT LONG TERM GOAL #4   Title  Pt. able to ambulate with consistent 2-point gait pattern and use of SPC on level surfaces safely.    Baseline  pt. currently amb. with RW    Time  4    Period  Weeks    Status  New    Target Date  06/25/19            Plan - 06/18/19 1243    Clinical Impression Statement  Moderate R knee joint stiffness/ pain limited with flexion in seated and supine position.  Pt. remains hypersensitive with palpation/ touch over R knee and incision.  Good incision healing and no steristrips in place at this time.  Pt. has minimal scabbing noted but scar tissue at proximal and distal aspects of incision.  R knee flexion 105 deg. today with good knee extension.  Pt. progressing to more dynamic balance/ resisted therex. with good mechanics.    Personal Factors and Comorbidities  Age;Behavior Pattern;Profession;Past/Current Experience;Time since onset of injury/illness/exacerbation;Fitness;Comorbidity 3+    Comorbidities  HTN, HLD, OA, sleep apnea, vertigo    Examination-Activity Limitations   Squat;Lift;Stairs;Stand;Locomotion Level;Sit;Transfers;Sleep;Bend;Bed Mobility    Examination-Participation Restrictions  Shop;Laundry;Cleaning;Yard Work;Community Activity    Stability/Clinical Decision Making  Evolving/Moderate complexity    Clinical Decision Making  Moderate    Rehab Potential  Good    PT Frequency  2x / week    PT Duration  4 weeks    PT Treatment/Interventions  ADLs/Self Care  Home Management;Cryotherapy;Electrical Stimulation;Moist Heat;Stair training;Gait training;Therapeutic activities;Functional mobility training;Therapeutic exercise;Balance training;Orthotic Fit/Training;Patient/family education;Neuromuscular re-education;Manual techniques;Dry needling;Passive range of motion;Scar mobilization;Taping;Joint Manipulations    PT Next Visit Plan  progress R knee flexion.  Send MD progress note next tx. session.    PT Home Exercise Plan  [facility]    Consulted and Agree with Plan of Care  Patient       Patient will benefit from skilled therapeutic intervention in order to improve the following deficits and impairments:  Abnormal gait, Decreased balance, Decreased endurance, Decreased mobility, Difficulty walking, Pain, Postural dysfunction, Decreased strength, Decreased activity tolerance, Decreased range of motion, Improper body mechanics, Impaired flexibility, Hypomobility  Visit Diagnosis: Right knee pain, unspecified chronicity  Muscle weakness (generalized)  Difficulty in walking, not elsewhere classified  Joint stiffness of knee, right     Problem List Patient Active Problem List   Diagnosis Date Noted  . S/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, right 05/20/2019  . H/O total vaginal hysterectomy 03/12/2017  . Essential hypertension 03/08/2017  . Mixed dyslipidemia 03/08/2017  . Abnormal EKG 03/08/2017  . Preoperative cardiovascular examination 03/08/2017  . Allergic state 12/11/2013  . High cholesterol 12/11/2013  . History of depression  12/11/2013  . Hypothyroidism 12/11/2013  . Sleep apnea 12/11/2013   Pura Spice, PT, DPT # [phone] 06/18/2019, 12:48 PM  [facility] [address] [place], [place], [address] Phone: [phone]   Fax:  [phone]  Name: Jordan Palmer MRN: [id number] Date of Birth: 1950-09-16

## 2019-06-23 ENCOUNTER — Encounter: Payer: Self-pay | Admitting: Physical Therapy

## 2019-06-23 ENCOUNTER — Other Ambulatory Visit: Payer: Self-pay

## 2019-06-23 ENCOUNTER — Ambulatory Visit: Payer: Medicare Other | Admitting: Physical Therapy

## 2019-06-23 DIAGNOSIS — R262 Difficulty in walking, not elsewhere classified: Secondary | ICD-10-CM

## 2019-06-23 DIAGNOSIS — M25561 Pain in right knee: Secondary | ICD-10-CM

## 2019-06-23 DIAGNOSIS — M6281 Muscle weakness (generalized): Secondary | ICD-10-CM

## 2019-06-23 DIAGNOSIS — M25661 Stiffness of right knee, not elsewhere classified: Secondary | ICD-10-CM

## 2019-06-23 NOTE — Therapy (Signed)
[facility] [address] [place], [place], [address] Phone: [phone]   Fax:  [phone]  Physical Therapy Treatment  Patient Details  Name: Jordan Palmer MRN: [id number] Date of Birth: [birth year] Referring Provider (PT): Dr. Mack Guise   Encounter Date: 06/23/2019  PT End of Session - 06/23/19 1249    Visit Number  8    Number of Visits  9    Date for PT Re-Evaluation  06/25/19    Authorization - Visit Number  8    Authorization - Number of Visits  10    PT Start Time  0811    PT Stop Time  0902    PT Time Calculation (min)  51 min    Activity Tolerance  Patient tolerated treatment well;Patient limited by pain    Behavior During Therapy  [facility] for tasks assessed/performed       Past Medical History:  Diagnosis Date  . Arthritis   . Hyperlipemia   . Hypertension   . Hypothyroidism   . Sleep apnea   . Vertigo     Past Surgical History:  Procedure Laterality Date  . ANTERIOR AND POSTERIOR REPAIR N/A 03/12/2017   Procedure: ANTERIOR (CYSTOCELE) AND POSTERIOR REPAIR (RECTOCELE);  Surgeon: Salvadore Dom, MD;  Location: [facility];  Service: Gynecology;  Laterality: N/A;  . CARPAL TUNNEL RELEASE Bilateral   . CHOLECYSTECTOMY    . CYSTOSCOPY N/A 03/12/2017   Procedure: CYSTOSCOPY;  Surgeon: Salvadore Dom, MD;  Location: [facility];  Service: Gynecology;  Laterality: N/A;  . EYE SURGERY    . KNEE ARTHROSCOPY Left   . KNEE ARTHROSCOPY WITH MEDIAL MENISECTOMY Right 02/13/2019   Procedure: KNEE ARTHROSCOPY WITH MEDIAL MENISECTOMY;  Surgeon: Thornton Park, MD;  Location: [facility];  Service: Orthopedics;  Laterality: Right;  . LACRIMAL DUCT PROBING W/ DACRYOPLASTY Bilateral   . SALPINGOOPHORECTOMY Left 03/12/2017   Procedure: SALPINGECTOMY;  Surgeon: Salvadore Dom, MD;  Location: [facility];  Service: Gynecology;  Laterality: Left;  . SHOULDER ARTHROSCOPY WITH  OPEN ROTATOR CUFF REPAIR Right 09/03/2014   Procedure: SHOULDER ARTHROSCOPY WITH OPEN ROTATOR CUFF REPAIR;  Surgeon: Thornton Park, MD;  Location: [facility];  Service: Orthopedics;  Laterality: Right;  . THYROIDECTOMY Right    hemi-thyroidectomy  . TOTAL KNEE ARTHROPLASTY Right 05/20/2019   Procedure: TOTAL KNEE ARTHROPLASTY;  Surgeon: Thornton Park, MD;  Location: [facility];  Service: Orthopedics;  Laterality: Right;  Marland Kitchen VAGINAL HYSTERECTOMY N/A 03/12/2017   Procedure: HYSTERECTOMY VAGINAL;  Surgeon: Salvadore Dom, MD;  Location: [facility];  Service: Gynecology;  Laterality: N/A;    There were no vitals filed for this visit.  Subjective Assessment - 06/23/19 1247    Subjective  Pt. entered PT with no SPC today (forgot SPC in car).  Pt. states she was busy this weekend.  Pt. states she has been using scar cream on incision.    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  Pt. received PT prior to/ after knee scope this year.  See PT notes.    Limitations  Standing;Walking;House hold activities    Patient Stated Goals  Increase R knee ROM/ strength/ improve pain-free walking.    Currently in Pain?  Yes    Pain Score  4     Pain Location  Knee    Pain Orientation  Right    Pain Descriptors / Indicators  Aching    Pain Type  Surgical pain         There.ex.:  Nustep L4 10 min. (seat position5)- no increase knee pain.  Amb. In //-barswith increase hip flexion/ step pattern (forward/ backwards). Lateral walking in //-bars 5x.  No UE assist/ mirror feedback TG knee flexion 30x/ heel raises/ gastroc stretches 20x. Reviewed HEP  Manual tx.:  Supine R knee flexion AAROM5x each with static holds.  Pain limited at 105 deg. Flex.  Patellar mobs. (all planes)-pain limited with superior/ inferior direction. Scar massage/ proximal to distal incision.   Pt. Will ice at home.    PT Long Term Goals - 06/01/19 2038      PT LONG  TERM GOAL #1   Title  Pt. will increase FOTO to 49 to improve functional mobility.    Baseline  initial FOTO: 22    Time  4    Period  Weeks    Status  New    Target Date  06/25/19      PT LONG TERM GOAL #2   Title  Pt. will increase R knee AROM (0 to >115 deg.) to improve independence with gait.    Baseline  R knee AROM: -5 to 86 deg.    Time  4    Period  Weeks    Status  New    Target Date  06/25/19      PT LONG TERM GOAL #3   Title  Patient will demonstrate improved strength of BLE as evidenced by 1/2 increase in MMT for increased function at home and in the community.    Baseline  R hip flexion/ abduction strength 4/5 MMT, R quad 4-/5 MMT, R hamstring 4/5 MMT.    Time  4    Period  Weeks    Status  New    Target Date  06/25/19      PT LONG TERM GOAL #4   Title  Pt. able to ambulate with consistent 2-point gait pattern and use of SPC on level surfaces safely.    Baseline  pt. currently amb. with RW    Time  4    Period  Weeks    Status  New    Target Date  06/25/19            Plan - 06/23/19 1249    Clinical Impression Statement  Pt. presents with good incision healing and PT demonstrated scar massage, esp. at proximal aspect of incision.  R knee flexion remains pain limited at >105 deg. in supine position.  Marked improvement in gait pattern with slight limitation in R knee flexion/ step pattern noted.  Pt. able to correct with verbal cuing/ instruction.    Personal Factors and Comorbidities  Age;Behavior Pattern;Profession;Past/Current Experience;Time since onset of injury/illness/exacerbation;Fitness;Comorbidity 3+    Comorbidities  HTN, HLD, OA, sleep apnea, vertigo    Examination-Activity Limitations  Squat;Lift;Stairs;Stand;Locomotion Level;Sit;Transfers;Sleep;Bend;Bed Mobility    Examination-Participation Restrictions  Shop;Laundry;Cleaning;Yard Work;Community Activity    Stability/Clinical Decision Making  Evolving/Moderate complexity    Clinical Decision  Making  Moderate    Rehab Potential  Good    PT Frequency  2x / week    PT Duration  4 weeks    PT Treatment/Interventions  ADLs/Self Care Home Management;Cryotherapy;Electrical Stimulation;Moist Heat;Stair training;Gait training;Therapeutic activities;Functional mobility training;Therapeutic exercise;Balance training;Orthotic Fit/Training;Patient/family education;Neuromuscular re-education;Manual techniques;Dry needling;Passive range of motion;Scar mobilization;Taping;Joint Manipulations    PT Next Visit Plan  progress R knee flexion.  Send MD progress note next tx. session.  PT Home Exercise Plan  [facility]    Consulted and Agree with Plan of Care  Patient       Patient will benefit from skilled therapeutic intervention in order to improve the following deficits and impairments:  Abnormal gait, Decreased balance, Decreased endurance, Decreased mobility, Difficulty walking, Pain, Postural dysfunction, Decreased strength, Decreased activity tolerance, Decreased range of motion, Improper body mechanics, Impaired flexibility, Hypomobility  Visit Diagnosis: Right knee pain, unspecified chronicity  Muscle weakness (generalized)  Difficulty in walking, not elsewhere classified  Joint stiffness of knee, right     Problem List Patient Active Problem List   Diagnosis Date Noted  . S/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, right 05/20/2019  . H/O total vaginal hysterectomy 03/12/2017  . Essential hypertension 03/08/2017  . Mixed dyslipidemia 03/08/2017  . Abnormal EKG 03/08/2017  . Preoperative cardiovascular examination 03/08/2017  . Allergic state 12/11/2013  . High cholesterol 12/11/2013  . History of depression 12/11/2013  . Hypothyroidism 12/11/2013  . Sleep apnea 12/11/2013   Pura Spice, PT, DPT # [phone] 06/23/2019, 6:05 PM  [facility] [address] [place], [place], [address] Phone: [phone]   Fax:   [phone]  Name: HALIEGH WHISENANT MRN: [id number] Date of Birth: 06-13-1950

## 2019-06-25 ENCOUNTER — Ambulatory Visit: Payer: Medicare Other | Admitting: Physical Therapy

## 2019-06-25 ENCOUNTER — Encounter: Payer: Self-pay | Admitting: Physical Therapy

## 2019-06-25 ENCOUNTER — Other Ambulatory Visit: Payer: Self-pay

## 2019-06-25 DIAGNOSIS — R262 Difficulty in walking, not elsewhere classified: Secondary | ICD-10-CM

## 2019-06-25 DIAGNOSIS — M25561 Pain in right knee: Secondary | ICD-10-CM | POA: Diagnosis not present

## 2019-06-25 DIAGNOSIS — M25661 Stiffness of right knee, not elsewhere classified: Secondary | ICD-10-CM

## 2019-06-25 DIAGNOSIS — M6281 Muscle weakness (generalized): Secondary | ICD-10-CM

## 2019-06-25 NOTE — Therapy (Signed)
[facility] [address]. [place], [place], [address] Phone: [phone]   Fax:  [phone]  Physical Therapy Treatment  Patient Details  Name: Jordan Palmer MRN: [id number] Date of Birth: Dec 29, 1950 Referring Provider (PT): Dr. Mack Guise   Encounter Date: 06/25/2019  PT End of Session - 06/25/19 0810    Visit Number  9    Number of Visits  17    Date for PT Re-Evaluation  07/23/19    Authorization - Visit Number  1    Authorization - Number of Visits  10    PT Start Time  0803    PT Stop Time  8377    PT Time Calculation (min)  54 min    Activity Tolerance  Patient tolerated treatment well;Patient limited by pain    Behavior During Therapy  [facility] for tasks assessed/performed       Past Medical History:  Diagnosis Date  . Arthritis   . Hyperlipemia   . Hypertension   . Hypothyroidism   . Sleep apnea   . Vertigo     Past Surgical History:  Procedure Laterality Date  . ANTERIOR AND POSTERIOR REPAIR N/A 03/12/2017   Procedure: ANTERIOR (CYSTOCELE) AND POSTERIOR REPAIR (RECTOCELE);  Surgeon: Salvadore Dom, MD;  Location: [facility];  Service: Gynecology;  Laterality: N/A;  . CARPAL TUNNEL RELEASE Bilateral   . CHOLECYSTECTOMY    . CYSTOSCOPY N/A 03/12/2017   Procedure: CYSTOSCOPY;  Surgeon: Salvadore Dom, MD;  Location: [facility];  Service: Gynecology;  Laterality: N/A;  . EYE SURGERY    . KNEE ARTHROSCOPY Left   . KNEE ARTHROSCOPY WITH MEDIAL MENISECTOMY Right 02/13/2019   Procedure: KNEE ARTHROSCOPY WITH MEDIAL MENISECTOMY;  Surgeon: Thornton Park, MD;  Location: [facility];  Service: Orthopedics;  Laterality: Right;  . LACRIMAL DUCT PROBING W/ DACRYOPLASTY Bilateral   . SALPINGOOPHORECTOMY Left 03/12/2017   Procedure: SALPINGECTOMY;  Surgeon: Salvadore Dom, MD;  Location: [facility];  Service: Gynecology;  Laterality: Left;  . SHOULDER ARTHROSCOPY WITH  OPEN ROTATOR CUFF REPAIR Right 09/03/2014   Procedure: SHOULDER ARTHROSCOPY WITH OPEN ROTATOR CUFF REPAIR;  Surgeon: Thornton Park, MD;  Location: [facility];  Service: Orthopedics;  Laterality: Right;  . THYROIDECTOMY Right    hemi-thyroidectomy  . TOTAL KNEE ARTHROPLASTY Right 05/20/2019   Procedure: TOTAL KNEE ARTHROPLASTY;  Surgeon: Thornton Park, MD;  Location: [facility];  Service: Orthopedics;  Laterality: Right;  Marland Kitchen VAGINAL HYSTERECTOMY N/A 03/12/2017   Procedure: HYSTERECTOMY VAGINAL;  Surgeon: Salvadore Dom, MD;  Location: [facility];  Service: Gynecology;  Laterality: N/A;    There were no vitals filed for this visit.  Subjective Assessment - 06/25/19 0806    Subjective  Pt. entered PT with reports of 5/10 R knee pain and increase pain when trying to sleep at night.  No SPC use today.  Pt. states pool at home is too cold at this time (82 deg.).    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  Pt. received PT prior to/ after knee scope this year.  See PT notes.    Limitations  Standing;Walking;House hold activities    Patient Stated Goals  Increase R knee ROM/ strength/ improve pain-free walking.    Currently in Pain?  Yes    Pain Score  4     Pain Location  Knee    Pain Orientation  Right  Pain Descriptors / Indicators  Aching         [facility] PT Assessment - 06/25/19 0001      Assessment   Medical Diagnosis  Osteoarthritis R knee joint/ R TKA    Referring Provider (PT)  Dr. Mack Guise    Onset Date/Surgical Date  05/20/19      Prior Function   Level of Independence  Independent        There.ex.:  TG knee flexion 30x/ heel raises/ gastroc stretches 20x. Nustep L4 10 min. (seat position4)- increase R knee pain.  Amb. In //-barswith increase hip flexion/ step pattern (forward/ backwards). Lateral walking in //-bars 5x.  No UE assist/ mirror feedback Step ups/ downs.   Manual tx.:  Supine R knee flexion  AAROM5x each with static holds.Pain limited at 106 deg. Flex. Patellar mobs. (all planes)-pain limited with superior/ inferior direction. Scar massage/ proximal to distal incision.   Pt. Will ice at home.    PT Long Term Goals - 06/25/19 1355      PT LONG TERM GOAL #1   Title  Pt. will increase FOTO to 49 to improve functional mobility.    Baseline  initial FOTO: 22.  5/26: 52 (goal met)    Time  4    Period  Weeks    Status  Achieved    Target Date  06/25/19      PT LONG TERM GOAL #2   Title  Pt. will increase R knee AROM (0 to >115 deg.) to improve independence with gait.    Baseline  R knee AROM: -5 to 86 deg.  5/26: -2 to 106 deg.    Time  4    Period  Weeks    Status  Partially Met    Target Date  07/23/19      PT LONG TERM GOAL #3   Title  Patient will demonstrate improved strength of BLE as evidenced by 1/2 increase in MMT for increased function at home and in the community.    Baseline  R hip flexion/ abduction strength 4/5 MMT, R quad 4-/5 MMT, R hamstring 4/5 MMT.    Time  4    Period  Weeks    Status  Partially Met    Target Date  07/23/19      PT LONG TERM GOAL #4   Title  Pt. able to ambulate with consistent 2-point gait pattern and use of SPC on level surfaces safely.    Baseline  pt. currently amb. with RW.  5/26: more normalized gait without SPC    Time  4    Period  Weeks    Status  Achieved    Target Date  06/25/19      PT LONG TERM GOAL #5   Title  Pt. able to ambulate with consistent recip. pattern outside on grassy terrain with no issues to improve mobility at home.    Baseline  Pt. hesitant on uneven or grassy terrain.    Time  4    Period  Weeks    Status  New    Target Date  07/23/19      Additional Long Term Goals   Additional Long Term Goals  Yes      PT LONG TERM GOAL #6   Title  Pt. able to ascend/ descend 10 stairs with recip. pattern and no UE assist to improve mobility.    Baseline  handrails required.    Time  4  Period  Weeks    Status  New    Target Date  07/23/19         Plan - 06/25/19 1347    Clinical Impression Statement  Pt. remains pain limited/focused with sleeping in bed and prolonged walking in R knee.  Pt. reports 4/10 R knee pain consistently and states nothing really helps pain.  Pt. compliant with use of ice and completion of HEP.  R knee flexion (106 deg.)- significant increase in pain/ guarded.  Pt. ambulating with marked improvement and more normalized gait pattern.  PT recommends pt. focus on knee flexion with increase static holds, not extension at this time.  Pt. will continue to benefit from skilled PT services to increase R knee flexion/ stability to improve pain-free mobility.    Personal Factors and Comorbidities  Age;Behavior Pattern;Profession;Past/Current Experience;Time since onset of injury/illness/exacerbation;Fitness;Comorbidity 3+    Comorbidities  HTN, HLD, OA, sleep apnea, vertigo    Examination-Activity Limitations  Squat;Lift;Stairs;Stand;Locomotion Level;Sit;Transfers;Sleep;Bend;Bed Mobility    Examination-Participation Restrictions  Shop;Laundry;Cleaning;Yard Work;Community Activity    Stability/Clinical Decision Making  Evolving/Moderate complexity    Clinical Decision Making  Moderate    Rehab Potential  Good    PT Frequency  2x / week    PT Duration  4 weeks    PT Treatment/Interventions  ADLs/Self Care Home Management;Cryotherapy;Electrical Stimulation;Moist Heat;Stair training;Gait training;Therapeutic activities;Functional mobility training;Therapeutic exercise;Balance training;Orthotic Fit/Training;Patient/family education;Neuromuscular re-education;Manual techniques;Dry needling;Passive range of motion;Scar mobilization;Taping;Joint Manipulations    PT Next Visit Plan  Discuss MD f/u.    PT Home Exercise Plan  [facility]    Consulted and Agree with Plan of Care  Patient       Patient will benefit from skilled therapeutic intervention in order to improve  the following deficits and impairments:  Abnormal gait, Decreased balance, Decreased endurance, Decreased mobility, Difficulty walking, Pain, Postural dysfunction, Decreased strength, Decreased activity tolerance, Decreased range of motion, Improper body mechanics, Impaired flexibility, Hypomobility  Visit Diagnosis: Right knee pain, unspecified chronicity  Muscle weakness (generalized)  Difficulty in walking, not elsewhere classified  Joint stiffness of knee, right     Problem List Patient Active Problem List   Diagnosis Date Noted  . S/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, right 05/20/2019  . H/O total vaginal hysterectomy 03/12/2017  . Essential hypertension 03/08/2017  . Mixed dyslipidemia 03/08/2017  . Abnormal EKG 03/08/2017  . Preoperative cardiovascular examination 03/08/2017  . Allergic state 12/11/2013  . High cholesterol 12/11/2013  . History of depression 12/11/2013  . Hypothyroidism 12/11/2013  . Sleep apnea 12/11/2013   Pura Spice, PT, DPT # [phone] 06/25/2019, 2:03 PM  [facility] [address] [place], [place], [address] Phone: [phone]   Fax:  [phone]  Name: Jordan Palmer MRN: [id number] Date of Birth: Aug 28, 1950

## 2019-07-01 ENCOUNTER — Other Ambulatory Visit: Payer: Self-pay

## 2019-07-01 ENCOUNTER — Ambulatory Visit: Payer: Medicare Other | Attending: Orthopedic Surgery

## 2019-07-01 ENCOUNTER — Encounter: Payer: Self-pay | Admitting: Physical Therapy

## 2019-07-01 DIAGNOSIS — M25661 Stiffness of right knee, not elsewhere classified: Secondary | ICD-10-CM | POA: Diagnosis present

## 2019-07-01 DIAGNOSIS — M6281 Muscle weakness (generalized): Secondary | ICD-10-CM | POA: Diagnosis present

## 2019-07-01 DIAGNOSIS — R262 Difficulty in walking, not elsewhere classified: Secondary | ICD-10-CM | POA: Insufficient documentation

## 2019-07-01 DIAGNOSIS — M25561 Pain in right knee: Secondary | ICD-10-CM | POA: Insufficient documentation

## 2019-07-01 DIAGNOSIS — Z96651 Presence of right artificial knee joint: Secondary | ICD-10-CM | POA: Insufficient documentation

## 2019-07-01 NOTE — Therapy (Addendum)
[place] [facility] [address] [place], [place], [address] Phone: [phone]   Fax:  [phone]  Physical Therapy Treatment and Progress Note  Dates of reporting period  05/30/19   to   07/01/2019   Patient Details  Name: Jordan Palmer MRN: [id number] Date of Birth: [birth year] Referring Provider (PT): Dr. Mack Guise   Encounter Date: 07/01/2019  PT End of Session - 07/01/19 0815    Visit Number  10    Number of Visits  17    Date for PT Re-Evaluation  07/23/19    PT Start Time  0815    PT Stop Time  0858    PT Time Calculation (min)  43 min    Activity Tolerance  Patient tolerated treatment well;Patient limited by pain    Behavior During Therapy  [facility] for tasks assessed/performed       Past Medical History:  Diagnosis Date  . Arthritis   . Hyperlipemia   . Hypertension   . Hypothyroidism   . Sleep apnea   . Vertigo     Past Surgical History:  Procedure Laterality Date  . ANTERIOR AND POSTERIOR REPAIR N/A 03/12/2017   Procedure: ANTERIOR (CYSTOCELE) AND POSTERIOR REPAIR (RECTOCELE);  Surgeon: Salvadore Dom, MD;  Location: [facility];  Service: Gynecology;  Laterality: N/A;  . CARPAL TUNNEL RELEASE Bilateral   . CHOLECYSTECTOMY    . CYSTOSCOPY N/A 03/12/2017   Procedure: CYSTOSCOPY;  Surgeon: Salvadore Dom, MD;  Location: [facility];  Service: Gynecology;  Laterality: N/A;  . EYE SURGERY    . KNEE ARTHROSCOPY Left   . KNEE ARTHROSCOPY WITH MEDIAL MENISECTOMY Right 02/13/2019   Procedure: KNEE ARTHROSCOPY WITH MEDIAL MENISECTOMY;  Surgeon: Thornton Park, MD;  Location: [facility];  Service: Orthopedics;  Laterality: Right;  . LACRIMAL DUCT PROBING W/ DACRYOPLASTY Bilateral   . SALPINGOOPHORECTOMY Left 03/12/2017   Procedure: SALPINGECTOMY;  Surgeon: Salvadore Dom, MD;  Location[facility];  Service: Gynecology;  Laterality: Left;  . SHOULDER ARTHROSCOPY WITH  OPEN ROTATOR CUFF REPAIR Right 09/03/2014   Procedure: SHOULDER ARTHROSCOPY WITH OPEN ROTATOR CUFF REPAIR;  Surgeon: Thornton Park, MD;  Location: [facility];  Service: Orthopedics;  Laterality: Right;  . THYROIDECTOMY Right    hemi-thyroidectomy  . TOTAL KNEE ARTHROPLASTY Right 05/20/2019   Procedure: TOTAL KNEE ARTHROPLASTY;  Surgeon: Thornton Park, MD;  Location: [facility];  Service: Orthopedics;  Laterality: Right;  Marland Kitchen VAGINAL HYSTERECTOMY N/A 03/12/2017   Procedure: HYSTERECTOMY VAGINAL;  Surgeon: Salvadore Dom, MD;  Location: [facility];  Service: Gynecology;  Laterality: N/A;    There were no vitals filed for this visit.  Subjective Assessment - 07/01/19 0817    Subjective  Patient reported the doctor said her scar and ROM is looking good, sees doctor again on July 7th.    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  Pt. received PT prior to/ after knee scope this year.  See PT notes.    Limitations  Standing;Walking;House hold activities    Patient Stated Goals  Increase R knee ROM/ strength/ improve pain-free walking.    Currently in Pain?  Yes    Pain Score  5     Pain Location  Knee    Pain Orientation  Right    Pain Descriptors / Indicators  Aching    Pain Type  Surgical pain  There.ex.:    Nustep L4 8 min. (seat position 4)- increase R knee pain.      Step downs from normal step x10 Step down to blue foam x10 Step downs to blue foam with unilateral UE support Heel slides x12 AAROM with towel on floor Standing on foam 1 leg 3x20sec Standing on foam tandem 2x30sec Eyes close feet together on foam 3x30sec   Manual tx.:   Supine R knee flexion AAROM 5x each with static holds.  Pain limited at 106 deg. Flex.  Patellar mobs. (all planes, focus on inferior   Pt. Will ice at home.  1-2 degree from extension 98deg - 106 deg  pt response clinical impression: Pt challenged by knee flexion today, able to increase  motion to 106degrees. Pt also challenged by proprioception based activities, and uneven surfaces. Goals carried forward for progress note se goals section. Pt reported and demonstrated progression in functional activities, strength, ROM with physical therapy.  The patient would benefit from further skilled PT intervention to continue to progress towards goals.    PT Education - 07/01/19 0813    Education Details  threx form/technique, POC    Person(s) Educated  Patient    Methods  Explanation;Demonstration;Handout    Comprehension  Verbalized understanding;Returned demonstration          PT Long Term Goals - 07/01/19 0814      PT LONG TERM GOAL #1   Title  Pt. will increase FOTO to 49 to improve functional mobility.    Baseline  initial FOTO: 22.  5/26: 52 (goal met)    Time  4    Period  Weeks    Status  Achieved      PT LONG TERM GOAL #2   Title  Pt. will increase R knee AROM (0 to >115 deg.) to improve independence with gait.    Baseline  R knee AROM: -5 to 86 deg.  5/26: -2 to 106 deg.; 6/1    Time  4    Period  Weeks    Status  Partially Met      PT LONG TERM GOAL #3   Title  Patient will demonstrate improved strength of BLE as evidenced by 1/2 increase in MMT for increased function at home and in the community.    Baseline  R hip flexion/ abduction strength 4/5 MMT, R quad 4-/5 MMT, R hamstring 4/5 MMT. ; 6/1    Time  4    Period  Weeks    Status  Partially Met      PT LONG TERM GOAL #4   Title  Pt. able to ambulate with consistent 2-point gait pattern and use of SPC on level surfaces safely.    Baseline  pt. currently amb. with RW.  5/26: more normalized gait without SPC    Time  4    Period  Weeks    Status  Achieved      PT LONG TERM GOAL #5   Title  Pt. able to ambulate with consistent recip. pattern outside on grassy terrain with no issues to improve mobility at home.    Baseline  Pt. hesitant on uneven or grassy terrain.    Time  4    Period  Weeks     Status  New      PT LONG TERM GOAL #6   Title  Pt. able to ascend/ descend 10 stairs with recip. pattern and no UE assist to improve mobility.    Baseline  handrails required.    Time  4    Period  Weeks    Status  New            Plan - 07/01/19 0925    Personal Factors and Comorbidities  Age;Behavior Pattern;Profession;Past/Current Experience;Time since onset of injury/illness/exacerbation;Fitness;Comorbidity 3+    Comorbidities  HTN, HLD, OA, sleep apnea, vertigo    Examination-Activity Limitations  Squat;Lift;Stairs;Stand;Locomotion Level;Sit;Transfers;Sleep;Bend;Bed Mobility    Stability/Clinical Decision Making  Evolving/Moderate complexity    Rehab Potential  Good    PT Frequency  2x / week    PT Duration  4 weeks    PT Treatment/Interventions  ADLs/Self Care Home Management;Cryotherapy;Electrical Stimulation;Moist Heat;Stair training;Gait training;Therapeutic activities;Functional mobility training;Therapeutic exercise;Balance training;Orthotic Fit/Training;Patient/family education;Neuromuscular re-education;Manual techniques;Dry needling;Passive range of motion;Scar mobilization;Taping;Joint Manipulations    PT Next Visit Plan  Discuss MD f/u.    PT Home Exercise Plan  [facility]    Consulted and Agree with Plan of Care  Patient       Patient will benefit from skilled therapeutic intervention in order to improve the following deficits and impairments:  Abnormal gait, Decreased balance, Decreased endurance, Decreased mobility, Difficulty walking, Pain, Postural dysfunction, Decreased strength, Decreased activity tolerance, Decreased range of motion, Improper body mechanics, Impaired flexibility, Hypomobility  Visit Diagnosis: Right knee pain, unspecified chronicity  Muscle weakness (generalized)  Difficulty in walking, not elsewhere classified  Joint stiffness of knee, right     Problem List Patient Active Problem List   Diagnosis Date Noted  . S/P TKR (total knee  replacement) using cement, right 05/20/2019  . H/O total vaginal hysterectomy 03/12/2017  . Essential hypertension 03/08/2017  . Mixed dyslipidemia 03/08/2017  . Abnormal EKG 03/08/2017  . Preoperative cardiovascular examination 03/08/2017  . Allergic state 12/11/2013  . High cholesterol 12/11/2013  . History of depression 12/11/2013  . Hypothyroidism 12/11/2013  . Sleep apnea 12/11/2013    Lieutenant Diego PT, DPT 9:30 AM,07/01/19   [facility] [address] [place], [place], [address] Phone: [phone]   Fax:  [phone]  Name: JEREE DELCID MRN: [id number] Date of Birth: 14-Jul-1950

## 2019-07-07 ENCOUNTER — Encounter: Payer: Self-pay | Admitting: Physical Therapy

## 2019-07-07 ENCOUNTER — Ambulatory Visit: Payer: Medicare Other | Admitting: Physical Therapy

## 2019-07-07 ENCOUNTER — Other Ambulatory Visit: Payer: Self-pay

## 2019-07-07 DIAGNOSIS — R262 Difficulty in walking, not elsewhere classified: Secondary | ICD-10-CM

## 2019-07-07 DIAGNOSIS — M25561 Pain in right knee: Secondary | ICD-10-CM

## 2019-07-07 DIAGNOSIS — M25661 Stiffness of right knee, not elsewhere classified: Secondary | ICD-10-CM

## 2019-07-07 DIAGNOSIS — M6281 Muscle weakness (generalized): Secondary | ICD-10-CM

## 2019-07-07 NOTE — Therapy (Signed)
[place] [facility] [address] [place], [place], [address] Phone: [phone]   Fax:  [phone]  Physical Therapy Treatment  Patient Details  Name: Jordan Palmer MRN: [id number] Date of Birth: [birth year] Referring Provider (PT): Dr. Mack Guise   Encounter Date: 07/07/2019  PT End of Session - 07/07/19 1241    Visit Number  11    Number of Visits  17    Date for PT Re-Evaluation  07/23/19    Authorization - Visit Number  3    Authorization - Number of Visits  10    PT Start Time  5400    PT Stop Time  0945    PT Time Calculation (min)  47 min    Activity Tolerance  Patient tolerated treatment well;Patient limited by pain    Behavior During Therapy  [facility] for tasks assessed/performed       Past Medical History:  Diagnosis Date  . Arthritis   . Hyperlipemia   . Hypertension   . Hypothyroidism   . Sleep apnea   . Vertigo     Past Surgical History:  Procedure Laterality Date  . ANTERIOR AND POSTERIOR REPAIR N/A 03/12/2017   Procedure: ANTERIOR (CYSTOCELE) AND POSTERIOR REPAIR (RECTOCELE);  Surgeon: Salvadore Dom, MD;  Location: [facility];  Service: Gynecology;  Laterality: N/A;  . CARPAL TUNNEL RELEASE Bilateral   . CHOLECYSTECTOMY    . CYSTOSCOPY N/A 03/12/2017   Procedure: CYSTOSCOPY;  Surgeon: Salvadore Dom, MD;  Location: [facility];  Service: Gynecology;  Laterality: N/A;  . EYE SURGERY    . KNEE ARTHROSCOPY Left   . KNEE ARTHROSCOPY WITH MEDIAL MENISECTOMY Right 02/13/2019   Procedure: KNEE ARTHROSCOPY WITH MEDIAL MENISECTOMY;  Surgeon: Thornton Park, MD;  Location: [facility];  Service: Orthopedics;  Laterality: Right;  . LACRIMAL DUCT PROBING W/ DACRYOPLASTY Bilateral   . SALPINGOOPHORECTOMY Left 03/12/2017   Procedure: SALPINGECTOMY;  Surgeon: Salvadore Dom, MD;  Location: [facility];  Service: Gynecology;  Laterality: Left;  . SHOULDER ARTHROSCOPY WITH  OPEN ROTATOR CUFF REPAIR Right 09/03/2014   Procedure: SHOULDER ARTHROSCOPY WITH OPEN ROTATOR CUFF REPAIR;  Surgeon: Thornton Park, MD;  Location: [facility];  Service: Orthopedics;  Laterality: Right;  . THYROIDECTOMY Right    hemi-thyroidectomy  . TOTAL KNEE ARTHROPLASTY Right 05/20/2019   Procedure: TOTAL KNEE ARTHROPLASTY;  Surgeon: Thornton Park, MD;  Location: [facility];  Service: Orthopedics;  Laterality: Right;  Marland Kitchen VAGINAL HYSTERECTOMY N/A 03/12/2017   Procedure: HYSTERECTOMY VAGINAL;  Surgeon: Salvadore Dom, MD;  Location: [facility];  Service: Gynecology;  Laterality: N/A;    There were no vitals filed for this visit.  Subjective Assessment - 07/07/19 1239    Subjective  Pt. c/o 3-4/10 R knee pain while walking into PT clinic.  Pt. states she feels stiff in R knee/ quad muscle.    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  Pt. received PT prior to/ after knee scope this year.  See PT notes.    Limitations  Standing;Walking;House hold activities    Patient Stated Goals  Increase R knee ROM/ strength/ improve pain-free walking.    Currently in Pain?  Yes    Pain Score  4     Pain Location  Knee    Pain Orientation  Right    Pain Descriptors / Indicators  Aching;Sore  There.ex.:  Nustep L5 10 min. (seat position5)- no increase pain.  Amb. In //-barswith increase hip flexion/ step pattern (forward/ backwards). Lateral walking in //-bars 5x.No UE assist/ mirror feedback Recip. Stairs with light UE assist on //-bars.  Discomfort with R knee flexion during descending stairs.    Manual tx.:  Supine R knee flexion AAROM5x each with static holds.Pain limited at >105 deg. Flex. Patellar mobs. (all planes)-pain limited with superior/ inferior direction. Small stitch breaking through skin at distal patella (no redness) Seated contract-relax on mat table 5x with focus on increase flexion.         PT  Long Term Goals - 07/01/19 0814      PT LONG TERM GOAL #1   Title  Pt. will increase FOTO to 49 to improve functional mobility.    Baseline  initial FOTO: 22.  5/26: 52 (goal met)    Time  4    Period  Weeks    Status  Achieved      PT LONG TERM GOAL #2   Title  Pt. will increase R knee AROM (0 to >115 deg.) to improve independence with gait.    Baseline  R knee AROM: -5 to 86 deg.  5/26: -2 to 106 deg.; 6/1    Time  4    Period  Weeks    Status  Partially Met      PT LONG TERM GOAL #3   Title  Patient will demonstrate improved strength of BLE as evidenced by 1/2 increase in MMT for increased function at home and in the community.    Baseline  R hip flexion/ abduction strength 4/5 MMT, R quad 4-/5 MMT, R hamstring 4/5 MMT. ; 6/1    Time  4    Period  Weeks    Status  Partially Met      PT LONG TERM GOAL #4   Title  Pt. able to ambulate with consistent 2-point gait pattern and use of SPC on level surfaces safely.    Baseline  pt. currently amb. with RW.  5/26: more normalized gait without SPC    Time  4    Period  Weeks    Status  Achieved      PT LONG TERM GOAL #5   Title  Pt. able to ambulate with consistent recip. pattern outside on grassy terrain with no issues to improve mobility at home.    Baseline  Pt. hesitant on uneven or grassy terrain.    Time  4    Period  Weeks    Status  New      PT LONG TERM GOAL #6   Title  Pt. able to ascend/ descend 10 stairs with recip. pattern and no UE assist to improve mobility.    Baseline  handrails required.    Time  4    Period  Weeks    Status  New            Plan - 07/07/19 1241    Clinical Impression Statement  R knee flexion remains pain limited at >105 deg. flexion.  Pt. guarded/pain limited with supine knee flexion passive stretches.  Pt. pain limited with seated R knee contract-relax technique to increase flexion.  Discomfort with patellar mobs (all planes).  PT instructed pt. to decrease use of polarcare/icing at  night time and focus on icing after exercise/ increase activity.    Personal Factors and Comorbidities  Age;Behavior Pattern;Profession;Past/Current Experience;Time since onset of injury/illness/exacerbation;Fitness;Comorbidity 3+  Comorbidities  HTN, HLD, OA, sleep apnea, vertigo    Examination-Activity Limitations  Squat;Lift;Stairs;Stand;Locomotion Level;Sit;Transfers;Sleep;Bend;Bed Mobility    Stability/Clinical Decision Making  Evolving/Moderate complexity    Clinical Decision Making  Moderate    Rehab Potential  Good    PT Frequency  2x / week    PT Duration  4 weeks    PT Treatment/Interventions  ADLs/Self Care Home Management;Cryotherapy;Electrical Stimulation;Moist Heat;Stair training;Gait training;Therapeutic activities;Functional mobility training;Therapeutic exercise;Balance training;Orthotic Fit/Training;Patient/family education;Neuromuscular re-education;Manual techniques;Dry needling;Passive range of motion;Scar mobilization;Taping;Joint Manipulations    PT Next Visit Plan  Progress R knee flexion    PT Home Exercise Plan  [id number]    Consulted and Agree with Plan of Care  Patient       Patient will benefit from skilled therapeutic intervention in order to improve the following deficits and impairments:  Abnormal gait, Decreased balance, Decreased endurance, Decreased mobility, Difficulty walking, Pain, Postural dysfunction, Decreased strength, Decreased activity tolerance, Decreased range of motion, Improper body mechanics, Impaired flexibility, Hypomobility  Visit Diagnosis: Right knee pain, unspecified chronicity  Muscle weakness (generalized)  Difficulty in walking, not elsewhere classified  Joint stiffness of knee, right     Problem List Patient Active Problem List   Diagnosis Date Noted  . S/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, right 05/20/2019  . H/O total vaginal hysterectomy 03/12/2017  . Essential hypertension 03/08/2017  . Mixed dyslipidemia  03/08/2017  . Abnormal EKG 03/08/2017  . Preoperative cardiovascular examination 03/08/2017  . Allergic state 12/11/2013  . High cholesterol 12/11/2013  . History of depression 12/11/2013  . Hypothyroidism 12/11/2013  . Sleep apnea 12/11/2013   Pura Spice, PT, DPT # [id number] 07/07/2019, 12:53 PM  [place] [facility] [address] [place], [place], [address] Phone: [phone]   Fax:  [phone]  Name: Jordan Palmer MRN: [id number] Date of Birth: Sep 26, 1950

## 2019-07-09 ENCOUNTER — Encounter: Payer: Self-pay | Admitting: Physical Therapy

## 2019-07-09 ENCOUNTER — Other Ambulatory Visit: Payer: Self-pay

## 2019-07-09 ENCOUNTER — Ambulatory Visit: Payer: Medicare Other | Admitting: Physical Therapy

## 2019-07-09 DIAGNOSIS — M25561 Pain in right knee: Secondary | ICD-10-CM | POA: Diagnosis not present

## 2019-07-09 DIAGNOSIS — M25661 Stiffness of right knee, not elsewhere classified: Secondary | ICD-10-CM

## 2019-07-09 DIAGNOSIS — M6281 Muscle weakness (generalized): Secondary | ICD-10-CM

## 2019-07-09 DIAGNOSIS — R262 Difficulty in walking, not elsewhere classified: Secondary | ICD-10-CM

## 2019-07-09 NOTE — Therapy (Signed)
[place] [facility] [address]. [place], [place], [address] Phone: [phone]   Fax:  [phone]  Physical Therapy Treatment  Patient Details  Name: Jordan Palmer MRN: [id number] Date of Birth: 11-17-1950 Referring Provider (PT): Dr. Mack Guise   Encounter Date: 07/09/2019  PT End of Session - 07/09/19 1008    Visit Number  12    Number of Visits  17    Date for PT Re-Evaluation  07/23/19    Authorization - Visit Number  4    Authorization - Number of Visits  10    PT Start Time  0901    PT Stop Time  0959    PT Time Calculation (min)  58 min    Activity Tolerance  Patient tolerated treatment well;Patient limited by pain    Behavior During Therapy  [facility] for tasks assessed/performed       Past Medical History:  Diagnosis Date  . Arthritis   . Hyperlipemia   . Hypertension   . Hypothyroidism   . Sleep apnea   . Vertigo     Past Surgical History:  Procedure Laterality Date  . ANTERIOR AND POSTERIOR REPAIR N/A 03/12/2017   Procedure: ANTERIOR (CYSTOCELE) AND POSTERIOR REPAIR (RECTOCELE);  Surgeon: Salvadore Dom, MD;  [facility];  Service: Gynecology;  Laterality: N/A;  . CARPAL TUNNEL RELEASE Bilateral   . CHOLECYSTECTOMY    . CYSTOSCOPY N/A 03/12/2017   Procedure: CYSTOSCOPY;  Surgeon: Salvadore Dom, MD;  Location: [facility];  Service: Gynecology;  Laterality: N/A;  . EYE SURGERY    . KNEE ARTHROSCOPY Left   . KNEE ARTHROSCOPY WITH MEDIAL MENISECTOMY Right 02/13/2019   Procedure: KNEE ARTHROSCOPY WITH MEDIAL MENISECTOMY;  Surgeon: Thornton Park, MD;  Location: [facility];  Service: Orthopedics;  Laterality: Right;  . LACRIMAL DUCT PROBING W/ DACRYOPLASTY Bilateral   . SALPINGOOPHORECTOMY Left 03/12/2017   Procedure: SALPINGECTOMY;  Surgeon: Salvadore Dom, MD;  Location: [facility];  Service: Gynecology;  Laterality: Left;  . SHOULDER ARTHROSCOPY WITH  OPEN ROTATOR CUFF REPAIR Right 09/03/2014   Procedure: SHOULDER ARTHROSCOPY WITH OPEN ROTATOR CUFF REPAIR;  Surgeon: Thornton Park, MD;  Location: [facility];  Service: Orthopedics;  Laterality: Right;  . THYROIDECTOMY Right    hemi-thyroidectomy  . TOTAL KNEE ARTHROPLASTY Right 05/20/2019   Procedure: TOTAL KNEE ARTHROPLASTY;  Surgeon: Thornton Park, MD;  Location: [facility];  Service: Orthopedics;  Laterality: Right;  Marland Kitchen VAGINAL HYSTERECTOMY N/A 03/12/2017   Procedure: HYSTERECTOMY VAGINAL;  Surgeon: Salvadore Dom, MD;  Location: [facility];  Service: Gynecology;  Laterality: N/A;    There were no vitals filed for this visit.  Subjective Assessment - 07/09/19 1005    Subjective  Pt. states she is feeling like she's at a stand still with her knee.  Pt. continues to report persistent pain with knee flexion.    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  Pt. received PT prior to/ after knee scope this year.  See PT notes.    Limitations  Standing;Walking;House hold activities    Patient Stated Goals  Increase R knee ROM/ strength/ improve pain-free walking.    Currently in Pain?  Yes    Pain Score  4     Pain Location  Knee    Pain Orientation  Right    Pain Descriptors / Indicators  Aching;Sharp    Pain Type  Surgical pain          There Ex:  NuStep B UE/LE: 5 minutes on level 5, 5 minutes on level 4.  Discussed HEP/ knee flexion. Total Gym: ~25 reps squats, 5x10 sec gastroc stretch, 10 reps of calf raises Standing lunges on 12" step: ~10 reps on R knee   Manual:   STM quad: distal to proximal quad with free-up Patellar mobilizations sup/inf directions Heel slide AAROM R knee flex with belt: able to reach 112 degrees flexion, practiced deep breathing and story telling during greater degrees of flex.      PT Long Term Goals - 07/01/19 0814      PT LONG TERM GOAL #1   Title  Pt. will increase FOTO to 49 to improve  functional mobility.    Baseline  initial FOTO: 22.  5/26: 52 (goal met)    Time  4    Period  Weeks    Status  Achieved      PT LONG TERM GOAL #2   Title  Pt. will increase R knee AROM (0 to >115 deg.) to improve independence with gait.    Baseline  R knee AROM: -5 to 86 deg.  5/26: -2 to 106 deg.; 6/1    Time  4    Period  Weeks    Status  Partially Met      PT LONG TERM GOAL #3   Title  Patient will demonstrate improved strength of BLE as evidenced by 1/2 increase in MMT for increased function at home and in the community.    Baseline  R hip flexion/ abduction strength 4/5 MMT, R quad 4-/5 MMT, R hamstring 4/5 MMT. ; 6/1    Time  4    Period  Weeks    Status  Partially Met      PT LONG TERM GOAL #4   Title  Pt. able to ambulate with consistent 2-point gait pattern and use of SPC on level surfaces safely.    Baseline  pt. currently amb. with RW.  5/26: more normalized gait without SPC    Time  4    Period  Weeks    Status  Achieved      PT LONG TERM GOAL #5   Title  Pt. able to ambulate with consistent recip. pattern outside on grassy terrain with no issues to improve mobility at home.    Baseline  Pt. hesitant on uneven or grassy terrain.    Time  4    Period  Weeks    Status  New      PT LONG TERM GOAL #6   Title  Pt. able to ascend/ descend 10 stairs with recip. pattern and no UE assist to improve mobility.    Baseline  handrails required.    Time  4    Period  Weeks    Status  New            Plan - 07/09/19 1008    Clinical Impression Statement  Pt. was able to achieve greater R knee flexion (112 deg.) today in supine with belt assisted heel slide on table. Pt. was instructed to practice deep breathing or telling therapist a story when she felt pain coming on, which allowed for more flex in R knee.  Pt. is ambulating with more symmetrical, recip. pattern with consistent step pattern.  Pt. will continue to benefit from more therapy to decrease fear and pain  associated with greater degrees of R  knee flex./ stability.    Personal Factors and Comorbidities  Age;Behavior Pattern;Profession;Past/Current Experience;Time since onset of injury/illness/exacerbation;Fitness;Comorbidity 3+    Comorbidities  HTN, HLD, OA, sleep apnea, vertigo    Examination-Activity Limitations  Squat;Lift;Stairs;Stand;Locomotion Level;Sit;Transfers;Sleep;Bend;Bed Mobility    Stability/Clinical Decision Making  Evolving/Moderate complexity    Clinical Decision Making  Moderate    Rehab Potential  Good    PT Frequency  2x / week    PT Duration  4 weeks    PT Treatment/Interventions  ADLs/Self Care Home Management;Cryotherapy;Electrical Stimulation;Moist Heat;Stair training;Gait training;Therapeutic activities;Functional mobility training;Therapeutic exercise;Balance training;Orthotic Fit/Training;Patient/family education;Neuromuscular re-education;Manual techniques;Dry needling;Passive range of motion;Scar mobilization;Taping;Joint Manipulations    PT Next Visit Plan  Progress R knee flexion    PT Home Exercise Plan  [id number]    Consulted and Agree with Plan of Care  Patient       Patient will benefit from skilled therapeutic intervention in order to improve the following deficits and impairments:  Abnormal gait, Decreased balance, Decreased endurance, Decreased mobility, Difficulty walking, Pain, Postural dysfunction, Decreased strength, Decreased activity tolerance, Decreased range of motion, Improper body mechanics, Impaired flexibility, Hypomobility  Visit Diagnosis: Right knee pain, unspecified chronicity  Muscle weakness (generalized)  Difficulty in walking, not elsewhere classified  Joint stiffness of knee, right     Problem List Patient Active Problem List   Diagnosis Date Noted  . S/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, right 05/20/2019  . H/O total vaginal hysterectomy 03/12/2017  . Essential hypertension 03/08/2017  . Mixed dyslipidemia  03/08/2017  . Abnormal EKG 03/08/2017  . Preoperative cardiovascular examination 03/08/2017  . Allergic state 12/11/2013  . High cholesterol 12/11/2013  . History of depression 12/11/2013  . Hypothyroidism 12/11/2013  . Sleep apnea 12/11/2013    Pura Spice, PT, DPT # [id number] 07/09/2019, 10:42 AM  [place] [facility] [address] [place], [place], [address] Phone: [phone]   Fax:  [phone]  Name: Jordan Palmer MRN: [id number] Date of Birth: 03/16/1950

## 2019-07-14 ENCOUNTER — Ambulatory Visit: Payer: Medicare Other | Admitting: Physical Therapy

## 2019-07-14 ENCOUNTER — Encounter: Payer: Self-pay | Admitting: Physical Therapy

## 2019-07-14 ENCOUNTER — Other Ambulatory Visit: Payer: Self-pay

## 2019-07-14 DIAGNOSIS — M6281 Muscle weakness (generalized): Secondary | ICD-10-CM

## 2019-07-14 DIAGNOSIS — M25561 Pain in right knee: Secondary | ICD-10-CM | POA: Diagnosis not present

## 2019-07-14 DIAGNOSIS — R262 Difficulty in walking, not elsewhere classified: Secondary | ICD-10-CM

## 2019-07-14 DIAGNOSIS — M25661 Stiffness of right knee, not elsewhere classified: Secondary | ICD-10-CM

## 2019-07-14 NOTE — Therapy (Signed)
[place] [facility] [address] [place], [place], [address] Phone: [phone]   Fax:  [phone]  Physical Therapy Treatment  Patient Details  Name: Jordan Palmer MRN: [id number] Date of Birth: Mar 28, 1950 Referring Provider (PT): Dr. Mack Guise   Encounter Date: 07/14/2019   PT End of Session - 07/14/19 1111    Visit Number 13    Number of Visits 17    Date for PT Re-Evaluation 07/23/19    Authorization - Visit Number 5    Authorization - Number of Visits 10    PT Start Time 4268    PT Stop Time 0946    PT Time Calculation (min) 49 min    Activity Tolerance Patient tolerated treatment well;Patient limited by pain    Behavior During Therapy [facility] for tasks assessed/performed           Past Medical History:  Diagnosis Date   Arthritis    Hyperlipemia    Hypertension    Hypothyroidism    Sleep apnea    Vertigo     Past Surgical History:  Procedure Laterality Date   ANTERIOR AND POSTERIOR REPAIR N/A 03/12/2017   Procedure: ANTERIOR (CYSTOCELE) AND POSTERIOR REPAIR (RECTOCELE);  Surgeon: Salvadore Dom, MD;  Location: [facility];  Service: Gynecology;  Laterality: N/A;   CARPAL TUNNEL RELEASE Bilateral    CHOLECYSTECTOMY     CYSTOSCOPY N/A 03/12/2017   Procedure: CYSTOSCOPY;  Surgeon: Salvadore Dom, MD;  Location: [facility];  Service: Gynecology;  Laterality: N/A;   EYE SURGERY     KNEE ARTHROSCOPY Left    KNEE ARTHROSCOPY WITH MEDIAL MENISECTOMY Right 02/13/2019   Procedure: KNEE ARTHROSCOPY WITH MEDIAL MENISECTOMY;  Surgeon: Thornton Park, MD;  Location: [facility];  Service: Orthopedics;  Laterality: Right;   LACRIMAL DUCT PROBING W/ DACRYOPLASTY Bilateral    SALPINGOOPHORECTOMY Left 03/12/2017   Procedure: SALPINGECTOMY;  Surgeon: Salvadore Dom, MD;  Location: [facility];  Service: Gynecology;  Laterality: Left;   SHOULDER ARTHROSCOPY WITH  OPEN ROTATOR CUFF REPAIR Right 09/03/2014   Procedure: SHOULDER ARTHROSCOPY WITH OPEN ROTATOR CUFF REPAIR;  Surgeon: Thornton Park, MD;  Location: [facility];  Service: Orthopedics;  Laterality: Right;   THYROIDECTOMY Right    hemi-thyroidectomy   TOTAL KNEE ARTHROPLASTY Right 05/20/2019   Procedure: TOTAL KNEE ARTHROPLASTY;  Surgeon: Thornton Park, MD;  Location: [facility];  Service: Orthopedics;  Laterality: Right;   VAGINAL HYSTERECTOMY N/A 03/12/2017   Procedure: HYSTERECTOMY VAGINAL;  Surgeon: Salvadore Dom, MD;  Location: [facility];  Service: Gynecology;  Laterality: N/A;    There were no vitals filed for this visit.   Subjective Assessment - 07/14/19 1109    Subjective Pt. reports that she felt better after last session, but her knee is still limiting her particularly with going up stairs.    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  Pt. received PT prior to/ after knee scope this year.  See PT notes.    Limitations Standing;Walking;House hold activities    Patient Stated Goals Increase R knee ROM/ strength/ improve pain-free walking.    Currently in Pain? Yes    Pain Score 4     Pain Location Knee    Pain Orientation Right           Therex:   Nustep B UE/LE L4 10 minutes on seat position 4, reviewed HEP 12" step ups: 2x10  reps leading with R leg. Pt. was cued to maintain level pelvis, drive with her glutes, and to decrease hand support on railings Single leg taps on theraband in // bars: 10x10 sec holds alternating legs. Pt. was cued to decrease hand support on bars and to maintain level pelvis Bridging: 5x10 sec holds. Pt. instructed to add this to HEP.   Manual:   Patellar mobs: grade 4 inferior direction AAROM heel slide supine with belt: 3x~15 sec holds (114 deg. Flexion at best)       PT Long Term Goals - 07/01/19 0814      PT LONG TERM GOAL #1   Title Pt. will increase FOTO to 49 to improve functional  mobility.    Baseline initial FOTO: 22.  5/26: 52 (goal met)    Time 4    Period Weeks    Status Achieved      PT LONG TERM GOAL #2   Title Pt. will increase R knee AROM (0 to >115 deg.) to improve independence with gait.    Baseline R knee AROM: -5 to 86 deg.  5/26: -2 to 106 deg.; 6/1    Time 4    Period Weeks    Status Partially Met      PT LONG TERM GOAL #3   Title Patient will demonstrate improved strength of BLE as evidenced by 1/2 increase in MMT for increased function at home and in the community.    Baseline R hip flexion/ abduction strength 4/5 MMT, R quad 4-/5 MMT, R hamstring 4/5 MMT. ; 6/1    Time 4    Period Weeks    Status Partially Met      PT LONG TERM GOAL #4   Title Pt. able to ambulate with consistent 2-point gait pattern and use of SPC on level surfaces safely.    Baseline pt. currently amb. with RW.  5/26: more normalized gait without SPC    Time 4    Period Weeks    Status Achieved      PT LONG TERM GOAL #5   Title Pt. able to ambulate with consistent recip. pattern outside on grassy terrain with no issues to improve mobility at home.    Baseline Pt. hesitant on uneven or grassy terrain.    Time 4    Period Weeks    Status New      PT LONG TERM GOAL #6   Title Pt. able to ascend/ descend 10 stairs with recip. pattern and no UE assist to improve mobility.    Baseline handrails required.    Time 4    Period Weeks    Status New                 Plan - 07/14/19 1112    Clinical Impression Statement Pt. was able to achieve up to 114 deg. R knee flexion in supine with belt assisted heel slide on table. Pt. focused on single leg balance and glute/quad strengthening today, to which she noted she felt weak with. Pt. ambulated with more symmetry today, with a focus on more R knee flexion during swing through. Pt. would benefit from more therapy to address gait compensations, improve strength, and improve mobility of the R knee.    Personal Factors and  Comorbidities Age;Behavior Pattern;Profession;Past/Current Experience;Time since onset of injury/illness/exacerbation;Fitness;Comorbidity 3+    Comorbidities HTN, HLD, OA, sleep apnea, vertigo    Examination-Activity Limitations Squat;Lift;Stairs;Stand;Locomotion Level;Sit;Transfers;Sleep;Bend;Bed Mobility    Stability/Clinical Decision Making Evolving/Moderate complexity  Clinical Decision Making Moderate    Rehab Potential Good    PT Frequency 2x / week    PT Duration 4 weeks    PT Treatment/Interventions ADLs/Self Care Home Management;Cryotherapy;Electrical Stimulation;Moist Heat;Stair training;Gait training;Therapeutic activities;Functional mobility training;Therapeutic exercise;Balance training;Orthotic Fit/Training;Patient/family education;Neuromuscular re-education;Manual techniques;Dry needling;Passive range of motion;Scar mobilization;Taping;Joint Manipulations    PT Next Visit Plan Progress R knee flexion    PT Home Exercise Plan [id number]    Consulted and Agree with Plan of Care Patient           Patient will benefit from skilled therapeutic intervention in order to improve the following deficits and impairments:  Abnormal gait, Decreased balance, Decreased endurance, Decreased mobility, Difficulty walking, Pain, Postural dysfunction, Decreased strength, Decreased activity tolerance, Decreased range of motion, Improper body mechanics, Impaired flexibility, Hypomobility  Visit Diagnosis: Right knee pain, unspecified chronicity  Muscle weakness (generalized)  Difficulty in walking, not elsewhere classified  Joint stiffness of knee, right     Problem List Patient Active Problem List   Diagnosis Date Noted   S/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, right 05/20/2019   H/O total vaginal hysterectomy 03/12/2017   Essential hypertension 03/08/2017   Mixed dyslipidemia 03/08/2017   Abnormal EKG 03/08/2017   Preoperative cardiovascular examination 03/08/2017    Allergic state 12/11/2013   High cholesterol 12/11/2013   History of depression 12/11/2013   Hypothyroidism 12/11/2013   Sleep apnea 12/11/2013   Pura Spice, PT, DPT # [id number] Carlyle Basques, SPT 07/14/2019, 12:52 PM  [facility] [address] [place], [place], [address] Phone: [phone]   Fax:  [phone]  Name: Jordan Palmer MRN: [id number] Date of Birth: 10-13-1950

## 2019-07-16 ENCOUNTER — Other Ambulatory Visit: Payer: Self-pay

## 2019-07-16 ENCOUNTER — Encounter: Payer: Self-pay | Admitting: Physical Therapy

## 2019-07-16 ENCOUNTER — Ambulatory Visit: Payer: Medicare Other | Admitting: Physical Therapy

## 2019-07-16 DIAGNOSIS — R262 Difficulty in walking, not elsewhere classified: Secondary | ICD-10-CM

## 2019-07-16 DIAGNOSIS — M25661 Stiffness of right knee, not elsewhere classified: Secondary | ICD-10-CM

## 2019-07-16 DIAGNOSIS — M25561 Pain in right knee: Secondary | ICD-10-CM | POA: Diagnosis not present

## 2019-07-16 DIAGNOSIS — M6281 Muscle weakness (generalized): Secondary | ICD-10-CM

## 2019-07-16 NOTE — Therapy (Signed)
[facility] [address] [place], [place], [address] Phone: [phone]   Fax:  [phone]  Physical Therapy Treatment  Patient Details  Name: Jordan Palmer MRN: [id number] Date of Birth: 20-Dec-1950 Referring Provider (PT): Dr. Mack Guise   Encounter Date: 07/16/2019   PT End of Session - 07/16/19 0923    Visit Number 14    Number of Visits 17    Date for PT Re-Evaluation 07/23/19    Authorization - Visit Number 6    Authorization - Number of Visits 10    PT Start Time 0814    PT Stop Time 0912    PT Time Calculation (min) 58 min    Activity Tolerance Patient tolerated treatment well    Behavior During Therapy [facility] for tasks assessed/performed           Past Medical History:  Diagnosis Date   Arthritis    Hyperlipemia    Hypertension    Hypothyroidism    Sleep apnea    Vertigo     Past Surgical History:  Procedure Laterality Date   ANTERIOR AND POSTERIOR REPAIR N/A 03/12/2017   Procedure: ANTERIOR (CYSTOCELE) AND POSTERIOR REPAIR (RECTOCELE);  Surgeon: Salvadore Dom, MD;  Location: [facility];  Service: Gynecology;  Laterality: N/A;   CARPAL TUNNEL RELEASE Bilateral    CHOLECYSTECTOMY     CYSTOSCOPY N/A 03/12/2017   Procedure: CYSTOSCOPY;  Surgeon: Salvadore Dom, MD;  Location: [facility];  Service: Gynecology;  Laterality: N/A;   EYE SURGERY     KNEE ARTHROSCOPY Left    KNEE ARTHROSCOPY WITH MEDIAL MENISECTOMY Right 02/13/2019   Procedure: KNEE ARTHROSCOPY WITH MEDIAL MENISECTOMY;  Surgeon: Thornton Park, MD;  Location: [facility];  Service: Orthopedics;  Laterality: Right;   LACRIMAL DUCT PROBING W/ DACRYOPLASTY Bilateral    SALPINGOOPHORECTOMY Left 03/12/2017   Procedure: SALPINGECTOMY;  Surgeon: Salvadore Dom, MD;  Location: [facility];  Service: Gynecology;  Laterality: Left;   SHOULDER ARTHROSCOPY WITH OPEN ROTATOR CUFF REPAIR  Right 09/03/2014   Procedure: SHOULDER ARTHROSCOPY WITH OPEN ROTATOR CUFF REPAIR;  Surgeon: Thornton Park, MD;  Location: [facility];  Service: Orthopedics;  Laterality: Right;   THYROIDECTOMY Right    hemi-thyroidectomy   TOTAL KNEE ARTHROPLASTY Right 05/20/2019   Procedure: TOTAL KNEE ARTHROPLASTY;  Surgeon: Thornton Park, MD;  Location: [facility];  Service: Orthopedics;  Laterality: Right;   VAGINAL HYSTERECTOMY N/A 03/12/2017   Procedure: HYSTERECTOMY VAGINAL;  Surgeon: Salvadore Dom, MD;  Location: [facility];  Service: Gynecology;  Laterality: N/A;    There were no vitals filed for this visit.   Subjective Assessment - 07/16/19 0921    Subjective Pt. reports that her L knee has been hurting a bit, and that she was able to get into the pool since last visit. Pt. reports that she has been working hard at home to improve her R knee flexion.    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  Pt. received PT prior to/ after knee scope this year.  See PT notes.    Patient Stated Goals Increase R knee ROM/ strength/ improve pain-free walking.    Currently in Pain? Yes   No subjective pain score given            Ther. Ex.  Standing lunge stretch with R foot on stair, 10x10 sec stretch  // bars: Standing butt kicks: 10x  each leg Heel taps off 3" step: 2x10 each leg; tactile and verbal cueing for increased glute med activation to keep level pelvis for hip hinge Heel taps off airex balance beam: 2x10 each leg Walking lunges 6x: tactile and verbal cueing for increased glute med activation to keep level pelvis for hip hinge    Manual:  STM to R quad  Scar mobilization: during scar mobilization, small amount of pus and bleeding from distal end of incision. Small piece of stitch was removed from incision, followed by application of bandaids. Pt. was instructed to clean wound at home with soap and water, and to call doctor if the wound  looked red, had increased amounts of pus, experiencing increased pain, or had swelling in the area.  Supine belt AAROM R knee flex: pt. was able to achieve 115 degrees of flexion       PT Long Term Goals - 07/01/19 0814      PT LONG TERM GOAL #1   Title Pt. will increase FOTO to 49 to improve functional mobility.    Baseline initial FOTO: 22.  5/26: 52 (goal met)    Time 4    Period Weeks    Status Achieved      PT LONG TERM GOAL #2   Title Pt. will increase R knee AROM (0 to >115 deg.) to improve independence with gait.    Baseline R knee AROM: -5 to 86 deg.  5/26: -2 to 106 deg.; 6/1    Time 4    Period Weeks    Status Partially Met      PT LONG TERM GOAL #3   Title Patient will demonstrate improved strength of BLE as evidenced by 1/2 increase in MMT for increased function at home and in the community.    Baseline R hip flexion/ abduction strength 4/5 MMT, R quad 4-/5 MMT, R hamstring 4/5 MMT. ; 6/1    Time 4    Period Weeks    Status Partially Met      PT LONG TERM GOAL #4   Title Pt. able to ambulate with consistent 2-point gait pattern and use of SPC on level surfaces safely.    Baseline pt. currently amb. with RW.  5/26: more normalized gait without SPC    Time 4    Period Weeks    Status Achieved      PT LONG TERM GOAL #5   Title Pt. able to ambulate with consistent recip. pattern outside on grassy terrain with no issues to improve mobility at home.    Baseline Pt. hesitant on uneven or grassy terrain.    Time 4    Period Weeks    Status New      PT LONG TERM GOAL #6   Title Pt. able to ascend/ descend 10 stairs with recip. pattern and no UE assist to improve mobility.    Baseline handrails required.    Time 4    Period Weeks    Status New                 Plan - 07/16/19 8280    Clinical Impression Statement Pt. was able to achieve 115 deg. R knee flex in supine with belt assisted heel slide on table. Pt. completed exercises focused on quad  strength, stability, and improving balance. Pt. required cueing to maintain glute med activation throughout most exericses today. Pt. was able to ambulate wtih symmetrical gait pattern today when she was focusing on it. During  scar mobilization, small amount of pus and bleeding from distal end of incision. A small piece of stitch was removed from incision, followed by application of bandaids by PT. Pt. was instructed to clean wound at home with soap and water, and to call doctor if the wound looked red, had increased amounts of pus, experiencing increased pain, or had swelling in the area. Pt. would benefit from skilled PT to address strength deficits, improve balance, increase mobility of R knee, and improve gait.    Personal Factors and Comorbidities Profession;Past/Current Experience;Time since onset of injury/illness/exacerbation;Fitness;Comorbidity 3+;Behavior Pattern;Age    Comorbidities HTN, HLD, OA, sleep apnea, vertigo    Examination-Activity Limitations Squat;Lift;Stairs;Stand;Locomotion Level;Sit;Transfers;Sleep;Bend;Bed Mobility    Stability/Clinical Decision Making Evolving/Moderate complexity    Clinical Decision Making Moderate    Rehab Potential Good    PT Frequency 2x / week    PT Duration 4 weeks    PT Treatment/Interventions ADLs/Self Care Home Management;Cryotherapy;Electrical Stimulation;Moist Heat;Stair training;Gait training;Therapeutic activities;Functional mobility training;Therapeutic exercise;Balance training;Orthotic Fit/Training;Patient/family education;Neuromuscular re-education;Manual techniques;Dry needling;Passive range of motion;Scar mobilization;Taping;Joint Manipulations    PT Next Visit Plan Glute med strengthening and single leg stabilization.  Reassess distal incision.    PT Home Exercise Plan [facility]    Consulted and Agree with Plan of Care Patient           Patient will benefit from skilled therapeutic intervention in order to improve the following deficits  and impairments:  Abnormal gait, Decreased balance, Decreased endurance, Decreased mobility, Difficulty walking, Pain, Postural dysfunction, Decreased strength, Decreased activity tolerance, Decreased range of motion, Improper body mechanics, Impaired flexibility, Hypomobility  Visit Diagnosis: Right knee pain, unspecified chronicity  Muscle weakness (generalized)  Difficulty in walking, not elsewhere classified  Joint stiffness of knee, right     Problem List Patient Active Problem List   Diagnosis Date Noted   S/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, right 05/20/2019   H/O total vaginal hysterectomy 03/12/2017   Essential hypertension 03/08/2017   Mixed dyslipidemia 03/08/2017   Abnormal EKG 03/08/2017   Preoperative cardiovascular examination 03/08/2017   Allergic state 12/11/2013   High cholesterol 12/11/2013   History of depression 12/11/2013   Hypothyroidism 12/11/2013   Sleep apnea 12/11/2013   Pura Spice, PT, DPT # [id number] Carlyle Basques, SPT 07/16/2019, 10:01 AM  [facility] [address] [place], [place], [address] Phone: [phone]   Fax:  [phone]  Name: Jordan Palmer MRN: [id number] Date of Birth: [birth year]

## 2019-07-21 ENCOUNTER — Ambulatory Visit: Payer: Medicare Other | Admitting: Physical Therapy

## 2019-07-21 ENCOUNTER — Encounter: Payer: Self-pay | Admitting: Physical Therapy

## 2019-07-21 ENCOUNTER — Other Ambulatory Visit: Payer: Self-pay

## 2019-07-21 DIAGNOSIS — M25661 Stiffness of right knee, not elsewhere classified: Secondary | ICD-10-CM

## 2019-07-21 DIAGNOSIS — R262 Difficulty in walking, not elsewhere classified: Secondary | ICD-10-CM

## 2019-07-21 DIAGNOSIS — M25561 Pain in right knee: Secondary | ICD-10-CM | POA: Diagnosis not present

## 2019-07-21 DIAGNOSIS — M6281 Muscle weakness (generalized): Secondary | ICD-10-CM

## 2019-07-21 NOTE — Therapy (Signed)
[facility] [address]. [place], [place], [address] Phone: [phone]   Fax:  [phone]  Physical Therapy Treatment  Patient Details  Name: Jordan Palmer MRN: [id number] Date of Birth: 01/06/1951 Referring Provider (PT): Dr. Mack Guise   Encounter Date: 07/21/2019   PT End of Session - 07/21/19 1200    Visit Number 15    Number of Visits 17    Date for PT Re-Evaluation 07/23/19    Authorization - Visit Number 7    Authorization - Number of Visits 10    PT Start Time 1025    PT Stop Time 1123    PT Time Calculation (min) 58 min    Activity Tolerance Patient tolerated treatment well    Behavior During Therapy [facility] for tasks assessed/performed           Past Medical History:  Diagnosis Date  . Arthritis   . Hyperlipemia   . Hypertension   . Hypothyroidism   . Sleep apnea   . Vertigo     Past Surgical History:  Procedure Laterality Date  . ANTERIOR AND POSTERIOR REPAIR N/A 03/12/2017   Procedure: ANTERIOR (CYSTOCELE) AND POSTERIOR REPAIR (RECTOCELE);  Surgeon: Salvadore Dom, MD;  Location: [facility];  Service: Gynecology;  Laterality: N/A;  . CARPAL TUNNEL RELEASE Bilateral   . CHOLECYSTECTOMY    . CYSTOSCOPY N/A 03/12/2017   Procedure: CYSTOSCOPY;  Surgeon: Salvadore Dom, MD;  Location: [facility];  Service: Gynecology;  Laterality: N/A;  . EYE SURGERY    . KNEE ARTHROSCOPY Left   . KNEE ARTHROSCOPY WITH MEDIAL MENISECTOMY Right 02/13/2019   Procedure: KNEE ARTHROSCOPY WITH MEDIAL MENISECTOMY;  Surgeon: Thornton Park, MD;  Location: [facility];  Service: Orthopedics;  Laterality: Right;  . LACRIMAL DUCT PROBING W/ DACRYOPLASTY Bilateral   . SALPINGOOPHORECTOMY Left 03/12/2017   Procedure: SALPINGECTOMY;  Surgeon: Salvadore Dom, MD;  Location: [facility];  Service: Gynecology;  Laterality: Left;  . SHOULDER ARTHROSCOPY WITH OPEN ROTATOR CUFF REPAIR  Right 09/03/2014   Procedure: SHOULDER ARTHROSCOPY WITH OPEN ROTATOR CUFF REPAIR;  Surgeon: Thornton Park, MD;  Location: [facility];  Service: Orthopedics;  Laterality: Right;  . THYROIDECTOMY Right    hemi-thyroidectomy  . TOTAL KNEE ARTHROPLASTY Right 05/20/2019   Procedure: TOTAL KNEE ARTHROPLASTY;  Surgeon: Thornton Park, MD;  Location: [facility];  Service: Orthopedics;  Laterality: Right;  Marland Kitchen VAGINAL HYSTERECTOMY N/A 03/12/2017   Procedure: HYSTERECTOMY VAGINAL;  Surgeon: Salvadore Dom, MD;  Location: [facility];  Service: Gynecology;  Laterality: N/A;    There were no vitals filed for this visit.   Subjective Assessment - 07/21/19 1157    Subjective Pt. reports that she has been feeling a lot better over the past week with her R knee, reporting it's easier to get in/out of the car and easier to walk. Pt. went to the doctor last Friday over concerns of stitch sites oozing, and Dr. prescribed 10 day antibiotic course which pt. reports having started. Pt. will return to MD to have stitch sites assessed on July 7.    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  Pt. received PT prior to/ after knee scope this year.  See PT notes.    Patient Stated Goals Increase R knee ROM/ strength/ improve pain-free walking.    Currently in Pain? Yes    Pain Score 3  Pain Location Knee    Pain Orientation Right            Ther Ex.  Nustep: 10 mins L4. Discussed plan during Standing lunge stretch on stairs R foot on step: 3x15 sec STS with chair: 10x. Pt. cued on maintaining proper knee alignment during, progressed to having RTB around knees to maintain adequate hip abduction during   STS with 5lb weighted ball toss: 10x  Walking lunges in // bars: 5x: Pt. mentioned left knee causing some discomfort during. Pt. cued on maintaining proper form and balance throughout Resisted forward/backwards walking in [place]: 5x with 50 lbs on single  arm  Manual:  STM to R quad Supine AAROM R heel slide with gait belt around foot: Pt. achieved 117 deg R knee flex today.        PT Long Term Goals - 07/01/19 0814      PT LONG TERM GOAL #1   Title Pt. will increase FOTO to 49 to improve functional mobility.    Baseline initial FOTO: 22.  5/26: 52 (goal met)    Time 4    Period Weeks    Status Achieved      PT LONG TERM GOAL #2   Title Pt. will increase R knee AROM (0 to >115 deg.) to improve independence with gait.    Baseline R knee AROM: -5 to 86 deg.  5/26: -2 to 106 deg.; 6/1    Time 4    Period Weeks    Status Partially Met      PT LONG TERM GOAL #3   Title Patient will demonstrate improved strength of BLE as evidenced by 1/2 increase in MMT for increased function at home and in the community.    Baseline R hip flexion/ abduction strength 4/5 MMT, R quad 4-/5 MMT, R hamstring 4/5 MMT. ; 6/1    Time 4    Period Weeks    Status Partially Met      PT LONG TERM GOAL #4   Title Pt. able to ambulate with consistent 2-point gait pattern and use of SPC on level surfaces safely.    Baseline pt. currently amb. with RW.  5/26: more normalized gait without SPC    Time 4    Period Weeks    Status Achieved      PT LONG TERM GOAL #5   Title Pt. able to ambulate with consistent recip. pattern outside on grassy terrain with no issues to improve mobility at home.    Baseline Pt. hesitant on uneven or grassy terrain.    Time 4    Period Weeks    Status New      PT LONG TERM GOAL #6   Title Pt. able to ascend/ descend 10 stairs with recip. pattern and no UE assist to improve mobility.    Baseline handrails required.    Time 4    Period Weeks    Status New                 Plan - 07/21/19 1201    Clinical Impression Statement Pt. was able to achieve 117 deg. R knee flex in supine with belt assisted heel slide on table. Pt. completed exercises focusing on quad stability and power today. Pt. cued for maintaining proper  form for lunges and squats for not allowing knee to pass toes and to maintain adequate hip abduction during. Pt. was able to ambulate with improved symmetry and R knee flex during swing  phase. Pt. will continue to benefit from skilled PT to improve strength, balance, and mobility of the R knee.    Personal Factors and Comorbidities Profession;Past/Current Experience;Time since onset of injury/illness/exacerbation;Fitness;Comorbidity 3+;Behavior Pattern;Age    Comorbidities HTN, HLD, OA, sleep apnea, vertigo    Examination-Activity Limitations Squat;Lift;Stairs;Stand;Locomotion Level;Sit;Transfers;Sleep;Bend;Bed Mobility    Stability/Clinical Decision Making Evolving/Moderate complexity    Clinical Decision Making Moderate    Rehab Potential Good    PT Frequency 2x / week    PT Duration 4 weeks    PT Treatment/Interventions ADLs/Self Care Home Management;Cryotherapy;Electrical Stimulation;Moist Heat;Stair training;Gait training;Therapeutic activities;Functional mobility training;Therapeutic exercise;Balance training;Orthotic Fit/Training;Patient/family education;Neuromuscular re-education;Manual techniques;Dry needling;Passive range of motion;Scar mobilization;Taping;Joint Manipulations    PT Next Visit Plan Glute med strengthening and single leg stabilization.  Reassess distal incision.    PT Home Exercise Plan [facility]    Consulted and Agree with Plan of Care Patient           Patient will benefit from skilled therapeutic intervention in order to improve the following deficits and impairments:  Abnormal gait, Decreased balance, Decreased endurance, Decreased mobility, Difficulty walking, Pain, Postural dysfunction, Decreased strength, Decreased activity tolerance, Decreased range of motion, Improper body mechanics, Impaired flexibility, Hypomobility  Visit Diagnosis: Right knee pain, unspecified chronicity  Muscle weakness (generalized)  Difficulty in walking, not elsewhere  classified  Joint stiffness of knee, right     Problem List Patient Active Problem List   Diagnosis Date Noted  . S/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, right 05/20/2019  . H/O total vaginal hysterectomy 03/12/2017  . Essential hypertension 03/08/2017  . Mixed dyslipidemia 03/08/2017  . Abnormal EKG 03/08/2017  . Preoperative cardiovascular examination 03/08/2017  . Allergic state 12/11/2013  . High cholesterol 12/11/2013  . History of depression 12/11/2013  . Hypothyroidism 12/11/2013  . Sleep apnea 12/11/2013   Pura Spice, PT, DPT # [id number], SPT 07/21/2019, 2:01 PM  [facility] [address] [place], [place], [address] Phone: [phone]   Fax:  [phone]  Name: BREANDA GREENLAW MRN: [id number] Date of Birth: 08/03/1950

## 2019-07-23 ENCOUNTER — Encounter: Payer: Self-pay | Admitting: Physical Therapy

## 2019-07-23 ENCOUNTER — Ambulatory Visit: Payer: Medicare Other | Admitting: Physical Therapy

## 2019-07-23 ENCOUNTER — Other Ambulatory Visit: Payer: Self-pay

## 2019-07-23 DIAGNOSIS — M6281 Muscle weakness (generalized): Secondary | ICD-10-CM

## 2019-07-23 DIAGNOSIS — M25561 Pain in right knee: Secondary | ICD-10-CM | POA: Diagnosis not present

## 2019-07-23 DIAGNOSIS — M25661 Stiffness of right knee, not elsewhere classified: Secondary | ICD-10-CM

## 2019-07-23 DIAGNOSIS — R262 Difficulty in walking, not elsewhere classified: Secondary | ICD-10-CM

## 2019-07-23 DIAGNOSIS — Z96651 Presence of right artificial knee joint: Secondary | ICD-10-CM

## 2019-07-23 NOTE — Therapy (Signed)
[facility] [address] [place], [place], [address] Phone: [phone]   Fax:  [phone]  Physical Therapy Treatment/Discharge  Patient Details  Name: Jordan Palmer MRN: [id number] Date of Birth: 1950-12-27 Referring Provider (PT): Dr. Mack Guise   Encounter Date: 07/23/2019   PT End of Session - 07/23/19 0907    Visit Number 16    Number of Visits 17    Date for PT Re-Evaluation 07/23/19    Authorization - Visit Number 8    Authorization - Number of Visits 10    PT Start Time 0803    PT Stop Time 5462    PT Time Calculation (min) 54 min    Activity Tolerance Patient tolerated treatment well    Behavior During Therapy [facility] for tasks assessed/performed           Past Medical History:  Diagnosis Date  . Arthritis   . Hyperlipemia   . Hypertension   . Hypothyroidism   . Sleep apnea   . Vertigo     Past Surgical History:  Procedure Laterality Date  . ANTERIOR AND POSTERIOR REPAIR N/A 03/12/2017   Procedure: ANTERIOR (CYSTOCELE) AND POSTERIOR REPAIR (RECTOCELE);  Surgeon: Salvadore Dom, MD;  Location[facility];  Service: Gynecology;  Laterality: N/A;  . CARPAL TUNNEL RELEASE Bilateral   . CHOLECYSTECTOMY    . CYSTOSCOPY N/A 03/12/2017   Procedure: CYSTOSCOPY;  Surgeon: Salvadore Dom, MD;  Location: [facility];  Service: Gynecology;  Laterality: N/A;  . EYE SURGERY    . KNEE ARTHROSCOPY Left   . KNEE ARTHROSCOPY WITH MEDIAL MENISECTOMY Right 02/13/2019   Procedure: KNEE ARTHROSCOPY WITH MEDIAL MENISECTOMY;  Surgeon: Thornton Park, MD;  Location: [facility];  Service: Orthopedics;  Laterality: Right;  . LACRIMAL DUCT PROBING W/ DACRYOPLASTY Bilateral   . SALPINGOOPHORECTOMY Left 03/12/2017   Procedure: SALPINGECTOMY;  Surgeon: Salvadore Dom, MD;  Location: [facility];  Service: Gynecology;  Laterality: Left;  . SHOULDER ARTHROSCOPY WITH OPEN ROTATOR  CUFF REPAIR Right 09/03/2014   Procedure: SHOULDER ARTHROSCOPY WITH OPEN ROTATOR CUFF REPAIR;  Surgeon: Thornton Park, MD;  Location: [facility];  Service: Orthopedics;  Laterality: Right;  . THYROIDECTOMY Right    hemi-thyroidectomy  . TOTAL KNEE ARTHROPLASTY Right 05/20/2019   Procedure: TOTAL KNEE ARTHROPLASTY;  Surgeon: Thornton Park, MD;  Location: [facility];  Service: Orthopedics;  Laterality: Right;  Marland Kitchen VAGINAL HYSTERECTOMY N/A 03/12/2017   Procedure: HYSTERECTOMY VAGINAL;  Surgeon: Salvadore Dom, MD;  Location: [facility];  Service: Gynecology;  Laterality: N/A;    There were no vitals filed for this visit.   Subjective Assessment - 07/23/19 0905    Subjective Pt. reports that her R knee is feeling much better and that she was able to complete housework and baking at home without breaks.    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  Pt. received PT prior to/ after knee scope this year.  See PT notes.    Limitations Standing;Walking;House hold activities    Patient Stated Goals Increase R knee ROM/ strength/ improve pain-free walking.    Currently in Pain? Yes    Pain Score 3     Pain Location Knee    Pain Orientation Right            Ther. Ex:  Nustep: 10 mins L5 STS with RTB around knees // bars: Heel taps off 3"  plinth: cueing for proper form and maintaining level pelvis Lateral, forward, and backward step ups 6": cueing for proper hip form and knee positioning 15 reps each leg and direction Walking on uneven surfaces outdoors: maintaining reciprocal and even gait pattern, no LOB noted Stairs: reciprocal gait pattern with no hands, no LOB noted 3x     Manual:   L LE long axis distraction L knee patellar mobs in all directions: 5x grade 4 L fibular ant to post and post to ant glides: 5x grade 4       PT Education - 07/24/19 0932    Education Details See updated HEP    Person(s) Educated Patient    Methods  Explanation;Demonstration;Handout    Comprehension Verbalized understanding;Returned demonstration               PT Long Term Goals - 07/23/19 0916      PT LONG TERM GOAL #1   Title Pt. will increase FOTO to 49 to improve functional mobility.    Baseline initial FOTO: 22.  5/26: 52 (goal met)    Time 4    Period Weeks    Status Achieved      PT LONG TERM GOAL #2   Title Pt. will increase R knee AROM (0 to >115 deg.) to improve independence with gait.    Baseline R knee AROM: -5 to 86 deg.  5/26: -2 to 106 deg.; 6/23: 0 to 111 deg    Time 4    Period Weeks    Status Partially Met    Target Date 07/23/19      PT LONG TERM GOAL #3   Title Patient will demonstrate improved strength of BLE as evidenced by 1/2 increase in MMT for increased function at home and in the community.    Baseline R hip flexion/ abduction strength 4/5 MMT, R quad 4-/5 MMT, R hamstring 4/5 MMT. ; 6/1    Time 4    Period Weeks    Status Partially Met      PT LONG TERM GOAL #4   Title Pt. able to ambulate with consistent 2-point gait pattern and use of SPC on level surfaces safely.    Baseline pt. currently amb. with RW.  5/26: more normalized gait without SPC    Time 4    Period Weeks    Status Achieved      PT LONG TERM GOAL #5   Title Pt. able to ambulate with consistent recip. pattern outside on grassy terrain with no issues to improve mobility at home.    Baseline Pt. hesitant on uneven or grassy terrain.    Time 4    Period Weeks    Status Achieved    Target Date 07/23/19      PT LONG TERM GOAL #6   Title Pt. able to ascend/ descend 10 stairs with recip. pattern and no UE assist to improve mobility.    Baseline handrails required.    Time 4    Period Weeks    Status Achieved    Target Date 07/23/19               Plan - 07/23/19 0909    Clinical Impression Statement Pt. was able to achieve 111 deg R knee flex AROM today and >117 deg. flexion with AAROM (belt).  Pt. was instructed  in thorough HEP today with exercises focusing on quad strength and hip control to progress gains made in strength. Pt.'s goals were assessed and she  has met most of them. Currently, pt. is most limited by L knee pain during session today. Pt. reported feeling confident in completing HEP and continuing to progress her goals. Pt. instructed to call clinic in case of R knee worsening.    Personal Factors and Comorbidities Profession;Past/Current Experience;Time since onset of injury/illness/exacerbation;Fitness;Comorbidity 3+;Behavior Pattern;Age    Comorbidities HTN, HLD, OA, sleep apnea, vertigo    Examination-Activity Limitations Squat;Lift;Stairs;Stand;Locomotion Level;Sit;Transfers;Sleep;Bend;Bed Mobility    Stability/Clinical Decision Making Evolving/Moderate complexity    Clinical Decision Making Moderate    Rehab Potential Good    PT Frequency 2x / week    PT Duration 4 weeks    PT Treatment/Interventions ADLs/Self Care Home Management;Cryotherapy;Electrical Stimulation;Moist Heat;Stair training;Gait training;Therapeutic activities;Functional mobility training;Therapeutic exercise;Balance training;Orthotic Fit/Training;Patient/family education;Neuromuscular re-education;Manual techniques;Dry needling;Passive range of motion;Scar mobilization;Taping;Joint Manipulations    PT Next Visit Plan Discharge visit.  Pt. instructed to contact PT if any questions or regression in symptoms over next several weeks.    PT Home Exercise Plan [facility]    Consulted and Agree with Plan of Care Patient           Patient will benefit from skilled therapeutic intervention in order to improve the following deficits and impairments:  Abnormal gait, Decreased balance, Decreased endurance, Decreased mobility, Difficulty walking, Pain, Postural dysfunction, Decreased strength, Decreased activity tolerance, Decreased range of motion, Improper body mechanics, Impaired flexibility, Hypomobility  Visit Diagnosis: Right  knee pain, unspecified chronicity  Muscle weakness (generalized)  Difficulty in walking, not elsewhere classified  Joint stiffness of knee, right  S/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, right     Problem List Patient Active Problem List   Diagnosis Date Noted  . S/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, right 05/20/2019  . H/O total vaginal hysterectomy 03/12/2017  . Essential hypertension 03/08/2017  . Mixed dyslipidemia 03/08/2017  . Abnormal EKG 03/08/2017  . Preoperative cardiovascular examination 03/08/2017  . Allergic state 12/11/2013  . High cholesterol 12/11/2013  . History of depression 12/11/2013  . Hypothyroidism 12/11/2013  . Sleep apnea 12/11/2013   Pura Spice, PT, DPT # [id number], SPT 07/24/2019, 9:36 AM  [place] [facility] [address] [place], [place], [address] Phone: [phone]   Fax:  [phone]  Name: JACQUELENE KOPECKY MRN: [id number] Date of Birth: 30-Jul-1950

## 2019-07-23 NOTE — Patient Instructions (Signed)
Access Code: [email or web address]: [email or web address]: 06/23/2021Prepared by: Legrand Como SherkExercises  Supine Quad Set - 2 x daily - 7 x weekly - 1 sets - 10 reps - 10 seconds hold  Supine Heel Slide with Strap - 2 x daily - 7 x weekly - 1 sets - 10 reps  Bridge - 1 x daily - 7 x weekly - 10 reps - 3 sets  Standing Heel Raises - 2 x daily - 7 x weekly - 2 sets - 10 reps  Step Up - 1 x daily - 7 x weekly - 10 reps - 3 sets  Mini Lunge - 1 x daily - 7 x weekly - 10 reps - 3 sets  Forward Step Down - 1 x daily - 7 x weekly - 3 sets - 10 reps  Lateral Step Up - 1 x daily - 7 x weekly - 3 sets - 10 reps  Sit to Stand - 1 x daily - 7 x weekly - 3 sets - 10 reps

## 2019-07-23 NOTE — Therapy (Deleted)
[facility] [address] [place], [place], [address] Phone: [phone]   Fax:  [phone]  July 23, 2019   No Recipients  Physical Therapy Discharge Summary  Patient: Jordan Palmer  MRN: [id number]  Date of Birth: May 16, 1950   Diagnosis: Right knee pain, unspecified chronicity  Muscle weakness (generalized)  Difficulty in walking, not elsewhere classified  Joint stiffness of knee, right  S/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, right Referring Provider (PT): Dr. Mack Guise   The above patient had been seen in Physical Therapy *** times of *** treatments scheduled with *** no shows and *** cancellations.  The treatment consisted of *** The patient is: {improved/worse/unchanged:3041574}  Subjective:   Discharge Findings: ***  Functional Status at Discharge: ***  {ONGEX:5284132}   Plan - 07/23/19 0909    Clinical Impression Statement Pt. was able to achieve 111 deg R knee flex AROM today. Pt. was instructed in thorough HEP today with exercises focusing on quad strength and hip control to progress gains made in strength. Pt.'s goals were assessed and she has met most of them. Currently, Pt. is most limited by L knee pain during session today. Pt. reported feeling confident in completing HEP and continuing to progress her goals. Pt. instructed to call clinic in case of R knee worsening.    Personal Factors and Comorbidities Profession;Past/Current Experience;Time since onset of injury/illness/exacerbation;Fitness;Comorbidity 3+;Behavior Pattern;Age    Comorbidities HTN, HLD, OA, sleep apnea, vertigo    Examination-Activity Limitations Squat;Lift;Stairs;Stand;Locomotion Level;Sit;Transfers;Sleep;Bend;Bed Mobility    Stability/Clinical Decision Making Evolving/Moderate complexity    Clinical Decision Making Moderate    Rehab Potential Good    PT Frequency 2x / week    PT Duration 4 weeks    PT Treatment/Interventions  ADLs/Self Care Home Management;Cryotherapy;Electrical Stimulation;Moist Heat;Stair training;Gait training;Therapeutic activities;Functional mobility training;Therapeutic exercise;Balance training;Orthotic Fit/Training;Patient/family education;Neuromuscular re-education;Manual techniques;Dry needling;Passive range of motion;Scar mobilization;Taping;Joint Manipulations    PT Next Visit Plan Glute med strengthening and single leg stabilization.  Reassess distal incision.    PT Home Exercise Plan [facility]    Consulted and Agree with Plan of Care Patient           Sincerely,   Pura Spice, PT   CC No Recipients  [facility] [address] [place], [place], [address] Phone: [phone]   Fax:  [phone]  Patient: Jordan Palmer  MRN: [id number]  Date of Birth: [birth year]

## 2019-12-01 HISTORY — PX: KNEE ARTHROSCOPY: SUR90

## 2019-12-19 ENCOUNTER — Encounter
Admission: RE | Admit: 2019-12-19 | Discharge: 2019-12-19 | Disposition: A | Discharge status: Home / Self Care | Payer: Medicare Other | Source: Ambulatory Visit | Attending: Orthopedic Surgery | Admitting: Orthopedic Surgery

## 2019-12-19 ENCOUNTER — Other Ambulatory Visit: Payer: Self-pay

## 2019-12-19 ENCOUNTER — Other Ambulatory Visit: Payer: Self-pay | Admitting: Orthopedic Surgery

## 2019-12-19 DIAGNOSIS — Z20822 Contact with and (suspected) exposure to covid-19: Secondary | ICD-10-CM | POA: Insufficient documentation

## 2019-12-19 DIAGNOSIS — Z01818 Encounter for other preprocedural examination: Secondary | ICD-10-CM | POA: Insufficient documentation

## 2019-12-19 DIAGNOSIS — I10 Essential (primary) hypertension: Secondary | ICD-10-CM | POA: Diagnosis not present

## 2019-12-19 LAB — TYPE AND SCREEN
ABO/RH(D): A NEG
Antibody Screen: NEGATIVE

## 2019-12-19 LAB — CBC WITH DIFFERENTIAL/PLATELET
Abs Immature Granulocytes: 0.03 10*3/uL (ref 0.00–0.07)
Basophils Absolute: 0.1 10*3/uL (ref 0.0–0.1)
Basophils Relative: 1 %
Eosinophils Absolute: 0.2 10*3/uL (ref 0.0–0.5)
Eosinophils Relative: 3 %
HCT: 32 % — ABNORMAL LOW (ref 36.0–46.0)
Hemoglobin: 10.8 g/dL — ABNORMAL LOW (ref 12.0–15.0)
Immature Granulocytes: 1 %
Lymphocytes Relative: 30 %
Lymphs Abs: 1.7 10*3/uL (ref 0.7–4.0)
MCH: 32.2 pg (ref 26.0–34.0)
MCHC: 33.8 g/dL (ref 30.0–36.0)
MCV: 95.5 fL (ref 80.0–100.0)
Monocytes Absolute: 0.4 10*3/uL (ref 0.1–1.0)
Monocytes Relative: 8 %
Neutro Abs: 3.1 10*3/uL (ref 1.7–7.7)
Neutrophils Relative %: 57 %
Platelets: 235 10*3/uL (ref 150–400)
RBC: 3.35 MIL/uL — ABNORMAL LOW (ref 3.87–5.11)
RDW: 12.5 % (ref 11.5–15.5)
WBC: 5.5 10*3/uL (ref 4.0–10.5)
nRBC: 0 % (ref 0.0–0.2)

## 2019-12-19 LAB — BASIC METABOLIC PANEL
Anion gap: 10 (ref 5–15)
BUN: 28 mg/dL — ABNORMAL HIGH (ref 8–23)
CO2: 26 mmol/L (ref 22–32)
Calcium: 9 mg/dL (ref 8.9–10.3)
Chloride: 103 mmol/L (ref 98–111)
Creatinine, Ser: 0.84 mg/dL (ref 0.44–1.00)
GFR, Estimated: >60 mL/min (ref >60)
Glucose, Bld: 95 mg/dL (ref 70–99)
Potassium: 4.2 mmol/L (ref 3.5–5.1)
Sodium: 139 mmol/L (ref 135–145)

## 2019-12-19 LAB — URINALYSIS, COMPLETE (UACMP) WITH MICROSCOPIC
Bilirubin Urine: NEGATIVE
Glucose, UA: NEGATIVE mg/dL
Hgb urine dipstick: NEGATIVE
Ketones, ur: NEGATIVE mg/dL
Leukocytes,Ua: NEGATIVE
Nitrite: NEGATIVE
Protein, ur: NEGATIVE mg/dL
Specific Gravity, Urine: 1.027 (ref 1.005–1.030)
pH: 5 (ref 5.0–8.0)

## 2019-12-19 LAB — PROTIME-INR
INR: 1 (ref 0.8–1.2)
Prothrombin Time: 12.4 seconds (ref 11.4–15.2)

## 2019-12-19 LAB — SURGICAL PCR SCREEN
MRSA, PCR: NEGATIVE
Staphylococcus aureus: NEGATIVE

## 2019-12-19 LAB — APTT: aPTT: 31 seconds (ref 24–36)

## 2019-12-19 NOTE — Patient Instructions (Signed)
Your procedure is scheduled on: 12/23/19 Report to [place] AFTER CHECK IN AT [place]. To find out your arrival time please call [phone] between 1PM - 3PM on 12/22/19.  Remember: Instructions that are not followed completely may result in serious medical risk, up to and including death, or upon the discretion of your surgeon and anesthesiologist your surgery may need to be rescheduled.     _X__ 1. Do not eat food after midnight the night before your procedure.                 No gum chewing or hard candies. You may drink clear liquids up to 2 hours                 before you are scheduled to arrive for your surgery- DO not drink clear                 liquids within 2 hours of the start of your surgery.                 Clear Liquids include:  water, apple juice without pulp, clear carbohydrate                 drink such as Clearfast or Gatorade, Black Coffee or Tea (Do not add                 anything to coffee or tea). Diabetics water only  __X__2.  On the morning of surgery brush your teeth with toothpaste and water, you                 may rinse your mouth with mouthwash if you wish.  Do not swallow any              toothpaste of mouthwash.     _X__ 3.  No Alcohol for 24 hours before or after surgery.   _X__ 4.  Do Not Smoke or use e-cigarettes For 24 Hours Prior to Your Surgery.                 Do not use any chewable tobacco products for at least 6 hours prior to                 surgery.  ____  5.  Bring all medications with you on the day of surgery if instructed.   __X__  6.  Notify your doctor if there is any change in your medical condition      (cold, fever, infections).     Do not wear jewelry, make-up, hairpins, clips or nail polish. Do not wear lotions, powders, or perfumes.  Do not shave 48 hours prior to surgery. Men may shave face and neck. Do not bring valuables to the hospital.    [facility] is  not responsible for any belongings or valuables.  Contacts, dentures/partials or body piercings may not be worn into surgery. Bring a case for your contacts, glasses or hearing aids, a denture cup will be supplied. Leave your suitcase in the car. After surgery it may be brought to your room. For patients admitted to the hospital, discharge time is determined by your treatment team.   Patients discharged the day of surgery will not be allowed to drive home.   Please read over the following fact sheets that you were given:   MRSA Information  __X__ Take these medicines the morning of surgery with  A SIP OF WATER:    1. levothyroxine (SYNTHROID) 88 MCG tablet  2. rosuvastatin (CRESTOR) 5 MG tablet  3. traMADol (ULTRAM) 50 MG tablet if needed  4. fexofenadine (ALLEGRA) 180 MG tablet if needed  5.  6.  ____ Fleet Enema (as directed)   __X__ Use CHG Soap/SAGE wipes as directed  ____ Use inhalers on the day of surgery  ____ Stop metformin/Janumet/Farxiga 2 days prior to surgery    ____ Take 1/2 of usual insulin dose the night before surgery. No insulin the morning          of surgery.   ____ Stop Blood Thinners Coumadin/Plavix/Xarelto/Pleta/Pradaxa/Eliquis/Effient/Aspirin  on   Or contact your Surgeon, Cardiologist or Medical Doctor regarding  ability to stop your blood thinners  __X__ Stop Anti-inflammatories 7 days before surgery such as Advil, Ibuprofen, Motrin,  BC or Goodies Powder, Naprosyn, Naproxen, Aleve, Aspirin    __X__ Stop all herbal supplements, fish oil or vitamin E until after surgery.    ____ Bring C-Pap to the hospital.

## 2019-12-20 LAB — HEMOGLOBIN A1C
Hgb A1c MFr Bld: 5.3 % (ref 4.8–5.6)
Mean Plasma Glucose: 105.41 mg/dL

## 2019-12-20 LAB — SARS CORONAVIRUS 2 (TAT 6-24 HRS): SARS Coronavirus 2: NEGATIVE

## 2019-12-23 ENCOUNTER — Observation Stay
Admission: RE | Admit: 2019-12-23 | Discharge: 2019-12-24 | Disposition: A | Discharge status: Home / Self Care | Payer: Medicare Other | Source: Ambulatory Visit | Attending: Orthopedic Surgery | Admitting: Orthopedic Surgery

## 2019-12-23 ENCOUNTER — Inpatient Hospital Stay: Payer: Medicare Other | Admitting: Anesthesiology

## 2019-12-23 ENCOUNTER — Other Ambulatory Visit: Payer: Self-pay

## 2019-12-23 ENCOUNTER — Inpatient Hospital Stay: Payer: Medicare Other

## 2019-12-23 ENCOUNTER — Encounter
Admission: RE | Disposition: A | Discharge status: Home / Self Care | Payer: Self-pay | Source: Ambulatory Visit | Attending: Orthopedic Surgery

## 2019-12-23 ENCOUNTER — Encounter: Payer: Self-pay | Admitting: Orthopedic Surgery

## 2019-12-23 DIAGNOSIS — Z96651 Presence of right artificial knee joint: Secondary | ICD-10-CM | POA: Diagnosis not present

## 2019-12-23 DIAGNOSIS — Z79899 Other long term (current) drug therapy: Secondary | ICD-10-CM | POA: Insufficient documentation

## 2019-12-23 DIAGNOSIS — I10 Essential (primary) hypertension: Secondary | ICD-10-CM | POA: Insufficient documentation

## 2019-12-23 DIAGNOSIS — Z96652 Presence of left artificial knee joint: Secondary | ICD-10-CM

## 2019-12-23 DIAGNOSIS — E039 Hypothyroidism, unspecified: Secondary | ICD-10-CM | POA: Insufficient documentation

## 2019-12-23 DIAGNOSIS — M1712 Unilateral primary osteoarthritis, left knee: Principal | ICD-10-CM | POA: Insufficient documentation

## 2019-12-23 DIAGNOSIS — M25562 Pain in left knee: Secondary | ICD-10-CM | POA: Diagnosis present

## 2019-12-23 HISTORY — PX: TOTAL KNEE ARTHROPLASTY: SHX125

## 2019-12-23 SURGERY — ARTHROPLASTY, KNEE, TOTAL
Anesthesia: Spinal | Site: Knee | Laterality: Left

## 2019-12-23 MED ORDER — CHLORHEXIDINE GLUCONATE 0.12 % MT SOLN: 15.0000 mL | Freq: Once | OROMUCOSAL | Status: AC

## 2019-12-23 MED ORDER — FENTANYL CITRATE (PF) 100 MCG/2ML IJ SOLN
INTRAMUSCULAR | Status: AC
  Administered 2019-12-23: 25 ug via INTRAVENOUS
  Filled 2019-12-23: qty 2

## 2019-12-23 MED ORDER — KETOROLAC TROMETHAMINE 30 MG/ML IJ SOLN
INTRAMUSCULAR | Status: DC | PRN
  Administered 2019-12-23: 15 mg via INTRAVENOUS

## 2019-12-23 MED ORDER — LISINOPRIL-HYDROCHLOROTHIAZIDE 20-25 MG PO TABS: 1.0000 | ORAL_TABLET | Freq: Every day | ORAL | Status: DC

## 2019-12-23 MED ORDER — LIDOCAINE HCL (PF) 2 % IJ SOLN
INTRAMUSCULAR | Status: AC
  Filled 2019-12-23: qty 5

## 2019-12-23 MED ORDER — LISINOPRIL 20 MG PO TABS
20.0000 mg | ORAL_TABLET | Freq: Every day | ORAL | Status: DC
  Administered 2019-12-24: 20 mg via ORAL
  Filled 2019-12-23: qty 1

## 2019-12-23 MED ORDER — CEFAZOLIN SODIUM-DEXTROSE 2-4 GM/100ML-% IV SOLN
2.0000 g | INTRAVENOUS | Status: AC
  Administered 2019-12-23: 2 g via INTRAVENOUS

## 2019-12-23 MED ORDER — OXYCODONE HCL 5 MG PO TABS: 5.0000 mg | ORAL_TABLET | ORAL | Status: DC | PRN

## 2019-12-23 MED ORDER — CLINDAMYCIN PHOSPHATE 600 MG/50ML IV SOLN
INTRAVENOUS | Status: AC
  Filled 2019-12-23: qty 50

## 2019-12-23 MED ORDER — OXYCODONE HCL 5 MG PO TABS
ORAL_TABLET | ORAL | Status: AC
  Filled 2019-12-23: qty 1

## 2019-12-23 MED ORDER — PHENYLEPHRINE HCL (PRESSORS) 10 MG/ML IV SOLN
INTRAVENOUS | Status: DC | PRN
  Administered 2019-12-23: 50 ug via INTRAVENOUS
  Administered 2019-12-23: 100 ug via INTRAVENOUS

## 2019-12-23 MED ORDER — ACETAMINOPHEN 10 MG/ML IV SOLN
INTRAVENOUS | Status: DC | PRN
  Administered 2019-12-23: 1000 mg via INTRAVENOUS

## 2019-12-23 MED ORDER — ACETAMINOPHEN 10 MG/ML IV SOLN
INTRAVENOUS | Status: AC
  Filled 2019-12-23: qty 100

## 2019-12-23 MED ORDER — HYDROMORPHONE HCL 1 MG/ML IJ SOLN
INTRAMUSCULAR | Status: AC
  Filled 2019-12-23: qty 1

## 2019-12-23 MED ORDER — LACTATED RINGERS IV SOLN: INTRAVENOUS | Status: DC

## 2019-12-23 MED ORDER — GLYCOPYRROLATE 0.2 MG/ML IJ SOLN
INTRAMUSCULAR | Status: AC
  Filled 2019-12-23: qty 1

## 2019-12-23 MED ORDER — HYDROMORPHONE HCL 1 MG/ML IJ SOLN
0.5000 mg | INTRAMUSCULAR | Status: AC | PRN
  Administered 2019-12-23: 0.5 mg via INTRAVENOUS

## 2019-12-23 MED ORDER — FLUTICASONE PROPIONATE 50 MCG/ACT NA SUSP
1.0000 | Freq: Every day | NASAL | Status: DC | PRN
  Filled 2019-12-23: qty 16

## 2019-12-23 MED ORDER — HYDROCHLOROTHIAZIDE 25 MG PO TABS
25.0000 mg | ORAL_TABLET | Freq: Every day | ORAL | Status: DC
  Administered 2019-12-24: 25 mg via ORAL
  Filled 2019-12-23: qty 1

## 2019-12-23 MED ORDER — MIDAZOLAM HCL 2 MG/2ML IJ SOLN
INTRAMUSCULAR | Status: AC
  Filled 2019-12-23: qty 2

## 2019-12-23 MED ORDER — SODIUM CHLORIDE 0.9 % IV SOLN
INTRAVENOUS | Status: DC | PRN
  Administered 2019-12-23: 60 mL

## 2019-12-23 MED ORDER — LORATADINE 10 MG PO TABS
10.0000 mg | ORAL_TABLET | Freq: Every day | ORAL | Status: DC
  Administered 2019-12-24: 10 mg via ORAL
  Filled 2019-12-23: qty 1

## 2019-12-23 MED ORDER — ROSUVASTATIN CALCIUM 5 MG PO TABS
5.0000 mg | ORAL_TABLET | Freq: Every day | ORAL | Status: DC
  Administered 2019-12-24: 5 mg via ORAL
  Filled 2019-12-23: qty 1

## 2019-12-23 MED ORDER — CHLORHEXIDINE GLUCONATE CLOTH 2 % EX PADS: 6.0000 | MEDICATED_PAD | Freq: Once | CUTANEOUS | Status: DC

## 2019-12-23 MED ORDER — PROPOFOL 500 MG/50ML IV EMUL
INTRAVENOUS | Status: DC | PRN
  Administered 2019-12-23: 100 ug/kg/min via INTRAVENOUS

## 2019-12-23 MED ORDER — ENOXAPARIN SODIUM 40 MG/0.4ML ~~LOC~~ SOLN
40.0000 mg | SUBCUTANEOUS | Status: DC
  Administered 2019-12-24: 40 mg via SUBCUTANEOUS
  Filled 2019-12-23: qty 0.4

## 2019-12-23 MED ORDER — SODIUM CHLORIDE 0.9 % IV SOLN: INTRAVENOUS | Status: DC

## 2019-12-23 MED ORDER — PHENYLEPHRINE HCL (PRESSORS) 10 MG/ML IV SOLN
INTRAVENOUS | Status: AC
  Filled 2019-12-23: qty 1

## 2019-12-23 MED ORDER — METHOCARBAMOL 500 MG PO TABS: 500.0000 mg | ORAL_TABLET | Freq: Four times a day (QID) | ORAL | Status: DC | PRN

## 2019-12-23 MED ORDER — BUPIVACAINE HCL (PF) 0.5 % IJ SOLN
INTRAMUSCULAR | Status: AC
  Filled 2019-12-23: qty 10

## 2019-12-23 MED ORDER — MAGNESIUM CITRATE PO SOLN
1.0000 | Freq: Once | ORAL | Status: DC | PRN
  Filled 2019-12-23: qty 296

## 2019-12-23 MED ORDER — PROPOFOL 500 MG/50ML IV EMUL
INTRAVENOUS | Status: AC
  Filled 2019-12-23: qty 50

## 2019-12-23 MED ORDER — TRANEXAMIC ACID-NACL 1000-0.7 MG/100ML-% IV SOLN
1000.0000 mg | INTRAVENOUS | Status: AC
  Administered 2019-12-23: 1000 mg via INTRAVENOUS

## 2019-12-23 MED ORDER — BUPIVACAINE-EPINEPHRINE 0.25% -1:200000 IJ SOLN
INTRAMUSCULAR | Status: DC | PRN
  Administered 2019-12-23: 30 mL

## 2019-12-23 MED ORDER — SENNOSIDES-DOCUSATE SODIUM 8.6-50 MG PO TABS
1.0000 | ORAL_TABLET | Freq: Every evening | ORAL | Status: DC | PRN
  Filled 2019-12-23: qty 1

## 2019-12-23 MED ORDER — BUPIVACAINE HCL (PF) 0.5 % IJ SOLN
INTRAMUSCULAR | Status: DC | PRN
  Administered 2019-12-23: 2.8 mL

## 2019-12-23 MED ORDER — KETOROLAC TROMETHAMINE 15 MG/ML IJ SOLN
INTRAMUSCULAR | Status: AC
  Filled 2019-12-23: qty 1

## 2019-12-23 MED ORDER — CHLORHEXIDINE GLUCONATE 0.12 % MT SOLN
OROMUCOSAL | Status: AC
  Administered 2019-12-23: 15 mL via OROMUCOSAL
  Filled 2019-12-23: qty 15

## 2019-12-23 MED ORDER — LIDOCAINE HCL (CARDIAC) PF 100 MG/5ML IV SOSY
PREFILLED_SYRINGE | INTRAVENOUS | Status: DC | PRN
  Administered 2019-12-23: 50 mg via INTRAVENOUS

## 2019-12-23 MED ORDER — NALOXONE HCL 2 MG/2ML IJ SOSY
0.4000 mg | PREFILLED_SYRINGE | INTRAMUSCULAR | Status: DC | PRN
  Filled 2019-12-23: qty 2

## 2019-12-23 MED ORDER — KETOROLAC TROMETHAMINE 30 MG/ML IJ SOLN
INTRAMUSCULAR | Status: AC
  Filled 2019-12-23: qty 1

## 2019-12-23 MED ORDER — LEVOTHYROXINE SODIUM 88 MCG PO TABS
88.0000 ug | ORAL_TABLET | Freq: Every day | ORAL | Status: DC
  Administered 2019-12-24: 88 ug via ORAL
  Filled 2019-12-23 (×2): qty 1

## 2019-12-23 MED ORDER — CEFAZOLIN SODIUM-DEXTROSE 2-4 GM/100ML-% IV SOLN
INTRAVENOUS | Status: AC
  Filled 2019-12-23: qty 100

## 2019-12-23 MED ORDER — DIPHENHYDRAMINE HCL 12.5 MG/5ML PO ELIX
12.5000 mg | ORAL_SOLUTION | ORAL | Status: DC | PRN
  Filled 2019-12-23: qty 10

## 2019-12-23 MED ORDER — OXYCODONE HCL 5 MG PO TABS
5.0000 mg | ORAL_TABLET | ORAL | Status: DC | PRN
  Administered 2019-12-23: 10 mg via ORAL
  Filled 2019-12-23: qty 2

## 2019-12-23 MED ORDER — ONDANSETRON HCL 4 MG PO TABS: 4.0000 mg | ORAL_TABLET | Freq: Four times a day (QID) | ORAL | Status: DC | PRN

## 2019-12-23 MED ORDER — ORAL CARE MOUTH RINSE: 15.0000 mL | Freq: Once | OROMUCOSAL | Status: AC

## 2019-12-23 MED ORDER — ONDANSETRON HCL 4 MG/2ML IJ SOLN
4.0000 mg | Freq: Four times a day (QID) | INTRAMUSCULAR | Status: DC | PRN
  Administered 2019-12-23: 4 mg via INTRAVENOUS
  Filled 2019-12-23: qty 2

## 2019-12-23 MED ORDER — OXYCODONE HCL 5 MG PO TABS
10.0000 mg | ORAL_TABLET | ORAL | Status: DC | PRN
  Administered 2019-12-23 – 2019-12-24 (×2): 15 mg via ORAL
  Administered 2019-12-24: 10 mg via ORAL
  Filled 2019-12-23: qty 3
  Filled 2019-12-23: qty 2

## 2019-12-23 MED ORDER — DOCUSATE SODIUM 100 MG PO CAPS
100.0000 mg | ORAL_CAPSULE | Freq: Two times a day (BID) | ORAL | Status: DC
  Administered 2019-12-23 – 2019-12-24 (×2): 100 mg via ORAL
  Filled 2019-12-23 (×2): qty 1

## 2019-12-23 MED ORDER — CLINDAMYCIN PHOSPHATE 600 MG/50ML IV SOLN
600.0000 mg | Freq: Once | INTRAVENOUS | Status: AC
  Administered 2019-12-23: 600 mg via INTRAVENOUS

## 2019-12-23 MED ORDER — CELECOXIB 200 MG PO CAPS
200.0000 mg | ORAL_CAPSULE | Freq: Every day | ORAL | Status: DC
  Administered 2019-12-23 – 2019-12-24 (×2): 200 mg via ORAL
  Filled 2019-12-23 (×2): qty 1

## 2019-12-23 MED ORDER — ACETAMINOPHEN 325 MG PO TABS: 325.0000 mg | ORAL_TABLET | Freq: Four times a day (QID) | ORAL | Status: DC | PRN

## 2019-12-23 MED ORDER — ACETAMINOPHEN 500 MG PO TABS
1000.0000 mg | ORAL_TABLET | Freq: Four times a day (QID) | ORAL | Status: AC
  Administered 2019-12-23 – 2019-12-24 (×4): 1000 mg via ORAL
  Filled 2019-12-23 (×4): qty 2

## 2019-12-23 MED ORDER — MIDAZOLAM HCL 5 MG/5ML IJ SOLN
INTRAMUSCULAR | Status: DC | PRN
  Administered 2019-12-23 (×2): 1 mg via INTRAVENOUS

## 2019-12-23 MED ORDER — OXYCODONE HCL 5 MG PO TABS
ORAL_TABLET | ORAL | Status: AC
  Filled 2019-12-23: qty 3

## 2019-12-23 MED ORDER — PROPOFOL 10 MG/ML IV BOLUS
INTRAVENOUS | Status: AC
  Filled 2019-12-23: qty 20

## 2019-12-23 MED ORDER — MORPHINE SULFATE 4 MG/ML IJ SOLN
INTRAMUSCULAR | Status: DC | PRN
  Administered 2019-12-23: 2 mg via INTRAVENOUS

## 2019-12-23 MED ORDER — SODIUM CHLORIDE 0.9 % IV SOLN
INTRAVENOUS | Status: DC | PRN
  Administered 2019-12-23: 25 ug/min via INTRAVENOUS

## 2019-12-23 MED ORDER — METHOCARBAMOL 1000 MG/10ML IJ SOLN
500.0000 mg | Freq: Four times a day (QID) | INTRAVENOUS | Status: DC | PRN
  Administered 2019-12-23: 500 mg via INTRAVENOUS
  Filled 2019-12-23 (×2): qty 5

## 2019-12-23 MED ORDER — HYDROMORPHONE HCL 1 MG/ML IJ SOLN
0.5000 mg | INTRAMUSCULAR | Status: DC | PRN
  Administered 2019-12-23: 1 mg via INTRAVENOUS

## 2019-12-23 MED ORDER — FAMOTIDINE 20 MG PO TABS: 20.0000 mg | ORAL_TABLET | Freq: Once | ORAL | Status: AC

## 2019-12-23 MED ORDER — GABAPENTIN 300 MG PO CAPS
300.0000 mg | ORAL_CAPSULE | Freq: Three times a day (TID) | ORAL | Status: DC
  Administered 2019-12-23 – 2019-12-24 (×4): 300 mg via ORAL
  Filled 2019-12-23 (×4): qty 1

## 2019-12-23 MED ORDER — ONDANSETRON HCL 4 MG/2ML IJ SOLN: 4.0000 mg | Freq: Once | INTRAMUSCULAR | Status: DC | PRN

## 2019-12-23 MED ORDER — ESTROGENS, CONJUGATED 0.625 MG/GM VA CREA
1.0000 | TOPICAL_CREAM | VAGINAL | Status: DC
  Filled 2019-12-23: qty 30

## 2019-12-23 MED ORDER — OXYCODONE HCL 5 MG PO TABS
10.0000 mg | ORAL_TABLET | ORAL | Status: DC | PRN
  Administered 2019-12-23: 15 mg via ORAL

## 2019-12-23 MED ORDER — FENTANYL CITRATE (PF) 100 MCG/2ML IJ SOLN
25.0000 ug | INTRAMUSCULAR | Status: DC | PRN
  Administered 2019-12-23 (×3): 25 ug via INTRAVENOUS

## 2019-12-23 MED ORDER — KETOROLAC TROMETHAMINE 15 MG/ML IJ SOLN
15.0000 mg | Freq: Four times a day (QID) | INTRAMUSCULAR | Status: AC
  Administered 2019-12-23 – 2019-12-24 (×4): 15 mg via INTRAVENOUS
  Filled 2019-12-23 (×4): qty 1

## 2019-12-23 MED ORDER — FAMOTIDINE 20 MG PO TABS
ORAL_TABLET | ORAL | Status: AC
  Administered 2019-12-23: 20 mg via ORAL
  Filled 2019-12-23: qty 1

## 2019-12-23 MED ORDER — PROPOFOL 10 MG/ML IV BOLUS
INTRAVENOUS | Status: DC | PRN
  Administered 2019-12-23: 30 mg via INTRAVENOUS

## 2019-12-23 MED ORDER — NEOMYCIN-POLYMYXIN B GU 40-200000 IR SOLN
Status: DC | PRN
  Administered 2019-12-23: 12 mL

## 2019-12-23 MED ORDER — HYDROMORPHONE HCL 1 MG/ML IJ SOLN
INTRAMUSCULAR | Status: AC
  Administered 2019-12-23: 0.5 mg via INTRAVENOUS
  Filled 2019-12-23: qty 1

## 2019-12-23 MED ORDER — BISACODYL 5 MG PO TBEC
10.0000 mg | DELAYED_RELEASE_TABLET | Freq: Every day | ORAL | Status: DC | PRN
  Filled 2019-12-23: qty 2

## 2019-12-23 MED ORDER — TRANEXAMIC ACID-NACL 1000-0.7 MG/100ML-% IV SOLN
INTRAVENOUS | Status: AC
  Filled 2019-12-23: qty 100

## 2019-12-23 SURGICAL SUPPLY — 70 items
BLADE SAW 90X13X1.19 OSCILLAT (BLADE) ×2 IMPLANT
BLADE SAW 90X25X1.19 OSCILLAT (BLADE) ×2 IMPLANT
CANISTER SUCT 3000ML PPV (MISCELLANEOUS) ×2 IMPLANT
CEMENT HV SMART SET (Cement) ×4 IMPLANT
CEMENT TIBIA MBT SIZE 2.5 (Knees) ×1 IMPLANT
CNTNR SPEC 2.5X3XGRAD LEK (MISCELLANEOUS) ×1
COMP FEM CEM PS LT SZ 2 (Knees) ×2 IMPLANT
COMPONENT FEM CEM PS LT SZ 2 (Knees) ×1 IMPLANT
CONT SPEC 4OZ STER OR WHT (MISCELLANEOUS) ×1
CONTAINER SPEC 2.5X3XGRAD LEK (MISCELLANEOUS) ×1 IMPLANT
COOLER POLAR GLACIER W/PUMP (MISCELLANEOUS) IMPLANT
COVER WAND RF STERILE (DRAPES) ×2 IMPLANT
CUFF TOURN SGL QUICK 24 (TOURNIQUET CUFF) ×1
CUFF TOURN SGL QUICK 30 (TOURNIQUET CUFF)
CUFF TRNQT CYL 24X4X16.5-23 (TOURNIQUET CUFF) ×1 IMPLANT
CUFF TRNQT CYL 30X4X21-28X (TOURNIQUET CUFF) IMPLANT
DRAPE 3/4 80X56 (DRAPES) ×4 IMPLANT
DRAPE IMP U-DRAPE 54X76 (DRAPES) ×4 IMPLANT
DRAPE INCISE IOBAN 66X60 STRL (DRAPES) ×2 IMPLANT
DRAPE SURG 17X11 SM STRL (DRAPES) ×4 IMPLANT
DRSG AQUACEL AG ADV 3.5X10 (GAUZE/BANDAGES/DRESSINGS) ×2 IMPLANT
DRSG OPSITE POSTOP 4X12 (GAUZE/BANDAGES/DRESSINGS) IMPLANT
DRSG OPSITE POSTOP 4X14 (GAUZE/BANDAGES/DRESSINGS) IMPLANT
DURAPREP 26ML APPLICATOR (WOUND CARE) ×6 IMPLANT
ELECT REM PT RETURN 9FT ADLT (ELECTROSURGICAL) ×2
ELECTRODE REM PT RTRN 9FT ADLT (ELECTROSURGICAL) ×1 IMPLANT
GAUZE SPONGE 4X4 12PLY STRL (GAUZE/BANDAGES/DRESSINGS) ×2 IMPLANT
GLOVE BIOGEL M STRL SZ7.5 (GLOVE) ×2 IMPLANT
GLOVE BIOGEL PI IND STRL 8 (GLOVE) ×1 IMPLANT
GLOVE BIOGEL PI IND STRL 9 (GLOVE) ×1 IMPLANT
GLOVE BIOGEL PI INDICATOR 8 (GLOVE) ×1
GLOVE BIOGEL PI INDICATOR 9 (GLOVE) ×1
GLOVE SURG 9.0 ORTHO LTXF (GLOVE) ×4 IMPLANT
GOWN STRL REUS TWL 2XL XL LVL4 (GOWN DISPOSABLE) ×2 IMPLANT
GOWN STRL REUS W/ TWL LRG LVL3 (GOWN DISPOSABLE) ×1 IMPLANT
GOWN STRL REUS W/ TWL LRG LVL4 (GOWN DISPOSABLE) ×1 IMPLANT
GOWN STRL REUS W/TWL LRG LVL3 (GOWN DISPOSABLE) ×1
GOWN STRL REUS W/TWL LRG LVL4 (GOWN DISPOSABLE) ×1
HOLDER FOLEY CATH W/STRAP (MISCELLANEOUS) ×2 IMPLANT
IMMBOLIZER KNEE 19 BLUE UNIV (SOFTGOODS) ×2 IMPLANT
INSERT PFC SIG STB SZ 2 12.5MM (Knees) ×2 IMPLANT
KIT TURNOVER KIT A (KITS) ×2 IMPLANT
MANIFOLD NEPTUNE II (INSTRUMENTS) ×2 IMPLANT
NDL SAFETY ECLIPSE 18X1.5 (NEEDLE) ×1 IMPLANT
NEEDLE HYPO 18GX1.5 SHARP (NEEDLE) ×1
NEEDLE HYPO 22GX1.5 SAFETY (NEEDLE) ×2 IMPLANT
NEEDLE SPNL 20GX3.5 QUINCKE YW (NEEDLE) ×2 IMPLANT
NS IRRIG 1000ML POUR BTL (IV SOLUTION) ×2 IMPLANT
PACK TOTAL KNEE (MISCELLANEOUS) ×2 IMPLANT
PAD WRAPON POLAR KNEE (MISCELLANEOUS) ×1 IMPLANT
PATELLA DOME PFC 35MM (Knees) ×2 IMPLANT
PENCIL SMOKE EVACUATOR COATED (MISCELLANEOUS) ×2 IMPLANT
PULSAVAC PLUS IRRIG FAN TIP (DISPOSABLE) ×2
SOL .9 NS 3000ML IRR  AL (IV SOLUTION) ×1
SOL .9 NS 3000ML IRR UROMATIC (IV SOLUTION) ×1 IMPLANT
SPONGE LAP 18X18 RF (DISPOSABLE) IMPLANT
STAPLER SKIN PROX 35W (STAPLE) ×2 IMPLANT
SUCTION FRAZIER HANDLE 10FR (MISCELLANEOUS) ×1
SUCTION TUBE FRAZIER 10FR DISP (MISCELLANEOUS) ×1 IMPLANT
SUT ETHIBOND NAB CT1 #1 30IN (SUTURE) ×4 IMPLANT
SUT VIC AB 0 CT1 36 (SUTURE) ×2 IMPLANT
SUT VIC AB 2-0 CT1 (SUTURE) ×4 IMPLANT
SYR 20ML LL LF (SYRINGE) ×2 IMPLANT
SYR 30ML LL (SYRINGE) ×4 IMPLANT
TIBIA MBT CEMENT SIZE 2.5 (Knees) ×2 IMPLANT
TIP FAN IRRIG PULSAVAC PLUS (DISPOSABLE) ×1 IMPLANT
TOWER CARTRIDGE SMART MIX (DISPOSABLE) ×2 IMPLANT
TRAY FOLEY MTR SLVR 16FR STAT (SET/KITS/TRAYS/PACK) ×2 IMPLANT
TUBE SUCT KAM VAC (TUBING) ×2 IMPLANT
WRAPON POLAR PAD KNEE (MISCELLANEOUS) ×2

## 2019-12-23 NOTE — Transfer of Care (Signed)
Immediate Anesthesia Transfer of Care Note  Patient: Jordan Palmer  Procedure(s) Performed: Left TOTAL KNEE ARTHROPLASTY (Left Knee)  Patient Location: PACU  Anesthesia Type:Spinal  Level of Consciousness: drowsy and patient cooperative  Airway & Oxygen Therapy: Patient Spontanous Breathing  Post-op Assessment: Report given to RN and Post -op Vital signs reviewed and stable  Post vital signs: Reviewed and stable  Last Vitals:  Vitals Value Taken Time  BP 117/64 12/23/19 1114  Temp 36 C 12/23/19 1111  Pulse 61 12/23/19 1115  Resp 12 12/23/19 1115  SpO2 100 % 12/23/19 1115  Vitals shown include unvalidated device data.  Last Pain:  Vitals:   12/23/19 1111  TempSrc:   PainSc: 0-No pain      Patients Stated Pain Goal: 3 (34/74/25 9563)  Complications: No complications documented.

## 2019-12-23 NOTE — Progress Notes (Signed)
Discussed pain with dr Ronelle Nigh.  Has already ordered robaxin.  Per dr Ronelle Nigh given the robaxin and VORB received for dilaudid.  Pt remains oriented.  Pharmacy aware of need for robaxin.

## 2019-12-23 NOTE — OR Nursing (Signed)
Patient still states pain is a 9 out of 10, [place] and Dr. Mack Guise aware.  New orders and he okayed patient to go to the floor.  Patient has been resting with eyes closed. She is tolerating drink and crackers well.

## 2019-12-23 NOTE — H&P (Signed)
PREOPERATIVE H&P  Chief Complaint: Left knee osteoarthritis  HPI: Jordan Palmer is a 69 y.o. female who presents for preoperative history and physical with a diagnosis of Left knee osteoarthritis. Symptoms of pain, swelling and limited range of motion are significantly impairing activities of daily living.  Patient has failed nonoperative management.  Her x-rays demonstrate tricompartmental arthritis with joint space narrowing, subchondral sclerosis and osteophyte formation.  She has successfully undergone a right total knee arthroplasty by me in the past.  She has agreed with surgical management.   Past Medical History:  Diagnosis Date  . Arthritis   . Hyperlipemia   . Hypertension   . Hypothyroidism   . Sleep apnea   . Vertigo    Past Surgical History:  Procedure Laterality Date  . ANTERIOR AND POSTERIOR REPAIR N/A 03/12/2017   Procedure: ANTERIOR (CYSTOCELE) AND POSTERIOR REPAIR (RECTOCELE);  Surgeon: Salvadore Dom, MD;  Location: [facility];  Service: Gynecology;  Laterality: N/A;  . CARPAL TUNNEL RELEASE Bilateral   . CHOLECYSTECTOMY    . CYSTOSCOPY N/A 03/12/2017   Procedure: CYSTOSCOPY;  Surgeon: Salvadore Dom, MD;  [facility]  Service: Gynecology;  Laterality: N/A;  . EYE SURGERY    . KNEE ARTHROSCOPY Left   . KNEE ARTHROSCOPY WITH MEDIAL MENISECTOMY Right 02/13/2019   Procedure: KNEE ARTHROSCOPY WITH MEDIAL MENISECTOMY;  Surgeon: Thornton Park, MD;  Location: [facility];  Service: Orthopedics;  Laterality: Right;  . LACRIMAL DUCT PROBING W/ DACRYOPLASTY Bilateral   . SALPINGOOPHORECTOMY Left 03/12/2017   Procedure: SALPINGECTOMY;  Surgeon: Salvadore Dom, MD;  Location[facility];  Service: Gynecology;  Laterality: Left;  . SHOULDER ARTHROSCOPY WITH OPEN ROTATOR CUFF REPAIR Right 09/03/2014   Procedure: SHOULDER ARTHROSCOPY WITH OPEN ROTATOR CUFF REPAIR;  Surgeon: Thornton Park, MD;  Location: [facility];  Service: Orthopedics;  Laterality: Right;  . THYROIDECTOMY Right    hemi-thyroidectomy  . TOTAL KNEE ARTHROPLASTY Right 05/20/2019   Procedure: TOTAL KNEE ARTHROPLASTY;  Surgeon: Thornton Park, MD;  Location: [facility];  Service: Orthopedics;  Laterality: Right;  Marland Kitchen VAGINAL HYSTERECTOMY N/A 03/12/2017   Procedure: HYSTERECTOMY VAGINAL;  Surgeon: Salvadore Dom, MD;  Location: [facility];  Service: Gynecology;  Laterality: N/A;   Social History   Socioeconomic History  . Marital status: Married    Spouse name: Not on file  . Number of children: Not on file  . Years of education: Not on file  . Highest education level: Not on file  Occupational History  . Not on file  Tobacco Use  . Smoking status: Never Smoker  . Smokeless tobacco: Never Used  Vaping Use  . Vaping Use: Never used  Substance and Sexual Activity  . Alcohol use: Not Currently    Alcohol/week: 3.0 - 4.0 standard drinks    Types: 3 - 4 Standard drinks or equivalent per week  . Drug use: No  . Sexual activity: Yes    Partners: Male    Birth control/protection: Post-menopausal, Surgical  Other Topics Concern  . Not on file  Social History Narrative  . Not on file   Social Determinants of Health   Financial Resource Strain:   . Difficulty of Paying Living Expenses: Not on file  Food Insecurity:   . Worried About Charity fundraiser in the Last Year: Not on file  . Ran Out of Food in the Last Year: Not on file  Transportation Needs:   . Lack  of Transportation (Medical): Not on file  . Lack of Transportation (Non-Medical): Not on file  Physical Activity:   . Days of Exercise per Week: Not on file  . Minutes of Exercise per Session: Not on file  Stress:   . Feeling of Stress : Not on file  Social Connections:   . Frequency of Communication with Friends and Family: Not on file  . Frequency of Social Gatherings with Friends and Family: Not on file  . Attends Religious Services: Not  on file  . Active Member of Clubs or Organizations: Not on file  . Attends Archivist Meetings: Not on file  . Marital Status: Not on file   Family History  Problem Relation Age of Onset  . Diabetes type II Father   . CAD Father   . Alzheimer's disease Father   . Alzheimer's disease Mother   . Breast cancer Neg Hx    Allergies  Allergen Reactions  . Codeine Nausea And Vomiting  . Doxycycline Photosensitivity   Prior to Admission medications   Medication Sig Start Date End Date Taking? Authorizing Provider  acetaminophen (TYLENOL) 500 MG tablet Take 1,000 mg by mouth 2 (two) times daily as needed for moderate pain.    Yes [provider]  celecoxib (CELEBREX) 200 MG capsule Take 200 mg by mouth daily. 11/26/19  Yes [provider]  conjugated estrogens (PREMARIN) vaginal cream 1/2 gram vaginally twice weekly Patient taking differently: Place 1 Applicatorful vaginally 2 (two) times a week.  01/13/19  Yes Salvadore Dom, MD  fexofenadine (ALLEGRA) 180 MG tablet Take 180 mg by mouth daily as needed for allergies.    Yes [provider]  fluticasone (FLONASE) 50 MCG/ACT nasal spray Place 1 spray into both nostrils daily as needed for allergies.    Yes [provider]  levothyroxine (SYNTHROID) 88 MCG tablet Take 88 mcg by mouth daily before breakfast.   Yes [provider]  lisinopril-hydrochlorothiazide (PRINZIDE,ZESTORETIC) 20-25 MG per tablet Take 1 tablet by mouth daily.   Yes [provider]  rosuvastatin (CRESTOR) 5 MG tablet Take 5 mg by mouth daily.  07/01/15  Yes [provider]  Scar Treatment Products (SCAR GEL EX) Apply 1 application topically daily.   Yes [provider]  traMADol (ULTRAM) 50 MG tablet Take 50 mg by mouth 2 (two) times daily as needed for moderate pain.   Yes [provider]     Positive ROS: All other systems have been reviewed and were otherwise negative with the  exception of those mentioned in the HPI and as above.  Physical Exam: General: Alert, no acute distress Cardiovascular: Regular rate and rhythm, no murmurs rubs or gallops.  No pedal edema Respiratory: Clear to auscultation bilaterally, no wheezes rales or rhonchi. No cyanosis, no use of accessory musculature GI: No organomegaly, abdomen is soft and non-tender nondistended with positive bowel sounds. Skin: Skin intact, no lesions within the operative field. Neurologic: Sensation intact distally Psychiatric: Patient is competent for consent with normal mood and affect Lymphatic: No cervical lymphadenopathy  MUSCULOSKELETAL: Left lower extremity: Patient's skin is intact.  There is no erythema ecchymosis or large effusion.  Patient's range of motion is from 0-115 degrees.  Patient has no ligamentous laxity.  Patient has patellofemoral crepitus but no grind apprehension.  She has palpable pedal pulses, intact sensation light touch and intact motor function without weakness.  Assessment: Left knee osteoarthritis  Plan: Plan for Procedure(s): Left TOTAL KNEE ARTHROPLASTY  I  reviewed the details the operation as well as the postoperative course.  The patient is familiar with the surgery having been through it before on her right knee.  Patient's left knee was marked in the preoperative area.  Reviewed the patient's labs in preparation for this case.  I discussed the risks and benefits of surgery. The risks include but are not limited to infection, bleeding requiring blood transfusion, nerve or blood vessel injury, joint stiffness or loss of motion, persistent pain, weakness or instability, fracture, dislocation, loosening or failure of the hardware and the need for further surgery. Medical risks include but are not limited to DVT and pulmonary embolism, myocardial infarction, stroke, pneumonia, respiratory failure and death. Patient understood these risks and wished to proceed.     Thornton Park, MD   12/23/2019 7:52 AM

## 2019-12-23 NOTE — Op Note (Signed)
DATE OF SURGERY:  12/23/2019 TIME: 11:28 AM  PATIENT NAME:  Jordan Palmer   AGE: 69 y.o.    PRE-OPERATIVE DIAGNOSIS:  Left knee osteoarthritis  POST-OPERATIVE DIAGNOSIS:  Same  PROCEDURE:  Procedure(s): Left TOTAL KNEE ARTHROPLASTY  SURGEON:  Thornton Park, MD   ASSISTANT:  Roland Rack, PA  OPERATIVE IMPLANTS: Depuy PFC Sigma, Posterior Stabilized Femural component size 2, Tibia size rotating platform component size 2.5, Patella polyethylene 3-peg oval button size 35, with a 12.5 mm polyethylene insert.  EBL:  25  TOURNIQUET TIME:  120 minutes  PREOPERATIVE INDICATIONS:  Jordan Palmer is an 69 y.o. female who has a diagnosis of  Left knee osteoarthritis and elected for a right total knee arthroplasty after failing nonoperative treatment.  Their knee pain significantly impacts their activity of daily living.  Radiographs have demonstrated tricompartmental osteoarthritis joint space narrowing, osteophytes, subchondral sclerosis with the lateral compartment left significantly involved.  The risks, benefits, and alternatives were discussed at length including but not limited to the risks of infection, bleeding, nerve or blood vessel injury, knee stiffness, fracture, dislocation, loosening or failure of the hardware and the need for further surgery. Medical risks include but not limited to DVT and pulmonary embolism, myocardial infarction, stroke, pneumonia, respiratory failure and death. I discussed these risks with the patient in my office prior to the date of surgery. They understood these risks and were willing to proceed.  OPERATIVE FINDINGS AND UNIQUE ASPECTS OF THE CASE: Tricompartmental osteoarthritis with lateral compartment devoid of cartilage. There were large osteophytes in all 3 compartments.  OPERATIVE DESCRIPTION:  The patient was brought to the operative room and placed in a supine position after undergoing placement of a final anesthetic.  A Foley catheter was  placed.  IV antibiotics were given. Patient received Ancef 2 g and clindamycin 600 mg IV. Patient was also given a dose of tranexamic acid prior to inflation of the tourniquet. The lower extremity was prepped and draped in the usual sterile fashion.  A time out was performed to verify the patient's name, date of birth, medical record number, correct site of surgery and correct procedure to be performed. The timeout was also used to confirm the patient received antibiotics and that appropriate instruments, implants and radiographs studies were available in the room.  The leg was elevated and exsanguinated with an Esmarch and the tourniquet was inflated to 275 mmHg for 120 minutes..  A midline incision was made over the left knee. Full-thickness skin flaps were developed. A medial parapatellar arthrotomy was then made and the patella everted and the knee was brought into 90 of flexion. Hoffa's fat pad along with the cruciate ligaments and medial and lateral menisci were resected.   The distal femoral intramedullary canal was opened with a drill and the intramedullary distal femoral cutting jig was inserted into the femoral canal pinned into position. It was set at 5 degrees resecting 10 mm off the distal femur.  Care was taken to protect the collateral ligaments during distal femoral resection.  The distal femoral resection was performed with an oscillating saw. The femoral cutting guide was then removed.  The extramedullary tibial cutting guide was then placed using the anterior tibial crest and second ray of the foot as a references.  The tibial cutting guide was adjusted to allow for appropriate posterior slope.  The tibial cutting block was pinned into position. The slotted stylus was used to measure the proximal tibial resection of 10 mm off the high  lateral side.  The tibial long rod alignment guide was then used to confirm position of the cutting block. A third cross pin through the tibial cutting block  was then drilled into position to allow for rotational stability. Care was taken during the tibial resection to protect the medial and collateral ligaments.  The resected tibial bone was removed along with the posterior horns of the menisci.  The PCL was sacrificed.  Extension gap was measured with a spacer block and alignment and extension was confirmed using a long alignment rod.  The attention was then turned back to the femur. The posterior referencing distal femoral sizing guide was applied to the distal femur.  The femur was sized to be a size 2. Rotation of the referencing guide was checked with the epicondylar axis and Whitesides line. Then the 4-in-1 cutting jig was then applied to the distal femur. A stylus was used to confirm that the anterior femur would not be notched.   Then the anterior, posterior and chamfer femoral cuts were then made with an oscillating saw.  The flexion gap was then measured with a flexion spacer block and long alignment rod and was found to be symmetric with the extension gap and perpendicular to mechanical axis of the tibia.  The distal femoral preparation was completed by performing the posterior stabilized box cut using the cutting block. The entry site for the intramedullary femoral guide was filled with autologous bone graft from bone previously resected earlier in the case.  The proximal tibia plateau was then sized with trial trays. The best coverage was achieved with a size 2.5. This tibial tray was then pinned into position. The proximal tibia was then prepared with the reamer and keel punch.  After tibial preparation was completed, all trial components were inserted with polyethylene trials.  The knee was found to have excellent balance and full motion with a size 10 mm tibial polyethylene insert..    The attention was then turned to preparation of the patella. The thickness of the patella was measured with a caliper, the diameter measured with the patella  templates.  The patella resection was then made with an oscillating saw using the patella cutting guide.  3 peg holes for the patella component were then drilled. The trial patella was then placed. Knee was taken through a full range of motion and deemed to be stable with the trial components. All trial components were then removed.  The knee capsule was then injected with Exparel.   The joint was copiously irrigated with pulse lavage.  The final total knee arthroplasty components were then cemented into place with a 10 mm trial polyethylene insert and all excess methylmethacrylate was removed.  The joint was again copiously irrigated. After the cement had hardened the knee was again taken through a full range of motion. It was felt to be most stable with the 12.5 mm tibial polyethylene insert. The actual tibial polyethylene insert was then placed.   The knee was taken through a range of motion and the patella tracked well and the knee was again irrigated copiously.  The anterior subcutaneous tissue of the left knee was injected with a mixture of quarter percent Marcaine, Toradol and morphine to assist with postoperative pain relief.   The medial arthrotomy was closed with #1 Ethibond. The subcutaneous tissue closed with 0 and 2-0 vicryl, and skin approximated with staples.  A dry sterile and compressive dressing was applied.  A Polar Care was applied to the operative  knee along with a knee immobilizer.  The patient was awakened and brought to the PACU in stable and satisfactory condition.  All sharp, lap and instrument counts were correct at the conclusion the case. I spoke with the patient's husband by phone from the PACU to let him know the case had been performed without complication and the patient was stable in recovery room.

## 2019-12-23 NOTE — Anesthesia Preprocedure Evaluation (Signed)
Anesthesia Evaluation  Patient identified by MRN, date of birth, ID band Patient awake    Reviewed: Allergy & Precautions, NPO status , Patient's Chart, lab work & pertinent test results  History of Anesthesia Complications Negative for: history of anesthetic complications  Airway Mallampati: III       Dental   Pulmonary sleep apnea and Continuous Positive Airway Pressure Ventilation , COPD,           Cardiovascular hypertension, Pt. on medications (-) Past MI and (-) CHF (-) dysrhythmias (-) Valvular Problems/Murmurs     Neuro/Psych neg Seizures    GI/Hepatic Neg liver ROS, neg GERD  ,  Endo/Other  neg diabetesHypothyroidism   Renal/GU negative Renal ROS     Musculoskeletal   Abdominal   Peds  Hematology   Anesthesia Other Findings   Reproductive/Obstetrics                             Anesthesia Physical Anesthesia Plan  ASA: II  Anesthesia Plan: Spinal   Post-op Pain Management:    Induction:   PONV Risk Score and Plan:   Airway Management Planned: Nasal Cannula  Additional Equipment:   Intra-op Plan:   Post-operative Plan:   Informed Consent: I have reviewed the patients History and Physical, chart, labs and discussed the procedure including the risks, benefits and alternatives for the proposed anesthesia with the patient or authorized representative who has indicated his/her understanding and acceptance.       Plan Discussed with:   Anesthesia Plan Comments:         Anesthesia Quick Evaluation

## 2019-12-24 ENCOUNTER — Encounter: Payer: Self-pay | Admitting: Orthopedic Surgery

## 2019-12-24 DIAGNOSIS — M1712 Unilateral primary osteoarthritis, left knee: Secondary | ICD-10-CM | POA: Diagnosis not present

## 2019-12-24 LAB — CBC
HCT: 29.1 % — ABNORMAL LOW (ref 36.0–46.0)
Hemoglobin: 9.8 g/dL — ABNORMAL LOW (ref 12.0–15.0)
MCH: 32.7 pg (ref 26.0–34.0)
MCHC: 33.7 g/dL (ref 30.0–36.0)
MCV: 97 fL (ref 80.0–100.0)
Platelets: 183 10*3/uL (ref 150–400)
RBC: 3 MIL/uL — ABNORMAL LOW (ref 3.87–5.11)
RDW: 12.8 % (ref 11.5–15.5)
WBC: 5.2 10*3/uL (ref 4.0–10.5)
nRBC: 0 % (ref 0.0–0.2)

## 2019-12-24 LAB — BASIC METABOLIC PANEL
Anion gap: 9 (ref 5–15)
BUN: 22 mg/dL (ref 8–23)
CO2: 24 mmol/L (ref 22–32)
Calcium: 8.6 mg/dL — ABNORMAL LOW (ref 8.9–10.3)
Chloride: 104 mmol/L (ref 98–111)
Creatinine, Ser: 1.01 mg/dL — ABNORMAL HIGH (ref 0.44–1.00)
GFR, Estimated: >60 mL/min (ref >60)
Glucose, Bld: 95 mg/dL (ref 70–99)
Potassium: 3.8 mmol/L (ref 3.5–5.1)
Sodium: 137 mmol/L (ref 135–145)

## 2019-12-24 MED ORDER — GABAPENTIN 300 MG PO CAPS
300.0000 mg | ORAL_CAPSULE | Freq: Three times a day (TID) | ORAL | 0 refills | Status: DC
Start: 2019-12-24 — End: 2020-03-18

## 2019-12-24 MED ORDER — OXYCODONE HCL 5 MG PO TABS: 5.0000 mg | ORAL_TABLET | ORAL | 0 refills | Status: DC | PRN

## 2019-12-24 MED ORDER — DOCUSATE SODIUM 100 MG PO CAPS
100.0000 mg | ORAL_CAPSULE | Freq: Two times a day (BID) | ORAL | 0 refills | Status: DC
Start: 2019-12-24 — End: 2020-04-02

## 2019-12-24 MED ORDER — ENOXAPARIN SODIUM 40 MG/0.4ML ~~LOC~~ SOLN: 40.0000 mg | SUBCUTANEOUS | 0 refills | Status: DC

## 2019-12-24 NOTE — Care Management Obs Status (Signed)
[place] NOTIFICATION   Patient Details  Name: DAINE CROKER MRN: [id number] Date of Birth: [birth year]   Medicare Observation Status Notification Given:  Yes    Shelbie Ammons, RN 12/24/2019, 8:55 AM

## 2019-12-24 NOTE — Evaluation (Signed)
Occupational Therapy Evaluation Patient Details Name: Jordan Palmer MRN: [id number] DOB: 1950-03-09 Today's Date: 12/24/2019    History of Present Illness 69 y/o female s/p L TKA 11/23, had R knee replaced ~7 months prior.   Clinical Impression   Pt seen for OT evaluation this date POD #1 from L TKA. Pt with c/o L knee pain at incision site, but otherwise states she'd doing well. Pt reports being INDEP at baseline, but has RW and BSC from previous R TKA. Pt requires MOD A for LB ADLs, but reports her and her spouse have plans in place for him assisting her with LB ADLs while she recovers. Pt politely declines AE training for LB ADLs. OT facilitates ed re: polar care and compression stocking mgt and pt verbalized good understanding. Handout issued. OT facilitates pt participation in transfer training with pt demo'ing good understanding of hand placement for safe technique. No further acute OT needs detected at this time and do not anticipate pt will require f/u OT outside of acute setting. Will complete order at this time.     Follow Up Recommendations  No OT follow up    Equipment Recommendations  None recommended by OT (pt reports having all necessary equipment.)    Recommendations for Other Services       Precautions / Restrictions Precautions Precautions: Fall Restrictions Weight Bearing Restrictions: Yes LLE Weight Bearing: Weight bearing as tolerated      Mobility Bed Mobility Overal bed mobility: Independent             General bed mobility comments: pt up to chair pre/post session    Transfers Overall transfer level: Modified independent Equipment used: Rolling walker (2 wheeled)             General transfer comment: pt with good control, approach, safe walker use, and stability upon reaching full extended stand.    Balance Overall balance assessment: Modified Independent                                         ADL either performed or  assessed with clinical judgement   ADL                                         General ADL Comments: MOD A with LB ADLs 2/2 KI to L LE. Pt reports that spouse helped her manage LB ADLs with prior knee sx and pt politely declines AE education. Pt able to perform ADL transfers with MOD I With RW and good safety awareness. Completes all other UB ADLs I'ly.     Vision Patient Visual Report: No change from baseline       Perception     Praxis      Pertinent Vitals/Pain Pain Assessment: 0-10 Pain Score: 7  Pain Location: significant pain at incision Pain Descriptors / Indicators: Grimacing;Tender Pain Intervention(s): Monitored during session;Ice applied     Hand Dominance     Extremity/Trunk Assessment Upper Extremity Assessment Upper Extremity Assessment: Overall WFL for tasks assessed   Lower Extremity Assessment Lower Extremity Assessment: Defer to PT evaluation       Communication Communication Communication: No difficulties   Cognition Arousal/Alertness: Awake/alert Behavior During Therapy: WFL for tasks assessed/performed Overall Cognitive Status: Within Functional Limits for tasks assessed  General Comments       Exercises  Other Exercises Other Exercises: OT facilitates ed re: safety with ADLs including pet safety for fall prevention in the home environment. Polar care and compression stocking mgt, pt with good recall from prior knee surgery.   Shoulder Instructions      Home Living Family/patient expects to be discharged to:: Private residence Living Arrangements: Spouse/significant other Available Help at Discharge: Family;Available 24 hours/day (husband off work 2 weeks 24/7) Type of Home: House Home Access: Stairs to enter [employer] of Steps: 3 Entrance Stairs-Rails: Left;Right (wide) Home Layout: One level     Bathroom Shower/Tub: Occupational psychologist:  Handicapped height     Home Equipment: [place] - single point;Tub bench;Walker - 2 wheels;Bedside commode          Prior Functioning/Environment Level of Independence: Independent with assistive device(s)        Comments: 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, stays active, retired OR nurse        OT Problem List: Decreased range of motion;Decreased activity tolerance;Pain      OT Treatment/Interventions: Field seismologist;Therapeutic activities;Patient/family education    OT Goals(Current goals can be found in the care plan section) Acute Rehab OT Goals Patient Stated Goal: go home OT Goal Formulation: All assessment and education complete, DC therapy  OT Frequency:     Barriers to D/C:            Co-evaluation              AM-PAC OT "6 Clicks" Daily Activity     Outcome Measure Help from another person eating meals?: None Help from another person taking care of personal grooming?: None Help from another person toileting, which includes using toliet, bedpan, or urinal?: None Help from another person bathing (including washing, rinsing, drying)?: A Little Help from another person to put on and taking off regular upper body clothing?: None Help from another person to put on and taking off regular lower body clothing?: A Lot 6 Click Score: 21   End of Session Equipment Utilized During Treatment: Gait belt;Rolling walker  Activity Tolerance: Patient tolerated treatment well Patient left: in chair;with call bell/phone within reach  OT Visit Diagnosis: Other abnormalities of gait and mobility (R26.89)                Time: 6144-3154 OT Time Calculation (min): 38 min Charges:  OT General Charges $OT Visit: 1 Visit OT Evaluation $OT Eval Moderate Complexity: 1 Mod OT Treatments $Self Care/Home Management : 8-22 mins $Therapeutic Activity: 8-22 mins  Gerrianne Scale, MS, OTR/L ascom [phone] 12/24/19, 1:46 PM

## 2019-12-24 NOTE — Progress Notes (Signed)
Pt discharged home with husband. Pt acknowledged discharge instructions and showed understanding. Took all belongings and polar care. IV taken out of left hand , no complications. Pt wheeled down to medical mall by volunteer and husband.

## 2019-12-24 NOTE — Care Management CC44 (Signed)
Condition Code 44 Documentation Completed  Patient Details  Name: ZELLIE JENNING MRN: [id number] Date of Birth: 24-Jul-1950   Condition Code 44 given:  Yes Patient signature on Condition Code 44 notice:  Yes Documentation of 2 MD's agreement:  Yes Code 44 added to claim:  Yes    Shelbie Ammons, RN 12/24/2019, 8:55 AM

## 2019-12-24 NOTE — TOC Initial Note (Signed)
Transition of Care [facility]) - Initial/Assessment Note    Patient Details  Name: Jordan Palmer MRN: [id number] Date of Birth: Jul 24, 1950  [facility] of [facility]) CM/SW Contact:    Shelbie Ammons, RN Phone Number: 12/24/2019, 8:57 AM  Clinical Narrative:   RNCM met with patient at bedside, patient is sitting up in recliner having just completed PT. Patient is independent from home and is status post Left knee replacement. Patient reports to pain today but that she just had pain medicine prior to PT session and knows it isn't working yet. Patient reports that she has all necessary equipment at home and that she is already set up with outpatient PT in [place]. RNCM delivered Code 44 to patient as well.              Expected Discharge Plan: Home/Self Care Barriers to Discharge: No Barriers Identified   Patient Goals and CMS Choice        Expected Discharge Plan and Services Expected Discharge Plan: Home/Self Care       Living arrangements for the past 2 months: Single Family Home                                      Prior Living Arrangements/Services Living arrangements for the past 2 months: Single Family Home Lives with:: Spouse Patient language and need for interpreter reviewed:: Yes        Need for Family Participation in Patient Care: Yes (Comment) Care giver support system in place?: Yes (comment)   Criminal Activity/Legal Involvement Pertinent to Current Situation/Hospitalization: No - Comment as needed  Activities of Daily Living Home Assistive Devices/Equipment: Contact lenses, CPAP ADL Screening (condition at time of admission) Patient's cognitive ability adequate to safely complete daily activities?: Yes Is the patient deaf or have difficulty hearing?: No Does the patient have difficulty seeing, even when wearing glasses/contacts?: No Does the patient have difficulty concentrating, remembering, or making decisions?: No Patient able to express need for  assistance with ADLs?: Yes Does the patient have difficulty dressing or bathing?: No Independently performs ADLs?: Yes (appropriate for developmental age) Does the patient have difficulty walking or climbing stairs?: No Weakness of Legs: Both Weakness of Arms/Hands: None  Permission Sought/Granted                  Emotional Assessment Appearance:: Appears younger than stated age Attitude/Demeanor/Rapport: Engaged Affect (typically observed): Appropriate, Calm Orientation: : Oriented to Self, Oriented to Place, Oriented to  Time, Oriented to Situation Alcohol / Substance Use: Not Applicable Psych Involvement: No (comment)  Admission diagnosis:  S/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, left [Z96.652] Patient Active Problem List   Diagnosis Date Noted  . S/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, left 12/23/2019  . S/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, right 05/20/2019  . H/O total vaginal hysterectomy 03/12/2017  . Essential hypertension 03/08/2017  . Mixed dyslipidemia 03/08/2017  . Abnormal EKG 03/08/2017  . Preoperative cardiovascular examination 03/08/2017  . Allergic state 12/11/2013  . High cholesterol 12/11/2013  . History of depression 12/11/2013  . Hypothyroidism 12/11/2013  . Sleep apnea 12/11/2013   PCP:  Baxter Hire, MD Pharmacy:   [id number] - [place], [place] [address] Phone: [phone] Fax: [phone]     Social Determinants of Health (SDOH) Interventions    Readmission Risk Interventions  No flowsheet data found.

## 2019-12-24 NOTE — Discharge Summary (Signed)
Physician Discharge Summary  Patient ID: Jordan Palmer MRN: [id number] DOB/AGE: 07-Jan-1951 69 y.o.  Admit date: 12/23/2019 Discharge date: 12/24/2019  Admission Diagnoses:  Left knee osteoarthritis <principal problem not specified>  Discharge Diagnoses:  Left knee osteoarthritis Active Problems:   S/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, left   Past Medical History:  Diagnosis Date   Arthritis    Hyperlipemia    Hypertension    Hypothyroidism    Sleep apnea    Vertigo     Surgeries: Procedure(s): Left TOTAL KNEE ARTHROPLASTY on 12/23/2019   Consultants (if any):   Discharged Condition: Improved  Hospital Course: YAA DONNELLAN is an 69 y.o. female who was admitted 12/23/2019 with a diagnosis of  Left knee osteoarthritis <principal problem not specified> and went to the operating room on 12/23/2019 and underwent an uncomplicated left total knee arthroplasty.    She was given perioperative antibiotics:  Anti-infectives (From admission, onward)   Start     Dose/Rate Route Frequency Ordered Stop   12/23/19 0628  ceFAZolin (ANCEF) 2-4 GM/100ML-% IVPB       Note to Pharmacy: Norton Blizzard  : cabinet override      12/23/19 0628 12/23/19 0847   12/23/19 0627  clindamycin (CLEOCIN) 600 MG/50ML IVPB       Note to Pharmacy: Norton Blizzard  : cabinet override      12/23/19 0627 12/23/19 0829   12/23/19 0615  clindamycin (CLEOCIN) IVPB 600 mg        600 mg 100 mL/hr over 30 Minutes Intravenous  Once 12/23/19 0603 12/23/19 0829   12/23/19 0615  ceFAZolin (ANCEF) IVPB 2g/100 mL premix        2 g 200 mL/hr over 30 Minutes Intravenous On call to O.R. 12/23/19 3267 12/23/19 0914    .  She was given sequential compression devices, early ambulation, and Lovenox for DVT prophylaxis.  Patient had pain postoperatively which improved overnight.  Patient made good progress with physical therapy and was able to walk to the nurses station and to the physical  therapy office and was able to go up and down stairs.  Given her clinical improvement she is prepared for discharge home postop day #1.  She benefited maximally from the hospital stay and there were no complications.    Recent vital signs:  Vitals:   12/24/19 1231 12/24/19 1707  BP: 130/68 127/62  Pulse: 76 86  Resp: 16 18  Temp: 98.4 F (36.9 C) 98.2 F (36.8 C)  SpO2: 98% 99%    Recent laboratory studies:  Lab Results  Component Value Date   HGB 9.8 (L) 12/24/2019   HGB 10.8 (L) 12/19/2019   HGB 10.1 (L) 05/22/2019   Lab Results  Component Value Date   WBC 5.2 12/24/2019   PLT 183 12/24/2019   Lab Results  Component Value Date   INR 1.0 12/19/2019   Lab Results  Component Value Date   NA 137 12/24/2019   K 3.8 12/24/2019   CL 104 12/24/2019   CO2 24 12/24/2019   BUN 22 12/24/2019   CREATININE 1.01 (H) 12/24/2019   GLUCOSE 95 12/24/2019    Discharge Medications:   Allergies as of 12/24/2019      Reactions   Codeine Nausea And Vomiting   Doxycycline Photosensitivity      Medication List    STOP taking these medications   traMADol 50 MG tablet Commonly known as: ULTRAM     TAKE these medications   acetaminophen  500 MG tablet Commonly known as: TYLENOL Take 1,000 mg by mouth 2 (two) times daily as needed for moderate pain.   celecoxib 200 MG capsule Commonly known as: CELEBREX Take 200 mg by mouth daily.   docusate sodium 100 MG capsule Commonly known as: COLACE Take 1 capsule (100 mg total) by mouth 2 (two) times daily.   enoxaparin 40 MG/0.4ML injection Commonly known as: LOVENOX Inject 0.4 mLs (40 mg total) into the skin daily for 14 days. Start taking on: December 25, 2019   fexofenadine 180 MG tablet Commonly known as: ALLEGRA Take 180 mg by mouth daily as needed for allergies.   fluticasone 50 MCG/ACT nasal spray Commonly known as: FLONASE Place 1 spray into both nostrils daily as needed for allergies.   gabapentin 300 MG  capsule Commonly known as: NEURONTIN Take 1 capsule (300 mg total) by mouth 3 (three) times daily.   levothyroxine 88 MCG tablet Commonly known as: SYNTHROID Take 88 mcg by mouth daily before breakfast.   lisinopril-hydrochlorothiazide 20-25 MG tablet Commonly known as: ZESTORETIC Take 1 tablet by mouth daily.   oxyCODONE 5 MG immediate release tablet Commonly known as: Oxy IR/ROXICODONE Take 1 tablet (5 mg total) by mouth every 4 (four) hours as needed for moderate pain (pain score 4-6).   Premarin vaginal cream Generic drug: conjugated estrogens 1/2 gram vaginally twice weekly What changed:   how much to take  how to take this  when to take this  additional instructions   rosuvastatin 5 MG tablet Commonly known as: CRESTOR Take 5 mg by mouth daily.   SCAR GEL EX Apply 1 application topically daily.       Diagnostic Studies: DG Knee Left Port  Result Date: 12/23/2019 CLINICAL DATA:  Status post total left knee replacement. EXAM: PORTABLE LEFT KNEE - 1-2 VIEW COMPARISON:  MRI 10/08/2007. FINDINGS: Total left knee replacement. Hardware intact. Anatomic alignment. No acute bony abnormality identified. IMPRESSION: Total left knee replacement with anatomic alignment. Electronically Signed   By: Marcello Moores  Register   On: 12/23/2019 11:50    Disposition: Discharge disposition: 01-Home or Self Care       Discharge Instructions    Call MD / Call 911   Complete by: As directed    If you experience chest pain or shortness of breath, CALL 911 and be transported to the hospital emergency room.  If you develope a fever above 101 F, pus (white drainage) or increased drainage or redness at the wound, or calf pain, call your surgeon's office.   Constipation Prevention   Complete by: As directed    Drink plenty of fluids.  Prune juice may be helpful.  You may use a stool softener, such as Colace (over the counter) 100 mg twice a day.  Use MiraLax (over the counter) for  constipation as needed.   Diet general   Complete by: As directed    Discharge instructions   Complete by: As directed    The patient may continue to bear weight on the left lower extremity with use of a walker. The patient should continue to use TED stockings until follow-up. Patient should remove the TED stockings at night for sleep. The patient needs to continue to elevate the left lower extremity whenever possible. The knee immobilizer should be used at night. The patient may remove the knee immobilizer to perform exercises or sit in a chair during the day.  Patient should not place a pillow under their knee. The Polar Care may  be used by the patient for comfort.  The dressing should remain on until follow up in the office.   The patient must cover the left knee dressing/incision during showers with a plastic bag or Saran wrap.  The patient will take lovenox 40 mg injection once a day for blood clot prevention and continue to work on knee range of motion exercises at home as instructed by physical therapy until follow-up in the office.   Driving restrictions   Complete by: As directed    No driving until follow up with Dr. Mack Guise in the office.   Increase activity slowly as tolerated   Complete by: As directed    Lifting restrictions   Complete by: As directed    No lifting for 12-16 weeks         Signed: [place] ,MD 12/24/2019, 5:47 PM

## 2019-12-24 NOTE — Progress Notes (Signed)
Physical Therapy Treatment Patient Details Name: Jordan Palmer MRN: [id number] DOB: [birth year] Today's Date: 12/24/2019    History of Present Illness 69 y/o female s/p L TKA 11/23, had R knee replaced ~7 months prior.    PT Comments    Pt still with a lot of pain (7/10 on arrival) but much better tolerated than AM session, and better timed with pain meds.  She was able to circumambulate the nurses' station and negotiate up/down steps w/o issue (no rest breaks, stable vitals, no LOBs, etc).  She showed good understanding of HEP and safety awareness and generally did quite well. Pt is safe to return home from a PT perspective once cleared by ortho/medicine.   Follow Up Recommendations  Outpatient PT;Follow surgeon's recommendation for DC plan and follow-up therapies     Equipment Recommendations  None recommended by PT    Recommendations for Other Services       Precautions / Restrictions Precautions Precautions: Fall;Knee Precaution Booklet Issued: Yes (comment) Required Braces or Orthoses: Knee Immobilizer - Left Restrictions Weight Bearing Restrictions: Yes LLE Weight Bearing: Weight bearing as tolerated    Mobility  Bed Mobility Overal bed mobility: Independent             General bed mobility comments: pt up to chair pre/post session  Transfers Overall transfer level: Independent Equipment used: Rolling walker (2 wheeled)             General transfer comment: able to rise w/o hesitation or assist, good confidence  Ambulation/Gait Ambulation/Gait assistance: Supervision Gait Distance (Feet): 250 Feet Assistive device: Rolling walker (2 wheeled)       General Gait Details: Pt was able to ambulate with consistent and confident cadence with relatively light reliance on the walker.  Vitals remained stable t/o the effort and pt reports actually having less pain post session.    Stairs Stairs: Yes Stairs assistance: Supervision Stair Management: Two  rails Number of Stairs: 4 General stair comments: Pt was able to recall appropriate sequencing from previous TKA, negotiates up/down 4 steps w/o issue this afternoon   Wheelchair Mobility    Modified Rankin (Stroke Patients Only)       Balance Overall balance assessment: Modified Independent                                          Cognition Arousal/Alertness: Awake/alert Behavior During Therapy: WFL for tasks assessed/performed Overall Cognitive Status: Within Functional Limits for tasks assessed                                        Exercises Total Joint Exercises Ankle Circles/Pumps: AROM;10 reps Quad Sets: Strengthening;10 reps Short Arc Quad: AROM;10 reps Heel Slides: AAROM;5 reps Hip ABduction/ADduction: Strengthening;AROM;10 reps Straight Leg Raises: AROM;10 reps;AAROM Knee Flexion: PROM;5 reps Other Exercises Other Exercises: OT facilitates ed re: safety with ADLs including pet safety for fall prevention in the home environment. Polar care and compression stocking mgt, pt with good recall from prior knee surgery.    General Comments        Pertinent Vitals/Pain Pain Assessment: 0-10 Pain Score: 7  Pain Location: significant pain at incision Pain Descriptors / Indicators: Grimacing;Tender Pain Intervention(s): Monitored during session;Ice applied    Home Living Family/patient expects to be discharged to:: Private residence  Living Arrangements: Spouse/significant other Available Help at Discharge: Family;Available 24 hours/day (husband off work 2 weeks 24/7) Type of Home: House Home Access: Stairs to enter Entrance Stairs-Rails: Left;Right (wide) Home Layout: One level Home Equipment: [place] - single point;Tub bench;Walker - 2 wheels;Bedside commode      Prior Function Level of Independence: Independent with assistive device(s)      Comments: 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, stays active, retired OR nurse   PT Goals  (current goals can now be found in the care plan section) Acute Rehab PT Goals Patient Stated Goal: go home Progress towards PT goals: Progressing toward goals    Frequency    BID      PT Plan Current plan remains appropriate    Co-evaluation              AM-PAC PT "6 Clicks" Mobility   Outcome Measure  Help needed turning from your back to your side while in a flat bed without using bedrails?: None Help needed moving from lying on your back to sitting on the side of a flat bed without using bedrails?: None Help needed moving to and from a bed to a chair (including a wheelchair)?: None Help needed standing up from a chair using your arms (e.g., wheelchair or bedside chair)?: None Help needed to walk in hospital room?: None Help needed climbing 3-5 steps with a railing? : None 6 Click Score: 24    End of Session Equipment Utilized During Treatment: Gait belt Activity Tolerance: Patient tolerated treatment well;Patient limited by pain Patient left: with chair alarm set;with call bell/phone within reach Nurse Communication: Mobility status PT Visit Diagnosis: Muscle weakness (generalized) (M62.81);Difficulty in walking, not elsewhere classified (R26.2)     Time: 3838-1840 PT Time Calculation (min) (ACUTE ONLY): 40 min  Charges:  $Gait Training: 8-22 mins $Therapeutic Exercise: 8-22 mins $Therapeutic Activity: 8-22 mins                     Kreg Shropshire, DPT 12/24/2019, 3:54 PM

## 2019-12-24 NOTE — Evaluation (Signed)
Physical Therapy Evaluation Patient Details Name: Jordan Palmer MRN: [id number] DOB: [birth year] Today's Date: 12/24/2019   History of Present Illness  69 y/o female s/p L TKA 11/24, had R knee replaced ~7 months prior.  Clinical Impression  Pt did well with initial PT exam.  She was able to do mobility and transfers w/o assist, showed steadily increased confidence and speed with ~75 ft of ambulation and was able to do SLRs with minimal warm up.  She was having a lot of pain this AM (nursing gave pain meds early in the session) but showed good effort and motivation despite this.  Pt had 0-81 PROM available in the knee, vitals remained stable t/o the session on room air.      Follow Up Recommendations Outpatient PT;Follow surgeon's recommendation for DC plan and follow-up therapies    Equipment Recommendations  None recommended by PT    Recommendations for Other Services       Precautions / Restrictions Restrictions Weight Bearing Restrictions: Yes LLE Weight Bearing: Weight bearing as tolerated      Mobility  Bed Mobility Overal bed mobility: Independent             General bed mobility comments: Pt able to get herself to sitting EOB w/o issue or direct assist    Transfers Overall transfer level: Modified independent Equipment used: Rolling walker (2 wheeled)             General transfer comment: Pt was able to rise to standing w/o direct assist, needed walker to stabilize once upright  Ambulation/Gait Ambulation/Gait assistance: Supervision Gait Distance (Feet): 75 Feet Assistive device: Rolling walker (2 wheeled)       General Gait Details: Pt with expected initial hesitancy, but was able to increase confidence, speed, WBing and generally showed great POD1 cadence by the end of the effort.  Able to maintain walker momentum, no buckling or overt safety concerns.  Stairs            Wheelchair Mobility    Modified Rankin (Stroke Patients Only)        Balance Overall balance assessment: Modified Independent                                           Pertinent Vitals/Pain Pain Assessment: 0-10 Pain Score: 8  Pain Location: significant pain at incision    Home Living Family/patient expects to be discharged to:: Private residence Living Arrangements: Spouse/significant other Available Help at Discharge: Family;Available 24 hours/day (husband off work for 2 weeks to be around 24/7 PRN) Type of Home: House Home Access: Stairs to enter Entrance Stairs-Rails: Left;Right (wide) Entrance Stairs-Number of Steps: 3 Home Layout: One level Home Equipment: Cane - single point;Tub bench;Walker - 2 wheels;Bedside commode      Prior Function Level of Independence: Independent with assistive device(s)         Comments: 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, stays active, retired OR Nutritional therapist        Extremity/Trunk Assessment   Upper Extremity Assessment Upper Extremity Assessment: Overall WFL for tasks assessed    Lower Extremity Assessment Lower Extremity Assessment:  (expected post-op L weakness, able to SLR, pain limited )       Communication   Communication: No difficulties  Cognition Arousal/Alertness: Awake/alert Behavior During Therapy: WFL for tasks assessed/performed Overall Cognitive Status: Within  Functional Limits for tasks assessed                                        General Comments      Exercises Total Joint Exercises Ankle Circles/Pumps: AROM;10 reps Quad Sets: Strengthening;10 reps Short Arc Quad: AROM;AAROM;10 reps Heel Slides: AAROM;5 reps Hip ABduction/ADduction: Strengthening;AROM;10 reps Straight Leg Raises: AROM;10 reps Knee Flexion: PROM;5 reps Goniometric ROM: 0-81   Assessment/Plan    PT Assessment Patient needs continued PT services  PT Problem List Decreased strength;Decreased activity tolerance;Decreased range of motion;Decreased  balance;Decreased mobility;Decreased coordination;Decreased knowledge of use of DME;Decreased safety awareness;Pain       PT Treatment Interventions DME instruction;Gait training;Stair training;Functional mobility training;Therapeutic activities;Therapeutic exercise;Balance training;Neuromuscular re-education;Patient/family education    PT Goals (Current goals can be found in the Care Plan section)  Acute Rehab PT Goals Patient Stated Goal: go home PT Goal Formulation: With patient Time For Goal Achievement: 01/07/20 Potential to Achieve Goals: Good    Frequency BID   Barriers to discharge        Co-evaluation               AM-PAC PT "6 Clicks" Mobility  Outcome Measure Help needed turning from your back to your side while in a flat bed without using bedrails?: None Help needed moving from lying on your back to sitting on the side of a flat bed without using bedrails?: None Help needed moving to and from a bed to a chair (including a wheelchair)?: None Help needed standing up from a chair using your arms (e.g., wheelchair or bedside chair)?: None Help needed to walk in hospital room?: A Little Help needed climbing 3-5 steps with a railing? : A Little 6 Click Score: 22    End of Session Equipment Utilized During Treatment: Gait belt Activity Tolerance: Patient tolerated treatment well;Patient limited by pain Patient left: with chair alarm set;with call bell/phone within reach Nurse Communication: Mobility status PT Visit Diagnosis: Muscle weakness (generalized) (M62.81);Difficulty in walking, not elsewhere classified (R26.2)    Time: 3734-2876 PT Time Calculation (min) (ACUTE ONLY): 43 min   Charges:   PT Evaluation $PT Eval Low Complexity: 1 Low PT Treatments $Gait Training: 8-22 mins $Therapeutic Exercise: 8-22 mins        Kreg Shropshire, DPT 12/24/2019, 10:22 AM

## 2019-12-24 NOTE — Progress Notes (Signed)
  Subjective:  POD #1 s/p left total knee arthroplasty.   Patient reports left knee pain as moderate.  Patient made good progress with physical therapy today and was able to even go up and down stairs.  She feels ready to go home.  Her husband is in the room.  Objective:   VITALS:   Vitals:   12/24/19 0347 12/24/19 0938 12/24/19 1231 12/24/19 1707  BP: 117/64 129/60 130/68 127/62  Pulse: 93 82 76 86  Resp: 18 18 16 18   Temp: 98.2 F (36.8 C) 98.6 F (37 C) 98.4 F (36.9 C) 98.2 F (36.8 C)  TempSrc: Oral Oral Oral   SpO2: 97% 98% 98% 99%  Weight:      Height:        PHYSICAL EXAM: Left lower extremity Neurovascular intact Sensation intact distally Intact pulses distally Dorsiflexion/Plantar flexion intact Incision: scant drainage No cellulitis present Compartment soft  LABS  Results for orders placed or performed during the hospital encounter of 12/23/19 (from the past 24 hour(s))  CBC     Status: Abnormal   Collection Time: 12/24/19  5:27 AM  Result Value Ref Range   WBC 5.2 4.0 - 10.5 K/uL   RBC 3.00 (L) 3.87 - 5.11 MIL/uL   Hemoglobin 9.8 (L) 12.0 - 15.0 g/dL   HCT 29.1 (L) 36 - 46 %   MCV 97.0 80.0 - 100.0 fL   MCH 32.7 26.0 - 34.0 pg   MCHC 33.7 30.0 - 36.0 g/dL   RDW 12.8 11.5 - 15.5 %   Platelets 183 150 - 400 K/uL   nRBC 0.0 0.0 - 0.2 %  Basic metabolic panel     Status: Abnormal   Collection Time: 12/24/19  5:27 AM  Result Value Ref Range   Sodium 137 135 - 145 mmol/L   Potassium 3.8 3.5 - 5.1 mmol/L   Chloride 104 98 - 111 mmol/L   CO2 24 22 - 32 mmol/L   Glucose, Bld 95 70 - 99 mg/dL   BUN 22 8 - 23 mg/dL   Creatinine, Ser 1.01 (H) 0.44 - 1.00 mg/dL   Calcium 8.6 (L) 8.9 - 10.3 mg/dL   GFR, Estimated >60 >60 mL/min   Anion gap 9 5 - 15    DG Knee Left Port  Result Date: 12/23/2019 CLINICAL DATA:  Status post total left knee replacement. EXAM: PORTABLE LEFT KNEE - 1-2 VIEW COMPARISON:  MRI 10/08/2007. FINDINGS: Total left knee replacement.  Hardware intact. Anatomic alignment. No acute bony abnormality identified. IMPRESSION: Total left knee replacement with anatomic alignment. Electronically Signed   By: Marcello Moores  Register   On: 12/23/2019 11:50    Assessment/Plan: 1 Day Post-Op   Active Problems:   S/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, left   Patient is doing well postop.  She will be discharged home today with instructions to continue to elevate and use her Polar Care through the weekend.  Patient will remain on Lovenox 40 mg section daily for 2 weeks postop.  Patient will follow up with me in clinic within 10 to 14 days.  She has a scheduled appointment already.  Patient is due to see outpatient physical therapy on Monday.   Thornton Park , MD 12/24/2019, 5:39 PM

## 2019-12-24 NOTE — Anesthesia Postprocedure Evaluation (Signed)
Anesthesia Post Note  Patient: Jordan Palmer  Procedure(s) Performed: Left TOTAL KNEE ARTHROPLASTY (Left Knee)  Patient location during evaluation: Nursing Unit Anesthesia Type: Spinal Level of consciousness: awake, awake and alert and oriented Pain management: pain level controlled Vital Signs Assessment: post-procedure vital signs reviewed and stable Respiratory status: spontaneous breathing, nonlabored ventilation and respiratory function stable Cardiovascular status: stable Postop Assessment: no headache, no backache, no apparent nausea or vomiting, patient able to bend at knees and adequate PO intake Anesthetic complications: no   No complications documented.   Last Vitals:  Vitals:   12/24/19 0012 12/24/19 0347  BP: 132/65 117/64  Pulse: 89 93  Resp:  18  Temp: 36.9 C 36.8 C  SpO2: 94% 97%    Last Pain:  Vitals:   12/24/19 0347  TempSrc: Oral  PainSc:                  Ricki Miller

## 2019-12-29 ENCOUNTER — Other Ambulatory Visit: Payer: Self-pay

## 2019-12-29 ENCOUNTER — Encounter: Payer: Self-pay | Admitting: Physical Therapy

## 2019-12-29 ENCOUNTER — Ambulatory Visit: Payer: Medicare Other | Attending: Orthopedic Surgery | Admitting: Physical Therapy

## 2019-12-29 DIAGNOSIS — M25562 Pain in left knee: Secondary | ICD-10-CM | POA: Insufficient documentation

## 2019-12-29 DIAGNOSIS — Z96652 Presence of left artificial knee joint: Secondary | ICD-10-CM | POA: Diagnosis present

## 2019-12-29 DIAGNOSIS — M6281 Muscle weakness (generalized): Secondary | ICD-10-CM | POA: Insufficient documentation

## 2019-12-29 DIAGNOSIS — M25662 Stiffness of left knee, not elsewhere classified: Secondary | ICD-10-CM | POA: Insufficient documentation

## 2019-12-29 DIAGNOSIS — R262 Difficulty in walking, not elsewhere classified: Secondary | ICD-10-CM | POA: Insufficient documentation

## 2019-12-29 NOTE — Patient Instructions (Signed)
Access Code: [email or web address]: [email or web address]: 11/29/2021Prepared by: Legrand Como SherkExercises  Supine Ankle Pumps - 3 x daily - 7 x weekly - 2 sets - 10 reps  Supine Heel Slides - 3 x daily - 7 x weekly - 2 sets - 10 reps  Supine Quad Set - 3 x daily - 7 x weekly - 2 sets - 10 reps  Supine March - 3 x daily - 7 x weekly - 2 sets - 10 reps  Standing March with Counter Support - 3 x daily - 7 x weekly - 2 sets - 10 reps  Standing Hip Abduction with Counter Support - 3 x daily - 7 x weekly - 2 sets - 10 reps

## 2019-12-29 NOTE — Therapy (Signed)
[facility] [address] [place], [place], [address] Phone: [phone]   Fax:  [phone]  Physical Therapy Evaluation  Patient Details  Name: Jordan Palmer MRN: [id number] Date of Birth: 1950-09-12 Referring Provider (PT): Dr. Mack Guise   Encounter Date: 12/29/2019   PT End of Session - 12/29/19 0913    Visit Number 1    Number of Visits 9    Date for PT Re-Evaluation 01/26/20    Authorization - Visit Number 1    Authorization - Number of Visits 10    PT Start Time 0802    PT Stop Time 0903    PT Time Calculation (min) 61 min    Activity Tolerance Patient tolerated treatment well;Patient limited by pain    Behavior During Therapy [facility] for tasks assessed/performed           Past Medical History:  Diagnosis Date  . Arthritis   . Hyperlipemia   . Hypertension   . Hypothyroidism   . Sleep apnea   . Vertigo     Past Surgical History:  Procedure Laterality Date  . ANTERIOR AND POSTERIOR REPAIR N/A 03/12/2017   Procedure: ANTERIOR (CYSTOCELE) AND POSTERIOR REPAIR (RECTOCELE);  Surgeon: Salvadore Dom, MD;  Location: [facility];  Service: Gynecology;  Laterality: N/A;  . CARPAL TUNNEL RELEASE Bilateral   . CHOLECYSTECTOMY    . CYSTOSCOPY N/A 03/12/2017   Procedure: CYSTOSCOPY;  Surgeon: Salvadore Dom, MD;  Location: [facility];  Service: Gynecology;  Laterality: N/A;  . EYE SURGERY    . KNEE ARTHROSCOPY Left   . KNEE ARTHROSCOPY WITH MEDIAL MENISECTOMY Right 02/13/2019   Procedure: KNEE ARTHROSCOPY WITH MEDIAL MENISECTOMY;  Surgeon: Thornton Park, MD;  Location: [facility];  Service: Orthopedics;  Laterality: Right;  . LACRIMAL DUCT PROBING W/ DACRYOPLASTY Bilateral   . SALPINGOOPHORECTOMY Left 03/12/2017   Procedure: SALPINGECTOMY;  Surgeon: Salvadore Dom, MD;  Location[facility];  Service: Gynecology;  Laterality: Left;  . SHOULDER ARTHROSCOPY WITH  OPEN ROTATOR CUFF REPAIR Right 09/03/2014   Procedure: SHOULDER ARTHROSCOPY WITH OPEN ROTATOR CUFF REPAIR;  Surgeon: Thornton Park, MD;  Location: [facility];  Service: Orthopedics;  Laterality: Right;  . THYROIDECTOMY Right    hemi-thyroidectomy  . TOTAL KNEE ARTHROPLASTY Right 05/20/2019   Procedure: TOTAL KNEE ARTHROPLASTY;  Surgeon: Thornton Park, MD;  Location: [facility];  Service: Orthopedics;  Laterality: Right;  . TOTAL KNEE ARTHROPLASTY Left 12/23/2019   Procedure: Left TOTAL KNEE ARTHROPLASTY;  Surgeon: Thornton Park, MD;  Location: [facility];  Service: Orthopedics;  Laterality: Left;  Marland Kitchen VAGINAL HYSTERECTOMY N/A 03/12/2017   Procedure: HYSTERECTOMY VAGINAL;  Surgeon: Salvadore Dom, MD;  Location: [facility];  Service: Gynecology;  Laterality: N/A;    There were no vitals filed for this visit.    Subjective Assessment - 12/29/19 0905    Subjective Pt. s/p L TKA on 11/23 and states she has been hurting a lot over the past week.  Pt. reports 7/10 L knee pain currently on pain medications at rest prior to evaluation.    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  Pt. received PT prior to/ after knee scope this year.  See PT notes.  Pt. known well to PT clinic after recent R TKA.    Limitations Standing;Walking;House hold activities    Patient Stated Goals Increase L knee ROM/ strength/ improve pain-free walking.  Currently in Pain? Yes    Pain Score 7     Pain Location Knee    Pain Orientation Left    Aggravating Factors  knee flexion/ position changes.    Pain Relieving Factors recliner position and meds/ice           See flowsheet  L antalgic gait pattern with RW in clinic.  Pt. Able to increase L hip/knee flexion during step through phase of gait while walking in //-bars.  Cuing to increase posture/ proper head position  Step to gait on stairs with heavy UE assist on handrails  Extra time/ assist to step into  passenger side of SUV     Objective measurements completed on examination: See above findings.    See HEP     PT Education - 12/29/19 0912    Education Details See HEP/ discussed ex. program issued from hospital PT    Person(s) Educated Patient    Methods Explanation;Demonstration;Handout    Comprehension Verbalized understanding;Returned demonstration               PT Long Term Goals - 12/29/19 0920      PT LONG TERM GOAL #1   Title Pt. will increase FOTO to 54 to improve functional mobility.    Baseline initial FOTO: 18.    Time 4    Period Weeks    Status New    Target Date 01/26/20      PT LONG TERM GOAL #2   Title Pt. will increase L knee AROM (0 to >115 deg.) to improve independence with gait.    Baseline L knee AROM:  -7 to 84 deg.    Time 4    Period Weeks    Status New    Target Date 01/26/20      PT LONG TERM GOAL #3   Title Patient will demonstrate improved strength of BLE as evidenced by 1/2 increase in MMT for increased function at home and in the community.    Baseline L hip flexion/ abduction strength 4/5 MMT, L quad 3/5 MMT, L hamstring 3/5 MMT.    Time 4    Period Weeks    Status New    Target Date 01/26/20      PT LONG TERM GOAL #4   Title Pt. able to ambulate with consistent 2-point gait pattern and use of SPC on level surfaces safely.    Baseline pt. currently amb. with RW.    Time 4    Period Weeks    Status New    Target Date 01/26/20      PT LONG TERM GOAL #5   Title Pt. able to ambulate with consistent recip. pattern outside on grassy terrain with no issues to improve mobility at home.    Baseline TBD    Time 4    Period Weeks    Status New    Target Date 01/26/20      PT LONG TERM GOAL #6   Title Pt. able to ascend/ descend 10 stairs with recip. pattern and no UE assist to improve mobility.    Baseline handrails required.    Time 4    Period Weeks    Status New    Target Date 01/26/20              Plan -  12/29/19 0915    Clinical Impression Statement Pt. is a pleasant 69 y/o female s/p L TKA on 12/23/19.  Pt. reports 7/10 L knee pain  currently at rest and presents with bandages/ wrap around L knee.  Pt. scheduled to return to MD next Monday for stitches to be removed.  L knee AROM (-7 to 84 deg.)- pain limited/ bandages in place.  R knee AROM: (-1 to 121 deg.).  L hip flexion/ abduction strength 4/5 MMT, L quad 3/5 MMT, L hamstring 3/5 MMT.  Unable to evaluate L knee incision/ swelling at this time.  Pt. ambulates with moderate L antalgic gait pattern with RW in PT clinic.  Limited L hip/knee flexion during swing through phase of gait.  FOTO: initial 18/ goal 54.  Pt. will benefit from skilled PT services to increse L knee ROM/ strength to improve functional mobility/ pain-free walking.    Personal Factors and Comorbidities Profession;Past/Current Experience;Time since onset of injury/illness/exacerbation;Fitness;Comorbidity 3+;Behavior Pattern;Age    Comorbidities HTN, HLD, OA, sleep apnea, vertigo    Examination-Activity Limitations Squat;Lift;Stairs;Stand;Locomotion Level;Sit;Transfers;Sleep;Bend;Bed Mobility    Stability/Clinical Decision Making Evolving/Moderate complexity    Clinical Decision Making Moderate    Rehab Potential Good    PT Frequency 2x / week    PT Duration 4 weeks    PT Treatment/Interventions ADLs/Self Care Home Management;Cryotherapy;Electrical Stimulation;Moist Heat;Stair training;Gait training;Therapeutic activities;Functional mobility training;Therapeutic exercise;Balance training;Orthotic Fit/Training;Patient/family education;Neuromuscular re-education;Manual techniques;Dry needling;Passive range of motion;Scar mobilization;Taping;Joint Manipulations    PT Next Visit Plan Progress L knee ROM/ strengthening.    PT Home Exercise Plan [email or web address]    Consulted and Agree with Plan of Care Patient           Patient will benefit from skilled therapeutic intervention in order to  improve the following deficits and impairments:  Abnormal gait, Decreased balance, Decreased endurance, Decreased mobility, Difficulty walking, Pain, Postural dysfunction, Decreased strength, Decreased activity tolerance, Decreased range of motion, Improper body mechanics, Impaired flexibility, Hypomobility  Visit Diagnosis: S/P TKR (total knee replacement), left  Difficulty in walking, not elsewhere classified  Joint stiffness of knee, left  Muscle weakness (generalized)  Acute pain of left knee     Problem List Patient Active Problem List   Diagnosis Date Noted  . S/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, left 12/23/2019  . S/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, right 05/20/2019  . H/O total vaginal hysterectomy 03/12/2017  . Essential hypertension 03/08/2017  . Mixed dyslipidemia 03/08/2017  . Abnormal EKG 03/08/2017  . Preoperative cardiovascular examination 03/08/2017  . Allergic state 12/11/2013  . High cholesterol 12/11/2013  . History of depression 12/11/2013  . Hypothyroidism 12/11/2013  . Sleep apnea 12/11/2013   Pura Spice, PT, DPT # [id number] 12/29/2019, 9:25 AM  [facility] [address] [place], [place], [address] Phone: [phone]   Fax:  [phone]  Name: Jordan Palmer MRN: [id number] Date of Birth: 1950/03/29

## 2019-12-31 ENCOUNTER — Other Ambulatory Visit: Payer: Self-pay

## 2019-12-31 ENCOUNTER — Ambulatory Visit: Payer: Medicare Other | Attending: Orthopedic Surgery | Admitting: Physical Therapy

## 2019-12-31 ENCOUNTER — Encounter: Payer: Self-pay | Admitting: Physical Therapy

## 2019-12-31 DIAGNOSIS — M25562 Pain in left knee: Secondary | ICD-10-CM | POA: Insufficient documentation

## 2019-12-31 DIAGNOSIS — R262 Difficulty in walking, not elsewhere classified: Secondary | ICD-10-CM | POA: Insufficient documentation

## 2019-12-31 DIAGNOSIS — M25662 Stiffness of left knee, not elsewhere classified: Secondary | ICD-10-CM | POA: Insufficient documentation

## 2019-12-31 DIAGNOSIS — M6281 Muscle weakness (generalized): Secondary | ICD-10-CM | POA: Insufficient documentation

## 2019-12-31 DIAGNOSIS — Z96652 Presence of left artificial knee joint: Secondary | ICD-10-CM | POA: Diagnosis not present

## 2019-12-31 NOTE — Therapy (Signed)
[facility] [address] [place], [place], [address] Phone: [phone]   Fax:  [phone]  Physical Therapy Treatment  Patient Details  Name: Jordan Palmer MRN: [id number] Date of Birth: 06-23-1950 Referring Provider (PT): Dr. Mack Guise   Encounter Date: 12/31/2019   PT End of Session - 12/31/19 1003    Visit Number 2    Number of Visits 9    Date for PT Re-Evaluation 01/26/20    Authorization - Visit Number 2    Authorization - Number of Visits 10    PT Start Time 0803    PT Stop Time 9326    PT Time Calculation (min) 51 min    Activity Tolerance Patient tolerated treatment well;Patient limited by pain    Behavior During Therapy [facility] for tasks assessed/performed           Past Medical History:  Diagnosis Date  . Arthritis   . Hyperlipemia   . Hypertension   . Hypothyroidism   . Sleep apnea   . Vertigo     Past Surgical History:  Procedure Laterality Date  . ANTERIOR AND POSTERIOR REPAIR N/A 03/12/2017   Procedure: ANTERIOR (CYSTOCELE) AND POSTERIOR REPAIR (RECTOCELE);  Surgeon: Salvadore Dom, MD;  Location: [facility];  Service: Gynecology;  Laterality: N/A;  . CARPAL TUNNEL RELEASE Bilateral   . CHOLECYSTECTOMY    . CYSTOSCOPY N/A 03/12/2017   Procedure: CYSTOSCOPY;  Surgeon: Salvadore Dom, MD;  Location: [facility];  Service: Gynecology;  Laterality: N/A;  . EYE SURGERY    . KNEE ARTHROSCOPY Left   . KNEE ARTHROSCOPY WITH MEDIAL MENISECTOMY Right 02/13/2019   Procedure: KNEE ARTHROSCOPY WITH MEDIAL MENISECTOMY;  Surgeon: Thornton Park, MD;  Location: [facility];  Service: Orthopedics;  Laterality: Right;  . LACRIMAL DUCT PROBING W/ DACRYOPLASTY Bilateral   . SALPINGOOPHORECTOMY Left 03/12/2017   Procedure: SALPINGECTOMY;  Surgeon: Salvadore Dom, MD;  Location: [facility];  Service: Gynecology;  Laterality: Left;  . SHOULDER ARTHROSCOPY WITH OPEN  ROTATOR CUFF REPAIR Right 09/03/2014   Procedure: SHOULDER ARTHROSCOPY WITH OPEN ROTATOR CUFF REPAIR;  Surgeon: Thornton Park, MD;  Location: [facility];  Service: Orthopedics;  Laterality: Right;  . THYROIDECTOMY Right    hemi-thyroidectomy  . TOTAL KNEE ARTHROPLASTY Right 05/20/2019   Procedure: TOTAL KNEE ARTHROPLASTY;  Surgeon: Thornton Park, MD;  Location: [facility];  Service: Orthopedics;  Laterality: Right;  . TOTAL KNEE ARTHROPLASTY Left 12/23/2019   Procedure: Left TOTAL KNEE ARTHROPLASTY;  Surgeon: Thornton Park, MD;  Location: [facility];  Service: Orthopedics;  Laterality: Left;  Marland Kitchen VAGINAL HYSTERECTOMY N/A 03/12/2017   Procedure: HYSTERECTOMY VAGINAL;  Surgeon: Salvadore Dom, MD;  Location: [facility];  Service: Gynecology;  Laterality: N/A;    There were no vitals filed for this visit.   Subjective Assessment - 12/31/19 0953    Subjective Pt. reports high levels of L knee pain this morning.  Pt. continues to take pain meds as prescribed.  Pt. driven to PT by husband and entered PT with RW.    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  Pt. received PT prior to/ after knee scope this year.  See PT notes.  Pt. known well to PT clinic after recent R TKA.    Limitations Standing;Walking;House hold activities    Patient Stated Goals Increase L knee ROM/ strength/ improve pain-free walking.    Currently in  Pain? Yes    Pain Score 7     Pain Location Knee    Pain Orientation Left            There.ex.:  Walking in //-bars with hip/knee flexion/ step pattern and heel strike 4x. Standing heel and toe raises/ hip abduction 20x each. Walking in clinic/ hallway with RW and cuing to increase L knee flexion  Supine heel slides/ hip abduction/ marching/ SAQ/ ankle pumps 10x each.   Reviewed/ discussed HEP  Manual tx.:  Supine L patellar mobs. (all planes)- 10x with discomfort noted with sup./inf.  Supine L knee/hip flexion  stretches (manual)- 4x with static holds as tolerated  Supine L knee PROM 90 deg. (pain)  STM to L quad/ distal hamstring/ prox. gastroc in supine position.      PT Long Term Goals - 12/29/19 0920      PT LONG TERM GOAL #1   Title Pt. will increase FOTO to 54 to improve functional mobility.    Baseline initial FOTO: 18.    Time 4    Period Weeks    Status New    Target Date 01/26/20      PT LONG TERM GOAL #2   Title Pt. will increase L knee AROM (0 to >115 deg.) to improve independence with gait.    Baseline L knee AROM:  -7 to 84 deg.    Time 4    Period Weeks    Status New    Target Date 01/26/20      PT LONG TERM GOAL #3   Title Patient will demonstrate improved strength of BLE as evidenced by 1/2 increase in MMT for increased function at home and in the community.    Baseline L hip flexion/ abduction strength 4/5 MMT, L quad 3/5 MMT, L hamstring 3/5 MMT.    Time 4    Period Weeks    Status New    Target Date 01/26/20      PT LONG TERM GOAL #4   Title Pt. able to ambulate with consistent 2-point gait pattern and use of SPC on level surfaces safely.    Baseline pt. currently amb. with RW.    Time 4    Period Weeks    Status New    Target Date 01/26/20      PT LONG TERM GOAL #5   Title Pt. able to ambulate with consistent recip. pattern outside on grassy terrain with no issues to improve mobility at home.    Baseline TBD    Time 4    Period Weeks    Status New    Target Date 01/26/20      PT LONG TERM GOAL #6   Title Pt. able to ascend/ descend 10 stairs with recip. pattern and no UE assist to improve mobility.    Baseline handrails required.    Time 4    Period Weeks    Status New    Target Date 01/26/20                 Plan - 12/31/19 1003    Clinical Impression Statement Significant c/o L distal quad pain with all supine ex. (hip flexion/ heel slides/ quad sets/ SAQ).  PT removed ace wrap on L leg to assess moderate bruising at varying area of  upper/ lower leg.  Good patellar mobility all planes with pain noted during sup./ inferior directions.  Supine L knee PROM flexion 90 deg.  Pt. understands importance of  completing HEP and walking on a daily basis.    Personal Factors and Comorbidities Profession;Past/Current Experience;Time since onset of injury/illness/exacerbation;Fitness;Comorbidity 3+;Behavior Pattern;Age    Comorbidities HTN, HLD, OA, sleep apnea, vertigo    Examination-Activity Limitations Squat;Lift;Stairs;Stand;Locomotion Level;Sit;Transfers;Sleep;Bend;Bed Mobility    Stability/Clinical Decision Making Evolving/Moderate complexity    Clinical Decision Making Moderate    Rehab Potential Good    PT Frequency 2x / week    PT Duration 4 weeks    PT Treatment/Interventions ADLs/Self Care Home Management;Cryotherapy;Electrical Stimulation;Moist Heat;Stair training;Gait training;Therapeutic activities;Functional mobility training;Therapeutic exercise;Balance training;Orthotic Fit/Training;Patient/family education;Neuromuscular re-education;Manual techniques;Dry needling;Passive range of motion;Scar mobilization;Taping;Joint Manipulations    PT Next Visit Plan Progress L knee ROM/ strengthening.    PT Home Exercise Plan [id number]    Consulted and Agree with Plan of Care Patient           Patient will benefit from skilled therapeutic intervention in order to improve the following deficits and impairments:  Abnormal gait, Decreased balance, Decreased endurance, Decreased mobility, Difficulty walking, Pain, Postural dysfunction, Decreased strength, Decreased activity tolerance, Decreased range of motion, Improper body mechanics, Impaired flexibility, Hypomobility  Visit Diagnosis: S/P TKR (total knee replacement), left  Difficulty in walking, not elsewhere classified  Joint stiffness of knee, left  Muscle weakness (generalized)  Acute pain of left knee     Problem List Patient Active Problem List   Diagnosis Date  Noted  . S/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, left 12/23/2019  . S/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, right 05/20/2019  . H/O total vaginal hysterectomy 03/12/2017  . Essential hypertension 03/08/2017  . Mixed dyslipidemia 03/08/2017  . Abnormal EKG 03/08/2017  . Preoperative cardiovascular examination 03/08/2017  . Allergic state 12/11/2013  . High cholesterol 12/11/2013  . History of depression 12/11/2013  . Hypothyroidism 12/11/2013  . Sleep apnea 12/11/2013   Pura Spice, PT, DPT # [id number] 12/31/2019, 10:11 AM  [facility] [address] [place], [place], [address] Phone: [phone]   Fax:  [phone]  Name: ARCHANA ECKMAN MRN: [id number] Date of Birth: 1950-10-27

## 2020-01-05 ENCOUNTER — Other Ambulatory Visit: Payer: Self-pay

## 2020-01-05 ENCOUNTER — Encounter: Payer: Self-pay | Admitting: Physical Therapy

## 2020-01-05 ENCOUNTER — Ambulatory Visit: Payer: Medicare Other | Admitting: Physical Therapy

## 2020-01-05 DIAGNOSIS — R262 Difficulty in walking, not elsewhere classified: Secondary | ICD-10-CM

## 2020-01-05 DIAGNOSIS — M6281 Muscle weakness (generalized): Secondary | ICD-10-CM

## 2020-01-05 DIAGNOSIS — M25562 Pain in left knee: Secondary | ICD-10-CM

## 2020-01-05 DIAGNOSIS — Z96652 Presence of left artificial knee joint: Secondary | ICD-10-CM | POA: Diagnosis not present

## 2020-01-05 DIAGNOSIS — M25662 Stiffness of left knee, not elsewhere classified: Secondary | ICD-10-CM

## 2020-01-05 NOTE — Therapy (Signed)
[facility] [address] [place], [place], [address] Phone: [phone]   Fax:  [phone]  Physical Therapy Treatment  Patient Details  Name: Jordan Palmer MRN: [id number] Date of Birth: 23-Jul-1950 Referring Provider (PT): Dr. Mack Guise   Encounter Date: 01/05/2020   PT End of Session - 01/05/20 1207    Visit Number 3    Number of Visits 9    Date for PT Re-Evaluation 01/26/20    Authorization - Visit Number 3    Authorization - Number of Visits 10    PT Start Time 0801    PT Stop Time 0850    PT Time Calculation (min) 49 min    Activity Tolerance Patient tolerated treatment well;Patient limited by pain    Behavior During Therapy [facility] for tasks assessed/performed           Past Medical History:  Diagnosis Date  . Arthritis   . Hyperlipemia   . Hypertension   . Hypothyroidism   . Sleep apnea   . Vertigo     Past Surgical History:  Procedure Laterality Date  . ANTERIOR AND POSTERIOR REPAIR N/A 03/12/2017   Procedure: ANTERIOR (CYSTOCELE) AND POSTERIOR REPAIR (RECTOCELE);  Surgeon: Salvadore Dom, MD;  Location: [facility];  Service: Gynecology;  Laterality: N/A;  . CARPAL TUNNEL RELEASE Bilateral   . CHOLECYSTECTOMY    . CYSTOSCOPY N/A 03/12/2017   Procedure: CYSTOSCOPY;  Surgeon: Salvadore Dom, MD;  Location: [facility];  Service: Gynecology;  Laterality: N/A;  . EYE SURGERY    . KNEE ARTHROSCOPY Left   . KNEE ARTHROSCOPY WITH MEDIAL MENISECTOMY Right 02/13/2019   Procedure: KNEE ARTHROSCOPY WITH MEDIAL MENISECTOMY;  Surgeon: Thornton Park, MD;  Location: [facility];  Service: Orthopedics;  Laterality: Right;  . LACRIMAL DUCT PROBING W/ DACRYOPLASTY Bilateral   . SALPINGOOPHORECTOMY Left 03/12/2017   Procedure: SALPINGECTOMY;  Surgeon: Salvadore Dom, MD;  Location: [facility];  Service: Gynecology;  Laterality: Left;  . SHOULDER ARTHROSCOPY WITH OPEN  ROTATOR CUFF REPAIR Right 09/03/2014   Procedure: SHOULDER ARTHROSCOPY WITH OPEN ROTATOR CUFF REPAIR;  Surgeon: Thornton Park, MD;  Location: [facility];  Service: Orthopedics;  Laterality: Right;  . THYROIDECTOMY Right    hemi-thyroidectomy  . TOTAL KNEE ARTHROPLASTY Right 05/20/2019   Procedure: TOTAL KNEE ARTHROPLASTY;  Surgeon: Thornton Park, MD;  Location: [facility];  Service: Orthopedics;  Laterality: Right;  . TOTAL KNEE ARTHROPLASTY Left 12/23/2019   Procedure: Left TOTAL KNEE ARTHROPLASTY;  Surgeon: Thornton Park, MD;  Location: [facility];  Service: Orthopedics;  Laterality: Left;  Marland Kitchen VAGINAL HYSTERECTOMY N/A 03/12/2017   Procedure: HYSTERECTOMY VAGINAL;  Surgeon: Salvadore Dom, MD;  Location: [facility];  Service: Gynecology;  Laterality: N/A;    There were no vitals filed for this visit.   Subjective Assessment - 01/05/20 1042    Subjective Pt. entered PT with use of RW and states she feels she can progress to [facility].  Pt. reports less pain than previous tx. session and continues to sleep in recliner at home.    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  Pt. received PT prior to/ after knee scope this year.  See PT notes.  Pt. known well to PT clinic after recent R TKA.    Limitations Standing;Walking;House hold activities    Patient Stated Goals Increase L knee ROM/ strength/ improve pain-free walking.  Currently in Pain? Yes    Pain Score 5     Pain Location Knee    Pain Orientation Left           There.ex.:  Walking around PT clinic with use of RW and progressing to [facility] in //-bars and then hallway.  Initial cuing for proper sequencing of 2-point gait pattern and upright posture.   Walking in //-bars: forward/ backwards/ lateral (upright posture with mirror feedback)  Standing hip ex.: flexion/ abduction/ knee flexion (limited on L)/ hip extension 20x (light UE assist on //-bars).    Supine ex.: heel slides/ SAQ with  no bolster/ SLR/ bolster bridging 20x.  Manual tx.:  Supine L knee flexion/ extension 5x each with static holds (-3 to 96 deg.)  Patellar mobs. (all planes).       PT Long Term Goals - 12/29/19 0920      PT LONG TERM GOAL #1   Title Pt. will increase FOTO to 54 to improve functional mobility.    Baseline initial FOTO: 18.    Time 4    Period Weeks    Status New    Target Date 01/26/20      PT LONG TERM GOAL #2   Title Pt. will increase L knee AROM (0 to >115 deg.) to improve independence with gait.    Baseline L knee AROM:  -7 to 84 deg.    Time 4    Period Weeks    Status New    Target Date 01/26/20      PT LONG TERM GOAL #3   Title Patient will demonstrate improved strength of BLE as evidenced by 1/2 increase in MMT for increased function at home and in the community.    Baseline L hip flexion/ abduction strength 4/5 MMT, L quad 3/5 MMT, L hamstring 3/5 MMT.    Time 4    Period Weeks    Status New    Target Date 01/26/20      PT LONG TERM GOAL #4   Title Pt. able to ambulate with consistent 2-point gait pattern and use of SPC on level surfaces safely.    Baseline pt. currently amb. with RW.    Time 4    Period Weeks    Status New    Target Date 01/26/20      PT LONG TERM GOAL #5   Title Pt. able to ambulate with consistent recip. pattern outside on grassy terrain with no issues to improve mobility at home.    Baseline TBD    Time 4    Period Weeks    Status New    Target Date 01/26/20      PT LONG TERM GOAL #6   Title Pt. able to ascend/ descend 10 stairs with recip. pattern and no UE assist to improve mobility.    Baseline handrails required.    Time 4    Period Weeks    Status New    Target Date 01/26/20                 Plan - 01/05/20 1207    Clinical Impression Statement Pt. demonstrates ability to complete 10 SLR on L LE with good knee extension/ quad control.  Pt. presents with supine L knee AROM: -3 to 96 deg.  L knee flexion remains pain  limited.  Pt. has progressed to use of SPC with 2-point gait pattern in clinic with good sequencing.  Good technique with standing ther.ex. and progression  to [facility].    Personal Factors and Comorbidities Profession;Past/Current Experience;Time since onset of injury/illness/exacerbation;Fitness;Comorbidity 3+;Behavior Pattern;Age    Comorbidities HTN, HLD, OA, sleep apnea, vertigo    Examination-Activity Limitations Squat;Lift;Stairs;Stand;Locomotion Level;Sit;Transfers;Sleep;Bend;Bed Mobility    Stability/Clinical Decision Making Evolving/Moderate complexity    Clinical Decision Making Moderate    Rehab Potential Good    PT Frequency 2x / week    PT Duration 4 weeks    PT Treatment/Interventions ADLs/Self Care Home Management;Cryotherapy;Electrical Stimulation;Moist Heat;Stair training;Gait training;Therapeutic activities;Functional mobility training;Therapeutic exercise;Balance training;Orthotic Fit/Training;Patient/family education;Neuromuscular re-education;Manual techniques;Dry needling;Passive range of motion;Scar mobilization;Taping;Joint Manipulations    PT Next Visit Plan Progress L knee ROM/ strengthening.  Discuss MD f/u and issue new HEP    PT [place] and Agree with Plan of Care Patient           Patient will benefit from skilled therapeutic intervention in order to improve the following deficits and impairments:  Abnormal gait, Decreased balance, Decreased endurance, Decreased mobility, Difficulty walking, Pain, Postural dysfunction, Decreased strength, Decreased activity tolerance, Decreased range of motion, Improper body mechanics, Impaired flexibility, Hypomobility  Visit Diagnosis: S/P TKR (total knee replacement), left  Difficulty in walking, not elsewhere classified  Joint stiffness of knee, left  Muscle weakness (generalized)  Acute pain of left knee     Problem List Patient Active Problem List   Diagnosis Date Noted  . S/P TKR  (total knee replacement) using cement, left 12/23/2019  . S/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, right 05/20/2019  . H/O total vaginal hysterectomy 03/12/2017  . Essential hypertension 03/08/2017  . Mixed dyslipidemia 03/08/2017  . Abnormal EKG 03/08/2017  . Preoperative cardiovascular examination 03/08/2017  . Allergic state 12/11/2013  . High cholesterol 12/11/2013  . History of depression 12/11/2013  . Hypothyroidism 12/11/2013  . Sleep apnea 12/11/2013   Pura Spice, PT, DPT # [phone] 01/05/2020, 12:12 PM  [place] [facility] [address] [place], [place], [address] Phone: [phone]   Fax:  [phone]  Name: Jordan Palmer MRN: [id number] Date of Birth: [birth year]

## 2020-01-07 ENCOUNTER — Ambulatory Visit: Payer: Medicare Other | Admitting: Physical Therapy

## 2020-01-07 ENCOUNTER — Other Ambulatory Visit: Payer: Self-pay

## 2020-01-07 ENCOUNTER — Encounter: Payer: Self-pay | Admitting: Physical Therapy

## 2020-01-07 DIAGNOSIS — M25662 Stiffness of left knee, not elsewhere classified: Secondary | ICD-10-CM

## 2020-01-07 DIAGNOSIS — Z96652 Presence of left artificial knee joint: Secondary | ICD-10-CM

## 2020-01-07 DIAGNOSIS — R262 Difficulty in walking, not elsewhere classified: Secondary | ICD-10-CM

## 2020-01-07 DIAGNOSIS — M6281 Muscle weakness (generalized): Secondary | ICD-10-CM

## 2020-01-07 DIAGNOSIS — M25562 Pain in left knee: Secondary | ICD-10-CM

## 2020-01-07 NOTE — Therapy (Signed)
[facility] [address] [place], [place], [address] Phone: [phone]   Fax:  [phone]  Physical Therapy Treatment  Patient Details  Name: Jordan Palmer MRN: [id number] Date of Birth: 1951-01-25 Referring Provider (PT): Dr. Mack Guise   Encounter Date: 01/07/2020   PT End of Session - 01/07/20 0812    Visit Number 4    Number of Visits 9    Date for PT Re-Evaluation 01/26/20    Authorization - Visit Number 4    Authorization - Number of Visits 10    PT Start Time 0809    PT Stop Time 0905    PT Time Calculation (min) 56 min    Activity Tolerance Patient tolerated treatment well;Patient limited by pain    Behavior During Therapy [facility] for tasks assessed/performed           Past Medical History:  Diagnosis Date  . Arthritis   . Hyperlipemia   . Hypertension   . Hypothyroidism   . Sleep apnea   . Vertigo     Past Surgical History:  Procedure Laterality Date  . ANTERIOR AND POSTERIOR REPAIR N/A 03/12/2017   Procedure: ANTERIOR (CYSTOCELE) AND POSTERIOR REPAIR (RECTOCELE);  Surgeon: Salvadore Dom, MD;  Location: [facility];  Service: Gynecology;  Laterality: N/A;  . CARPAL TUNNEL RELEASE Bilateral   . CHOLECYSTECTOMY    . CYSTOSCOPY N/A 03/12/2017   Procedure: CYSTOSCOPY;  Surgeon: Salvadore Dom, MD;  Location: [facility];  Service: Gynecology;  Laterality: N/A;  . EYE SURGERY    . KNEE ARTHROSCOPY Left   . KNEE ARTHROSCOPY WITH MEDIAL MENISECTOMY Right 02/13/2019   Procedure: KNEE ARTHROSCOPY WITH MEDIAL MENISECTOMY;  Surgeon: Thornton Park, MD;  Location: [facility];  Service: Orthopedics;  Laterality: Right;  . LACRIMAL DUCT PROBING W/ DACRYOPLASTY Bilateral   . SALPINGOOPHORECTOMY Left 03/12/2017   Procedure: SALPINGECTOMY;  Surgeon: Salvadore Dom, MD;  Location: [facility];  Service: Gynecology;  Laterality: Left;  . SHOULDER ARTHROSCOPY WITH OPEN  ROTATOR CUFF REPAIR Right 09/03/2014   Procedure: SHOULDER ARTHROSCOPY WITH OPEN ROTATOR CUFF REPAIR;  Surgeon: Thornton Park, MD;  Location: [facility];  Service: Orthopedics;  Laterality: Right;  . THYROIDECTOMY Right    hemi-thyroidectomy  . TOTAL KNEE ARTHROPLASTY Right 05/20/2019   Procedure: TOTAL KNEE ARTHROPLASTY;  Surgeon: Thornton Park, MD;  Location: [facility];  Service: Orthopedics;  Laterality: Right;  . TOTAL KNEE ARTHROPLASTY Left 12/23/2019   Procedure: Left TOTAL KNEE ARTHROPLASTY;  Surgeon: Thornton Park, MD;  Location: [facility];  Service: Orthopedics;  Laterality: Left;  Marland Kitchen VAGINAL HYSTERECTOMY N/A 03/12/2017   Procedure: HYSTERECTOMY VAGINAL;  Surgeon: Salvadore Dom, MD;  Location: [facility];  Service: Gynecology;  Laterality: N/A;    There were no vitals filed for this visit.   Subjective Assessment - 01/07/20 0810    Subjective Pt. walking with use of SPC and states her staples are still in place.  She went to MD on Monday after PT appointment and when they took off the bandage, pt. reports scab started to bleed.  MD wants to wait until next Monday to remove staples.  Pt. taking Gabapentin at night time due to making tired.    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  Pt. received PT prior to/ after knee scope this year.  See PT notes.  Pt. known well to PT clinic after recent  R TKA.    Limitations Standing;Walking;House hold activities    Patient Stated Goals Increase L knee ROM/ strength/ improve pain-free walking.    Currently in Pain? Yes    Pain Score 5     Pain Location Knee    Pain Orientation Left    Pain Descriptors / Indicators Tightness;Aching;Burning    Pain Type Surgical pain           There.ex.:  High marching/ lateral walking in //-bars with increase knee flexion 8x each.  Pharmacist, hospital.  Standing hip ex.: flexion/ extension/ hip abduction/ heel raises/ knee flexion 20x (cuing for upright  posture/ no lateral leaning).    Supine heel slides/ quad sets (manual feedback/ holds)/ knee to chest 10x each.  Reassessed L knee flexion (101 deg.)- pain limited.   Standing 2nd step L knee flexion/ hamstring stretches 8x each with static holds.  Manual tx.:  Supine L patella mobs. (all planes)  Supine L knee AA/PROM: flexion with holds (pain limited)- 30 sec. Holds.   STM to L quad/ prox, gastroc. During stretching.          PT Long Term Goals - 12/29/19 0920      PT LONG TERM GOAL #1   Title Pt. will increase FOTO to 54 to improve functional mobility.    Baseline initial FOTO: 18.    Time 4    Period Weeks    Status New    Target Date 01/26/20      PT LONG TERM GOAL #2   Title Pt. will increase L knee AROM (0 to >115 deg.) to improve independence with gait.    Baseline L knee AROM:  -7 to 84 deg.    Time 4    Period Weeks    Status New    Target Date 01/26/20      PT LONG TERM GOAL #3   Title Patient will demonstrate improved strength of BLE as evidenced by 1/2 increase in MMT for increased function at home and in the community.    Baseline L hip flexion/ abduction strength 4/5 MMT, L quad 3/5 MMT, L hamstring 3/5 MMT.    Time 4    Period Weeks    Status New    Target Date 01/26/20      PT LONG TERM GOAL #4   Title Pt. able to ambulate with consistent 2-point gait pattern and use of SPC on level surfaces safely.    Baseline pt. currently amb. with RW.    Time 4    Period Weeks    Status New    Target Date 01/26/20      PT LONG TERM GOAL #5   Title Pt. able to ambulate with consistent recip. pattern outside on grassy terrain with no issues to improve mobility at home.    Baseline TBD    Time 4    Period Weeks    Status New    Target Date 01/26/20      PT LONG TERM GOAL #6   Title Pt. able to ascend/ descend 10 stairs with recip. pattern and no UE assist to improve mobility.    Baseline handrails required.    Time 4    Period Weeks    Status New     Target Date 01/26/20                 Plan - 01/07/20 0813    Clinical Impression Statement Pt. demonstrates 101 deg. L knee flexion after  stretches/ ROM ex. but remains pain limited.  Pt. works hard during PT tx. session and ambulates with more consistent L hip/knee flexion while using SPC.  Pt. requires occasional cuing to flex L knee during gait, esp. during initial steps after standing from chair.  No Nustep at this time until staples removed next Monday.  Pt. will complete HEP 2x/day and use ice as needed to progress knee flexion/ quad strengthening over weekend.    Personal Factors and Comorbidities Profession;Past/Current Experience;Time since onset of injury/illness/exacerbation;Fitness;Comorbidity 3+;Behavior Pattern;Age    Comorbidities HTN, HLD, OA, sleep apnea, vertigo    Examination-Activity Limitations Squat;Lift;Stairs;Stand;Locomotion Level;Sit;Transfers;Sleep;Bend;Bed Mobility    Stability/Clinical Decision Making Evolving/Moderate complexity    Clinical Decision Making Moderate    Rehab Potential Good    PT Frequency 2x / week    PT Duration 4 weeks    PT Treatment/Interventions ADLs/Self Care Home Management;Cryotherapy;Electrical Stimulation;Moist Heat;Stair training;Gait training;Therapeutic activities;Functional mobility training;Therapeutic exercise;Balance training;Orthotic Fit/Training;Patient/family education;Neuromuscular re-education;Manual techniques;Dry needling;Passive range of motion;Scar mobilization;Taping;Joint Manipulations    PT Next Visit Plan Progress L knee ROM/ strengthening.    PT Home Exercise Plan [id number]    Consulted and Agree with Plan of Care Patient           Patient will benefit from skilled therapeutic intervention in order to improve the following deficits and impairments:  Abnormal gait, Decreased balance, Decreased endurance, Decreased mobility, Difficulty walking, Pain, Postural dysfunction, Decreased strength, Decreased activity  tolerance, Decreased range of motion, Improper body mechanics, Impaired flexibility, Hypomobility  Visit Diagnosis: S/P TKR (total knee replacement), left  Difficulty in walking, not elsewhere classified  Joint stiffness of knee, left  Muscle weakness (generalized)  Acute pain of left knee     Problem List Patient Active Problem List   Diagnosis Date Noted  . S/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, left 12/23/2019  . S/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, right 05/20/2019  . H/O total vaginal hysterectomy 03/12/2017  . Essential hypertension 03/08/2017  . Mixed dyslipidemia 03/08/2017  . Abnormal EKG 03/08/2017  . Preoperative cardiovascular examination 03/08/2017  . Allergic state 12/11/2013  . High cholesterol 12/11/2013  . History of depression 12/11/2013  . Hypothyroidism 12/11/2013  . Sleep apnea 12/11/2013   Pura Spice, PT, DPT # [id number] 01/07/2020, 2:39 PM  [facility] [address] [place], [place], [address] Phone: [phone]   Fax:  [phone]  Name: KANYA POTTEIGER MRN: [id number] Date of Birth: October 01, 1950

## 2020-01-12 ENCOUNTER — Encounter: Payer: Self-pay | Admitting: Physical Therapy

## 2020-01-12 ENCOUNTER — Ambulatory Visit: Payer: Medicare Other | Admitting: Physical Therapy

## 2020-01-12 ENCOUNTER — Other Ambulatory Visit: Payer: Self-pay

## 2020-01-12 DIAGNOSIS — M25662 Stiffness of left knee, not elsewhere classified: Secondary | ICD-10-CM

## 2020-01-12 DIAGNOSIS — M6281 Muscle weakness (generalized): Secondary | ICD-10-CM

## 2020-01-12 DIAGNOSIS — Z96652 Presence of left artificial knee joint: Secondary | ICD-10-CM | POA: Diagnosis not present

## 2020-01-12 DIAGNOSIS — R262 Difficulty in walking, not elsewhere classified: Secondary | ICD-10-CM

## 2020-01-12 DIAGNOSIS — M25562 Pain in left knee: Secondary | ICD-10-CM

## 2020-01-12 NOTE — Therapy (Signed)
[facility] [address] [place], [place], [address] Phone: [phone]   Fax:  [phone]  Physical Therapy Treatment  Patient Details  Name: Jordan Palmer MRN: [id number] Date of Birth: 02-03-1950 Referring Provider (PT): Dr. Mack Guise   Encounter Date: 01/12/2020   PT End of Session - 01/12/20 0820    Visit Number 5    Number of Visits 9    Date for PT Re-Evaluation 01/26/20    Authorization - Visit Number 5    Authorization - Number of Visits 10    PT Start Time 0802    PT Stop Time 0851    PT Time Calculation (min) 49 min    Activity Tolerance Patient tolerated treatment well;Patient limited by pain    Behavior During Therapy [facility] for tasks assessed/performed           Past Medical History:  Diagnosis Date   Arthritis    Hyperlipemia    Hypertension    Hypothyroidism    Sleep apnea    Vertigo     Past Surgical History:  Procedure Laterality Date   ANTERIOR AND POSTERIOR REPAIR N/A 03/12/2017   Procedure: ANTERIOR (CYSTOCELE) AND POSTERIOR REPAIR (RECTOCELE);  Surgeon: Salvadore Dom, MD;  Location: [facility];  Service: Gynecology;  Laterality: N/A;   CARPAL TUNNEL RELEASE Bilateral    CHOLECYSTECTOMY     CYSTOSCOPY N/A 03/12/2017   Procedure: CYSTOSCOPY;  Surgeon: Salvadore Dom, MD;  [facility];  Service: Gynecology;  Laterality: N/A;   EYE SURGERY     KNEE ARTHROSCOPY Left    KNEE ARTHROSCOPY WITH MEDIAL MENISECTOMY Right 02/13/2019   Procedure: KNEE ARTHROSCOPY WITH MEDIAL MENISECTOMY;  Surgeon: Thornton Park, MD;  Location: [facility];  Service: Orthopedics;  Laterality: Right;   LACRIMAL DUCT PROBING W/ DACRYOPLASTY Bilateral    SALPINGOOPHORECTOMY Left 03/12/2017   Procedure: SALPINGECTOMY;  Surgeon: Salvadore Dom, MD;  Location: [facility];  Service: Gynecology;  Laterality: Left;   SHOULDER ARTHROSCOPY WITH  OPEN ROTATOR CUFF REPAIR Right 09/03/2014   Procedure: SHOULDER ARTHROSCOPY WITH OPEN ROTATOR CUFF REPAIR;  Surgeon: Thornton Park, MD;  Location: [facility];  Service: Orthopedics;  Laterality: Right;   THYROIDECTOMY Right    hemi-thyroidectomy   TOTAL KNEE ARTHROPLASTY Right 05/20/2019   Procedure: TOTAL KNEE ARTHROPLASTY;  Surgeon: Thornton Park, MD;  Location: [facility];  Service: Orthopedics;  Laterality: Right;   TOTAL KNEE ARTHROPLASTY Left 12/23/2019   Procedure: Left TOTAL KNEE ARTHROPLASTY;  Surgeon: Thornton Park, MD;  Location: [facility];  Service: Orthopedics;  Laterality: Left;   VAGINAL HYSTERECTOMY N/A 03/12/2017   Procedure: HYSTERECTOMY VAGINAL;  Surgeon: Salvadore Dom, MD;  Location: [facility];  Service: Gynecology;  Laterality: N/A;    There were no vitals filed for this visit.   Subjective Assessment - 01/12/20 0817    Subjective Pt. states she is doing well.  Pt. reports a 6/10 L knee pain prior to tx. session.  Pt. states she is sleeping well on the recliner.    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  Pt. received PT prior to/ after knee scope this year.  See PT notes.  Pt. known well to PT clinic after recent R TKA.    Limitations Standing;Walking;House hold activities    Patient Stated Goals Increase L knee ROM/ strength/ improve pain-free walking.    Currently in Pain? Yes  Pain Score 6     Pain Location Knee    Pain Orientation Left            L knee AROM flexion: 105 deg. L knee PROM flexion (in supine): 108 deg.       There.ex.:  TM 1.6 mph 10 min.  Light B UE assist for balance/ safety.    Recip. Stairs with 1 handrail/  6" step ups 10x with progression to no UE assist.    High marching/ lateral walking in //-bars with increase knee flexion 8x each.  Pharmacist, hospital.  Supine heel slides/ quad sets (manual feedback/ holds)/ knee to chest 10x each.    Reviewed HEP   Manual  tx.:  Supine L patella mobs. (all planes)  Supine L knee AA/PROM: flexion with holds (pain limited)- 30 sec. Holds.   STM to L quad/ prox, gastroc. During stretching.       PT Long Term Goals - 12/29/19 0920      PT LONG TERM GOAL #1   Title Pt. will increase FOTO to 54 to improve functional mobility.    Baseline initial FOTO: 18.    Time 4    Period Weeks    Status New    Target Date 01/26/20      PT LONG TERM GOAL #2   Title Pt. will increase L knee AROM (0 to >115 deg.) to improve independence with gait.    Baseline L knee AROM:  -7 to 84 deg.    Time 4    Period Weeks    Status New    Target Date 01/26/20      PT LONG TERM GOAL #3   Title Patient will demonstrate improved strength of BLE as evidenced by 1/2 increase in MMT for increased function at home and in the community.    Baseline L hip flexion/ abduction strength 4/5 MMT, L quad 3/5 MMT, L hamstring 3/5 MMT.    Time 4    Period Weeks    Status New    Target Date 01/26/20      PT LONG TERM GOAL #4   Title Pt. able to ambulate with consistent 2-point gait pattern and use of SPC on level surfaces safely.    Baseline pt. currently amb. with RW.    Time 4    Period Weeks    Status New    Target Date 01/26/20      PT LONG TERM GOAL #5   Title Pt. able to ambulate with consistent recip. pattern outside on grassy terrain with no issues to improve mobility at home.    Baseline TBD    Time 4    Period Weeks    Status New    Target Date 01/26/20      PT LONG TERM GOAL #6   Title Pt. able to ascend/ descend 10 stairs with recip. pattern and no UE assist to improve mobility.    Baseline handrails required.    Time 4    Period Weeks    Status New    Target Date 01/26/20                 Plan - 01/12/20 0820    Clinical Impression Statement Pt. progressing to ambulating without use of SPC and consistent gait pattern/ heel strike.  Pt. will start to wean off the [facility] over the next week and returns  to MD today to have staples removed.  Pt. continues to increase L knee  flexion but remains pain limited, esp. with PROM.  No UE assist with L LE step ups and good quad control.   No change to HEP    Personal Factors and Comorbidities Profession;Past/Current Experience;Time since onset of injury/illness/exacerbation;Fitness;Comorbidity 3+;Behavior Pattern;Age    Comorbidities HTN, HLD, OA, sleep apnea, vertigo    Examination-Activity Limitations Squat;Lift;Stairs;Stand;Locomotion Level;Sit;Transfers;Sleep;Bend;Bed Mobility    Stability/Clinical Decision Making Evolving/Moderate complexity    Clinical Decision Making Moderate    Rehab Potential Good    PT Frequency 2x / week    PT Duration 4 weeks    PT Treatment/Interventions ADLs/Self Care Home Management;Cryotherapy;Electrical Stimulation;Moist Heat;Stair training;Gait training;Therapeutic activities;Functional mobility training;Therapeutic exercise;Balance training;Orthotic Fit/Training;Patient/family education;Neuromuscular re-education;Manual techniques;Dry needling;Passive range of motion;Scar mobilization;Taping;Joint Manipulations    PT Next Visit Plan Progress L knee ROM/ strengthening.  Check incision next tx. session    PT Home Exercise Plan [id number]    Consulted and Agree with Plan of Care Patient           Patient will benefit from skilled therapeutic intervention in order to improve the following deficits and impairments:  Abnormal gait,Decreased balance,Decreased endurance,Decreased mobility,Difficulty walking,Pain,Postural dysfunction,Decreased strength,Decreased activity tolerance,Decreased range of motion,Improper body mechanics,Impaired flexibility,Hypomobility  Visit Diagnosis: S/P TKR (total knee replacement), left  Difficulty in walking, not elsewhere classified  Joint stiffness of knee, left  Muscle weakness (generalized)  Acute pain of left knee     Problem List Patient Active Problem List   Diagnosis Date  Noted   S/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, left 12/23/2019   S/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, right 05/20/2019   H/O total vaginal hysterectomy 03/12/2017   Essential hypertension 03/08/2017   Mixed dyslipidemia 03/08/2017   Abnormal EKG 03/08/2017   Preoperative cardiovascular examination 03/08/2017   Allergic state 12/11/2013   High cholesterol 12/11/2013   History of depression 12/11/2013   Hypothyroidism 12/11/2013   Sleep apnea 12/11/2013   Pura Spice, PT, DPT # [phone] 01/12/2020, 12:40 PM  [facility] [address] [place], [place], [address] Phone: [phone]   Fax:  [phone]  Name: Jordan Palmer MRN: [id number] Date of Birth: 04-09-1950

## 2020-01-14 ENCOUNTER — Other Ambulatory Visit: Payer: Self-pay

## 2020-01-14 ENCOUNTER — Ambulatory Visit: Payer: Medicare Other | Admitting: Physical Therapy

## 2020-01-14 ENCOUNTER — Encounter: Payer: Self-pay | Admitting: Physical Therapy

## 2020-01-14 ENCOUNTER — Ambulatory Visit: Payer: Medicare Other | Admitting: Obstetrics and Gynecology

## 2020-01-14 DIAGNOSIS — M6281 Muscle weakness (generalized): Secondary | ICD-10-CM

## 2020-01-14 DIAGNOSIS — Z96652 Presence of left artificial knee joint: Secondary | ICD-10-CM | POA: Diagnosis not present

## 2020-01-14 DIAGNOSIS — R262 Difficulty in walking, not elsewhere classified: Secondary | ICD-10-CM

## 2020-01-14 DIAGNOSIS — M25662 Stiffness of left knee, not elsewhere classified: Secondary | ICD-10-CM

## 2020-01-14 DIAGNOSIS — M25562 Pain in left knee: Secondary | ICD-10-CM

## 2020-01-14 NOTE — Therapy (Signed)
[facility] [address]. [place], [place], [address] Phone: [phone]   Fax:  [phone]  Physical Therapy Treatment  Patient Details  Name: Jordan Palmer MRN: [id number] Date of Birth: [birth year] Referring Provider (PT): Dr. Mack Guise   Encounter Date: 01/14/2020   PT End of Session - 01/14/20 0828    Visit Number 6    Number of Visits 9    Date for PT Re-Evaluation 01/26/20    Authorization - Visit Number 6    Authorization - Number of Visits 10    PT Start Time 0812    PT Stop Time 0903    PT Time Calculation (min) 51 min    Activity Tolerance Patient tolerated treatment well;Patient limited by pain    Behavior During Therapy [facility] for tasks assessed/performed           Past Medical History:  Diagnosis Date  . Arthritis   . Hyperlipemia   . Hypertension   . Hypothyroidism   . Sleep apnea   . Vertigo     Past Surgical History:  Procedure Laterality Date  . ANTERIOR AND POSTERIOR REPAIR N/A 03/12/2017   Procedure: ANTERIOR (CYSTOCELE) AND POSTERIOR REPAIR (RECTOCELE);  Surgeon: Salvadore Dom, MD;  Location: [facility];  Service: Gynecology;  Laterality: N/A;  . CARPAL TUNNEL RELEASE Bilateral   . CHOLECYSTECTOMY    . CYSTOSCOPY N/A 03/12/2017   Procedure: CYSTOSCOPY;  Surgeon: Salvadore Dom, MD;  Location: [facility];  Service: Gynecology;  Laterality: N/A;  . EYE SURGERY    . KNEE ARTHROSCOPY Left   . KNEE ARTHROSCOPY WITH MEDIAL MENISECTOMY Right 02/13/2019   Procedure: KNEE ARTHROSCOPY WITH MEDIAL MENISECTOMY;  Surgeon: Thornton Park, MD;  Location: [facility];  Service: Orthopedics;  Laterality: Right;  . LACRIMAL DUCT PROBING W/ DACRYOPLASTY Bilateral   . SALPINGOOPHORECTOMY Left 03/12/2017   Procedure: SALPINGECTOMY;  Surgeon: Salvadore Dom, MD;  Location[facility];  Service: Gynecology;  Laterality: Left;  . SHOULDER ARTHROSCOPY WITH  OPEN ROTATOR CUFF REPAIR Right 09/03/2014   Procedure: SHOULDER ARTHROSCOPY WITH OPEN ROTATOR CUFF REPAIR;  Surgeon: Thornton Park, MD;  Location: [facility];  Service: Orthopedics;  Laterality: Right;  . THYROIDECTOMY Right    hemi-thyroidectomy  . TOTAL KNEE ARTHROPLASTY Right 05/20/2019   Procedure: TOTAL KNEE ARTHROPLASTY;  Surgeon: Thornton Park, MD;  Location: [facility];  Service: Orthopedics;  Laterality: Right;  . TOTAL KNEE ARTHROPLASTY Left 12/23/2019   Procedure: Left TOTAL KNEE ARTHROPLASTY;  Surgeon: Thornton Park, MD;  Location: [facility];  Service: Orthopedics;  Laterality: Left;  Marland Kitchen VAGINAL HYSTERECTOMY N/A 03/12/2017   Procedure: HYSTERECTOMY VAGINAL;  Surgeon: Salvadore Dom, MD;  Location: [facility];  Service: Gynecology;  Laterality: N/A;    There were no vitals filed for this visit.   Subjective Assessment - 01/14/20 0827    Subjective Pt. states MD is happy with L knee progress and incision healing.  Pt. has steristrips in place and returns to MD in 4 weeks.    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  Pt. received PT prior to/ after knee scope this year.  See PT notes.  Pt. known well to PT clinic after recent R TKA.    Limitations Standing;Walking;House hold activities    Patient Stated Goals Increase L knee ROM/ strength/ improve pain-free walking.    Currently in Pain? Yes    Pain  Score 6     Pain Location Knee    Pain Descriptors / Indicators Aching             L knee AROM flexion: 108 deg.      There.ex.:  Nustep L3 12 min. B UE/LE  Walking in //-bars working on consistent gait pattern without UE assist.   Good heel strike/ cuing to increase cadence.  High marching/ lateral walking in //-bars with increase knee flexion 8x each.Pharmacist, hospital.  TM 1.6 mph 10 min.  Light B UE assist for balance/ safety.    2nd step knee flexion/ extension with static holds (handrails for support).     Supine heel slides/ quad sets (manual feedback/ holds)/ knee to chest 10x each.   Walking cone taps at agility ladder working on consistent hip flexion/ control 10x2.     L knee AROM flexion 108 deg. After manual stretches.      PT Long Term Goals - 12/29/19 0920      PT LONG TERM GOAL #1   Title Pt. will increase FOTO to 54 to improve functional mobility.    Baseline initial FOTO: 18.    Time 4    Period Weeks    Status New    Target Date 01/26/20      PT LONG TERM GOAL #2   Title Pt. will increase L knee AROM (0 to >115 deg.) to improve independence with gait.    Baseline L knee AROM:  -7 to 84 deg.    Time 4    Period Weeks    Status New    Target Date 01/26/20      PT LONG TERM GOAL #3   Title Patient will demonstrate improved strength of BLE as evidenced by 1/2 increase in MMT for increased function at home and in the community.    Baseline L hip flexion/ abduction strength 4/5 MMT, L quad 3/5 MMT, L hamstring 3/5 MMT.    Time 4    Period Weeks    Status New    Target Date 01/26/20      PT LONG TERM GOAL #4   Title Pt. able to ambulate with consistent 2-point gait pattern and use of SPC on level surfaces safely.    Baseline pt. currently amb. with RW.    Time 4    Period Weeks    Status New    Target Date 01/26/20      PT LONG TERM GOAL #5   Title Pt. able to ambulate with consistent recip. pattern outside on grassy terrain with no issues to improve mobility at home.    Baseline TBD    Time 4    Period Weeks    Status New    Target Date 01/26/20      PT LONG TERM GOAL #6   Title Pt. able to ascend/ descend 10 stairs with recip. pattern and no UE assist to improve mobility.    Baseline handrails required.    Time 4    Period Weeks    Status New    Target Date 01/26/20              Plan - 01/14/20 1004    Clinical Impression Statement Pt. ambulating around PT clinic carrying [facility] and walking with more normalzied gait pattern.  Pt. continues to  wean off the [place] inside and occasionally using with outside gait.  No LOB during tx. session and progressing with L knee AROM flexion 108 deg. (  pain limited).  Pt. has continued L distal hamstring discomfort with full knee extension static holds during patellar mobs.  Good incision healing with light scabbing noted and steristrips in place.    Personal Factors and Comorbidities Profession;Past/Current Experience;Time since onset of injury/illness/exacerbation;Fitness;Comorbidity 3+;Behavior Pattern;Age    Comorbidities HTN, HLD, OA, sleep apnea, vertigo    Examination-Activity Limitations Squat;Lift;Stairs;Stand;Locomotion Level;Sit;Transfers;Sleep;Bend;Bed Mobility    Stability/Clinical Decision Making Evolving/Moderate complexity    Clinical Decision Making Moderate    Rehab Potential Good    PT Frequency 2x / week    PT Duration 4 weeks    PT Treatment/Interventions ADLs/Self Care Home Management;Cryotherapy;Electrical Stimulation;Moist Heat;Stair training;Gait training;Therapeutic activities;Functional mobility training;Therapeutic exercise;Balance training;Orthotic Fit/Training;Patient/family education;Neuromuscular re-education;Manual techniques;Dry needling;Passive range of motion;Scar mobilization;Taping;Joint Manipulations    PT Next Visit Plan Progress L knee ROM/ strengthening.  Check incision next tx. session    PT Home Exercise Plan [id number]    Consulted and Agree with Plan of Care Patient           Patient will benefit from skilled therapeutic intervention in order to improve the following deficits and impairments:  Abnormal gait,Decreased balance,Decreased endurance,Decreased mobility,Difficulty walking,Pain,Postural dysfunction,Decreased strength,Decreased activity tolerance,Decreased range of motion,Improper body mechanics,Impaired flexibility,Hypomobility  Visit Diagnosis: S/P TKR (total knee replacement), left  Difficulty in walking, not elsewhere classified  Joint  stiffness of knee, left  Muscle weakness (generalized)  Acute pain of left knee     Problem List Patient Active Problem List   Diagnosis Date Noted  . S/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, left 12/23/2019  . S/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, right 05/20/2019  . H/O total vaginal hysterectomy 03/12/2017  . Essential hypertension 03/08/2017  . Mixed dyslipidemia 03/08/2017  . Abnormal EKG 03/08/2017  . Preoperative cardiovascular examination 03/08/2017  . Allergic state 12/11/2013  . High cholesterol 12/11/2013  . History of depression 12/11/2013  . Hypothyroidism 12/11/2013  . Sleep apnea 12/11/2013   Pura Spice, PT, DPT # [phone] 01/14/2020, 10:07 AM  [place] [facility] [address] [place], [place], [address] Phone: [phone]   Fax:  [phone]  Name: Jordan Palmer MRN: [id number] Date of Birth: 02-May-1950

## 2020-01-19 ENCOUNTER — Other Ambulatory Visit: Payer: Self-pay

## 2020-01-19 ENCOUNTER — Ambulatory Visit: Payer: Medicare Other | Admitting: Physical Therapy

## 2020-01-19 ENCOUNTER — Encounter: Payer: Self-pay | Admitting: Physical Therapy

## 2020-01-19 DIAGNOSIS — R262 Difficulty in walking, not elsewhere classified: Secondary | ICD-10-CM

## 2020-01-19 DIAGNOSIS — M25562 Pain in left knee: Secondary | ICD-10-CM

## 2020-01-19 DIAGNOSIS — M25662 Stiffness of left knee, not elsewhere classified: Secondary | ICD-10-CM

## 2020-01-19 DIAGNOSIS — M6281 Muscle weakness (generalized): Secondary | ICD-10-CM

## 2020-01-19 DIAGNOSIS — Z96652 Presence of left artificial knee joint: Secondary | ICD-10-CM

## 2020-01-19 NOTE — Therapy (Signed)
[place] [facility] [address]. [place], [place], [address] Phone: [phone]   Fax:  [phone]  Physical Therapy Treatment  Patient Details  Name: Jordan Palmer MRN: [id number] Date of Birth: 03-29-1950 Referring Provider (PT): Dr. Mack Guise   Encounter Date: 01/19/2020   PT End of Session - 01/19/20 0811    Visit Number 7    Number of Visits 9    Date for PT Re-Evaluation 01/26/20    Authorization - Visit Number 7    Authorization - Number of Visits 10    PT Start Time 0803    PT Stop Time 6384    PT Time Calculation (min) 55 min    Activity Tolerance Patient tolerated treatment well;Patient limited by pain    Behavior During Therapy [facility] for tasks assessed/performed           Past Medical History:  Diagnosis Date  . Arthritis   . Hyperlipemia   . Hypertension   . Hypothyroidism   . Sleep apnea   . Vertigo     Past Surgical History:  Procedure Laterality Date  . ANTERIOR AND POSTERIOR REPAIR N/A 03/12/2017   Procedure: ANTERIOR (CYSTOCELE) AND POSTERIOR REPAIR (RECTOCELE);  Surgeon: Salvadore Dom, MD;  Location: [facility];  Service: Gynecology;  Laterality: N/A;  . CARPAL TUNNEL RELEASE Bilateral   . CHOLECYSTECTOMY    . CYSTOSCOPY N/A 03/12/2017   Procedure: CYSTOSCOPY;  Surgeon: Salvadore Dom, MD;  Location: [facility];  Service: Gynecology;  Laterality: N/A;  . EYE SURGERY    . KNEE ARTHROSCOPY Left   . KNEE ARTHROSCOPY WITH MEDIAL MENISECTOMY Right 02/13/2019   Procedure: KNEE ARTHROSCOPY WITH MEDIAL MENISECTOMY;  Surgeon: Thornton Park, MD;  Location: [facility];  Service: Orthopedics;  Laterality: Right;  . LACRIMAL DUCT PROBING W/ DACRYOPLASTY Bilateral   . SALPINGOOPHORECTOMY Left 03/12/2017   Procedure: SALPINGECTOMY;  Surgeon: Salvadore Dom, MD;  Location: [facility];  Service: Gynecology;  Laterality: Left;  . SHOULDER ARTHROSCOPY WITH  OPEN ROTATOR CUFF REPAIR Right 09/03/2014   Procedure: SHOULDER ARTHROSCOPY WITH OPEN ROTATOR CUFF REPAIR;  Surgeon: Thornton Park, MD;  Location: [facility];  Service: Orthopedics;  Laterality: Right;  . THYROIDECTOMY Right    hemi-thyroidectomy  . TOTAL KNEE ARTHROPLASTY Right 05/20/2019   Procedure: TOTAL KNEE ARTHROPLASTY;  Surgeon: Thornton Park, MD;  Location: [facility];  Service: Orthopedics;  Laterality: Right;  . TOTAL KNEE ARTHROPLASTY Left 12/23/2019   Procedure: Left TOTAL KNEE ARTHROPLASTY;  Surgeon: Thornton Park, MD;  Location: [facility];  Service: Orthopedics;  Laterality: Left;  Marland Kitchen VAGINAL HYSTERECTOMY N/A 03/12/2017   Procedure: HYSTERECTOMY VAGINAL;  Surgeon: Salvadore Dom, MD;  Location: [facility];  Service: Gynecology;  Laterality: N/A;    There were no vitals filed for this visit.   Subjective Assessment - 01/19/20 0809    Subjective Pt. reports no new complaints.  Pt. reports 5/10 L knee pain this morning.  Pt. continues to take pain medication.    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  Pt. received PT prior to/ after knee scope this year.  See PT notes.  Pt. known well to PT clinic after recent R TKA.    Limitations Standing;Walking;House hold activities    Patient Stated Goals Increase L knee ROM/ strength/ improve pain-free walking.    Currently in Pain? Yes    Pain Score 5  Pain Location Knee    Pain Orientation Left    Pain Descriptors / Indicators Aching             L knee AROM flexion: 111 deg.     There.ex.:  Nustep L4 12 min. B UE/LE (seat 5-3).   Walking in //-bars working on consistent gait pattern without UE assist.   Good heel strike/ cuing to increase cadence.  Forward/ backwards/ lateral walking with mirror feedback 10x each.    2nd step knee flexion/ extension with static holds (handrails for support).    Supine heel slides/ quad sets (manual feedback/ holds)/ knee to  chest 10x each.   Manual tx:  Supine L knee flexion/ extension AAROM 5x with static holds.  Assessment of L distal hamstring musculature.  Good incision healing (steristrips in place).  STM to L distal quad/ hamstring 5 min.         PT Long Term Goals - 12/29/19 0920      PT LONG TERM GOAL #1   Title Pt. will increase FOTO to 54 to improve functional mobility.    Baseline initial FOTO: 18.    Time 4    Period Weeks    Status New    Target Date 01/26/20      PT LONG TERM GOAL #2   Title Pt. will increase L knee AROM (0 to >115 deg.) to improve independence with gait.    Baseline L knee AROM:  -7 to 84 deg.    Time 4    Period Weeks    Status New    Target Date 01/26/20      PT LONG TERM GOAL #3   Title Patient will demonstrate improved strength of BLE as evidenced by 1/2 increase in MMT for increased function at home and in the community.    Baseline L hip flexion/ abduction strength 4/5 MMT, L quad 3/5 MMT, L hamstring 3/5 MMT.    Time 4    Period Weeks    Status New    Target Date 01/26/20      PT LONG TERM GOAL #4   Title Pt. able to ambulate with consistent 2-point gait pattern and use of SPC on level surfaces safely.    Baseline pt. currently amb. with RW.    Time 4    Period Weeks    Status New    Target Date 01/26/20      PT LONG TERM GOAL #5   Title Pt. able to ambulate with consistent recip. pattern outside on grassy terrain with no issues to improve mobility at home.    Baseline TBD    Time 4    Period Weeks    Status New    Target Date 01/26/20      PT LONG TERM GOAL #6   Title Pt. able to ascend/ descend 10 stairs with recip. pattern and no UE assist to improve mobility.    Baseline handrails required.    Time 4    Period Weeks    Status New    Target Date 01/26/20              Plan - 01/19/20 1941    Clinical Impression Statement Pt. had increase c/o L distal hamstring/ posterior knee pain with full knee extension and heel strike  while walking around PT clinic.  L knee flexion AAROM in supine: 111 deg. (increase pain reported).  Pt. benefited from use of SPC today due to new increase in  L posterior knee pain.  PT recommends gentle hamstring/ proximal gastroc stretches and STM/ ice.  Pt. will continue with HEP and will attempt to sleep in bed prior to next tx. session.    Personal Factors and Comorbidities Profession;Past/Current Experience;Time since onset of injury/illness/exacerbation;Fitness;Comorbidity 3+;Behavior Pattern;Age    Comorbidities HTN, HLD, OA, sleep apnea, vertigo    Examination-Activity Limitations Squat;Lift;Stairs;Stand;Locomotion Level;Sit;Transfers;Sleep;Bend;Bed Mobility    Stability/Clinical Decision Making Evolving/Moderate complexity    Clinical Decision Making Moderate    Rehab Potential Good    PT Frequency 2x / week    PT Duration 4 weeks    PT Treatment/Interventions ADLs/Self Care Home Management;Cryotherapy;Electrical Stimulation;Moist Heat;Stair training;Gait training;Therapeutic activities;Functional mobility training;Therapeutic exercise;Balance training;Orthotic Fit/Training;Patient/family education;Neuromuscular re-education;Manual techniques;Dry needling;Passive range of motion;Scar mobilization;Taping;Joint Manipulations    PT Next Visit Plan Progress L knee ROM/ strengthening.    PT Home Exercise Plan [id number]    Consulted and Agree with Plan of Care Patient           Patient will benefit from skilled therapeutic intervention in order to improve the following deficits and impairments:  Abnormal gait,Decreased balance,Decreased endurance,Decreased mobility,Difficulty walking,Pain,Postural dysfunction,Decreased strength,Decreased activity tolerance,Decreased range of motion,Improper body mechanics,Impaired flexibility,Hypomobility  Visit Diagnosis: S/P TKR (total knee replacement), left  Difficulty in walking, not elsewhere classified  Joint stiffness of knee, left  Muscle  weakness (generalized)  Acute pain of left knee     Problem List Patient Active Problem List   Diagnosis Date Noted  . S/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, left 12/23/2019  . S/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, right 05/20/2019  . H/O total vaginal hysterectomy 03/12/2017  . Essential hypertension 03/08/2017  . Mixed dyslipidemia 03/08/2017  . Abnormal EKG 03/08/2017  . Preoperative cardiovascular examination 03/08/2017  . Allergic state 12/11/2013  . High cholesterol 12/11/2013  . History of depression 12/11/2013  . Hypothyroidism 12/11/2013  . Sleep apnea 12/11/2013   Pura Spice, PT, DPT # [phone] 01/19/2020, 10:33 AM  [facility] [address] [place], [place], [address] Phone: [phone]   Fax:  [phone]  Name: MIKEYLA MUSIC MRN: [id number] Date of Birth: [birth year]

## 2020-01-21 ENCOUNTER — Ambulatory Visit: Payer: Medicare Other | Admitting: Physical Therapy

## 2020-01-21 ENCOUNTER — Other Ambulatory Visit: Payer: Self-pay

## 2020-01-21 ENCOUNTER — Encounter: Payer: Self-pay | Admitting: Physical Therapy

## 2020-01-21 DIAGNOSIS — M25562 Pain in left knee: Secondary | ICD-10-CM

## 2020-01-21 DIAGNOSIS — R262 Difficulty in walking, not elsewhere classified: Secondary | ICD-10-CM

## 2020-01-21 DIAGNOSIS — Z96652 Presence of left artificial knee joint: Secondary | ICD-10-CM | POA: Diagnosis not present

## 2020-01-21 DIAGNOSIS — M6281 Muscle weakness (generalized): Secondary | ICD-10-CM

## 2020-01-21 DIAGNOSIS — M25662 Stiffness of left knee, not elsewhere classified: Secondary | ICD-10-CM

## 2020-01-21 NOTE — Therapy (Signed)
[facility] [address] [place], [place], [address] Phone: [phone]   Fax:  [phone]  Physical Therapy Treatment  Patient Details  Name: Jordan Palmer MRN: [id number] Date of Birth: 1950/06/13 Referring Provider (PT): Dr. Mack Guise   Encounter Date: 01/21/2020   PT End of Session - 01/21/20 0728    Visit Number 8    Number of Visits 9    Date for PT Re-Evaluation 01/26/20    Authorization - Visit Number 8    Authorization - Number of Visits 10    PT Start Time 0806    PT Stop Time 0901    PT Time Calculation (min) 55 min    Activity Tolerance Patient tolerated treatment well;Patient limited by pain    Behavior During Therapy [facility] for tasks assessed/performed           Past Medical History:  Diagnosis Date  . Arthritis   . Hyperlipemia   . Hypertension   . Hypothyroidism   . Sleep apnea   . Vertigo     Past Surgical History:  Procedure Laterality Date  . ANTERIOR AND POSTERIOR REPAIR N/A 03/12/2017   Procedure: ANTERIOR (CYSTOCELE) AND POSTERIOR REPAIR (RECTOCELE);  Surgeon: Salvadore Dom, MD;  Location: [facility];  Service: Gynecology;  Laterality: N/A;  . CARPAL TUNNEL RELEASE Bilateral   . CHOLECYSTECTOMY    . CYSTOSCOPY N/A 03/12/2017   Procedure: CYSTOSCOPY;  Surgeon: Salvadore Dom, MD;  Location: [facility];  Service: Gynecology;  Laterality: N/A;  . EYE SURGERY    . KNEE ARTHROSCOPY Left   . KNEE ARTHROSCOPY WITH MEDIAL MENISECTOMY Right 02/13/2019   Procedure: KNEE ARTHROSCOPY WITH MEDIAL MENISECTOMY;  Surgeon: Thornton Park, MD;  Location: [facility];  Service: Orthopedics;  Laterality: Right;  . LACRIMAL DUCT PROBING W/ DACRYOPLASTY Bilateral   . SALPINGOOPHORECTOMY Left 03/12/2017   Procedure: SALPINGECTOMY;  Surgeon: Salvadore Dom, MD;  Location: [facility];  Service: Gynecology;  Laterality: Left;  . SHOULDER ARTHROSCOPY WITH  OPEN ROTATOR CUFF REPAIR Right 09/03/2014   Procedure: SHOULDER ARTHROSCOPY WITH OPEN ROTATOR CUFF REPAIR;  Surgeon: Thornton Park, MD;  Location: [facility];  Service: Orthopedics;  Laterality: Right;  . THYROIDECTOMY Right    hemi-thyroidectomy  . TOTAL KNEE ARTHROPLASTY Right 05/20/2019   Procedure: TOTAL KNEE ARTHROPLASTY;  Surgeon: Thornton Park, MD;  Location: [facility];  Service: Orthopedics;  Laterality: Right;  . TOTAL KNEE ARTHROPLASTY Left 12/23/2019   Procedure: Left TOTAL KNEE ARTHROPLASTY;  Surgeon: Thornton Park, MD;  Location: [facility];  Service: Orthopedics;  Laterality: Left;  Marland Kitchen VAGINAL HYSTERECTOMY N/A 03/12/2017   Procedure: HYSTERECTOMY VAGINAL;  Surgeon: Salvadore Dom, MD;  Location: [facility];  Service: Gynecology;  Laterality: N/A;    There were no vitals filed for this visit.   Subjective Assessment - 01/21/20 0727    Subjective Pt. states L distal/ lateral hamstring has been sore and hurting since last tx. session.  Pt. has been icing/ taking pain meds/ using SPC and reports compliance with HEP.    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  Pt. received PT prior to/ after knee scope this year.  See PT notes.  Pt. known well to PT clinic after recent R TKA.    Limitations Standing;Walking;House hold activities    Patient Stated Goals Increase L knee ROM/ strength/ improve pain-free walking.    Currently in Pain?  Yes    Pain Score 5     Pain Location Knee    Pain Orientation Left             There.ex.:  Nustep L4 10 min. B UE/LE (seat 5-3).   Walking in //-bars working on consistent gait pattern without UE assist. Good heel strike/ cuing to increase cadence.  Forward/ backwards/ lateral walking with mirror feedback 10x each.    Supine heel slides/ quad sets (manual feedback/ holds)/ SLR/ R sidelying L hip abduction/ prone hip extension/ knee flexion 20x each  Ambulate in clinic/ hallway working  on consistent gait pattern without SPC and cuing to correct posture/ head position.    Manual tx:  Supine L knee flexion/ extension AAROM 5x with static holds.  STM to L distal quad/ hamstring 9 min.       PT Long Term Goals - 12/29/19 0920      PT LONG TERM GOAL #1   Title Pt. will increase FOTO to 54 to improve functional mobility.    Baseline initial FOTO: 18.    Time 4    Period Weeks    Status New    Target Date 01/26/20      PT LONG TERM GOAL #2   Title Pt. will increase L knee AROM (0 to >115 deg.) to improve independence with gait.    Baseline L knee AROM:  -7 to 84 deg.    Time 4    Period Weeks    Status New    Target Date 01/26/20      PT LONG TERM GOAL #3   Title Patient will demonstrate improved strength of BLE as evidenced by 1/2 increase in MMT for increased function at home and in the community.    Baseline L hip flexion/ abduction strength 4/5 MMT, L quad 3/5 MMT, L hamstring 3/5 MMT.    Time 4    Period Weeks    Status New    Target Date 01/26/20      PT LONG TERM GOAL #4   Title Pt. able to ambulate with consistent 2-point gait pattern and use of SPC on level surfaces safely.    Baseline pt. currently amb. with RW.    Time 4    Period Weeks    Status New    Target Date 01/26/20      PT LONG TERM GOAL #5   Title Pt. able to ambulate with consistent recip. pattern outside on grassy terrain with no issues to improve mobility at home.    Baseline TBD    Time 4    Period Weeks    Status New    Target Date 01/26/20      PT LONG TERM GOAL #6   Title Pt. able to ascend/ descend 10 stairs with recip. pattern and no UE assist to improve mobility.    Baseline handrails required.    Time 4    Period Weeks    Status New    Target Date 01/26/20                 Plan - 01/21/20 [id number]    Clinical Impression Statement Pt. ambulates with slight L antalgic gait pattern today due to increase c/o L distal hamstring pain during heel strike/  stance phase of gait.  (+) L distal/lateral hamstring tenderness with STM.  Pt. reports a decrease in pain after gentle stretching/ manual tx. to L knee in supine and prone position.  Good incision  healing with scabbing noted and several steristrips in place.    Personal Factors and Comorbidities Profession;Past/Current Experience;Time since onset of injury/illness/exacerbation;Fitness;Comorbidity 3+;Behavior Pattern;Age    Comorbidities HTN, HLD, OA, sleep apnea, vertigo    Examination-Activity Limitations Squat;Lift;Stairs;Stand;Locomotion Level;Sit;Transfers;Sleep;Bend;Bed Mobility    Stability/Clinical Decision Making Evolving/Moderate complexity    Clinical Decision Making Moderate    Rehab Potential Good    PT Frequency 2x / week    PT Duration 4 weeks    PT Treatment/Interventions ADLs/Self Care Home Management;Cryotherapy;Electrical Stimulation;Moist Heat;Stair training;Gait training;Therapeutic activities;Functional mobility training;Therapeutic exercise;Balance training;Orthotic Fit/Training;Patient/family education;Neuromuscular re-education;Manual techniques;Dry needling;Passive range of motion;Scar mobilization;Taping;Joint Manipulations    PT Next Visit Plan Progress L knee ROM/ strengthening.    PT Home Exercise Plan [id number]    Consulted and Agree with Plan of Care Patient           Patient will benefit from skilled therapeutic intervention in order to improve the following deficits and impairments:  Abnormal gait,Decreased balance,Decreased endurance,Decreased mobility,Difficulty walking,Pain,Postural dysfunction,Decreased strength,Decreased activity tolerance,Decreased range of motion,Improper body mechanics,Impaired flexibility,Hypomobility  Visit Diagnosis: S/P TKR (total knee replacement), left  Difficulty in walking, not elsewhere classified  Joint stiffness of knee, left  Muscle weakness (generalized)  Acute pain of left knee     Problem List Patient Active  Problem List   Diagnosis Date Noted  . S/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, left 12/23/2019  . S/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, right 05/20/2019  . H/O total vaginal hysterectomy 03/12/2017  . Essential hypertension 03/08/2017  . Mixed dyslipidemia 03/08/2017  . Abnormal EKG 03/08/2017  . Preoperative cardiovascular examination 03/08/2017  . Allergic state 12/11/2013  . High cholesterol 12/11/2013  . History of depression 12/11/2013  . Hypothyroidism 12/11/2013  . Sleep apnea 12/11/2013   Pura Spice, PT, DPT # [phone] 01/21/2020, 9:20 AM  [place] [facility] [address] [place], [place], [address] Phone: [phone]   Fax:  [phone]  Name: Jordan Palmer MRN: [id number] Date of Birth: [birth year]

## 2020-01-26 ENCOUNTER — Ambulatory Visit: Payer: Medicare Other | Admitting: Physical Therapy

## 2020-01-26 ENCOUNTER — Other Ambulatory Visit: Payer: Self-pay

## 2020-01-26 ENCOUNTER — Encounter: Payer: Self-pay | Admitting: Physical Therapy

## 2020-01-26 DIAGNOSIS — M6281 Muscle weakness (generalized): Secondary | ICD-10-CM

## 2020-01-26 DIAGNOSIS — M25562 Pain in left knee: Secondary | ICD-10-CM

## 2020-01-26 DIAGNOSIS — M25662 Stiffness of left knee, not elsewhere classified: Secondary | ICD-10-CM

## 2020-01-26 DIAGNOSIS — R262 Difficulty in walking, not elsewhere classified: Secondary | ICD-10-CM

## 2020-01-26 DIAGNOSIS — Z96652 Presence of left artificial knee joint: Secondary | ICD-10-CM

## 2020-01-26 NOTE — Therapy (Signed)
[facility] [address] [place], [place], [address] Phone: [phone]   Fax:  [phone]  Physical Therapy Treatment  Patient Details  Name: Jordan Palmer MRN: [id number] Date of Birth: 1951-01-18 Referring Provider (PT): Dr. Mack Guise   Encounter Date: 01/26/2020   PT End of Session - 01/26/20 0747    Visit Number 9    Number of Visits 9    Date for PT Re-Evaluation 01/26/20    Authorization - Visit Number 9    Authorization - Number of Visits 10    PT Start Time 0802    PT Stop Time 0902    PT Time Calculation (min) 60 min    Activity Tolerance Patient tolerated treatment well;Patient limited by pain    Behavior During Therapy [facility] for tasks assessed/performed           Past Medical History:  Diagnosis Date  . Arthritis   . Hyperlipemia   . Hypertension   . Hypothyroidism   . Sleep apnea   . Vertigo     Past Surgical History:  Procedure Laterality Date  . ANTERIOR AND POSTERIOR REPAIR N/A 03/12/2017   Procedure: ANTERIOR (CYSTOCELE) AND POSTERIOR REPAIR (RECTOCELE);  Surgeon: Salvadore Dom, MD;  Location: [facility];  Service: Gynecology;  Laterality: N/A;  . CARPAL TUNNEL RELEASE Bilateral   . CHOLECYSTECTOMY    . CYSTOSCOPY N/A 03/12/2017   Procedure: CYSTOSCOPY;  Surgeon: Salvadore Dom, MD;  Location: [facility];  Service: Gynecology;  Laterality: N/A;  . EYE SURGERY    . KNEE ARTHROSCOPY Left   . KNEE ARTHROSCOPY WITH MEDIAL MENISECTOMY Right 02/13/2019   Procedure: KNEE ARTHROSCOPY WITH MEDIAL MENISECTOMY;  Surgeon: Thornton Park, MD;  Location: [facility];  Service: Orthopedics;  Laterality: Right;  . LACRIMAL DUCT PROBING W/ DACRYOPLASTY Bilateral   . SALPINGOOPHORECTOMY Left 03/12/2017   Procedure: SALPINGECTOMY;  Surgeon: Salvadore Dom, MD;  Location: [facility];  Service: Gynecology;  Laterality: Left;  . SHOULDER ARTHROSCOPY WITH  OPEN ROTATOR CUFF REPAIR Right 09/03/2014   Procedure: SHOULDER ARTHROSCOPY WITH OPEN ROTATOR CUFF REPAIR;  Surgeon: Thornton Park, MD;  Location: [facility];  Service: Orthopedics;  Laterality: Right;  . THYROIDECTOMY Right    hemi-thyroidectomy  . TOTAL KNEE ARTHROPLASTY Right 05/20/2019   Procedure: TOTAL KNEE ARTHROPLASTY;  Surgeon: Thornton Park, MD;  Location: [facility];  Service: Orthopedics;  Laterality: Right;  . TOTAL KNEE ARTHROPLASTY Left 12/23/2019   Procedure: Left TOTAL KNEE ARTHROPLASTY;  Surgeon: Thornton Park, MD;  Location: [facility];  Service: Orthopedics;  Laterality: Left;  Marland Kitchen VAGINAL HYSTERECTOMY N/A 03/12/2017   Procedure: HYSTERECTOMY VAGINAL;  Surgeon: Salvadore Dom, MD;  Location: [facility];  Service: Gynecology;  Laterality: N/A;    There were no vitals filed for this visit.   Subjective Assessment - 01/26/20 0747    Subjective Pt. states hamstring is feeling better but L lateral knee pain (5-6/10).  Pt. reports having a good weekend.    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  Pt. received PT prior to/ after knee scope this year.  See PT notes.  Pt. known well to PT clinic after recent R TKA.    Limitations Standing;Walking;House hold activities    Patient Stated Goals Increase L knee ROM/ strength/ improve pain-free walking.    Currently in Pain? Yes    Pain Score 6  Pain Location Knee    Pain Orientation Left;Lateral    Pain Descriptors / Indicators Aching;Constant    Pain Type Surgical pain             There.ex.:  Nustep L510 min. B UE/LE (seat 5).    Walking in //-bars working on consistent gait pattern without UE assist. Standing squats 10x2, heel raises 20x, lateral walking with partial squats 10x L/R in //-bars.    Ambulate in clinic/ hallway working on consistent gait pattern without SPC and cuing to correct posture/ head position.   Step ups/downs 10x L and R with no UE  assist.    Seated LAQ with holds 20x.  Tandem gait in //-bars with no UE assist/ Airex step ups  TM walking at 1.5 mph with consistent arm swing (7 min.).  Instruction to maintain step pattern and equal stance time on L/R.        Manual tx:  Supine L knee flexion/ extension AAROM 5x with static holds.  Supine L knee flexion 112 deg. (pain)  STM to L distal quad/ hamstring.  Reviewed scar massage (no steristrips in place).  Pt. Able to use scar massage cream on proximal aspects of scar.        PT Long Term Goals - 12/29/19 0920      PT LONG TERM GOAL #1   Title Pt. will increase FOTO to 54 to improve functional mobility.    Baseline initial FOTO: 18.    Time 4    Period Weeks    Status New    Target Date 01/26/20      PT LONG TERM GOAL #2   Title Pt. will increase L knee AROM (0 to >115 deg.) to improve independence with gait.    Baseline L knee AROM:  -7 to 84 deg.    Time 4    Period Weeks    Status New    Target Date 01/26/20      PT LONG TERM GOAL #3   Title Patient will demonstrate improved strength of BLE as evidenced by 1/2 increase in MMT for increased function at home and in the community.    Baseline L hip flexion/ abduction strength 4/5 MMT, L quad 3/5 MMT, L hamstring 3/5 MMT.    Time 4    Period Weeks    Status New    Target Date 01/26/20      PT LONG TERM GOAL #4   Title Pt. able to ambulate with consistent 2-point gait pattern and use of SPC on level surfaces safely.    Baseline pt. currently amb. with RW.    Time 4    Period Weeks    Status New    Target Date 01/26/20      PT LONG TERM GOAL #5   Title Pt. able to ambulate with consistent recip. pattern outside on grassy terrain with no issues to improve mobility at home.    Baseline TBD    Time 4    Period Weeks    Status New    Target Date 01/26/20      PT LONG TERM GOAL #6   Title Pt. able to ascend/ descend 10 stairs with recip. pattern and no UE assist to improve mobility.     Baseline handrails required.    Time 4    Period Weeks    Status New    Target Date 01/26/20  Plan - 01/26/20 0747    Clinical Impression Statement Pt. had marked decrease in L distal hamstring discomfort during standing LE ther.ex. today.  L anterior/ lateral knee pain with supine knee flexion stretches (112 deg.).  Pt. ambulates with more normalized gait pattern/ heel strike/ step length as compared to last week.  Good incision healing noted with minimal scabbing on distal aspects of incision.  Good patellar mobility all planes with no pain.    Personal Factors and Comorbidities Profession;Past/Current Experience;Time since onset of injury/illness/exacerbation;Fitness;Comorbidity 3+;Behavior Pattern;Age    Comorbidities HTN, HLD, OA, sleep apnea, vertigo    Examination-Activity Limitations Squat;Lift;Stairs;Stand;Locomotion Level;Sit;Transfers;Sleep;Bend;Bed Mobility    Stability/Clinical Decision Making Evolving/Moderate complexity    Clinical Decision Making Moderate    Rehab Potential Good    PT Frequency 2x / week    PT Duration 4 weeks    PT Treatment/Interventions ADLs/Self Care Home Management;Cryotherapy;Electrical Stimulation;Moist Heat;Stair training;Gait training;Therapeutic activities;Functional mobility training;Therapeutic exercise;Balance training;Orthotic Fit/Training;Patient/family education;Neuromuscular re-education;Manual techniques;Dry needling;Passive range of motion;Scar mobilization;Taping;Joint Manipulations    PT Next Visit Plan Progress L knee ROM/ strengthening.  RECERT next tx. session    PT Home Exercise Plan [id number]    Consulted and Agree with Plan of Care Patient           Patient will benefit from skilled therapeutic intervention in order to improve the following deficits and impairments:  Abnormal gait,Decreased balance,Decreased endurance,Decreased mobility,Difficulty walking,Pain,Postural dysfunction,Decreased strength,Decreased  activity tolerance,Decreased range of motion,Improper body mechanics,Impaired flexibility,Hypomobility  Visit Diagnosis: S/P TKR (total knee replacement), left  Difficulty in walking, not elsewhere classified  Joint stiffness of knee, left  Muscle weakness (generalized)  Acute pain of left knee     Problem List Patient Active Problem List   Diagnosis Date Noted  . S/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, left 12/23/2019  . S/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, right 05/20/2019  . H/O total vaginal hysterectomy 03/12/2017  . Essential hypertension 03/08/2017  . Mixed dyslipidemia 03/08/2017  . Abnormal EKG 03/08/2017  . Preoperative cardiovascular examination 03/08/2017  . Allergic state 12/11/2013  . High cholesterol 12/11/2013  . History of depression 12/11/2013  . Hypothyroidism 12/11/2013  . Sleep apnea 12/11/2013   Cammie Mcgee, PT, DPT # [phone] 01/26/2020, 10:21 AM  [facility] [address] [place], [place], [address] Phone: [phone]   Fax:  [phone]  Name: Jordan Palmer MRN: [id number] Date of Birth: [birth year]

## 2020-01-28 ENCOUNTER — Encounter: Payer: Self-pay | Admitting: Physical Therapy

## 2020-01-28 ENCOUNTER — Ambulatory Visit: Payer: Medicare Other | Admitting: Physical Therapy

## 2020-01-28 DIAGNOSIS — M25662 Stiffness of left knee, not elsewhere classified: Secondary | ICD-10-CM

## 2020-01-28 DIAGNOSIS — Z96652 Presence of left artificial knee joint: Secondary | ICD-10-CM

## 2020-01-28 DIAGNOSIS — M6281 Muscle weakness (generalized): Secondary | ICD-10-CM

## 2020-01-28 DIAGNOSIS — R262 Difficulty in walking, not elsewhere classified: Secondary | ICD-10-CM

## 2020-01-28 DIAGNOSIS — M25562 Pain in left knee: Secondary | ICD-10-CM

## 2020-01-28 NOTE — Therapy (Signed)
[facility] [address] [place], [place], [address] Phone: [phone]   Fax:  [phone]  Physical Therapy Treatment Physical Therapy Progress Note   Dates of reporting period 12/29/19   to   01/28/20  Patient Details  Name: Jordan Palmer MRN: [id number] Date of Birth: 08/20/1950 Referring Provider (PT): Dr. Krasinski   Encounter Date: 01/28/2020   PT End of Session - 01/28/20 0754    Visit Number 10    Number of Visits 18    Date for PT Re-Evaluation 02/25/20    Authorization - Visit Number 1    Authorization - Number of Visits 10    PT Start Time 0801    Activity Tolerance Patient tolerated treatment well;Patient limited by pain    Behavior During Therapy WFL for tasks assessed/performed           Past Medical History:  Diagnosis Date  . Arthritis   . Hyperlipemia   . Hypertension   . Hypothyroidism   . Sleep apnea   . Vertigo     Past Surgical History:  Procedure Laterality Date  . ANTERIOR AND POSTERIOR REPAIR N/A 03/12/2017   Procedure: ANTERIOR (CYSTOCELE) AND POSTERIOR REPAIR (RECTOCELE);  Surgeon: Jertson, Jill Evelyn, MD;  Location: [facility];  Service: Gynecology;  Laterality: N/A;  . CARPAL TUNNEL RELEASE Bilateral   . CHOLECYSTECTOMY    . CYSTOSCOPY N/A 03/12/2017   Procedure: CYSTOSCOPY;  Surgeon: Jertson, Jill Evelyn, MD;  Location: [facility];  Service: Gynecology;  Laterality: N/A;  . EYE SURGERY    . KNEE ARTHROSCOPY Left   . KNEE ARTHROSCOPY WITH MEDIAL MENISECTOMY Right 02/13/2019   Procedure: KNEE ARTHROSCOPY WITH MEDIAL MENISECTOMY;  Surgeon: Krasinski, Kevin, MD;  Location: [facility];  Service: Orthopedics;  Laterality: Right;  . LACRIMAL DUCT PROBING W/ DACRYOPLASTY Bilateral   . SALPINGOOPHORECTOMY Left 03/12/2017   Procedure: SALPINGECTOMY;  Surgeon: Jertson, Jill Evelyn, MD;  Location: [facility];  Service: Gynecology;  Laterality: Left;   . SHOULDER ARTHROSCOPY WITH OPEN ROTATOR CUFF REPAIR Right 09/03/2014   Procedure: SHOULDER ARTHROSCOPY WITH OPEN ROTATOR CUFF REPAIR;  Surgeon: Kevin Krasinski, MD;  Location: [facility];  Service: Orthopedics;  Laterality: Right;  . THYROIDECTOMY Right    hemi-thyroidectomy  . TOTAL KNEE ARTHROPLASTY Right 05/20/2019   Procedure: TOTAL KNEE ARTHROPLASTY;  Surgeon: Krasinski, Kevin, MD;  Location: [facility];  Service: Orthopedics;  Laterality: Right;  . TOTAL KNEE ARTHROPLASTY Left 12/23/2019   Procedure: Left TOTAL KNEE ARTHROPLASTY;  Surgeon: Krasinski, Kevin, MD;  Location: [facility];  Service: Orthopedics;  Laterality: Left;  . VAGINAL HYSTERECTOMY N/A 03/12/2017   Procedure: HYSTERECTOMY VAGINAL;  Surgeon: Jertson, Jill Evelyn, MD;  Location: [facility];  Service: Gynecology;  Laterality: N/A;    There were no vitals filed for this visit.   Subjective Assessment - 01/28/20 0754    Subjective No c/o hamstring pain prior to tx. session.  Pt. entered PT carrying SPC and states she is not using SPC at home.  Pt. reports continued pain in L lateral knee joint and took pain pills prior to tx. session.    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  Pt. received PT prior to/ after knee scope this year.  See PT notes.  Pt. known well to PT clinic after recent R TKA.    Limitations Standing;Walking;House hold activities    Patient Stated Goals Increase L   knee ROM/ strength/ improve pain-free walking.    Currently in Pain? Yes    Pain Score 5     Pain Location Knee    Pain Orientation Left;Lateral    Pain Descriptors / Indicators Aching;Constant              [facility] PT Assessment - 01/28/20 0001      Assessment   Medical Diagnosis S/p L TKA    Referring Provider (PT) Dr. Krasinski    Onset Date/Surgical Date 12/23/19      Prior Function   Level of Independence Independent             There.ex.:  Nustep L510min. B UE/LE (seat 5)-  completed FOTO.    Walking in //-bars working on consistent gait pattern without UE assist. 2#: lateral walking with partial squats 10x L/R in //-bars.  Standing 2# hip/knee ex.:  Heel raises/ knee flexion/ marching/ hip abduction/ extension 20x.      Ambulate in clinic/ hallway working on consistent gait pattern without SPC and cuing to correct posture/ head position.  Step ups/downs (2# ankle wts) 10x L and R with no UE assist.    Supine heel slides with green band (pt. Demonstrates home stretching technique).  Supine SLR 10x2.    Recip. Stair climbing 4 steps x4 with no UE assist on handrails (safely).         Manual tx:  No charge  Supine L knee flexion/ extension AAROM 5x with static holds.  Supine L knee flexion PROM 115 deg. (pain)  Issued scar massage handout for home use.      PT Long Term Goals - 01/28/20 0756      PT LONG TERM GOAL #1   Title Pt. will increase FOTO to 54 to improve functional mobility.    Baseline initial FOTO: 18.  12/29: 58    Time 4    Period Weeks    Status Achieved    Target Date 01/28/20      PT LONG TERM GOAL #2   Title Pt. will increase L knee AROM (0 to >115 deg.) to improve independence with gait.    Baseline L knee AROM:  -7 to 84 deg.   12/29: 0-112 deg. (AROM).  L knee PROM flexion 115 deg. (pain limited)    Time 4    Period Weeks    Status Partially Met    Target Date 02/25/20      PT LONG TERM GOAL #3   Title Patient will demonstrate improved strength of BLE as evidenced by 1/2 increase in MMT for increased function at home and in the community.    Baseline L hip flexion/ abduction strength 4/5 MMT, L quad 3/5 MMT, L hamstring 3/5 MMT.    Time 4    Period Weeks    Status On-going    Target Date 02/25/20      PT LONG TERM GOAL #4   Title Pt. able to ambulate with consistent 2-point gait pattern and use of SPC on level surfaces safely.    Baseline pt. currently amb. with RW.  12/29: pt. carrying SPC more than using  it in with gait pattern.    Time 4    Period Weeks    Status Partially Met    Target Date 02/25/20      PT LONG TERM GOAL #5   Title Pt. able to ambulate with consistent recip. pattern outside on grassy terrain with no issues to improve mobility at   home.    Baseline Pt. walking around pool and in parking area of driveway.  Unable to walk in grass this morning due to rain.    Time 4    Period Weeks    Status On-going    Target Date 02/25/20      PT LONG TERM GOAL #6   Title Pt. able to ascend/ descend 10 stairs with recip. pattern and no UE assist to improve mobility.    Baseline handrails required.    Time 4    Period Weeks    Status Partially Met    Target Date 02/25/20                 Plan - 01/28/20 0755    Clinical Impression Statement Pt. continues to progress with L knee ROM and quad stability on a consistent basis.  L knee AROM: 0-112 deg.  L knee PROM: flexion 115 deg. (pain but able to hold for 30 sec.).  Good patellar tracking/ incision healing noted.  Pt. has 2 small scabs distal to L inferior patella.  See updated goals.  Pt. will continue to benefit from skilled PT services to increase L knee flexion/ LE strengthening to improve pain-free gait.    Personal Factors and Comorbidities Profession;Past/Current Experience;Time since onset of injury/illness/exacerbation;Fitness;Comorbidity 3+;Behavior Pattern;Age    Comorbidities HTN, HLD, OA, sleep apnea, vertigo    Examination-Activity Limitations Squat;Lift;Stairs;Stand;Locomotion Level;Sit;Transfers;Sleep;Bend;Bed Mobility    Stability/Clinical Decision Making Evolving/Moderate complexity    Clinical Decision Making Moderate    Rehab Potential Good    PT Frequency 2x / week    PT Duration 4 weeks    PT Treatment/Interventions ADLs/Self Care Home Management;Cryotherapy;Electrical Stimulation;Moist Heat;Stair training;Gait training;Therapeutic activities;Functional mobility training;Therapeutic exercise;Balance  training;Orthotic Fit/Training;Patient/family education;Neuromuscular re-education;Manual techniques;Dry needling;Passive range of motion;Scar mobilization;Taping;Joint Manipulations    PT Next Visit Plan Progress L knee ROM/ strengthening.    PT Home Exercise Plan [id number]    Consulted and Agree with Plan of Care Patient           Patient will benefit from skilled therapeutic intervention in order to improve the following deficits and impairments:  Abnormal gait,Decreased balance,Decreased endurance,Decreased mobility,Difficulty walking,Pain,Postural dysfunction,Decreased strength,Decreased activity tolerance,Decreased range of motion,Improper body mechanics,Impaired flexibility,Hypomobility  Visit Diagnosis: S/P TKR (total knee replacement), left  Difficulty in walking, not elsewhere classified  Joint stiffness of knee, left  Muscle weakness (generalized)  Acute pain of left knee     Problem List Patient Active Problem List   Diagnosis Date Noted  . S/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, left 12/23/2019  . S/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, right 05/20/2019  . H/O total vaginal hysterectomy 03/12/2017  . Essential hypertension 03/08/2017  . Mixed dyslipidemia 03/08/2017  . Abnormal EKG 03/08/2017  . Preoperative cardiovascular examination 03/08/2017  . Allergic state 12/11/2013  . High cholesterol 12/11/2013  . History of depression 12/11/2013  . Hypothyroidism 12/11/2013  . Sleep apnea 12/11/2013   Pura Spice, PT, DPT # [phone] 01/28/2020, 9:06 AM  [facility] [address] [place], [place], [address] Phone: [phone]   Fax:  [phone]  Name: Jordan Palmer MRN: [id number] Date of Birth: Jun 04, 1950

## 2020-02-02 ENCOUNTER — Ambulatory Visit: Payer: Medicare Other | Admitting: Physical Therapy

## 2020-02-04 ENCOUNTER — Ambulatory Visit: Payer: Medicare Other | Attending: Orthopedic Surgery | Admitting: Physical Therapy

## 2020-02-04 ENCOUNTER — Encounter: Payer: Self-pay | Admitting: Physical Therapy

## 2020-02-04 ENCOUNTER — Other Ambulatory Visit: Payer: Self-pay

## 2020-02-04 DIAGNOSIS — Z96652 Presence of left artificial knee joint: Secondary | ICD-10-CM | POA: Insufficient documentation

## 2020-02-04 DIAGNOSIS — R262 Difficulty in walking, not elsewhere classified: Secondary | ICD-10-CM | POA: Insufficient documentation

## 2020-02-04 DIAGNOSIS — M25562 Pain in left knee: Secondary | ICD-10-CM | POA: Diagnosis present

## 2020-02-04 DIAGNOSIS — M25662 Stiffness of left knee, not elsewhere classified: Secondary | ICD-10-CM | POA: Insufficient documentation

## 2020-02-04 DIAGNOSIS — M6281 Muscle weakness (generalized): Secondary | ICD-10-CM | POA: Diagnosis present

## 2020-02-04 NOTE — Therapy (Signed)
[facility] [address] [place], [place], [address] Phone: [phone]   Fax:  [phone]  Physical Therapy Treatment  Patient Details  Name: Jordan Palmer MRN: [id number] Date of Birth: Aug 15, 1950 Referring Provider (PT): Dr. Mack Guise   Encounter Date: 02/04/2020   PT End of Session - 02/04/20 0754    Visit Number 11    Number of Visits 18    Date for PT Re-Evaluation 02/25/20    Authorization - Visit Number 2    Authorization - Number of Visits 10    PT Start Time 0802    PT Stop Time 3532    PT Time Calculation (min) 57 min    Activity Tolerance Patient tolerated treatment well;Patient limited by pain    Behavior During Therapy [facility] for tasks assessed/performed           Past Medical History:  Diagnosis Date  . Arthritis   . Hyperlipemia   . Hypertension   . Hypothyroidism   . Sleep apnea   . Vertigo     Past Surgical History:  Procedure Laterality Date  . ANTERIOR AND POSTERIOR REPAIR N/A 03/12/2017   Procedure: ANTERIOR (CYSTOCELE) AND POSTERIOR REPAIR (RECTOCELE);  Surgeon: Salvadore Dom, MD;  Location: [facility];  Service: Gynecology;  Laterality: N/A;  . CARPAL TUNNEL RELEASE Bilateral   . CHOLECYSTECTOMY    . CYSTOSCOPY N/A 03/12/2017   Procedure: CYSTOSCOPY;  Surgeon: Salvadore Dom, MD;  Location: [facility];  Service: Gynecology;  Laterality: N/A;  . EYE SURGERY    . KNEE ARTHROSCOPY Left   . KNEE ARTHROSCOPY WITH MEDIAL MENISECTOMY Right 02/13/2019   Procedure: KNEE ARTHROSCOPY WITH MEDIAL MENISECTOMY;  Surgeon: Thornton Park, MD;  Location: [facility];  Service: Orthopedics;  Laterality: Right;  . LACRIMAL DUCT PROBING W/ DACRYOPLASTY Bilateral   . SALPINGOOPHORECTOMY Left 03/12/2017   Procedure: SALPINGECTOMY;  Surgeon: Salvadore Dom, MD;  Location: [facility];  Service: Gynecology;  Laterality: Left;  . SHOULDER ARTHROSCOPY WITH  OPEN ROTATOR CUFF REPAIR Right 09/03/2014   Procedure: SHOULDER ARTHROSCOPY WITH OPEN ROTATOR CUFF REPAIR;  Surgeon: Thornton Park, MD;  Location: [facility];  Service: Orthopedics;  Laterality: Right;  . THYROIDECTOMY Right    hemi-thyroidectomy  . TOTAL KNEE ARTHROPLASTY Right 05/20/2019   Procedure: TOTAL KNEE ARTHROPLASTY;  Surgeon: Thornton Park, MD;  Location: [facility];  Service: Orthopedics;  Laterality: Right;  . TOTAL KNEE ARTHROPLASTY Left 12/23/2019   Procedure: Left TOTAL KNEE ARTHROPLASTY;  Surgeon: Thornton Park, MD;  Location: [facility];  Service: Orthopedics;  Laterality: Left;  Marland Kitchen VAGINAL HYSTERECTOMY N/A 03/12/2017   Procedure: HYSTERECTOMY VAGINAL;  Surgeon: Salvadore Dom, MD;  Location: [facility];  Service: Gynecology;  Laterality: N/A;    There were no vitals filed for this visit.   Subjective Assessment - 02/04/20 0751    Subjective Pt. reports good compliance with HEP.  Pt. drove to PT today for the 1st time and has been off pain meds for 3 days now.  Pt. is no longer using SPC with walking.    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  Pt. received PT prior to/ after knee scope this year.  See PT notes.  Pt. known well to PT clinic after recent R TKA.    Limitations Standing;Walking;House hold activities    Patient Stated Goals Increase L knee ROM/ strength/ improve pain-free walking.  Currently in Pain? Yes    Pain Score 6     Pain Location Knee    Pain Orientation Left;Lateral    Pain Descriptors / Indicators Aching            There.ex.:  Nustep L518mn. B UE/LE (seat 5).   Walking in hallway with increase cadence and no SPC.  Working on consistent step pattern/ heel strike.  6 laps  Walking in //-bars (forward/ backwards/ lateral) working on consistent gait pattern without UE assist.   3#: lateral walking with partial squats 10x L/R in //-bars.  Seated/ standing 3# hip/knee ex.:  Heel  raises/ knee flexion/ marching/ hip abduction/ extension 20x.      TG knee flexion: 30x (good quad muscle activation/ control).    Sit to stands 10x (no UE assist).     Manual tx:  No charge  Supine L knee flexion/ extension AAROM 5x with static holds.Supine L knee flexion PROM 116 deg. (pain)  Pt. Instructed to start scar massage on entire incision (esp. Distal aspect).       PT Long Term Goals - 01/28/20 0756      PT LONG TERM GOAL #1   Title Pt. will increase FOTO to 54 to improve functional mobility.    Baseline initial FOTO: 18.  12/29: 58    Time 4    Period Weeks    Status Achieved    Target Date 01/28/20      PT LONG TERM GOAL #2   Title Pt. will increase L knee AROM (0 to >115 deg.) to improve independence with gait.    Baseline L knee AROM:  -7 to 84 deg.   12/29: 0-112 deg. (AROM).  L knee PROM flexion 115 deg. (pain limited)    Time 4    Period Weeks    Status Partially Met    Target Date 02/25/20      PT LONG TERM GOAL #3   Title Patient will demonstrate improved strength of BLE as evidenced by 1/2 increase in MMT for increased function at home and in the community.    Baseline L hip flexion/ abduction strength 4/5 MMT, L quad 3/5 MMT, L hamstring 3/5 MMT.    Time 4    Period Weeks    Status On-going    Target Date 02/25/20      PT LONG TERM GOAL #4   Title Pt. able to ambulate with consistent 2-point gait pattern and use of SPC on level surfaces safely.    Baseline pt. currently amb. with RW.  12/29: pt. carrying SPC more than using it in with gait pattern.    Time 4    Period Weeks    Status Partially Met    Target Date 02/25/20      PT LONG TERM GOAL #5   Title Pt. able to ambulate with consistent recip. pattern outside on grassy terrain with no issues to improve mobility at home.    Baseline Pt. walking around pool and in parking area of driveway.  Unable to walk in grass this morning due to rain.    Time 4    Period Weeks     Status On-going    Target Date 02/25/20      PT LONG TERM GOAL #6   Title Pt. able to ascend/ descend 10 stairs with recip. pattern and no UE assist to improve mobility.    Baseline handrails required.    Time 4    Period Weeks  Status Partially Met    Target Date 02/25/20                 Plan - 02/04/20 0756    Clinical Impression Statement Pt. reports higher levels of L knee (distal quad/ lateral joint line) pain during tx. session secondary to no pain meds today.  Pt. ambulates with more normalized gait pattern and consistent heel strike/ toe off on L and R.  L knee flexion continues to improve with 116 deg. flexion in supine position (pain reported).  Good quad muscle activation during TG/ sit to stands from low chair.  Pt. will continue to benefit from skilled PT services to increase L knee ROM/ stability/ pain-free mobility.    Personal Factors and Comorbidities Profession;Past/Current Experience;Time since onset of injury/illness/exacerbation;Fitness;Comorbidity 3+;Behavior Pattern;Age    Comorbidities HTN, HLD, OA, sleep apnea, vertigo    Examination-Activity Limitations Squat;Lift;Stairs;Stand;Locomotion Level;Sit;Transfers;Sleep;Bend;Bed Mobility    Stability/Clinical Decision Making Evolving/Moderate complexity    Clinical Decision Making Moderate    Rehab Potential Good    PT Frequency 2x / week    PT Duration 4 weeks    PT Treatment/Interventions ADLs/Self Care Home Management;Cryotherapy;Electrical Stimulation;Moist Heat;Stair training;Gait training;Therapeutic activities;Functional mobility training;Therapeutic exercise;Balance training;Orthotic Fit/Training;Patient/family education;Neuromuscular re-education;Manual techniques;Dry needling;Passive range of motion;Scar mobilization;Taping;Joint Manipulations    PT Next Visit Plan Progress L knee ROM/ strengthening.    PT Home Exercise Plan [id number]    Consulted and Agree with Plan of Care Patient            Patient will benefit from skilled therapeutic intervention in order to improve the following deficits and impairments:  Abnormal gait,Decreased balance,Decreased endurance,Decreased mobility,Difficulty walking,Pain,Postural dysfunction,Decreased strength,Decreased activity tolerance,Decreased range of motion,Improper body mechanics,Impaired flexibility,Hypomobility  Visit Diagnosis: S/P TKR (total knee replacement), left  Difficulty in walking, not elsewhere classified  Joint stiffness of knee, left  Muscle weakness (generalized)     Problem List Patient Active Problem List   Diagnosis Date Noted  . S/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, left 12/23/2019  . S/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, right 05/20/2019  . H/O total vaginal hysterectomy 03/12/2017  . Essential hypertension 03/08/2017  . Mixed dyslipidemia 03/08/2017  . Abnormal EKG 03/08/2017  . Preoperative cardiovascular examination 03/08/2017  . Allergic state 12/11/2013  . High cholesterol 12/11/2013  . History of depression 12/11/2013  . Hypothyroidism 12/11/2013  . Sleep apnea 12/11/2013   Pura Spice, PT, DPT # [phone] 02/04/2020, 9:40 AM  [facility] [address] [place], [place], [address] Phone: [phone]   Fax:  [phone]  Name: Jordan Palmer MRN: [id number] Date of Birth: Oct 01, 1950

## 2020-02-09 ENCOUNTER — Ambulatory Visit: Payer: Medicare Other | Admitting: Physical Therapy

## 2020-02-09 ENCOUNTER — Encounter: Payer: Self-pay | Admitting: Physical Therapy

## 2020-02-09 ENCOUNTER — Other Ambulatory Visit: Payer: Self-pay

## 2020-02-09 DIAGNOSIS — M6281 Muscle weakness (generalized): Secondary | ICD-10-CM

## 2020-02-09 DIAGNOSIS — M25562 Pain in left knee: Secondary | ICD-10-CM

## 2020-02-09 DIAGNOSIS — Z96652 Presence of left artificial knee joint: Secondary | ICD-10-CM | POA: Diagnosis not present

## 2020-02-09 DIAGNOSIS — R262 Difficulty in walking, not elsewhere classified: Secondary | ICD-10-CM

## 2020-02-09 DIAGNOSIS — M25662 Stiffness of left knee, not elsewhere classified: Secondary | ICD-10-CM

## 2020-02-09 NOTE — Therapy (Signed)
[facility] [address] [place], [place], [address] Phone: [phone]   Fax:  [phone]  Physical Therapy Treatment  Patient Details  Name: [place] MRN: [id number] Date of Birth: 07-19-1950 Referring Provider (PT): Dr. Mack Guise   Encounter Date: 02/09/2020   PT End of Session - 02/09/20 0812    Visit Number 12    Number of Visits 18    Date for PT Re-Evaluation 02/25/20    Authorization - Visit Number 3    Authorization - Number of Visits 10    PT Start Time 0807    PT Stop Time 0901    PT Time Calculation (min) 54 min    Activity Tolerance Patient tolerated treatment well;Patient limited by pain    Behavior During Therapy [facility] for tasks assessed/performed           Past Medical History:  Diagnosis Date  . Arthritis   . Hyperlipemia   . Hypertension   . Hypothyroidism   . Sleep apnea   . Vertigo     Past Surgical History:  Procedure Laterality Date  . ANTERIOR AND POSTERIOR REPAIR N/A 03/12/2017   Procedure: ANTERIOR (CYSTOCELE) AND POSTERIOR REPAIR (RECTOCELE);  Surgeon: Salvadore Dom, MD;  Location: [facility];  Service: Gynecology;  Laterality: N/A;  . CARPAL TUNNEL RELEASE Bilateral   . CHOLECYSTECTOMY    . CYSTOSCOPY N/A 03/12/2017   Procedure: CYSTOSCOPY;  Surgeon: Salvadore Dom, MD;  Location: [facility];  Service: Gynecology;  Laterality: N/A;  . EYE SURGERY    . KNEE ARTHROSCOPY Left   . KNEE ARTHROSCOPY WITH MEDIAL MENISECTOMY Right 02/13/2019   Procedure: KNEE ARTHROSCOPY WITH MEDIAL MENISECTOMY;  Surgeon: Thornton Park, MD;  Location: [facility];  Service: Orthopedics;  Laterality: Right;  . LACRIMAL DUCT PROBING W/ DACRYOPLASTY Bilateral   . SALPINGOOPHORECTOMY Left 03/12/2017   Procedure: SALPINGECTOMY;  Surgeon: Salvadore Dom, MD;  Location: [facility];  Service: Gynecology;  Laterality: Left;  . SHOULDER ARTHROSCOPY WITH  OPEN ROTATOR CUFF REPAIR Right 09/03/2014   Procedure: SHOULDER ARTHROSCOPY WITH OPEN ROTATOR CUFF REPAIR;  Surgeon: Thornton Park, MD;  Location: [facility];  Service: Orthopedics;  Laterality: Right;  . THYROIDECTOMY Right    hemi-thyroidectomy  . TOTAL KNEE ARTHROPLASTY Right 05/20/2019   Procedure: TOTAL KNEE ARTHROPLASTY;  Surgeon: Thornton Park, MD;  Location: [facility];  Service: Orthopedics;  Laterality: Right;  . TOTAL KNEE ARTHROPLASTY Left 12/23/2019   Procedure: Left TOTAL KNEE ARTHROPLASTY;  Surgeon: Thornton Park, MD;  Location: [facility];  Service: Orthopedics;  Laterality: Left;  Marland Kitchen VAGINAL HYSTERECTOMY N/A 03/12/2017   Procedure: HYSTERECTOMY VAGINAL;  Surgeon: Salvadore Dom, MD;  Location: [facility];  Service: Gynecology;  Laterality: N/A;    There were no vitals filed for this visit.   Subjective Assessment - 02/09/20 1004    Subjective Pt. states she drove to PT today and didn't take pain pills so she is hurting this morning.  Pt. reports 5-6/10 L knee pain walking into PT clinic.  Pt. reports tenderness/ soreness on L distal incision (small scab noted) while using Nustep with pants rubbing.  Pt. applied bandaide over L knee incision.    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  Pt. received PT prior to/ after knee scope this year.  See PT notes.  Pt. known well to PT clinic after recent R TKA.  Limitations Standing;Walking;House hold activities    Patient Stated Goals Increase L knee ROM/ strength/ improve pain-free walking.    Currently in Pain? Yes    Pain Score 6     Pain Location Knee    Pain Orientation Left            Send MD progress note after next PT tx.      There.ex.:  Nustep L518mn. B UE/LE (seat 5)- warm-up/no charge.   Walking in hallway with increase cadence/ cuing to increase hip and knee flexion. Working on consistent step pattern/ heel strike.  6 laps  TG knee flexion: 30x  (good quad muscle activation/ control).    2nd step knee flexion/ hamstring stretches 10x.  6"/12" L step ups/ downs 10x/5x each.    Supine SLR/ marching/ bridging 20x each.    Nautilus: resisted gait 50# 5x all 4-directions.  SBA with forward/lateral step pattern.  Supine L knee flexion/ extension AAROM 5x with static holds.Supine L knee flexionPROM.  Patellar mobs (all planes)        PT Long Term Goals - 01/28/20 0756      PT LONG TERM GOAL #1   Title Pt. will increase FOTO to 54 to improve functional mobility.    Baseline initial FOTO: 18.  12/29: 58    Time 4    Period Weeks    Status Achieved    Target Date 01/28/20      PT LONG TERM GOAL #2   Title Pt. will increase L knee AROM (0 to >115 deg.) to improve independence with gait.    Baseline L knee AROM:  -7 to 84 deg.   12/29: 0-112 deg. (AROM).  L knee PROM flexion 115 deg. (pain limited)    Time 4    Period Weeks    Status Partially Met    Target Date 02/25/20      PT LONG TERM GOAL #3   Title Patient will demonstrate improved strength of BLE as evidenced by 1/2 increase in MMT for increased function at home and in the community.    Baseline L hip flexion/ abduction strength 4/5 MMT, L quad 3/5 MMT, L hamstring 3/5 MMT.    Time 4    Period Weeks    Status On-going    Target Date 02/25/20      PT LONG TERM GOAL #4   Title Pt. able to ambulate with consistent 2-point gait pattern and use of SPC on level surfaces safely.    Baseline pt. currently amb. with RW.  12/29: pt. carrying SPC more than using it in with gait pattern.    Time 4    Period Weeks    Status Partially Met    Target Date 02/25/20      PT LONG TERM GOAL #5   Title Pt. able to ambulate with consistent recip. pattern outside on grassy terrain with no issues to improve mobility at home.    Baseline Pt. walking around pool and in parking area of driveway.  Unable to walk in grass this morning due to rain.    Time 4    Period Weeks     Status On-going    Target Date 02/25/20      PT LONG TERM GOAL #6   Title Pt. able to ascend/ descend 10 stairs with recip. pattern and no UE assist to improve mobility.    Baseline handrails required.    Time 4    Period Weeks  Status Partially Met    Target Date 02/25/20                 Plan - 02/09/20 1007    Clinical Impression Statement L distal incision tenderness/ soreness when pants rub over incison.  Pt. applied band aide to prevent rubbing over pants.  Pt. ambulates with consistent step pattern/ heel strike but moderate cuing to correct upright posture/ head position.  Pt. returns to MD after next PT visit this Wednesday.  Pt. able to step up/down on 12 inch step with min. UE assist at stairs.    Personal Factors and Comorbidities Profession;Past/Current Experience;Time since onset of injury/illness/exacerbation;Fitness;Comorbidity 3+;Behavior Pattern;Age    Comorbidities HTN, HLD, OA, sleep apnea, vertigo    Examination-Activity Limitations Squat;Lift;Stairs;Stand;Locomotion Level;Sit;Transfers;Sleep;Bend;Bed Mobility    Stability/Clinical Decision Making Evolving/Moderate complexity    Clinical Decision Making Moderate    Rehab Potential Good    PT Frequency 2x / week    PT Duration 4 weeks    PT Treatment/Interventions ADLs/Self Care Home Management;Cryotherapy;Electrical Stimulation;Moist Heat;Stair training;Gait training;Therapeutic activities;Functional mobility training;Therapeutic exercise;Balance training;Orthotic Fit/Training;Patient/family education;Neuromuscular re-education;Manual techniques;Dry needling;Passive range of motion;Scar mobilization;Taping;Joint Manipulations    PT Next Visit Plan Progress L knee ROM/ strengthening.    PT Home Exercise Plan [id number]    Consulted and Agree with Plan of Care Patient           Patient will benefit from skilled therapeutic intervention in order to improve the following deficits and impairments:  Abnormal  gait,Decreased balance,Decreased endurance,Decreased mobility,Difficulty walking,Pain,Postural dysfunction,Decreased strength,Decreased activity tolerance,Decreased range of motion,Improper body mechanics,Impaired flexibility,Hypomobility  Visit Diagnosis: S/P TKR (total knee replacement), left  Difficulty in walking, not elsewhere classified  Joint stiffness of knee, left  Muscle weakness (generalized)  Acute pain of left knee     Problem List Patient Active Problem List   Diagnosis Date Noted  . S/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, left 12/23/2019  . S/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, right 05/20/2019  . H/O total vaginal hysterectomy 03/12/2017  . Essential hypertension 03/08/2017  . Mixed dyslipidemia 03/08/2017  . Abnormal EKG 03/08/2017  . Preoperative cardiovascular examination 03/08/2017  . Allergic state 12/11/2013  . High cholesterol 12/11/2013  . History of depression 12/11/2013  . Hypothyroidism 12/11/2013  . Sleep apnea 12/11/2013   Pura Spice, PT, DPT # [phone] 02/09/2020, 10:10 AM  [facility] [address] [place], [place], [address] Phone: [phone]   Fax:  [phone]  Name: [place] Palmer MRN: [id number] Date of Birth: 1950-08-08

## 2020-02-11 ENCOUNTER — Other Ambulatory Visit: Payer: Self-pay

## 2020-02-11 ENCOUNTER — Ambulatory Visit: Payer: Medicare Other | Admitting: Physical Therapy

## 2020-02-11 ENCOUNTER — Encounter: Payer: Self-pay | Admitting: Physical Therapy

## 2020-02-11 DIAGNOSIS — M25562 Pain in left knee: Secondary | ICD-10-CM

## 2020-02-11 DIAGNOSIS — M25662 Stiffness of left knee, not elsewhere classified: Secondary | ICD-10-CM

## 2020-02-11 DIAGNOSIS — M6281 Muscle weakness (generalized): Secondary | ICD-10-CM

## 2020-02-11 DIAGNOSIS — Z96652 Presence of left artificial knee joint: Secondary | ICD-10-CM | POA: Diagnosis not present

## 2020-02-11 DIAGNOSIS — R262 Difficulty in walking, not elsewhere classified: Secondary | ICD-10-CM

## 2020-02-11 NOTE — Therapy (Signed)
[facility] [address] [place], [place], [address] Phone: [phone]   Fax:  [phone]  Physical Therapy Treatment  Patient Details  Name: [place] Palmer MRN: [id number] Date of Birth: [birth year] Referring Provider (PT): Dr. Mack Guise   Encounter Date: 02/11/2020   PT End of Session - 02/11/20 0833    Visit Number 13    Number of Visits 18    Date for PT Re-Evaluation 02/25/20    Authorization - Visit Number 4    Authorization - Number of Visits 10    PT Start Time 0805    PT Stop Time 0902    PT Time Calculation (min) 57 min    Activity Tolerance Patient tolerated treatment well;Patient limited by pain    Behavior During Therapy [facility] for tasks assessed/performed           Past Medical History:  Diagnosis Date  . Arthritis   . Hyperlipemia   . Hypertension   . Hypothyroidism   . Sleep apnea   . Vertigo     Past Surgical History:  Procedure Laterality Date  . ANTERIOR AND POSTERIOR REPAIR N/A 03/12/2017   Procedure: ANTERIOR (CYSTOCELE) AND POSTERIOR REPAIR (RECTOCELE);  Surgeon: Salvadore Dom, MD;  Location: [facility];  Service: Gynecology;  Laterality: N/A;  . CARPAL TUNNEL RELEASE Bilateral   . CHOLECYSTECTOMY    . CYSTOSCOPY N/A 03/12/2017   Procedure: CYSTOSCOPY;  Surgeon: Salvadore Dom, MD;  Location: [facility];  Service: Gynecology;  Laterality: N/A;  . EYE SURGERY    . KNEE ARTHROSCOPY Left   . KNEE ARTHROSCOPY WITH MEDIAL MENISECTOMY Right 02/13/2019   Procedure: KNEE ARTHROSCOPY WITH MEDIAL MENISECTOMY;  Surgeon: Thornton Park, MD;  Location: [facility];  Service: Orthopedics;  Laterality: Right;  . LACRIMAL DUCT PROBING W/ DACRYOPLASTY Bilateral   . SALPINGOOPHORECTOMY Left 03/12/2017   Procedure: SALPINGECTOMY;  Surgeon: Salvadore Dom, MD;  Location: [facility];  Service: Gynecology;  Laterality: Left;  . SHOULDER ARTHROSCOPY WITH  OPEN ROTATOR CUFF REPAIR Right 09/03/2014   Procedure: SHOULDER ARTHROSCOPY WITH OPEN ROTATOR CUFF REPAIR;  Surgeon: Thornton Park, MD;  Location: [facility];  Service: Orthopedics;  Laterality: Right;  . THYROIDECTOMY Right    hemi-thyroidectomy  . TOTAL KNEE ARTHROPLASTY Right 05/20/2019   Procedure: TOTAL KNEE ARTHROPLASTY;  Surgeon: Thornton Park, MD;  Location: [facility];  Service: Orthopedics;  Laterality: Right;  . TOTAL KNEE ARTHROPLASTY Left 12/23/2019   Procedure: Left TOTAL KNEE ARTHROPLASTY;  Surgeon: Thornton Park, MD;  Location: [facility];  Service: Orthopedics;  Laterality: Left;  Marland Kitchen VAGINAL HYSTERECTOMY N/A 03/12/2017   Procedure: HYSTERECTOMY VAGINAL;  Surgeon: Salvadore Dom, MD;  [facility];  Service: Gynecology;  Laterality: N/A;    There were no vitals filed for this visit.   Subjective Assessment - 02/11/20 0830    Subjective Pt. reports 5/10 L knee pain prior to tx. session.  Pt. has a band aide to cover small scab on incision to prevent rubbing on pants.  Pt. is still sleeping in recliner and PT encouraging pt. to attempt to sleep in bed.    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  Pt. received PT prior to/ after knee scope this year.  See PT notes.  Pt. known well to PT clinic after recent R TKA.    Limitations Standing;Walking;House hold activities    Patient Stated Goals  Increase L knee ROM/ strength/ improve pain-free walking.    Currently in Pain? Yes    Pain Score 5     Pain Location Knee    Pain Orientation Left            L knee joint line circumferential: 39.5 cm.    L knee AROM (in supine): 0 to 117 deg.   There.ex.:  Nustep L536mn. B UE/LE (seat 5)- warm-up/no charge.  Standing 2nd step knee flexion 20x with holds.  12" L step ups/ downs 10x2 each.    Walking in hallway with increase cadence/ cuing to increase hip and knee flexion. Working on consistent step pattern/ heel  strike. 6 laps  TG knee flexion/heel raises: 20x (good quad muscle activation/ control).  Resisted gait 2BTB 5x all 4-planes with minimal UE assist/ mirror feedback.  Marked increase in step length.   Supine L hip/knee manual isometric 5x with moderate resistance.    Supine L knee flexion/ extension reassessment: 0-117 deg.       PT Long Term Goals - 01/28/20 0756      PT LONG TERM GOAL #1   Title Pt. will increase FOTO to 54 to improve functional mobility.    Baseline initial FOTO: 18.  12/29: 58    Time 4    Period Weeks    Status Achieved    Target Date 01/28/20      PT LONG TERM GOAL #2   Title Pt. will increase L knee AROM (0 to >115 deg.) to improve independence with gait.    Baseline L knee AROM:  -7 to 84 deg.   12/29: 0-112 deg. (AROM).  L knee PROM flexion 115 deg. (pain limited)    Time 4    Period Weeks    Status Partially Met    Target Date 02/25/20      PT LONG TERM GOAL #3   Title Patient will demonstrate improved strength of BLE as evidenced by 1/2 increase in MMT for increased function at home and in the community.    Baseline L hip flexion/ abduction strength 4/5 MMT, L quad 3/5 MMT, L hamstring 3/5 MMT.    Time 4    Period Weeks    Status On-going    Target Date 02/25/20      PT LONG TERM GOAL #4   Title Pt. able to ambulate with consistent 2-point gait pattern and use of SPC on level surfaces safely.    Baseline pt. currently amb. with RW.  12/29: pt. carrying SPC more than using it in with gait pattern.    Time 4    Period Weeks    Status Partially Met    Target Date 02/25/20      PT LONG TERM GOAL #5   Title Pt. able to ambulate with consistent recip. pattern outside on grassy terrain with no issues to improve mobility at home.    Baseline Pt. walking around pool and in parking area of driveway.  Unable to walk in grass this morning due to rain.    Time 4    Period Weeks    Status On-going    Target Date 02/25/20      PT LONG TERM  GOAL #6   Title Pt. able to ascend/ descend 10 stairs with recip. pattern and no UE assist to improve mobility.    Baseline handrails required.    Time 4    Period Weeks    Status Partially Met  Target Date 02/25/20                 Plan - 02/11/20 0946    Clinical Impression Statement Pt. continues to progress well towards all PT goals.  L knee flexion 117 deg. AAROM in supine position.  Good patellar tracking/ incision healing.  Pt. wearing a band aide on distal aspect of incision due to small scab running on pants.  Pt. able to complete step ups with good quad control and ambulates with more normalized gait pattern.  Pt. returns to MD today for f/u visit.  PT has no concerns with pts. progress.  L quad/ hamstring strength 4+/5 MMT and L hip flexion 4/5 MMT bilaterally.    Personal Factors and Comorbidities Profession;Past/Current Experience;Time since onset of injury/illness/exacerbation;Fitness;Comorbidity 3+;Behavior Pattern;Age    Comorbidities HTN, HLD, OA, sleep apnea, vertigo    Examination-Activity Limitations Squat;Lift;Stairs;Stand;Locomotion Level;Sit;Transfers;Sleep;Bend;Bed Mobility    Stability/Clinical Decision Making Evolving/Moderate complexity    Clinical Decision Making Moderate    Rehab Potential Good    PT Frequency 2x / week    PT Duration 4 weeks    PT Treatment/Interventions ADLs/Self Care Home Management;Cryotherapy;Electrical Stimulation;Moist Heat;Stair training;Gait training;Therapeutic activities;Functional mobility training;Therapeutic exercise;Balance training;Orthotic Fit/Training;Patient/family education;Neuromuscular re-education;Manual techniques;Dry needling;Passive range of motion;Scar mobilization;Taping;Joint Manipulations    PT Next Visit Plan Progress L knee ROM/ strengthening.    PT Home Exercise Plan [id number]    Consulted and Agree with Plan of Care Patient           Patient will benefit from skilled therapeutic intervention in order  to improve the following deficits and impairments:  Abnormal gait,Decreased balance,Decreased endurance,Decreased mobility,Difficulty walking,Pain,Postural dysfunction,Decreased strength,Decreased activity tolerance,Decreased range of motion,Improper body mechanics,Impaired flexibility,Hypomobility  Visit Diagnosis: S/P TKR (total knee replacement), left  Difficulty in walking, not elsewhere classified  Joint stiffness of knee, left  Muscle weakness (generalized)  Acute pain of left knee     Problem List Patient Active Problem List   Diagnosis Date Noted  . S/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, left 12/23/2019  . S/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, right 05/20/2019  . H/O total vaginal hysterectomy 03/12/2017  . Essential hypertension 03/08/2017  . Mixed dyslipidemia 03/08/2017  . Abnormal EKG 03/08/2017  . Preoperative cardiovascular examination 03/08/2017  . Allergic state 12/11/2013  . High cholesterol 12/11/2013  . History of depression 12/11/2013  . Hypothyroidism 12/11/2013  . Sleep apnea 12/11/2013   Pura Spice, PT, DPT # [phone] 02/11/2020, 9:49 AM  [facility] [address] [place], [place], [address] Phone: [phone]   Fax:  [phone]  Name: RYANNA TESCHNER MRN: [id number] Date of Birth: [birth year]

## 2020-02-16 ENCOUNTER — Ambulatory Visit: Payer: Medicare Other | Admitting: Physical Therapy

## 2020-02-18 ENCOUNTER — Ambulatory Visit: Payer: Medicare Other | Admitting: Physical Therapy

## 2020-02-23 ENCOUNTER — Ambulatory Visit: Payer: Medicare Other | Admitting: Physical Therapy

## 2020-02-25 ENCOUNTER — Ambulatory Visit: Payer: Medicare Other | Admitting: Physical Therapy

## 2020-02-25 ENCOUNTER — Other Ambulatory Visit: Payer: Self-pay

## 2020-02-25 ENCOUNTER — Encounter: Payer: Self-pay | Admitting: Physical Therapy

## 2020-02-25 DIAGNOSIS — M6281 Muscle weakness (generalized): Secondary | ICD-10-CM

## 2020-02-25 DIAGNOSIS — M25662 Stiffness of left knee, not elsewhere classified: Secondary | ICD-10-CM

## 2020-02-25 DIAGNOSIS — M25562 Pain in left knee: Secondary | ICD-10-CM

## 2020-02-25 DIAGNOSIS — Z96652 Presence of left artificial knee joint: Secondary | ICD-10-CM

## 2020-02-25 DIAGNOSIS — R262 Difficulty in walking, not elsewhere classified: Secondary | ICD-10-CM

## 2020-02-27 NOTE — Therapy (Signed)
[facility] [address] [place], [place], [address] Phone: [phone]   Fax:  [phone]  Physical Therapy Treatment  Patient Details  Name: Jordan Palmer MRN: [id number] Date of Birth: 19-Mar-1950 Referring Provider (PT): Dr. Mack Guise   Encounter Date: 02/25/2020   PT End of Session - 02/26/20 1333    Visit Number 14    Number of Visits 18    Date for PT Re-Evaluation 02/25/20    Authorization - Visit Number 5    Authorization - Number of Visits 10    PT Start Time 0758    PT Stop Time 0850    PT Time Calculation (min) 52 min    Activity Tolerance Patient tolerated treatment well    Behavior During Therapy [facility] for tasks assessed/performed           Past Medical History:  Diagnosis Date  . Arthritis   . Hyperlipemia   . Hypertension   . Hypothyroidism   . Sleep apnea   . Vertigo     Past Surgical History:  Procedure Laterality Date  . ANTERIOR AND POSTERIOR REPAIR N/A 03/12/2017   Procedure: ANTERIOR (CYSTOCELE) AND POSTERIOR REPAIR (RECTOCELE);  Surgeon: Salvadore Dom, MD;  Location: [facility];  Service: Gynecology;  Laterality: N/A;  . CARPAL TUNNEL RELEASE Bilateral   . CHOLECYSTECTOMY    . CYSTOSCOPY N/A 03/12/2017   Procedure: CYSTOSCOPY;  Surgeon: Salvadore Dom, MD;  Location: [facility];  Service: Gynecology;  Laterality: N/A;  . EYE SURGERY    . KNEE ARTHROSCOPY Left   . KNEE ARTHROSCOPY WITH MEDIAL MENISECTOMY Right 02/13/2019   Procedure: KNEE ARTHROSCOPY WITH MEDIAL MENISECTOMY;  Surgeon: Thornton Park, MD;  Location: [facility];  Service: Orthopedics;  Laterality: Right;  . LACRIMAL DUCT PROBING W/ DACRYOPLASTY Bilateral   . SALPINGOOPHORECTOMY Left 03/12/2017   Procedure: SALPINGECTOMY;  Surgeon: Salvadore Dom, MD;  Location: [facility];  Service: Gynecology;  Laterality: Left;  . SHOULDER ARTHROSCOPY WITH OPEN ROTATOR CUFF REPAIR  Right 09/03/2014   Procedure: SHOULDER ARTHROSCOPY WITH OPEN ROTATOR CUFF REPAIR;  Surgeon: Thornton Park, MD;  Location: [facility];  Service: Orthopedics;  Laterality: Right;  . THYROIDECTOMY Right    hemi-thyroidectomy  . TOTAL KNEE ARTHROPLASTY Right 05/20/2019   Procedure: TOTAL KNEE ARTHROPLASTY;  Surgeon: Thornton Park, MD;  Location: [facility];  Service: Orthopedics;  Laterality: Right;  . TOTAL KNEE ARTHROPLASTY Left 12/23/2019   Procedure: Left TOTAL KNEE ARTHROPLASTY;  Surgeon: Thornton Park, MD;  Location: [facility];  Service: Orthopedics;  Laterality: Left;  Marland Kitchen VAGINAL HYSTERECTOMY N/A 03/12/2017   Procedure: HYSTERECTOMY VAGINAL;  Surgeon: Salvadore Dom, MD;  Location: [facility];  Service: Gynecology;  Laterality: N/A;    There were no vitals filed for this visit.   Subjective Assessment - 02/26/20 1315    Subjective Pt. missed last 3 PT appointments due to icy weather.  Pt. reports compliance with HEP and staying active at home.  No c/o pain at this time.    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  Pt. received PT prior to/ after knee scope this year.  See PT notes.  Pt. known well to PT clinic after recent R TKA.    Limitations Standing;Walking;House hold activities    Patient Stated Goals Increase L knee ROM/ strength/ improve pain-free walking.    Currently in Pain? No/denies  There.ex.:  Nustep L531mn. B UE/LE (seat 5)- warm-up/no charge.  Discussed HEP and activity at home.    Recip. Stair climbing: no UE assist 5x (focus on eccentric L quad control).    Walking in hallway with increase cadence/ cuing to increase hip and knee flexion. Working on consistent step pattern/ heel strike. 6 laps.   High marching with added alt. UE touches.    TG knee flexion/heel raises: 20x (good quad muscle activation/ control).Single leg 10x L/R (difficulty)  Resisted gait 2BTB 5x all 4-planes with  minimal UE assist/ mirror feedback.  Marked increase in step length.   Supine L hip/knee manual isometric 5x with moderate resistance.    BOSU lunges/ step ups in //-bars.         PT Long Term Goals - 01/28/20 0756      PT LONG TERM GOAL #1   Title Pt. will increase FOTO to 54 to improve functional mobility.    Baseline initial FOTO: 18.  12/29: 58    Time 4    Period Weeks    Status Achieved    Target Date 01/28/20      PT LONG TERM GOAL #2   Title Pt. will increase L knee AROM (0 to >115 deg.) to improve independence with gait.    Baseline L knee AROM:  -7 to 84 deg.   12/29: 0-112 deg. (AROM).  L knee PROM flexion 115 deg. (pain limited)    Time 4    Period Weeks    Status Partially Met    Target Date 02/25/20      PT LONG TERM GOAL #3   Title Patient will demonstrate improved strength of BLE as evidenced by 1/2 increase in MMT for increased function at home and in the community.    Baseline L hip flexion/ abduction strength 4/5 MMT, L quad 3/5 MMT, L hamstring 3/5 MMT.    Time 4    Period Weeks    Status On-going    Target Date 02/25/20      PT LONG TERM GOAL #4   Title Pt. able to ambulate with consistent 2-point gait pattern and use of SPC on level surfaces safely.    Baseline pt. currently amb. with RW.  12/29: pt. carrying SPC more than using it in with gait pattern.    Time 4    Period Weeks    Status Partially Met    Target Date 02/25/20      PT LONG TERM GOAL #5   Title Pt. able to ambulate with consistent recip. pattern outside on grassy terrain with no issues to improve mobility at home.    Baseline Pt. walking around pool and in parking area of driveway.  Unable to walk in grass this morning due to rain.    Time 4    Period Weeks    Status On-going    Target Date 02/25/20      PT LONG TERM GOAL #6   Title Pt. able to ascend/ descend 10 stairs with recip. pattern and no UE assist to improve mobility.    Baseline handrails required.    Time 4     Period Weeks    Status Partially Met    Target Date 02/25/20                 Plan - 02/26/20 1333    Clinical Impression Statement Pt. has 2 small scabs present on incision and patient has been completing scar massage with  good incision healing.  L knee AROM doing well with focus on quad/ LE strengthening and normalizing gait pattern.  Pt. ambulates with a more consistent gait pattern with heel strike/ step pattern.  Pt. completes stairs with recip. pattern and improved eccentric quad control during descending without handrail support.    Personal Factors and Comorbidities Profession;Past/Current Experience;Time since onset of injury/illness/exacerbation;Fitness;Comorbidity 3+;Behavior Pattern;Age    Comorbidities HTN, HLD, OA, sleep apnea, vertigo    Examination-Activity Limitations Squat;Lift;Stairs;Stand;Locomotion Level;Sit;Transfers;Sleep;Bend;Bed Mobility    Stability/Clinical Decision Making Evolving/Moderate complexity    Clinical Decision Making Moderate    Rehab Potential Good    PT Frequency 2x / week    PT Duration 4 weeks    PT Treatment/Interventions ADLs/Self Care Home Management;Cryotherapy;Electrical Stimulation;Moist Heat;Stair training;Gait training;Therapeutic activities;Functional mobility training;Therapeutic exercise;Balance training;Orthotic Fit/Training;Patient/family education;Neuromuscular re-education;Manual techniques;Dry needling;Passive range of motion;Scar mobilization;Taping;Joint Manipulations    PT Next Visit Plan Progress L knee ROM/ strengthening.   CHECK GOALS    PT Home Exercise Plan [id number]    Consulted and Agree with Plan of Care Patient           Patient will benefit from skilled therapeutic intervention in order to improve the following deficits and impairments:  Abnormal gait,Decreased balance,Decreased endurance,Decreased mobility,Difficulty walking,Pain,Postural dysfunction,Decreased strength,Decreased activity tolerance,Decreased range  of motion,Improper body mechanics,Impaired flexibility,Hypomobility  Visit Diagnosis: S/P TKR (total knee replacement), left  Difficulty in walking, not elsewhere classified  Joint stiffness of knee, left  Muscle weakness (generalized)  Acute pain of left knee     Problem List Patient Active Problem List   Diagnosis Date Noted  . S/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, left 12/23/2019  . S/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, right 05/20/2019  . H/O total vaginal hysterectomy 03/12/2017  . Essential hypertension 03/08/2017  . Mixed dyslipidemia 03/08/2017  . Abnormal EKG 03/08/2017  . Preoperative cardiovascular examination 03/08/2017  . Allergic state 12/11/2013  . High cholesterol 12/11/2013  . History of depression 12/11/2013  . Hypothyroidism 12/11/2013  . Sleep apnea 12/11/2013   Pura Spice, PT, DPT # [phone] 02/27/2020, 8:49 AM  [facility] [address] [place], [place], [address] Phone: [phone]   Fax:  [phone]  Name: Jordan Palmer MRN: [id number] Date of Birth: [birth year]

## 2020-03-01 ENCOUNTER — Encounter: Payer: Self-pay | Admitting: Physical Therapy

## 2020-03-01 ENCOUNTER — Ambulatory Visit: Payer: Medicare Other | Admitting: Physical Therapy

## 2020-03-01 ENCOUNTER — Other Ambulatory Visit: Payer: Self-pay

## 2020-03-01 DIAGNOSIS — R262 Difficulty in walking, not elsewhere classified: Secondary | ICD-10-CM

## 2020-03-01 DIAGNOSIS — M6281 Muscle weakness (generalized): Secondary | ICD-10-CM

## 2020-03-01 DIAGNOSIS — Z96652 Presence of left artificial knee joint: Secondary | ICD-10-CM

## 2020-03-01 DIAGNOSIS — M25562 Pain in left knee: Secondary | ICD-10-CM

## 2020-03-01 DIAGNOSIS — M25662 Stiffness of left knee, not elsewhere classified: Secondary | ICD-10-CM

## 2020-03-01 NOTE — Therapy (Signed)
[facility] [address] [place], [place], [address] Phone: [phone]   Fax:  [phone]  Physical Therapy Treatment/DISCHARGE  Patient Details  Name: Jordan Palmer MRN: [id number] Date of Birth: 06-15-1950 Referring Provider (PT): Dr. Mack Guise   Encounter Date: 03/01/2020   PT End of Session - 03/01/20 0825    Visit Number 15    Number of Visits 15    Date for PT Re-Evaluation 03/01/20    Authorization - Visit Number 6    Authorization - Number of Visits 10    PT Start Time 0803    PT Stop Time 1324    PT Time Calculation (min) 52 min    Activity Tolerance Patient tolerated treatment well    Behavior During Therapy [facility] for tasks assessed/performed           Past Medical History:  Diagnosis Date  . Arthritis   . Hyperlipemia   . Hypertension   . Hypothyroidism   . Sleep apnea   . Vertigo     Past Surgical History:  Procedure Laterality Date  . ANTERIOR AND POSTERIOR REPAIR N/A 03/12/2017   Procedure: ANTERIOR (CYSTOCELE) AND POSTERIOR REPAIR (RECTOCELE);  Surgeon: Salvadore Dom, MD;  Location: [facility];  Service: Gynecology;  Laterality: N/A;  . CARPAL TUNNEL RELEASE Bilateral   . CHOLECYSTECTOMY    . CYSTOSCOPY N/A 03/12/2017   Procedure: CYSTOSCOPY;  Surgeon: Salvadore Dom, MD;  Location: [facility];  Service: Gynecology;  Laterality: N/A;  . EYE SURGERY    . KNEE ARTHROSCOPY Left   . KNEE ARTHROSCOPY WITH MEDIAL MENISECTOMY Right 02/13/2019   Procedure: KNEE ARTHROSCOPY WITH MEDIAL MENISECTOMY;  Surgeon: Thornton Park, MD;  Location: [facility];  Service: Orthopedics;  Laterality: Right;  . LACRIMAL DUCT PROBING W/ DACRYOPLASTY Bilateral   . SALPINGOOPHORECTOMY Left 03/12/2017   Procedure: SALPINGECTOMY;  Surgeon: Salvadore Dom, MD;  Location: [facility];  Service: Gynecology;  Laterality: Left;  . SHOULDER ARTHROSCOPY WITH OPEN ROTATOR  CUFF REPAIR Right 09/03/2014   Procedure: SHOULDER ARTHROSCOPY WITH OPEN ROTATOR CUFF REPAIR;  Surgeon: Thornton Park, MD;  Location: [facility];  Service: Orthopedics;  Laterality: Right;  . THYROIDECTOMY Right    hemi-thyroidectomy  . TOTAL KNEE ARTHROPLASTY Right 05/20/2019   Procedure: TOTAL KNEE ARTHROPLASTY;  Surgeon: Thornton Park, MD;  Location: [facility];  Service: Orthopedics;  Laterality: Right;  . TOTAL KNEE ARTHROPLASTY Left 12/23/2019   Procedure: Left TOTAL KNEE ARTHROPLASTY;  Surgeon: Thornton Park, MD;  Location: [facility];  Service: Orthopedics;  Laterality: Left;  Marland Kitchen VAGINAL HYSTERECTOMY N/A 03/12/2017   Procedure: HYSTERECTOMY VAGINAL;  Surgeon: Salvadore Dom, MD;  Location: [facility];  Service: Gynecology;  Laterality: N/A;    There were no vitals filed for this visit.   Subjective Assessment - 03/01/20 0915    Subjective Pt. entered PT with normalized gait pattern.  Pt. states she has minimal L knee discomfort of 3/10 pain with increase activity.  Pt. did 5 hours of walking/ shopping this past weekend with no issues reported.    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  Pt. received PT prior to/ after knee scope this year.  See PT notes.  Pt. known well to PT clinic after recent R TKA.    Limitations Standing;Walking;House hold activities    Patient Stated Goals Increase L knee ROM/ strength/ improve pain-free walking.  Currently in Pain? Yes    Pain Score 3     Pain Location Knee    Pain Orientation Left    Pain Descriptors / Indicators Aching           There.ex.:  Nustep L6 10 min. B UE/LE (no UE for last 2 minutes)- consistent cadence (warm-up/ reviewed goals)  Recip. Stairs 6x with no UE assist (maintaining midline position)  TG: bilateral knee flexion 20x/ heel raises 20x/ unilateral squats 10x each.  Tandem gait/ lateral walking on [employer] beam (no UE assist in //-bars).    1/2 bolster balance  (ankle stability)- no UE assist in //-bars  Supine knee to chest with ball/ SLR/ reassessment of L knee AROM and scar  Reviewed HEP/ hallway walking      PT Long Term Goals - 03/01/20 0926      PT LONG TERM GOAL #1   Title Pt. will increase FOTO to 54 to improve functional mobility.    Baseline initial FOTO: 18.  12/29: 58    Time 4    Period Weeks    Status Achieved    Target Date 02/28/20      PT LONG TERM GOAL #2   Title Pt. will increase L knee AROM (0 to >115 deg.) to improve independence with gait.    Baseline L knee AROM:  -7 to 84 deg.   12/29: 0-112 deg. (AROM).  L knee PROM flexion 115 deg. (pain limited).  1/31:  0-118 deg.    Time 4    Period Weeks    Status Achieved    Target Date 03/01/20      PT LONG TERM GOAL #3   Title Patient will demonstrate improved strength of BLE as evidenced by 1/2 increase in MMT for increased function at home and in the community.    Baseline L hip flexion/ abduction strength 4/5 MMT, L quad 3/5 MMT, L hamstring 3/5 MMT.  1/31: L LE strength grossly 5/5 MMT (no pain)    Time 4    Period Weeks    Status Achieved    Target Date 03/01/20      PT LONG TERM GOAL #4   Title Pt. able to ambulate with consistent 2-point gait pattern and use of SPC on level surfaces safely.    Baseline pt. currently amb. with RW.  12/29: pt. carrying SPC more than using it in with gait pattern.  1/31: Normalized gait on all surfaces    Time 4    Period Weeks    Status Partially Met    Target Date 03/01/20      PT LONG TERM GOAL #5   Title Pt. able to ambulate with consistent recip. pattern outside on grassy terrain with no issues to improve mobility at home.    Baseline Pt. walking around pool and in parking area of driveway.  Unable to walk in grass this morning due to rain.    Time 4    Period Weeks    Status Achieved    Target Date 03/01/20      PT LONG TERM GOAL #6   Title Pt. able to ascend/ descend 10 stairs with recip. pattern and no UE  assist to improve mobility.    Baseline handrails required.    Time 4    Period Weeks    Status Achieved    Target Date 03/01/20              Plan - 03/01/20 [phone]  Clinical Impression Statement Discharge from PT at this time.  Pt. has progressed well towards all PT goals.  Pt. is happy with her overall L knee progress and instructed to contact PT if any regression of symptoms or questions.  Good incision healing (all scabs have healed).  Pt. able to ascend/ descend 4 steps without UE assist and consistent recip. gait pattern.  Pt. will continue with HEP and daily walking.    Personal Factors and Comorbidities Profession;Past/Current Experience;Time since onset of injury/illness/exacerbation;Fitness;Comorbidity 3+;Behavior Pattern;Age    Comorbidities HTN, HLD, OA, sleep apnea, vertigo    Examination-Activity Limitations Squat;Lift;Stairs;Stand;Locomotion Level;Sit;Transfers;Sleep;Bend;Bed Mobility    Stability/Clinical Decision Making Evolving/Moderate complexity    Clinical Decision Making Moderate    Rehab Potential Good    PT Frequency 2x / week    PT Duration 4 weeks    PT Treatment/Interventions ADLs/Self Care Home Management;Cryotherapy;Electrical Stimulation;Moist Heat;Stair training;Gait training;Therapeutic activities;Functional mobility training;Therapeutic exercise;Balance training;Orthotic Fit/Training;Patient/family education;Neuromuscular re-education;Manual techniques;Dry needling;Passive range of motion;Scar mobilization;Taping;Joint Manipulations    PT Next Visit Plan Discharge visit    PT Home Exercise Plan [id number]    Consulted and Agree with Plan of Care Patient           Patient will benefit from skilled therapeutic intervention in order to improve the following deficits and impairments:  Abnormal gait,Decreased balance,Decreased endurance,Decreased mobility,Difficulty walking,Pain,Postural dysfunction,Decreased strength,Decreased activity tolerance,Decreased  range of motion,Improper body mechanics,Impaired flexibility,Hypomobility  Visit Diagnosis: S/P TKR (total knee replacement), left  Difficulty in walking, not elsewhere classified  Joint stiffness of knee, left  Muscle weakness (generalized)  Acute pain of left knee     Problem List Patient Active Problem List   Diagnosis Date Noted  . S/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, left 12/23/2019  . S/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, right 05/20/2019  . H/O total vaginal hysterectomy 03/12/2017  . Essential hypertension 03/08/2017  . Mixed dyslipidemia 03/08/2017  . Abnormal EKG 03/08/2017  . Preoperative cardiovascular examination 03/08/2017  . Allergic state 12/11/2013  . High cholesterol 12/11/2013  . History of depression 12/11/2013  . Hypothyroidism 12/11/2013  . Sleep apnea 12/11/2013   Pura Spice, PT, DPT # [phone] 03/01/2020, 9:46 AM  [facility] [address] [place], [place], [address] Phone: [phone]   Fax:  [phone]  Name: CAMELLE HENKELS MRN: [id number] Date of Birth: [birth year]

## 2020-03-03 ENCOUNTER — Ambulatory Visit: Payer: PRIVATE HEALTH INSURANCE | Admitting: Physical Therapy

## 2020-03-08 ENCOUNTER — Ambulatory Visit: Payer: PRIVATE HEALTH INSURANCE | Admitting: Physical Therapy

## 2020-03-10 ENCOUNTER — Encounter: Payer: Medicare Other | Admitting: Physical Therapy

## 2020-03-12 ENCOUNTER — Other Ambulatory Visit: Payer: Self-pay | Admitting: Internal Medicine

## 2020-03-15 ENCOUNTER — Encounter: Payer: Medicare Other | Admitting: Physical Therapy

## 2020-03-17 ENCOUNTER — Encounter: Payer: Medicare Other | Admitting: Physical Therapy

## 2020-03-17 ENCOUNTER — Telehealth: Payer: Self-pay | Admitting: *Deleted

## 2020-03-17 NOTE — Telephone Encounter (Signed)
Call returned to patient.  Patient request OV for "cystocele". Reports symptoms started 2 days ago, can feel a "bulge" in vagina. Reports urinary frequency and urgency and difficulty voiding at times. States if she repositions herself she can void. Denies dysuria, flank pain, fever/chills, N/V.  Offered OV for today, patient declined. OV scheduled for 03/18/20 at 1:50pm w/ Dr. Talbert Nan. Patient is aware to contact the office if unable to void or if any new symptoms develop.   Routing to provider for final review. Patient is agreeable to disposition. Will close encounter.

## 2020-03-18 ENCOUNTER — Encounter: Payer: Self-pay | Admitting: Obstetrics and Gynecology

## 2020-03-18 ENCOUNTER — Other Ambulatory Visit: Payer: Self-pay

## 2020-03-18 ENCOUNTER — Ambulatory Visit: Payer: Medicare Other | Admitting: Obstetrics and Gynecology

## 2020-03-18 VITALS — BP 130/72 | HR 77 | Ht 61.0 in | Wt 142.0 lb

## 2020-03-18 DIAGNOSIS — R3911 Hesitancy of micturition: Secondary | ICD-10-CM | POA: Diagnosis not present

## 2020-03-18 DIAGNOSIS — N8111 Cystocele, midline: Secondary | ICD-10-CM

## 2020-03-18 NOTE — Progress Notes (Signed)
GYNECOLOGY  VISIT   HPI: 70 y.o.   Married White or Caucasian Not Hispanic or Latino  female   G1P1001 with No LMP recorded. Patient has had a hysterectomy.   here for  Possible cystocele. Patient states her bladder is hanging out of her vagina.   In 2/19 the patient had a TVH, uterosacral ligament suspension and an A&P repair.  In the last month she has noticed her urine stream is not as strong. Voids well in first thing in the am, the rest of the day she has to reduce cystocele to void. She feels a bulge when she wipes/washes. Occasionally notices the bulge at other times. No heavy lifting or straining.    She had bilateral knee replacement in 2021.    GYNECOLOGIC HISTORY: No LMP recorded. Patient has had a hysterectomy. Contraception: post hysterectomy  Menopausal hormone therapy: yes        OB History    Gravida  1   Para  1   Term  1   Preterm      AB      Living  1     SAB      IAB      Ectopic      Multiple      Live Births  1              Patient Active Problem List   Diagnosis Date Noted  . S/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, left 12/23/2019  . S/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, right 05/20/2019  . H/O total vaginal hysterectomy 03/12/2017  . Essential hypertension 03/08/2017  . Mixed dyslipidemia 03/08/2017  . Abnormal EKG 03/08/2017  . Preoperative cardiovascular examination 03/08/2017  . Allergic state 12/11/2013  . High cholesterol 12/11/2013  . History of depression 12/11/2013  . Hypothyroidism 12/11/2013  . Sleep apnea 12/11/2013    Past Medical History:  Diagnosis Date  . Arthritis   . Hyperlipemia   . Hypertension   . Hypothyroidism   . Sleep apnea   . Vertigo     Past Surgical History:  Procedure Laterality Date  . ANTERIOR AND POSTERIOR REPAIR N/A 03/12/2017   Procedure: ANTERIOR (CYSTOCELE) AND POSTERIOR REPAIR (RECTOCELE);  Surgeon: Salvadore Dom, MD;  Location: [facility];   Service: Gynecology;  Laterality: N/A;  . CARPAL TUNNEL RELEASE Bilateral   . CHOLECYSTECTOMY    . CYSTOSCOPY N/A 03/12/2017   Procedure: CYSTOSCOPY;  Surgeon: Salvadore Dom, MD;  Location: [facility];  Service: Gynecology;  Laterality: N/A;  . EYE SURGERY    . KNEE ARTHROSCOPY Left   . KNEE ARTHROSCOPY WITH MEDIAL MENISECTOMY Right 02/13/2019   Procedure: KNEE ARTHROSCOPY WITH MEDIAL MENISECTOMY;  Surgeon: Thornton Park, MD;  Location: [facility];  Service: Orthopedics;  Laterality: Right;  . LACRIMAL DUCT PROBING W/ DACRYOPLASTY Bilateral   . SALPINGOOPHORECTOMY Left 03/12/2017   Procedure: SALPINGECTOMY;  Surgeon: Salvadore Dom, MD;  Location: [facility];  Service: Gynecology;  Laterality: Left;  . SHOULDER ARTHROSCOPY WITH OPEN ROTATOR CUFF REPAIR Right 09/03/2014   Procedure: SHOULDER ARTHROSCOPY WITH OPEN ROTATOR CUFF REPAIR;  Surgeon: Thornton Park, MD;  Location: [facility];  Service: Orthopedics;  Laterality: Right;  . THYROIDECTOMY Right    hemi-thyroidectomy  . TOTAL KNEE ARTHROPLASTY Right 05/20/2019   Procedure: TOTAL KNEE ARTHROPLASTY;  Surgeon: Thornton Park, MD;  Location: [facility];  Service: Orthopedics;  Laterality: Right;  . TOTAL KNEE ARTHROPLASTY Left 12/23/2019  Procedure: Left TOTAL KNEE ARTHROPLASTY;  Surgeon: Thornton Park, MD;  Location: [facility];  Service: Orthopedics;  Laterality: Left;  Marland Kitchen VAGINAL HYSTERECTOMY N/A 03/12/2017   Procedure: HYSTERECTOMY VAGINAL;  Surgeon: Salvadore Dom, MD;  Location: [facility];  Service: Gynecology;  Laterality: N/A;    Current Outpatient Medications  Medication Sig Dispense Refill  . acetaminophen (TYLENOL) 500 MG tablet Take 1,000 mg by mouth 2 (two) times daily as needed for moderate pain.     . celecoxib (CELEBREX) 200 MG capsule Take 200 mg by mouth daily.    Marland Kitchen conjugated estrogens (PREMARIN) vaginal cream 1/2 gram vaginally twice weekly (Patient taking  differently: Place 1 Applicatorful vaginally 2 (two) times a week.) 30 g 1  . docusate sodium (COLACE) 100 MG capsule Take 1 capsule (100 mg total) by mouth 2 (two) times daily. 10 capsule 0  . fexofenadine (ALLEGRA) 180 MG tablet Take 180 mg by mouth daily as needed for allergies.     . fluticasone (FLONASE) 50 MCG/ACT nasal spray Place 1 spray into both nostrils daily as needed for allergies.     Marland Kitchen levothyroxine (SYNTHROID) 88 MCG tablet Take 88 mcg by mouth daily before breakfast.    . lisinopril-hydrochlorothiazide (PRINZIDE,ZESTORETIC) 20-25 MG per tablet Take 1 tablet by mouth daily.    . rosuvastatin (CRESTOR) 5 MG tablet Take 5 mg by mouth daily.     . Scar Treatment Products (SCAR GEL EX) Apply 1 application topically daily.    . traMADol (ULTRAM) 50 MG tablet Take 50 mg by mouth every 6 (six) hours as needed.    . enoxaparin (LOVENOX) 40 MG/0.4ML injection Inject 0.4 mLs (40 mg total) into the skin daily for 14 days. 5.6 mL 0   No current facility-administered medications for this visit.     ALLERGIES: Codeine and Doxycycline  Family History  Problem Relation Age of Onset  . Diabetes type II Father   . CAD Father   . Alzheimer's disease Father   . Alzheimer's disease Mother   . Breast cancer Neg Hx     Social History   Socioeconomic History  . Marital status: Married    Spouse name: Not on file  . Number of children: Not on file  . Years of education: Not on file  . Highest education level: Not on file  Occupational History  . Not on file  Tobacco Use  . Smoking status: Never Smoker  . Smokeless tobacco: Never Used  Vaping Use  . Vaping Use: Never used  Substance and Sexual Activity  . Alcohol use: Not Currently    Alcohol/week: 3.0 - 4.0 standard drinks    Types: 3 - 4 Standard drinks or equivalent per week  . Drug use: No  . Sexual activity: Yes    Partners: Male    Birth control/protection: Post-menopausal, Surgical  Other Topics Concern  . Not on file   Social History Narrative  . Not on file   Social Determinants of Health   Financial Resource Strain: Not on file  Food Insecurity: Not on file  Transportation Needs: Not on file  Physical Activity: Not on file  Stress: Not on file  Social Connections: Not on file  Intimate Partner Violence: Not on file    Review of Systems  Genitourinary: Positive for frequency.    PHYSICAL EXAMINATION:    BP 130/72   Pulse 77   Ht 5\' 1"  (1.549 m)   Wt 142 lb (64.4 kg)   SpO2  100%   BMI 26.83 kg/m     General appearance: alert, cooperative and appears stated age  Pelvic: External genitalia:  no lesions              Urethra:  normal appearing urethra with no masses, tenderness or lesions              Bartholins and Skenes: normal                 Vagina: mild vaginal atrophy, small grade cystocele noted with standing and valsalva. Vaginal cuff well supported, no rectocele noted.               Cervix: absent              Bimanual Exam:  Uterus:  uterus absent              Adnexa: no mass, fullness, tenderness               Fitted with a #3 ring pessary with support, it fell out when she was trying to void. Fitted with a #4 ring pessary with support, a #5 ring pessary with support and a #3 cube pessary. They all fell out. The #5 ring was uncomfortable.   Chaperone was present for exam.  1. Midline cystocele Prior TVH, uterosacral ligament suspension and A&P repair in 2/19. Now with recurrent symptomatic cystocele. Unable to fit with a ring pessary. #3 cube too small. She will consider her options of trying more pessaries (would start with the #4 cube), vs surgery, vs living with it.  F/U for her annual exam in the next few months. Will discuss further at that visit.   2. Urinary hesitancy First thing in the am she voids fine. Can reduce her cystocele and void fine. She will continue to reduce her cystocele as needed  In addition to the pessary fitting, ~15 minutes was spent  discussing prolapse and reviewing options of treatment.

## 2020-03-18 NOTE — Patient Instructions (Signed)

## 2020-03-22 ENCOUNTER — Encounter: Payer: Medicare Other | Admitting: Physical Therapy

## 2020-03-24 ENCOUNTER — Encounter: Payer: Medicare Other | Admitting: Physical Therapy

## 2020-03-25 ENCOUNTER — Telehealth: Payer: Self-pay | Admitting: *Deleted

## 2020-03-25 DIAGNOSIS — N8111 Cystocele, midline: Secondary | ICD-10-CM

## 2020-03-25 DIAGNOSIS — R3911 Hesitancy of micturition: Secondary | ICD-10-CM

## 2020-03-25 NOTE — Telephone Encounter (Signed)
Patient called had office visit on 03/18/18 for midline cystocele patient said you mentioned a urogyn referral. Patient said she would like to proceed with referral  Referral placed, they will call patient to schedule. Just FYI

## 2020-03-29 ENCOUNTER — Encounter: Payer: Medicare Other | Admitting: Physical Therapy

## 2020-04-01 NOTE — Telephone Encounter (Signed)
Patient scheduled on 04/06/20 @ 3:20pm

## 2020-04-02 ENCOUNTER — Other Ambulatory Visit: Payer: Self-pay

## 2020-04-02 ENCOUNTER — Encounter: Payer: Self-pay | Admitting: Nurse Practitioner

## 2020-04-02 ENCOUNTER — Ambulatory Visit: Payer: Medicare Other | Admitting: Nurse Practitioner

## 2020-04-02 VITALS — BP 120/70 | HR 70 | Temp 98.2°F | Resp 16 | Wt 141.0 lb

## 2020-04-02 DIAGNOSIS — N3090 Cystitis, unspecified without hematuria: Secondary | ICD-10-CM

## 2020-04-02 DIAGNOSIS — R350 Frequency of micturition: Secondary | ICD-10-CM | POA: Diagnosis not present

## 2020-04-02 MED ORDER — SULFAMETHOXAZOLE-TRIMETHOPRIM 800-160 MG PO TABS: ORAL_TABLET | ORAL | 0 refills | Status: DC

## 2020-04-02 NOTE — Patient Instructions (Addendum)
Urinary Tract Infection, Adult A urinary tract infection (UTI) is an infection of any part of the urinary tract. The urinary tract includes:  The kidneys.  The ureters.  The bladder.  The urethra. These organs make, store, and get rid of pee (urine) in the body. What are the causes? This infection is caused by germs (bacteria) in your genital area. These germs grow and cause swelling (inflammation) of your urinary tract. What increases the risk? The following factors may make you more likely to develop this condition:  Using a small, thin tube (catheter) to drain pee.  Not being able to control when you pee or poop (incontinence).  Being female. If you are female, these things can increase the risk: ? Using these methods to prevent pregnancy:  A medicine that kills sperm (spermicide).  A device that blocks sperm (diaphragm). ? Having low levels of a female hormone (estrogen). ? Being pregnant. You are more likely to develop this condition if:  You have genes that add to your risk.  You are sexually active.  You take antibiotic medicines.  You have trouble peeing because of: ? A prostate that is bigger than normal, if you are female. ? A blockage in the part of your body that drains pee from the bladder. ? A kidney stone. ? A nerve condition that affects your bladder. ? Not getting enough to drink. ? Not peeing often enough.  You have other conditions, such as: ? Diabetes. ? A weak disease-fighting system (immune system). ? Sickle cell disease. ? Gout. ? Injury of the spine. What are the signs or symptoms? Symptoms of this condition include:  Needing to pee right away.  Peeing small amounts often.  Pain or burning when peeing.  Blood in the pee.  Pee that smells bad or not like normal.  Trouble peeing.  Pee that is cloudy.  Fluid coming from the vagina, if you are female.  Pain in the belly or lower back. Other symptoms include:  Vomiting.  Not  feeling hungry.  Feeling mixed up (confused). This may be the first symptom in older adults.  Being tired and grouchy (irritable).  A fever.  Watery poop (diarrhea). How is this treated?  Taking antibiotic medicine.  Taking other medicines.  Drinking enough water. In some cases, you may need to see a specialist. Follow these instructions at home: Medicines  Take over-the-counter and prescription medicines only as told by your doctor.  If you were prescribed an antibiotic medicine, take it as told by your doctor. Do not stop taking it even if you start to feel better. General instructions  Make sure you: ? Pee until your bladder is empty. ? Do not hold pee for a long time. ? Empty your bladder after sex. ? Wipe from front to back after peeing or pooping if you are a female. Use each tissue one time when you wipe.  Drink enough fluid to keep your pee pale yellow.  Keep all follow-up visits.   Contact a doctor if:  You do not get better after 1-2 days.  Your symptoms go away and then come back. Get help right away if:  You have very bad back pain.  You have very bad pain in your lower belly.  You have a fever.  You have chills.  You feeling like you will vomit or you vomit. Summary  A urinary tract infection (UTI) is an infection of any part of the urinary tract.  This condition is caused by   germs in your genital area.  There are many risk factors for a UTI.  Treatment includes antibiotic medicines.  Drink enough fluid to keep your pee pale yellow. This information is not intended to replace advice given to you by your health care provider. Make sure you discuss any questions you have with your health care provider. Document Revised: 08/29/2019 Document Reviewed: 08/29/2019 Elsevier Patient Education  [employer].   About Cystocele  Overview  The pelvic organs, including the bladder, are normally supported by pelvic floor muscles and  ligaments.  When these muscles and ligaments are stretched, weakened or torn, the wall between the bladder and the vagina sags or herniates causing a prolapse, sometimes called a cystocele.  This condition may cause discomfort and problems with emptying the bladder.  It can be present in various stages.  Some people are not aware of the changes.  Others may notice changes at the vaginal opening or a feeling of the bladder dropping outside the body.  Causes of a Cystocele  A cystocele is usually caused by muscle straining or stretching during childbirth.  In addition, cystocele is more common after menopause, because the hormone estrogen helps keep the elastic tissues around the pelvic organs strong.  A cystocele is more likely to occur when levels of estrogen decrease.  Other causes include: heavy lifting, chronic coughing, previous pelvic surgery and obesity.  Symptoms  A bladder that has dropped from its normal position may cause: unwanted urine leakage (stress incontinence), frequent urination or urge to urinate, incomplete emptying of the bladder (not feeling bladder relief after emptying), pain or discomfort in the vagina, pelvis, groin, lower back or lower abdomen and frequent urinary tract infections.  Mild cases may not cause any symptoms.  Treatment Options  Pelvic floor (Kegel) exercises:  Strength training the muscles in your genital area  Behavioral changes: Treating and preventing constipation, taking time to empty your bladder properly, learning to lift properly and/or avoid heavy lifting when possible, stopping smoking, avoiding weight gain and treating a chronic cough or bronchitis.  A pessary: A vaginal support device is sometimes used to help pelvic support caused by muscle and ligament changes.  Surgery: Surgical repair may be necessary if symptoms cannot be managed with exercise, behavioral changes and a pessary.  Surgery is usually considered for severe cases.   2007,  [employer]

## 2020-04-02 NOTE — Progress Notes (Signed)
GYNECOLOGY  VISIT  CC:   Thinks she has a bladder infection  HPI: 70 y.o. G74P1001 Married White or Caucasian female here for uti. Burning with urine burning, took azo. Started with pain and burning, now hurts to sit. Thinks symptoms started a couple weeks ago and got worse. Still has to put fingers in vagina and push against bladder in order to urinate completely. Saw Dr. Talbert Nan 03/18/2020 for cystocele, Has plans to see Uro gyn 04/06/2020 S/p TVH, uterosacral ligament suspension and A&P repair Feb 2019    GYNECOLOGIC HISTORY: No LMP recorded. Patient has had a hysterectomy. Contraception: hysterectomy Menopausal hormone therapy: premarin vaginal cream  Patient Active Problem List   Diagnosis Date Noted  . S/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, left 12/23/2019  . S/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, right 05/20/2019  . H/O total vaginal hysterectomy 03/12/2017  . Essential hypertension 03/08/2017  . Mixed dyslipidemia 03/08/2017  . Abnormal EKG 03/08/2017  . Preoperative cardiovascular examination 03/08/2017  . Allergic state 12/11/2013  . High cholesterol 12/11/2013  . History of depression 12/11/2013  . Hypothyroidism 12/11/2013  . Sleep apnea 12/11/2013    Past Medical History:  Diagnosis Date  . Arthritis   . Hyperlipemia   . Hypertension   . Hypothyroidism   . Sleep apnea   . Vertigo     Past Surgical History:  Procedure Laterality Date  . ANTERIOR AND POSTERIOR REPAIR N/A 03/12/2017   Procedure: ANTERIOR (CYSTOCELE) AND POSTERIOR REPAIR (RECTOCELE);  Surgeon: Salvadore Dom, MD;  Location[facility];  Service: Gynecology;  Laterality: N/A;  . CARPAL TUNNEL RELEASE Bilateral   . CHOLECYSTECTOMY    . CYSTOSCOPY N/A 03/12/2017   Procedure: CYSTOSCOPY;  Surgeon: Salvadore Dom, MD;  Location: [facility];  Service: Gynecology;  Laterality: N/A;  . EYE SURGERY    . KNEE ARTHROSCOPY Left   . KNEE ARTHROSCOPY  WITH MEDIAL MENISECTOMY Right 02/13/2019   Procedure: KNEE ARTHROSCOPY WITH MEDIAL MENISECTOMY;  Surgeon: Thornton Park, MD;  Location: [facility];  Service: Orthopedics;  Laterality: Right;  . LACRIMAL DUCT PROBING W/ DACRYOPLASTY Bilateral   . SALPINGOOPHORECTOMY Left 03/12/2017   Procedure: SALPINGECTOMY;  Surgeon: Salvadore Dom, MD;  Location[facility];  Service: Gynecology;  Laterality: Left;  . SHOULDER ARTHROSCOPY WITH OPEN ROTATOR CUFF REPAIR Right 09/03/2014   Procedure: SHOULDER ARTHROSCOPY WITH OPEN ROTATOR CUFF REPAIR;  Surgeon: Thornton Park, MD;  Location: [facility];  Service: Orthopedics;  Laterality: Right;  . THYROIDECTOMY Right    hemi-thyroidectomy  . TOTAL KNEE ARTHROPLASTY Right 05/20/2019   Procedure: TOTAL KNEE ARTHROPLASTY;  Surgeon: Thornton Park, MD;  Location: [facility];  Service: Orthopedics;  Laterality: Right;  . TOTAL KNEE ARTHROPLASTY Left 12/23/2019   Procedure: Left TOTAL KNEE ARTHROPLASTY;  Surgeon: Thornton Park, MD;  Location: [facility];  Service: Orthopedics;  Laterality: Left;  Marland Kitchen VAGINAL HYSTERECTOMY N/A 03/12/2017   Procedure: HYSTERECTOMY VAGINAL;  Surgeon: Salvadore Dom, MD;  Location: [facility];  Service: Gynecology;  Laterality: N/A;    MEDS:   Current Outpatient Medications on File Prior to Visit  Medication Sig Dispense Refill  . acetaminophen (TYLENOL) 500 MG tablet Take 1,000 mg by mouth 2 (two) times daily as needed for moderate pain.     . celecoxib (CELEBREX) 200 MG capsule Take 200 mg by mouth daily.    Marland Kitchen conjugated estrogens (PREMARIN) vaginal cream 1/2 gram vaginally twice weekly (Patient taking differently: Place 1  Applicatorful vaginally 2 (two) times a week.) 30 g 1  . fexofenadine (ALLEGRA) 180 MG tablet Take 180 mg by mouth daily as needed for allergies.     . fluticasone (FLONASE) 50 MCG/ACT nasal spray Place 1 spray into both nostrils daily as needed for allergies.     Marland Kitchen  levothyroxine (SYNTHROID) 88 MCG tablet Take 88 mcg by mouth daily before breakfast.    . lisinopril-hydrochlorothiazide (PRINZIDE,ZESTORETIC) 20-25 MG per tablet Take 1 tablet by mouth daily.    . rosuvastatin (CRESTOR) 5 MG tablet Take 5 mg by mouth daily.     . Scar Treatment Products (SCAR GEL EX) Apply 1 application topically daily.    . traMADol (ULTRAM) 50 MG tablet Take 50 mg by mouth every 6 (six) hours as needed.     No current facility-administered medications on file prior to visit.    ALLERGIES: Codeine and Doxycycline  Family History  Problem Relation Age of Onset  . Diabetes type II Father   . CAD Father   . Alzheimer's disease Father   . Alzheimer's disease Mother   . Breast cancer Neg Hx     Review of Systems  Constitutional: Negative.   HENT: Negative.   Eyes: Negative.   Respiratory: Negative.   Cardiovascular: Negative.   Gastrointestinal: Negative.   Endocrine: Negative.   Genitourinary: Positive for frequency.       Pain with urination, cystocele  Musculoskeletal: Negative.   Skin: Negative.   Allergic/Immunologic: Negative.   Neurological: Negative.   Hematological: Negative.   Psychiatric/Behavioral: Negative.     PHYSICAL EXAMINATION:    BP 120/70   Pulse 70   Temp 98.2 F (36.8 C) (Oral)   Resp 16   Wt 141 lb (64 kg)   BMI 26.64 kg/m     General appearance: alert, cooperative, no acute distress  Pelvic: External genitalia:  no lesions              Urethra:  normal appearing urethra with no masses, tenderness or lesions              Bartholins and Skenes: normal                 Vagina: atrophy appropriate for age, mild cystocele, normal appearing discharge  Bladder does not appear to be overdistended               Assessment/Plan: Urinary frequency - Plan: Urinalysis,Complete w/RFL Culture  Cystitis - Plan: sulfamethoxazole-trimethoprim (BACTRIM DS) 800-160 MG tablet  Appointment next week with uro gynecology

## 2020-04-02 NOTE — Progress Notes (Addendum)
[facility] New Patient Evaluation and Consultation  Referring Provider: Salvadore Dom, MD PCP: Baxter Hire, MD Date of Service: 04/06/2020  SUBJECTIVE Chief Complaint: New Patient (Initial Visit) - vaginal bulge  History of Present Illness: Jordan Palmer is a 70 y.o. White or Caucasian female seen in consultation at the request of Dr. Talbert Nan for evaluation of prolapse.    Review of records from Dr Talbert Nan significant for: Has a history of TVH, uterosacral suspension and A&P repair in 03/2017. Now with recurrent symptomatic cystocele. Has tried some pessaries without success (#3, 4, and 5 ring with support and #3 cube).  Urinary Symptoms: Leaks urine with without awareness Leaks several time(s) per days- she wears a pad and it is wet.  Pad use: 1 liners/ mini-pads per day.   She is bothered by her UI symptoms.  Day time voids every few hours.  Nocturia: 1 times per night to void. Voiding dysfunction: she does not empty her bladder well.  does not use a catheter to empty bladder.  When urinating, she feels to push on her belly or vagina to empty bladder Drinks: 1 cup coffee in AM, water with flavor (grape) per day. Stopped drinking Mt Dew  UTIs: 1 UTI's in the last year- just diagnosed, is currently finishing antibiotics.  Denies history of blood in urine and kidney or bladder stones  Pelvic Organ Prolapse Symptoms:                  She admits to a feeling of a bulge the vaginal area. It has been present for 2 months.  She admits to seeing a bulge.  This bulge is bothersome.  Bowel Symptom: Bowel movements: 1 time(s) per day Stool consistency: soft  Straining: no.  Splinting: no.  Incomplete evacuation: no.  She denies accidental bowel leakage / fecal incontinence Bowel regimen: equate vegetable laxative Last colonoscopy: Date 5-6 years ago, Results negative  Sexual Function Sexually active: yes, but not recently due to the prolapse Pain with  sex: has discomfort due to prolapse  Pelvic Pain Denies pelvic pain   Past Medical History:  Past Medical History:  Diagnosis Date  . Arthritis   . Hyperlipemia   . Hypertension   . Hypothyroidism   . Sleep apnea   . Vertigo      Past Surgical History:   Past Surgical History:  Procedure Laterality Date  . ANTERIOR AND POSTERIOR REPAIR N/A 03/12/2017   Procedure: ANTERIOR (CYSTOCELE) AND POSTERIOR REPAIR (RECTOCELE);  Surgeon: Salvadore Dom, MD;  Location: [facility];  Service: Gynecology;  Laterality: N/A;  . CARPAL TUNNEL RELEASE Bilateral   . CHOLECYSTECTOMY    . CYSTOSCOPY N/A 03/12/2017   Procedure: CYSTOSCOPY;  Surgeon: Salvadore Dom, MD;  Location: [facility];  Service: Gynecology;  Laterality: N/A;  . EYE SURGERY    . KNEE ARTHROSCOPY Left   . KNEE ARTHROSCOPY WITH MEDIAL MENISECTOMY Right 02/13/2019   Procedure: KNEE ARTHROSCOPY WITH MEDIAL MENISECTOMY;  Surgeon: Thornton Park, MD;  Location: [facility];  Service: Orthopedics;  Laterality: Right;  . LACRIMAL DUCT PROBING W/ DACRYOPLASTY Bilateral   . SALPINGOOPHORECTOMY Left 03/12/2017   Procedure: SALPINGECTOMY;  Surgeon: Salvadore Dom, MD;  Location: [facility];  Service: Gynecology;  Laterality: Left;  . SHOULDER ARTHROSCOPY WITH OPEN ROTATOR CUFF REPAIR Right 09/03/2014   Procedure: SHOULDER ARTHROSCOPY WITH OPEN ROTATOR CUFF REPAIR;  Surgeon: Thornton Park, MD;  Location: [facility];  Service: Orthopedics;  Laterality: Right;  . THYROIDECTOMY Right    hemi-thyroidectomy  . TOTAL KNEE ARTHROPLASTY Right 05/20/2019   Procedure: TOTAL KNEE ARTHROPLASTY;  Surgeon: Thornton Park, MD;  Location: [facility];  Service: Orthopedics;  Laterality: Right;  . TOTAL KNEE ARTHROPLASTY Left 12/23/2019   Procedure: Left TOTAL KNEE ARTHROPLASTY;  Surgeon: Thornton Park, MD;  Location: [facility];  Service: Orthopedics;  Laterality: Left;  Marland Kitchen VAGINAL HYSTERECTOMY N/A  03/12/2017   Procedure: HYSTERECTOMY VAGINAL;  Surgeon: Salvadore Dom, MD;  Location: [facility];  Service: Gynecology;  Laterality: N/A;     Past OB/GYN History: OB History  Gravida Para Term Preterm AB Living  1 1 1     1   SAB IAB Ectopic Multiple Live Births          1    # Outcome Date GA Lbr Len/2nd Weight Sex Delivery Anes PTL Lv  1 Term      Vag-Spont   LIV   Menopausal: Yes S/p hysterectomy   Medications: She has a current medication list which includes the following prescription(s): acetaminophen, celecoxib, premarin, levothyroxine, lisinopril-hydrochlorothiazide, rosuvastatin, scar treatment products, sulfamethoxazole-trimethoprim, fexofenadine, fluticasone, and tramadol.   Allergies: Patient is allergic to codeine and doxycycline.   Social History:  Social History   Tobacco Use  . Smoking status: Never Smoker  . Smokeless tobacco: Never Used  Vaping Use  . Vaping Use: Never used  Substance Use Topics  . Alcohol use: Not Currently  . Drug use: No    Relationship status: married She lives with husband.   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 Regular exercise: No, but stays active  Family History:   Family History  Problem Relation Age of Onset  . Diabetes type II Father   . CAD Father   . Alzheimer's disease Father   . Alzheimer's disease Mother   . Breast cancer Neg Hx      Review of Systems: Review of Systems  Constitutional: Negative for fever, malaise/fatigue and weight loss.  Respiratory: Negative for cough, shortness of breath and wheezing.   Cardiovascular: Negative for chest pain, palpitations and leg swelling.  Gastrointestinal: Negative for abdominal pain and blood in stool.  Genitourinary: Negative for dysuria.  Musculoskeletal: Negative for myalgias.  Skin: Negative for rash.  Neurological: Negative for dizziness and headaches.  Endo/Heme/Allergies: Does not bruise/bleed easily.  Psychiatric/Behavioral:  Negative for depression. The patient is not nervous/anxious.      OBJECTIVE Physical Exam: Vitals:   04/06/20 1528  BP: 121/75  Pulse: 73  Weight: 141 lb (64 kg)    Physical Exam Constitutional:      General: She is not in acute distress. Pulmonary:     Effort: Pulmonary effort is normal.  Abdominal:     General: There is no distension.     Palpations: Abdomen is soft.     Tenderness: There is no abdominal tenderness. There is no rebound.     Comments: Laparoscopic incisions present  Musculoskeletal:        General: No swelling. Normal range of motion.  Skin:    General: Skin is warm and dry.     Findings: No rash.  Neurological:     Mental Status: She is alert and oriented to person, place, and time.  Psychiatric:        Mood and Affect: Mood normal.        Behavior: Behavior normal.     GU / Detailed Urogynecologic Evaluation:  Pelvic Exam: Normal external  female genitalia; Bartholin's and Skene's glands normal in appearance; urethral meatus normal in appearance, no urethral masses or discharge.   CST: negative  s/p hysterectomy: Speculum exam reveals normal vaginal mucosa with  atrophy and normal vaginal cuff.  Adnexa no mass, fullness, tenderness.    Pelvic floor strength I/V  Pelvic floor musculature: Right levator non-tender, Right obturator non-tender, Left levator non-tender, Left obturator non-tender  POP-Q:   POP-Q  0                                            Aa   0                                           Ba  -6                                              C   3.5                                            Gh  3                                            Pb  7                                            tvl   -2                                            Ap  -2                                            Bp                                                 D     Rectal Exam:  Normal external rectum  Post-Void Residual (PVR) by  Bladder Scan: In order to evaluate bladder emptying, we discussed obtaining a postvoid residual and she agreed to this procedure.  Procedure: The ultrasound unit was placed on the patient's abdomen in the suprapubic region after the patient had voided. A PVR of 13 ml was obtained by bladder scan.  Laboratory Results: @ENCLABS @   I visualized the urine specimen, noting the specimen to be clear yellow  ASSESSMENT AND PLAN Ms. Anastas is a 70 y.o. with:  1. Prolapse of anterior vaginal wall   2. Mixed  incontinence   3. Urinary frequency    1. Stage II anterior, Stage I posterior, Stage I apical prolapse   For treatment of pelvic organ prolapse, we discussed options for management including expectant management, conservative management, and surgical management, such as Kegels, a pessary, pelvic floor physical therapy, and specific surgical procedures. - She is potentially interested in surgery (anterior repair) but will consider her options and let us know. IUGA handout provided about the surgery. She will need urodynamic testing prior to scheduling surgery.   2. Mixed incontinence For treatment of stress urinary incontinence,  non-surgical options include expectant management, weight loss, physical therapy, as well as a pessary.  Surgical options include a midurethral sling, Burch urethropexy, and transurethral injection of a bulking agent. We discussed the symptoms of overactive bladder (OAB), which include urinary urgency, urinary frequency, nocturia, with or without urge incontinence.  While we do not know the exact etiology of OAB, several treatment options exist. We discussed management including behavioral therapy (decreasing bladder irritants, urge suppression strategies, timed voids, bladder retraining), physical therapy, medication.  - Currently unclear which type of incontinence is predominant. We discussed reducing bladder irritants (artificial sweeteners), although she has cut back on  drinking soda and has not seen an improvement.  - She would be interested in a sling if she decides to have surgery.  - If she is interested in proceeding with surgery, then we can plan for urodynamic testing to better qualify her leakage symptoms.   3. Urinary frequency - improving at night with recent treatment of UTI - No evidence of UTI on POC urine today.   She will call us once she decides on treatment course.     Jaquita Folds, MD   Medical Decision Making:  - Reviewed/ ordered a clinical laboratory test - Review and summation of prior records  Addendum:  For vaginal atrophy, patient is requesting a refill of her premarin cream. Refill sent to her pharmacy- 0.5g twice a week.

## 2020-04-05 LAB — URINALYSIS, COMPLETE W/RFL CULTURE
Bilirubin Urine: NEGATIVE
Hyaline Cast: NONE SEEN /LPF
Nitrites, Initial: POSITIVE — AB
Specific Gravity, Urine: 1.015 (ref 1.001–1.03)
WBC, UA: >=60 /HPF — AB (ref 0–5)
pH: 5 (ref 5.0–8.0)

## 2020-04-05 LAB — URINE CULTURE
MICRO NUMBER:: 11608772
SPECIMEN QUALITY:: ADEQUATE

## 2020-04-05 LAB — CULTURE INDICATED

## 2020-04-06 ENCOUNTER — Other Ambulatory Visit: Payer: Self-pay

## 2020-04-06 ENCOUNTER — Encounter: Payer: Self-pay | Admitting: Obstetrics and Gynecology

## 2020-04-06 ENCOUNTER — Ambulatory Visit (INDEPENDENT_AMBULATORY_CARE_PROVIDER_SITE_OTHER): Payer: Medicare Other | Admitting: Obstetrics and Gynecology

## 2020-04-06 VITALS — BP 121/75 | HR 73 | Wt 141.0 lb

## 2020-04-06 DIAGNOSIS — N952 Postmenopausal atrophic vaginitis: Secondary | ICD-10-CM | POA: Diagnosis not present

## 2020-04-06 DIAGNOSIS — R350 Frequency of micturition: Secondary | ICD-10-CM | POA: Diagnosis not present

## 2020-04-06 DIAGNOSIS — N8110 Cystocele, unspecified: Secondary | ICD-10-CM | POA: Diagnosis not present

## 2020-04-06 DIAGNOSIS — N3946 Mixed incontinence: Secondary | ICD-10-CM

## 2020-04-06 LAB — POCT URINALYSIS DIPSTICK
Appearance: NORMAL
Bilirubin, UA: NEGATIVE
Blood, UA: NEGATIVE
Glucose, UA: NEGATIVE
Ketones, UA: NEGATIVE
Leukocytes, UA: NEGATIVE
Nitrite, UA: NEGATIVE
Protein, UA: NEGATIVE
Spec Grav, UA: 1.02 (ref 1.010–1.025)
Urobilinogen, UA: 0.2 E.U./dL
pH, UA: 5 (ref 5.0–8.0)

## 2020-04-06 NOTE — Patient Instructions (Signed)
You have a stage 2 (out of 4) prolapse.  We discussed the fact that it is not life threatening but there are several treatment options. For treatment of pelvic organ prolapse, we discussed options for management including expectant management, conservative management, and surgical management, such as Kegels, a pessary, pelvic floor physical therapy, and specific surgical procedures.     If you are interested in surgery, then you will need urodynamic testing.   URODYNAMICS (UDS) TEST INFORMATION  IMPORTANT: Please try to arrive with a comfortably full bladder!   What is UDS? Marland Kitchen Urodynamics is a bladder test used to evaluate how your bladder and urethra (tube you urinate out of) work to help find out the cause of your bladder symptoms and evaluate your bladder function in order to make the best treatment plan for you.   What to expect? . A nurse will perform the test and will be with you during the entire exam. . First we will have to empty your bladder on a special toilet.  After you have emptied your bladder, very small catheters (plastic tubing) will be placed into your bladder and into your vagina (or rectum). These special small catheters measure pressure to help measure your bladder function.  Your bladder will be gently filled with water and you will be asked to cough and strain at several different points during the test.   . You will then be asked to empty your bladder in the special toilet with the catheters in place. Most patients can urinate (pee) easily with the catheters in place since the catheters are so small. . In total this procedure lasts about 45 minutes to 1 hour.  . After your test is completed, you will return (or possibly be seen the same day) to review the results, talk about treatment options and make a plan moving forward.

## 2020-04-07 MED ORDER — ESTROGENS, CONJUGATED 0.625 MG/GM VA CREA: 0.5000 g | TOPICAL_CREAM | VAGINAL | 11 refills | Status: DC

## 2020-04-07 NOTE — Addendum Note (Signed)
Addended by: Jaquita Folds on: 04/07/2020 08:32 AM   Modules accepted: Orders

## 2020-04-15 NOTE — Progress Notes (Signed)
[facility] Urodynamics Procedure  Referring Physician: Baxter Hire, MD Date of Procedure: 04/23/2020  Jordan Palmer is a 70 y.o. female who presents for urodynamic evaluation. Indication(s) for study: mixed incontinence  Vital Signs: BP 117/75   Pulse 66   Ht 5\' 1"  (1.549 m)   Wt 141 lb (64 kg)   BMI 26.64 kg/m   Laboratory Results: A catheterized urine specimen revealed:  POC urine: negative  Voiding Diary: Not performed  Procedure Timeout:  The correct patient was verified and the correct procedure was verified. The patient was in the correct position and safety precautions were reviewed based on at the patient's history.  Urodynamic Procedure A 79F dual lumen urodynamics catheter was placed under sterile conditions into the patient's bladder. A 79F catheter was placed into the rectum in order to measure abdominal pressure. EMG patches were placed in the appropriate position.  All connections were confirmed and calibrations/adjusted made. Saline was instilled into the bladder through the dual lumen catheters.  Cough/valsalva pressures were measured periodically during filling.  Patient was allowed to void.  The bladder was then emptied of its residual.  UROFLOW: Revealed a Qmax of 3.9 mL/sec.  She voided 40 mL and had a residual of 35 mL.  It was a interrupted pattern and represented normal habits though interpretation limited due to low voided volume.  CMG: This was performed with sterile water in the sitting position at a fill rate of 30 mL/min.    First sensation of fullness was 114 mLs,  First urge was 194 mLs,  Strong urge was 340 mLs and  Capacity was 392 mLs  Stress incontinence was demonstrated Lowest positive Barrier CLPP was 80 cmH20 at 393 ml. Highest negative Barrier VLPP was 74 cmH20 at 199 ml.  Detrusor function was normal, with no phasic contractions seen.   Compliance:  normal. End fill detrusor pressure was 8cmH20.  Calculated  compliance was 1101mL/cmH20  UPP: MUCP with/ without barrier reduction was 49 cm of water.    MICTURITION STUDY: Voiding was performed with reduction using scopettes in the sitting position.  Pdet at Qmax was 26 cm of water.  Qmax was 19.2 mL/sec.  It was a intermittent pattern.  She voided 362 mL and had a residual of 60 mL.  It was a volitional void, sustained detrusor contraction was present and abdominal straining was not present  EMG: This was performed with patches.  She had voluntary contractions, recruitment with fill was not present and urethral sphincter was relaxed with void.  The details of the procedure with the study tracings have been scanned into EPIC.   Urodynamic Impression:  1. Sensation was normal; capacity was normal 2. Stress Incontinence was demonstrated at normal pressures; 3. Detrusor Overactivity was not demonstrated. 4. Emptying was normal with a normal PVR, a sustained detrusor contraction present,  abdominal straining not present, normal urethral sphincter activity on EMG.  Plan: - The patient will follow up  to discuss the findings and treatment options.    Jaquita Folds, MD

## 2020-04-23 ENCOUNTER — Ambulatory Visit (INDEPENDENT_AMBULATORY_CARE_PROVIDER_SITE_OTHER): Payer: Medicare Other | Admitting: Obstetrics and Gynecology

## 2020-04-23 ENCOUNTER — Other Ambulatory Visit: Payer: Self-pay

## 2020-04-23 ENCOUNTER — Encounter: Payer: Self-pay | Admitting: Obstetrics and Gynecology

## 2020-04-23 VITALS — BP 117/75 | HR 66 | Ht 61.0 in | Wt 141.0 lb

## 2020-04-23 DIAGNOSIS — N393 Stress incontinence (female) (male): Secondary | ICD-10-CM

## 2020-04-23 NOTE — Patient Instructions (Signed)
Taking Care of Yourself after Urodynamics, Cystoscopy, Coaptite Injection, or Botox Injection   Drink plenty of water for a day or two following your procedure. Try to have about 8 ounces (one cup) at a time, and do this 6 times or more per day unless you have fluid restrictitons AVOID irritative beverages such as coffee, tea, soda, alcoholic or citrus drinks for a day or two, as this may cause burning with urination.  For the first 1-2 days after the procedure, your urine may be pink or red in color. You may have some blood in your urine as a normal side effect of the procedure. Large amounts of bleeding or difficulty urinating are NOT normal. Call the nurse line if this happens or go to the nearest Emergency Room if the bleeding is heavy or you cannot urinate at all and it is after hours.  You may experience some discomfort or a burning sensation with urination after having this procedure. You can use over the counter Azo or pyridium to help with burning and follow the instructions on the packaging. If it does not improve within 1-2 days, or other symptoms appear (fever, chills, or difficulty urinating) call the office to speak to a nurse.  You may return to normal daily activities such as work, school, driving, exercising and housework on the day of the procedure. If your doctor gave you a prescription, take it as ordered.  If you need a return appointment, the front desk staff will arrange it when you check out.   

## 2020-04-26 NOTE — Progress Notes (Signed)
[facility] Return Visit  SUBJECTIVE  History of Present Illness: Jordan Palmer is a 70 y.o. female seen in follow-up after urodynamic testing. She is interested in proceeding with surgery.   Urodynamic Impression:  1. Sensation was normal; capacity was normal 2. Stress Incontinence was demonstrated at normal pressures; 3. Detrusor Overactivity was not demonstrated. 4. Emptying was normal with a normal PVR, a sustained detrusor contraction present,  abdominal straining not present, normal urethral sphincter activity on EMG.   Past Medical History: Patient  has a past medical history of Arthritis, Hyperlipemia, Hypertension, Hypothyroidism, Sleep apnea, and Vertigo.   Past Surgical History: She  has a past surgical history that includes Eye surgery; Lacrimal duct probing w/ dacryoplasty (Bilateral); Cholecystectomy; Knee arthroscopy (Left); Carpal tunnel release (Bilateral); Thyroidectomy (Right); Shoulder arthroscopy with open rotator cuff repair (Right, 09/03/2014); Vaginal hysterectomy (N/A, 03/12/2017); Salpingoophorectomy (Left, 03/12/2017); Anterior and posterior repair (N/A, 03/12/2017); Cystoscopy (N/A, 03/12/2017); Knee arthroscopy with medial menisectomy (Right, 02/13/2019); Total knee arthroplasty (Right, 05/20/2019); and Total knee arthroplasty (Left, 12/23/2019).   Medications: She has a current medication list which includes the following prescription(s): acetaminophen, acetaminophen, celecoxib, premarin, conjugated estrogens, fexofenadine, fluticasone, levothyroxine, lisinopril-hydrochlorothiazide, polyethylene glycol powder, rosuvastatin, scar treatment products, tramadol, and tramadol.   Allergies: Patient is allergic to codeine and doxycycline.   Social History: Patient  reports that she has never smoked. She has never used smokeless tobacco. She reports previous alcohol use. She reports that she does not use drugs.      OBJECTIVE     Physical Exam: Vitals:    04/28/20 1343  BP: 116/73  Pulse: 85  Weight: 141 lb (64 kg)   Gen: No apparent distress, A&O x 3.  Detailed Urogynecologic Evaluation:  Deferred. Prior exam showed:  POP-Q (04/06/20):   POP-Q  0                                            Aa   0                                           Ba  -6                                              C   3.5                                            Gh  3                                            Pb  7                                            tvl   -2  Ap  -2                                            Bp                                                 D      ASSESSMENT AND PLAN    Ms. Crook is a 70 y.o. with:  1. Prolapse of anterior vaginal wall   2. Pre-op evaluation   3. SUI (stress urinary incontinence, female)     Plan for surgery: Exam under anesthesia, anterior repair, midurethral sling and cystoscopy  - We reviewed the patient's specific anatomic and functional findings, with the assistance of diagrams, and together finalized the above procedure. The planned surgical procedures were discussed along with the surgical risks outlined below, which were also provided on a detailed handout. Additional treatment options including expectant management, conservative management, medical management were discussed where appropriate.  We reviewed the benefits and risks of each treatment option.   General Surgical Risks: For all procedures, there are risks of bleeding, infection, damage to surrounding organs including but not limited to bowel, bladder, blood vessels, ureters and nerves, and need for further surgery if an injury were to occur. These risks are all low with minimally invasive surgery.   There are risks of numbness and weakness at any body site or buttock/rectal pain.  It is possible that baseline pain can be worsened by surgery, either with or without mesh. If  surgery is vaginal, there is also a low risk of possible conversion to laparoscopy or open abdominal incision where indicated. Very rare risks include blood transfusion, blood clot, heart attack, pneumonia, or death.   There is also a risk of short-term postoperative urinary retention with need to use a catheter. About half of patients need to go home from surgery with a catheter, which is then later removed in the office. The risk of long-term need for a catheter is very low. There is also a risk of worsening of overactive bladder.   Sling: The effectiveness of a midurethral vaginal mesh sling is approximately 85%, and thus, there will be times when you may leak urine after surgery, especially if your bladder is full or if you have a strong cough. There is a balance between making the sling tight enough to treat your leakage but not too tight so that you have long-term difficulty emptying your bladder. A mesh sling will not directly treat overactive bladder/urge incontinence and may worsen it.  There is an FDA safety notification on vaginal mesh procedures for prolapse but NOT mesh slings. We have extensive experience and training with mesh placement and we have close postoperative follow up to identify any potential complications from mesh. It is important to realize that this mesh is a permanent implant that cannot be easily removed. There are rare risks of mesh exposure (2-4%), pain with intercourse (0-7%), and infection (<1%). The risk of mesh exposure if more likely in a woman with risks for poor healing (prior radiation, poorly controlled diabetes, or immunocompromised). The risk of new or worsened chronic pain after mesh implant is more common in women with baseline chronic pain and/or poorly controlled anxiety or depression. Approximately 2-4%  of patients will experience longer-term post-operative voiding dysfunction that may require surgical revision of the sling. We also reviewed that  postoperatively, her stream may not be as strong as before surgery.   Prolapse (with or without mesh): Risk factors for surgical failure  include things that put pressure on your pelvis and the surgical repair, including obesity, chronic cough, and heavy lifting or straining (including lifting children or adults, straining on the toilet, or lifting heavy objects such as furniture or anything weighing >25 lbs. Risks of recurrence is 20-30% with vaginal native tissue repair and a less than 10% with sacrocolpopexy with mesh.     - For preop Visit:  She is required to have a visit within 30 days of her surgery. We reviewed that COVID testing will take place prior to surgery and her surgery will be cancelled if she tests positive.   Today we reviewed pre-operative preparation, peri-operative expectations, and post-operative instructions/recovery.  She was provided with instructional handouts. She understands not to take aspirin (>81mg ) or NSAIDs 7 days prior to surgery. Prescriptions provided for: Tramadol 50mg , Tylenol 500mg , and Miralax. She cannot take ibuprofen per her PCP. These prescriptions will be sent prior to surgery.  - Medical clearance: not required  - Anticoagulant use: No - Medicaid Hysterectomy form: No - Accepts blood transfusion: Yes - Expected length of stay: outpatient    Jaquita Folds, MD  Time spent: I spent 25 minutes dedicated to the care of this patient on the date of this encounter to include pre-visit review of records, face-to-face time with the patient discussing surgery and post visit documentation and ordering medication/ testing.

## 2020-04-28 ENCOUNTER — Other Ambulatory Visit: Payer: Self-pay

## 2020-04-28 ENCOUNTER — Encounter (HOSPITAL_BASED_OUTPATIENT_CLINIC_OR_DEPARTMENT_OTHER): Payer: Self-pay | Admitting: Obstetrics and Gynecology

## 2020-04-28 ENCOUNTER — Ambulatory Visit (INDEPENDENT_AMBULATORY_CARE_PROVIDER_SITE_OTHER): Payer: Medicare Other | Admitting: Obstetrics and Gynecology

## 2020-04-28 ENCOUNTER — Encounter: Payer: Self-pay | Admitting: Obstetrics and Gynecology

## 2020-04-28 VITALS — BP 116/73 | HR 85 | Wt 141.0 lb

## 2020-04-28 DIAGNOSIS — Z01818 Encounter for other preprocedural examination: Secondary | ICD-10-CM | POA: Diagnosis not present

## 2020-04-28 DIAGNOSIS — N8110 Cystocele, unspecified: Secondary | ICD-10-CM

## 2020-04-28 DIAGNOSIS — N393 Stress incontinence (female) (male): Secondary | ICD-10-CM

## 2020-04-28 MED ORDER — POLYETHYLENE GLYCOL 3350 17 GM/SCOOP PO POWD: 1.0000 | Freq: Once | ORAL | 0 refills | Status: AC

## 2020-04-28 MED ORDER — ACETAMINOPHEN 500 MG PO TABS: 500.0000 mg | ORAL_TABLET | Freq: Four times a day (QID) | ORAL | 0 refills | Status: DC | PRN

## 2020-04-28 MED ORDER — TRAMADOL HCL 50 MG PO TABS: 50.0000 mg | ORAL_TABLET | Freq: Three times a day (TID) | ORAL | 0 refills | Status: AC | PRN

## 2020-04-28 NOTE — Progress Notes (Addendum)
Epic inbasket message sent to dr schroeder and can use cbc with dif done 04-20-2020 care everywhere for 05-03-2020 surgery, order for cbc dos cancelled in epic    Spoke w/ via phone for pre-op interview---pt Lab needs dos---- none           Lab results------cbc with dif, cmet t 4 all done on 04-20-2020 care everywhere, ekg 12-19-2019 [facility] chart/epic COVID test ------04-29-2020 250 pm Arrive at -------930 am 05-03-2020 NPO after MN NO Solid Food.  Clear liquids from MN until---830 am then npo Med rec completed Medications to take morning of surgery -----levothyroxine, fexofexadine Diabetic medication -----n/a Patient instructed to bring photo id and insurance card day of surgery Patient aware to have Driver (ride ) / caregiver   Nicki Reaper husband will stay  for 24 hours after surgery  Patient Special Instructions -----bring cpap mask tubing and machine and leave in car Pre-Op special Istructions -----none Patient verbalized understanding of instructions that were given at this phone interview. Patient denies shortness of breath, chest pain, fever, cough at this phone interview.

## 2020-04-28 NOTE — H&P (Signed)
[facility] Pre-Operative H&P  Subjective Chief Complaint: Jordan Palmer presents for a preoperative encounter.   History of Present Illness: Jordan Palmer is a 70 y.o. female who presents for preoperative visit.  She is scheduled to undergo Exam under anesthesia, anterior repair, midurethral sling and cystoscopy on 05/03/20.  Her symptoms include vaginal bulge and leakage, and she was was found to have Stage II anterior, Stage I posterior, Stage I apical prolapse and stress incontinence.   Urodynamics showed: 1. Sensation wasnormal; capacity wasnormal 2. Stress Incontinencewasdemonstrated at normalpressures; 3. Detrusor Overactivitywas notdemonstrated. 4. Emptying wasnormalwith a normalPVR, a sustained detrusor contraction present, abdominal straining not present,normalurethral sphincter activity on EMG.  Past Medical History:  Diagnosis Date  . Arthritis   . Hyperlipemia   . Hypertension   . Hypothyroidism   . Sleep apnea   . Vertigo      Past Surgical History:  Procedure Laterality Date  . ANTERIOR AND POSTERIOR REPAIR N/A 03/12/2017   Procedure: ANTERIOR (CYSTOCELE) AND POSTERIOR REPAIR (RECTOCELE);  Surgeon: Salvadore Dom, MD;  Location: [facility];  Service: Gynecology;  Laterality: N/A;  . CARPAL TUNNEL RELEASE Bilateral   . CHOLECYSTECTOMY    . CYSTOSCOPY N/A 03/12/2017   Procedure: CYSTOSCOPY;  Surgeon: Salvadore Dom, MD;  Location: [facility];  Service: Gynecology;  Laterality: N/A;  . EYE SURGERY    . Palmer ARTHROSCOPY Left   . Palmer ARTHROSCOPY WITH MEDIAL MENISECTOMY Right 02/13/2019   Procedure: Palmer ARTHROSCOPY WITH MEDIAL MENISECTOMY;  Surgeon: Thornton Park, MD;  Location: [facility];  Service: Orthopedics;  Laterality: Right;  . LACRIMAL DUCT PROBING W/ DACRYOPLASTY Bilateral   . SALPINGOOPHORECTOMY Left 03/12/2017   Procedure: SALPINGECTOMY;  Surgeon: Salvadore Dom, MD;  Location:  [facility];  Service: Gynecology;  Laterality: Left;  . SHOULDER ARTHROSCOPY WITH OPEN ROTATOR CUFF REPAIR Right 09/03/2014   Procedure: SHOULDER ARTHROSCOPY WITH OPEN ROTATOR CUFF REPAIR;  Surgeon: Thornton Park, MD;  Location: [facility];  Service: Orthopedics;  Laterality: Right;  . THYROIDECTOMY Right    hemi-thyroidectomy  . TOTAL Palmer ARTHROPLASTY Right 05/20/2019   Procedure: TOTAL Palmer ARTHROPLASTY;  Surgeon: Thornton Park, MD;  Location: [facility];  Service: Orthopedics;  Laterality: Right;  . TOTAL Palmer ARTHROPLASTY Left 12/23/2019   Procedure: Left TOTAL Palmer ARTHROPLASTY;  Surgeon: Thornton Park, MD;  Location: [facility];  Service: Orthopedics;  Laterality: Left;  Marland Kitchen VAGINAL HYSTERECTOMY N/A 03/12/2017   Procedure: HYSTERECTOMY VAGINAL;  Surgeon: Salvadore Dom, MD;  Location: [facility];  Service: Gynecology;  Laterality: N/A;    is allergic to codeine and doxycycline.   Family History  Problem Relation Age of Onset  . Diabetes type II Father   . CAD Father   . Alzheimer's disease Father   . Alzheimer's disease Mother   . Breast cancer Neg Hx     Social History   Tobacco Use  . Smoking status: Never Smoker  . Smokeless tobacco: Never Used  Vaping Use  . Vaping Use: Never used  Substance Use Topics  . Alcohol use: Not Currently  . Drug use: No     Review of Systems was negative for a full 10 system review except as noted in the History of Present Illness.  No current facility-administered medications for this encounter.  Current Outpatient Medications:  .  acetaminophen (TYLENOL) 500 MG tablet, Take 1,000 mg by mouth 2 (two) times daily as needed for moderate pain. , Disp: ,  Rfl:  .  acetaminophen (TYLENOL) 500 MG tablet, Take 1 tablet (500 mg total) by mouth every 6 (six) hours as needed (pain)., Disp: 30 tablet, Rfl: 0 .  celecoxib (CELEBREX) 200 MG capsule, Take 200 mg by mouth daily., Disp: , Rfl:  .  conjugated  estrogens (PREMARIN) vaginal cream, 1/2 gram vaginally twice weekly (Patient taking differently: Place 1 Applicatorful vaginally 2 (two) times a week.), Disp: 30 g, Rfl: 1 .  conjugated estrogens (PREMARIN) vaginal cream, Place 3.79 Applicatorfuls vaginally 2 (two) times a week. Place 0.5g nightly twice a week, Disp: 30 g, Rfl: 11 .  fexofenadine (ALLEGRA) 180 MG tablet, Take 180 mg by mouth daily as needed for allergies., Disp: , Rfl:  .  fluticasone (FLONASE) 50 MCG/ACT nasal spray, Place 1 spray into both nostrils daily as needed for allergies., Disp: , Rfl:  .  levothyroxine (SYNTHROID) 88 MCG tablet, Take 88 mcg by mouth daily before breakfast., Disp: , Rfl:  .  lisinopril-hydrochlorothiazide (PRINZIDE,ZESTORETIC) 20-25 MG per tablet, Take 1 tablet by mouth daily., Disp: , Rfl:  .  polyethylene glycol powder (GLYCOLAX/MIRALAX) 17 GM/SCOOP powder, Take 255 g by mouth once for 1 dose. Drink 17g (1 scoop) dissolved in water per day., Disp: 255 g, Rfl: 0 .  rosuvastatin (CRESTOR) 5 MG tablet, Take 5 mg by mouth daily. , Disp: , Rfl:  .  Scar Treatment Products (SCAR GEL EX), Apply 1 application topically daily., Disp: , Rfl:  .  traMADol (ULTRAM) 50 MG tablet, Take 50 mg by mouth every 6 (six) hours as needed. As needed, Disp: , Rfl:  .  traMADol (ULTRAM) 50 MG tablet, Take 1 tablet (50 mg total) by mouth every 8 (eight) hours as needed for up to 5 days., Disp: 10 tablet, Rfl: 0   Objective Gen: No distress Lungs: normal respirations  Previous Pelvic Exam showed: POP-Q (04/06/20):   POP-Q  0 Aa  0 Ba  -6 C   3.5 Gh  3 Pb  7 tvl   -2 Ap   -2 Bp   D       Assessment/ Plan  Assessment: The patient is a 70 y.o. year old scheduled to undergo with stage II anterior prolapse, and cystoscopy.   Plan: Exam under anesthesia, anterior repair, midurethral sling and cystoscopy.    Jaquita Folds, MD

## 2020-04-29 ENCOUNTER — Other Ambulatory Visit (HOSPITAL_COMMUNITY)
Admission: RE | Admit: 2020-04-29 | Discharge: 2020-04-29 | Disposition: A | Discharge status: Home / Self Care | Payer: Medicare Other | Source: Ambulatory Visit | Attending: Obstetrics and Gynecology | Admitting: Obstetrics and Gynecology

## 2020-04-29 DIAGNOSIS — Z20822 Contact with and (suspected) exposure to covid-19: Secondary | ICD-10-CM | POA: Diagnosis not present

## 2020-04-29 DIAGNOSIS — Z01812 Encounter for preprocedural laboratory examination: Secondary | ICD-10-CM | POA: Diagnosis present

## 2020-04-29 LAB — SARS CORONAVIRUS 2 (TAT 6-24 HRS): SARS Coronavirus 2: NEGATIVE

## 2020-04-30 ENCOUNTER — Ambulatory Visit: Payer: PRIVATE HEALTH INSURANCE | Admitting: Obstetrics and Gynecology

## 2020-05-03 ENCOUNTER — Encounter (HOSPITAL_BASED_OUTPATIENT_CLINIC_OR_DEPARTMENT_OTHER): Payer: Self-pay | Admitting: Obstetrics and Gynecology

## 2020-05-03 ENCOUNTER — Ambulatory Visit (HOSPITAL_BASED_OUTPATIENT_CLINIC_OR_DEPARTMENT_OTHER)
Admission: RE | Admit: 2020-05-03 | Discharge: 2020-05-03 | Disposition: A | Discharge status: Home / Self Care | Payer: Medicare Other | Attending: Obstetrics and Gynecology | Admitting: Obstetrics and Gynecology

## 2020-05-03 ENCOUNTER — Ambulatory Visit (HOSPITAL_BASED_OUTPATIENT_CLINIC_OR_DEPARTMENT_OTHER): Payer: Medicare Other | Admitting: Anesthesiology

## 2020-05-03 ENCOUNTER — Telehealth: Payer: Self-pay | Admitting: Obstetrics and Gynecology

## 2020-05-03 ENCOUNTER — Encounter (HOSPITAL_BASED_OUTPATIENT_CLINIC_OR_DEPARTMENT_OTHER)
Admission: RE | Disposition: A | Discharge status: Home / Self Care | Payer: Self-pay | Source: Home / Self Care | Attending: Obstetrics and Gynecology

## 2020-05-03 DIAGNOSIS — Z79899 Other long term (current) drug therapy: Secondary | ICD-10-CM | POA: Diagnosis not present

## 2020-05-03 DIAGNOSIS — Z881 Allergy status to other antibiotic agents status: Secondary | ICD-10-CM | POA: Insufficient documentation

## 2020-05-03 DIAGNOSIS — Z79890 Hormone replacement therapy (postmenopausal): Secondary | ICD-10-CM | POA: Diagnosis not present

## 2020-05-03 DIAGNOSIS — N393 Stress incontinence (female) (male): Secondary | ICD-10-CM | POA: Insufficient documentation

## 2020-05-03 DIAGNOSIS — N8110 Cystocele, unspecified: Secondary | ICD-10-CM | POA: Insufficient documentation

## 2020-05-03 DIAGNOSIS — Z79891 Long term (current) use of opiate analgesic: Secondary | ICD-10-CM | POA: Insufficient documentation

## 2020-05-03 DIAGNOSIS — Z885 Allergy status to narcotic agent status: Secondary | ICD-10-CM | POA: Diagnosis not present

## 2020-05-03 DIAGNOSIS — Z791 Long term (current) use of non-steroidal anti-inflammatories (NSAID): Secondary | ICD-10-CM | POA: Insufficient documentation

## 2020-05-03 HISTORY — DX: Uterovaginal prolapse, unspecified: N81.4

## 2020-05-03 HISTORY — PX: CYSTOCELE REPAIR: SHX163

## 2020-05-03 HISTORY — DX: Presence of spectacles and contact lenses: Z97.3

## 2020-05-03 HISTORY — DX: Stress incontinence (female) (male): N39.3

## 2020-05-03 HISTORY — PX: BLADDER SUSPENSION: SHX72

## 2020-05-03 HISTORY — PX: CYSTOSCOPY: SHX5120

## 2020-05-03 LAB — TYPE AND SCREEN
ABO/RH(D): A NEG
Antibody Screen: NEGATIVE

## 2020-05-03 SURGERY — COLPORRHAPHY, ANTERIOR, FOR CYSTOCELE REPAIR
Anesthesia: General | Site: Vagina

## 2020-05-03 MED ORDER — KETAMINE HCL 50 MG/5ML IJ SOSY
PREFILLED_SYRINGE | INTRAMUSCULAR | Status: AC
  Filled 2020-05-03: qty 5

## 2020-05-03 MED ORDER — PHENYLEPHRINE 40 MCG/ML (10ML) SYRINGE FOR IV PUSH (FOR BLOOD PRESSURE SUPPORT)
PREFILLED_SYRINGE | INTRAVENOUS | Status: DC | PRN
  Administered 2020-05-03: 120 ug via INTRAVENOUS
  Administered 2020-05-03: 40 ug via INTRAVENOUS
  Administered 2020-05-03: 80 ug via INTRAVENOUS

## 2020-05-03 MED ORDER — ONDANSETRON HCL 4 MG/2ML IJ SOLN
4.0000 mg | Freq: Once | INTRAMUSCULAR | Status: DC | PRN
Start: 2020-05-03 — End: 2020-05-03

## 2020-05-03 MED ORDER — OXYCODONE HCL 5 MG/5ML PO SOLN: 5.0000 mg | Freq: Once | ORAL | Status: AC | PRN

## 2020-05-03 MED ORDER — FENTANYL CITRATE (PF) 100 MCG/2ML IJ SOLN
INTRAMUSCULAR | Status: AC
  Filled 2020-05-03: qty 2

## 2020-05-03 MED ORDER — LIDOCAINE 2% (20 MG/ML) 5 ML SYRINGE
INTRAMUSCULAR | Status: DC | PRN
  Administered 2020-05-03: 100 mg via INTRAVENOUS

## 2020-05-03 MED ORDER — ACETAMINOPHEN 160 MG/5ML PO SOLN
325.0000 mg | ORAL | Status: DC | PRN
Start: 2020-05-03 — End: 2020-05-03

## 2020-05-03 MED ORDER — MIDAZOLAM HCL 2 MG/2ML IJ SOLN
INTRAMUSCULAR | Status: DC | PRN
  Administered 2020-05-03 (×2): 1 mg via INTRAVENOUS

## 2020-05-03 MED ORDER — OXYCODONE HCL 5 MG PO TABS
5.0000 mg | ORAL_TABLET | Freq: Once | ORAL | Status: AC | PRN
  Administered 2020-05-03: 5 mg via ORAL

## 2020-05-03 MED ORDER — PHENAZOPYRIDINE HCL 100 MG PO TABS
200.0000 mg | ORAL_TABLET | ORAL | Status: AC
  Administered 2020-05-03: 200 mg via ORAL

## 2020-05-03 MED ORDER — ROCURONIUM BROMIDE 10 MG/ML (PF) SYRINGE
PREFILLED_SYRINGE | INTRAVENOUS | Status: AC
  Filled 2020-05-03: qty 10

## 2020-05-03 MED ORDER — PROPOFOL 10 MG/ML IV BOLUS
INTRAVENOUS | Status: AC
  Filled 2020-05-03: qty 20

## 2020-05-03 MED ORDER — KETOROLAC TROMETHAMINE 30 MG/ML IJ SOLN
INTRAMUSCULAR | Status: DC | PRN
  Administered 2020-05-03: 30 mg via INTRAVENOUS

## 2020-05-03 MED ORDER — SODIUM CHLORIDE 0.9 % IR SOLN
Status: DC | PRN
  Administered 2020-05-03: 1000 mL via INTRAVESICAL

## 2020-05-03 MED ORDER — PHENYLEPHRINE 40 MCG/ML (10ML) SYRINGE FOR IV PUSH (FOR BLOOD PRESSURE SUPPORT)
PREFILLED_SYRINGE | INTRAVENOUS | Status: AC
  Filled 2020-05-03: qty 10

## 2020-05-03 MED ORDER — ROCURONIUM BROMIDE 10 MG/ML (PF) SYRINGE
PREFILLED_SYRINGE | INTRAVENOUS | Status: DC | PRN
  Administered 2020-05-03: 30 mg via INTRAVENOUS

## 2020-05-03 MED ORDER — MEPERIDINE HCL 25 MG/ML IJ SOLN: 6.2500 mg | INTRAMUSCULAR | Status: DC | PRN

## 2020-05-03 MED ORDER — POVIDONE-IODINE 10 % EX SWAB: 2.0000 "application " | Freq: Once | CUTANEOUS | Status: DC

## 2020-05-03 MED ORDER — LIDOCAINE-EPINEPHRINE 1 %-1:100000 IJ SOLN
INTRAMUSCULAR | Status: DC | PRN
  Administered 2020-05-03: 12 mL

## 2020-05-03 MED ORDER — ONDANSETRON HCL 4 MG/2ML IJ SOLN
INTRAMUSCULAR | Status: AC
  Filled 2020-05-03: qty 2

## 2020-05-03 MED ORDER — OXYCODONE HCL 5 MG PO TABS
ORAL_TABLET | ORAL | Status: AC
  Filled 2020-05-03: qty 1

## 2020-05-03 MED ORDER — PHENAZOPYRIDINE HCL 100 MG PO TABS
ORAL_TABLET | ORAL | Status: AC
  Filled 2020-05-03: qty 2

## 2020-05-03 MED ORDER — DEXAMETHASONE SODIUM PHOSPHATE 10 MG/ML IJ SOLN
INTRAMUSCULAR | Status: AC
  Filled 2020-05-03: qty 1

## 2020-05-03 MED ORDER — ONDANSETRON HCL 4 MG/2ML IJ SOLN
INTRAMUSCULAR | Status: DC | PRN
  Administered 2020-05-03: 4 mg via INTRAVENOUS

## 2020-05-03 MED ORDER — KETOROLAC TROMETHAMINE 30 MG/ML IJ SOLN
INTRAMUSCULAR | Status: AC
  Filled 2020-05-03: qty 1

## 2020-05-03 MED ORDER — ACETAMINOPHEN 500 MG PO TABS
1000.0000 mg | ORAL_TABLET | ORAL | Status: AC
  Administered 2020-05-03: 1000 mg via ORAL

## 2020-05-03 MED ORDER — 0.9 % SODIUM CHLORIDE (POUR BTL) OPTIME
TOPICAL | Status: DC | PRN
  Administered 2020-05-03: 500 mL

## 2020-05-03 MED ORDER — MIDAZOLAM HCL 2 MG/2ML IJ SOLN
INTRAMUSCULAR | Status: AC
  Filled 2020-05-03: qty 2

## 2020-05-03 MED ORDER — PROPOFOL 10 MG/ML IV BOLUS
INTRAVENOUS | Status: DC | PRN
  Administered 2020-05-03: 120 mg via INTRAVENOUS

## 2020-05-03 MED ORDER — LIDOCAINE 2% (20 MG/ML) 5 ML SYRINGE
INTRAMUSCULAR | Status: AC
  Filled 2020-05-03: qty 5

## 2020-05-03 MED ORDER — DEXAMETHASONE SODIUM PHOSPHATE 10 MG/ML IJ SOLN
INTRAMUSCULAR | Status: DC | PRN
  Administered 2020-05-03: 10 mg via INTRAVENOUS

## 2020-05-03 MED ORDER — ACETAMINOPHEN 325 MG PO TABS: 325.0000 mg | ORAL_TABLET | ORAL | Status: DC | PRN

## 2020-05-03 MED ORDER — SUCCINYLCHOLINE CHLORIDE 200 MG/10ML IV SOSY
PREFILLED_SYRINGE | INTRAVENOUS | Status: DC | PRN
  Administered 2020-05-03: 140 mg via INTRAVENOUS

## 2020-05-03 MED ORDER — SUGAMMADEX SODIUM 200 MG/2ML IV SOLN
INTRAVENOUS | Status: DC | PRN
  Administered 2020-05-03: 200 mg via INTRAVENOUS

## 2020-05-03 MED ORDER — FENTANYL CITRATE (PF) 100 MCG/2ML IJ SOLN
INTRAMUSCULAR | Status: DC | PRN
  Administered 2020-05-03 (×2): 50 ug via INTRAVENOUS

## 2020-05-03 MED ORDER — FENTANYL CITRATE (PF) 100 MCG/2ML IJ SOLN
25.0000 ug | INTRAMUSCULAR | Status: DC | PRN
  Administered 2020-05-03: 50 ug via INTRAVENOUS

## 2020-05-03 MED ORDER — CEFAZOLIN SODIUM-DEXTROSE 2-4 GM/100ML-% IV SOLN
INTRAVENOUS | Status: AC
  Filled 2020-05-03: qty 100

## 2020-05-03 MED ORDER — CEFAZOLIN SODIUM-DEXTROSE 2-4 GM/100ML-% IV SOLN
2.0000 g | INTRAVENOUS | Status: AC
  Administered 2020-05-03: 2 g via INTRAVENOUS

## 2020-05-03 MED ORDER — LACTATED RINGERS IV SOLN: INTRAVENOUS | Status: DC

## 2020-05-03 MED ORDER — ACETAMINOPHEN 500 MG PO TABS
ORAL_TABLET | ORAL | Status: AC
  Filled 2020-05-03: qty 2

## 2020-05-03 SURGICAL SUPPLY — 55 items
ADH SKN CLS APL DERMABOND .7 (GAUZE/BANDAGES/DRESSINGS) ×2
AGENT HMST KT MTR STRL THRMB (HEMOSTASIS)
APL PRP STRL LF DISP 70% ISPRP (MISCELLANEOUS)
BLADE SURG 15 STRL LF DISP TIS (BLADE) ×2 IMPLANT
BLADE SURG 15 STRL SS (BLADE) ×3
BNDG GAUZE ELAST 4 BULKY (GAUZE/BANDAGES/DRESSINGS) IMPLANT
CHLORAPREP W/TINT 26 (MISCELLANEOUS) IMPLANT
COVER WAND RF STERILE (DRAPES) ×3 IMPLANT
DECANTER SPIKE VIAL GLASS SM (MISCELLANEOUS) ×3 IMPLANT
DERMABOND ADVANCED (GAUZE/BANDAGES/DRESSINGS) ×1
DERMABOND ADVANCED .7 DNX12 (GAUZE/BANDAGES/DRESSINGS) ×2 IMPLANT
DEVICE CAPIO SLIM SINGLE (INSTRUMENTS) ×3 IMPLANT
ELECT REM PT RETURN 9FT ADLT (ELECTROSURGICAL) ×3
ELECTRODE REM PT RTRN 9FT ADLT (ELECTROSURGICAL) ×2 IMPLANT
GAUZE PACKING 2X5 YD STRL (GAUZE/BANDAGES/DRESSINGS) IMPLANT
GLOVE SURG ENC MOIS LTX SZ6 (GLOVE) ×3 IMPLANT
GLOVE SURG LTX SZ6 (GLOVE) ×3 IMPLANT
GLOVE SURG UNDER POLY LF SZ6.5 (GLOVE) ×3 IMPLANT
GLOVE SURG UNDER POLY LF SZ7 (GLOVE) ×3 IMPLANT
GOWN STRL REUS W/TWL LRG LVL3 (GOWN DISPOSABLE) ×9 IMPLANT
HIBICLENS CHG 4% 4OZ (MISCELLANEOUS) ×3 IMPLANT
HOLDER FOLEY CATH W/STRAP (MISCELLANEOUS) ×3 IMPLANT
IV NS 1000ML (IV SOLUTION) ×3
IV NS 1000ML BAXH (IV SOLUTION) ×2 IMPLANT
KIT TURNOVER CYSTO (KITS) ×3 IMPLANT
MANIFOLD NEPTUNE II (INSTRUMENTS) ×3 IMPLANT
NEEDLE HYPO 22GX1.5 SAFETY (NEEDLE) ×3 IMPLANT
NEEDLE MAYO 6 CRC TAPER PT (NEEDLE) IMPLANT
NS IRRIG 1000ML POUR BTL (IV SOLUTION) ×3 IMPLANT
PACK CYSTO (CUSTOM PROCEDURE TRAY) ×3 IMPLANT
PACK VAGINAL WOMENS (CUSTOM PROCEDURE TRAY) ×3 IMPLANT
PENCIL BUTTON HOLSTER BLD 10FT (ELECTRODE) ×3 IMPLANT
RETRACTOR LONE STAR DISPOSABLE (INSTRUMENTS) ×3 IMPLANT
RETRACTOR STAY HOOK 5MM (MISCELLANEOUS) IMPLANT
SET IRRIG Y TYPE TUR BLADDER L (SET/KITS/TRAYS/PACK) ×3 IMPLANT
SURGIFLO W/THROMBIN 8M KIT (HEMOSTASIS) IMPLANT
SUT ABS MONO DBL WITH NDL 48IN (SUTURE) IMPLANT
SUT CAPIO PGA 48IN SZ 0 (SUTURE) ×3 IMPLANT
SUT CV-0 GORETEX TFX25 36 (SUTURE) IMPLANT
SUT MON AB 2-0 SH 27 (SUTURE) IMPLANT
SUT PDS PLUS 0 (SUTURE)
SUT PDS PLUS AB 0 CT-2 (SUTURE) IMPLANT
SUT VIC AB 0 CT1 27 (SUTURE) ×3
SUT VIC AB 0 CT1 27XBRD ANTBC (SUTURE) ×2 IMPLANT
SUT VIC AB 2-0 SH 27 (SUTURE)
SUT VIC AB 2-0 SH 27XBRD (SUTURE) IMPLANT
SUT VIC AB 3-0 SH 18 (SUTURE) IMPLANT
SUT VICRYL 2-0 SH 8X27 (SUTURE) ×3 IMPLANT
SYR BULB EAR ULCER 3OZ GRN STR (SYRINGE) ×3 IMPLANT
SYS SLING ADV FIT BLUE TRNSVAG (Sling) ×3 IMPLANT
TOWEL OR 17X26 10 PK STRL BLUE (TOWEL DISPOSABLE) ×3 IMPLANT
TRAY FOL W/BAG SLVR 16FR STRL (SET/KITS/TRAYS/PACK) ×2 IMPLANT
TRAY FOLEY W/BAG SLVR 14FR (SET/KITS/TRAYS/PACK) ×3 IMPLANT
TRAY FOLEY W/BAG SLVR 14FR LF (SET/KITS/TRAYS/PACK) IMPLANT
TRAY FOLEY W/BAG SLVR 16FR LF (SET/KITS/TRAYS/PACK) ×3

## 2020-05-03 NOTE — Interval H&P Note (Signed)
History and Physical Interval Note:  05/03/2020 10:40 AM  Jordan Palmer  has presented today for surgery, with the diagnosis of prolapse of anterior vaginal wall; stress urinary incontinence.  The various methods of treatment have been discussed with the patient and family. After consideration of risks, benefits and other options for treatment, the patient has consented to  Procedure(s) with comments: ANTERIOR REPAIR (CYSTOCELE) (N/A)  TRANSVAGINAL TAPE (TVT) PROCEDURE (N/A) CYSTOSCOPY (N/A) as a surgical intervention.  The patient's history has been reviewed, patient examined, no change in status, stable for surgery.  I have reviewed the patient's chart and labs.  Questions were answered to the patient's satisfaction.     Jaquita Folds

## 2020-05-03 NOTE — Transfer of Care (Signed)
Immediate Anesthesia Transfer of Care Note  Patient: Jordan Palmer  Procedure(s) Performed: ANTERIOR REPAIR (CYSTOCELE) (N/A Vagina ) TRANSVAGINAL TAPE (TVT) PROCEDURE (N/A Vagina ) CYSTOSCOPY (N/A Bladder)  Patient Location: PACU  Anesthesia Type:General  Level of Consciousness: awake, alert , oriented and patient cooperative  Airway & Oxygen Therapy: Patient Spontanous Breathing and Patient connected to nasal cannula oxygen  Post-op Assessment: Report given to RN and Post -op Vital signs reviewed and stable  Post vital signs: Reviewed and stable  Last Vitals:  Vitals Value Taken Time  BP 128/60 05/03/20 1205  Temp    Pulse 96 05/03/20 1207  Resp 17 05/03/20 1207  SpO2 100 % 05/03/20 1207  Vitals shown include unvalidated device data.  Last Pain:  Vitals:   05/03/20 0936  TempSrc: Oral  PainSc: 3       Patients Stated Pain Goal: 5 (80/22/17 9810)  Complications: No complications documented.

## 2020-05-03 NOTE — Telephone Encounter (Signed)
Error

## 2020-05-03 NOTE — Progress Notes (Signed)
Received report from [place], Therapist, sports. Placed in recliner and drining fluids in an affrt to urinate. Spouse at bedside. Pain medication administered by Vivien Rota, RN

## 2020-05-03 NOTE — Anesthesia Postprocedure Evaluation (Signed)
Anesthesia Post Note  Patient: Jordan Palmer  Procedure(s) Performed: ANTERIOR REPAIR (CYSTOCELE) (N/A Vagina ) TRANSVAGINAL TAPE (TVT) PROCEDURE (N/A Vagina ) CYSTOSCOPY (N/A Bladder)     Patient location during evaluation: PACU Anesthesia Type: General Level of consciousness: awake and alert Pain management: pain level controlled Vital Signs Assessment: post-procedure vital signs reviewed and stable Respiratory status: spontaneous breathing, nonlabored ventilation, respiratory function stable and patient connected to nasal cannula oxygen Cardiovascular status: blood pressure returned to baseline and stable Postop Assessment: no apparent nausea or vomiting Anesthetic complications: no   No complications documented.  Last Vitals:  Vitals:   05/03/20 1250 05/03/20 1254  BP:    Pulse: 80 83  Resp: 16 20  Temp:    SpO2: 98% 99%    Last Pain:  Vitals:   05/03/20 1254  TempSrc:   PainSc: 0-No pain                 Kalyn Dimattia

## 2020-05-03 NOTE — OR Nursing (Signed)
13:05 275 cc instilled into bladder via cathetor.  Cathetor balloon deflated and removed. Darin Engels rn

## 2020-05-03 NOTE — Op Note (Signed)
Operative Note  Preoperative Diagnosis: anterior vaginal prolapse and stress urinary incontinence  Postoperative Diagnosis: same  Procedures performed:  Anterior repair, midurethral sling (advantage fit) and cystoscopy  Implants:  Implant Name Type Inv. Item Serial No. Manufacturer Lot No. LRB No. Used Action  SYS SLING ADV FIT BLUE TRNSVAG - [id number] Sling SYS SLING ADV FIT BLUE TRNSVAG  [employer] [place] [id number] N/A 1 Implanted    Attending Surgeon: Sherlene Shams, MD  Anesthesia: General endotracheal  Findings: 1. Stage II anterior prolapse on pelvic exam  2. On cystoscopy, normal bladder and urethra without injury, lesion or foreign body. Brisk bilateral efflux noted.    Specimens: none  Estimated blood loss: 50 mL  IV fluids: 1100 mL  Urine output: 326 mL  Complications: none  Procedure in Detail:  After informed consent was obtained, the patient was taken to the operating room where she was placed under anesthesia.  She was then placed in the dorsal lithotomy position in [place], taking care to avoid traction on her extremities. She was then and prepped and draped in the usual sterile fashion.   A foley catheter was placed.  A lonestar retractor was placed with 4 stay hooks. Two Allis clamps were placed at along the anterior vaginal wall defect. 1% lidocaine with epinephrine was injected into the vaginal mucosa. A vertical incision was made between the two Allis clamps with a 15 blade scalpel.  Metzenbaum scissors were used to undermine the vaginal mucosa from the underlying vesicovaginal septum. Plication of the vesicovaginal septum was then performed using 2-0 Vicryl. Excess vaginal epithelium was trimmed and the incision reapproximated with 2-0 Vicryl in a running fashion.    The retropubic midurethral sling was performed next. Two Allis clamps were placed lateral to the location of the midurethra. 1% lidocaine with epinephrine was injected into the vaginal mucosa  and laterally.  Using a 15 blade scapel a vertical incision was made in the vaginal mucosa. Metzenbaum scissors were used to dissect the vaginal mucosa off of the underlying muscularis and to create the periurethral tunnels. The trocar and attached sling were introduced into the right side of the vaginal incision, just inferior to the pubic symphysis on the right side. The trocar was guided through the endopelvic fascia and directly behind the pubic symphysis through the retropubic space, vertically.  The trocar was guided out through the suprapubic region, 2 fingerbreadths lateral to midline at the level of the pubic symphysis on the ipsilateral side. The trocar was similarly placed on the left side.  The Foley catheter was removed.  A 70-degree cystoscope was introduced, and 360-degree inspection revealed no trauma or trocars in the bladder, with bilateral ureteral efflux.  The bladder was drained and the cystoscope was removed.  The Foley catheter was reinserted. The sling was brought to lie beneath the mid-urethra.  An empty needle driver was placed behind the sling to ensure a tension free placement. The plastic sheath was removed from the sling and the distal ends of the sling were trimmed just below the level of the skin incisions. The skin incisions were closed with dermabond.  Hemostasis was noted.  Tension-free positioning of the sling was confirmed.  The vaginal incision was closed with 2-0 vicryl.  The vagina was copiously irrigated.  Hemostasis was noted. The patient tolerated the procedure well and was taken to the recovery room in stable condition. Needle and sponge count was correct x2.   Jaquita Folds, MD

## 2020-05-03 NOTE — Anesthesia Procedure Notes (Signed)
Procedure Name: Intubation Date/Time: 05/03/2020 11:09 AM Performed by: Mechele Claude, CRNA Pre-anesthesia Checklist: Patient identified, Emergency Drugs available, Suction available and Patient being monitored Patient Re-evaluated:Patient Re-evaluated prior to induction Oxygen Delivery Method: Circle system utilized Preoxygenation: Pre-oxygenation with 100% oxygen Induction Type: IV induction Ventilation: Mask ventilation without difficulty Grade View: Grade I Tube type: Oral Tube size: 7.0 mm Number of attempts: 1 Airway Equipment and Method: Oral airway,  Video-laryngoscopy and Rigid stylet Placement Confirmation: ETT inserted through vocal cords under direct vision,  positive ETCO2 and breath sounds checked- equal and bilateral Secured at: 20 cm Tube secured with: Tape Dental Injury: Teeth and Oropharynx as per pre-operative assessment  Difficulty Due To: Difficulty was anticipated, Difficult Airway- due to reduced neck mobility and Difficult Airway- due to limited oral opening Future Recommendations: Recommend- induction with short-acting agent, and alternative techniques readily available Comments: Used Glidescope due to history of [place] use in the past and a MP III.

## 2020-05-03 NOTE — Discharge Instructions (Signed)
Post Anesthesia Home Care Instructions  Activity: Get plenty of rest for the remainder of the day. A responsible adult should stay with you for 24 hours following the procedure.  For the next 24 hours, DO NOT: -Drive a car -Paediatric nurse -Drink alcoholic beverages -Take any medication unless instructed by your physician -Make any legal decisions or sign important papers.  Meals: Start with liquid foods such as gelatin or soup. Progress to regular foods as tolerated. Avoid greasy, spicy, heavy foods. If nausea and/or vomiting occur, drink only clear liquids until the nausea and/or vomiting subsides. Call your physician if vomiting continues.  Special Instructions/Symptoms: Your throat may feel dry or sore from the anesthesia or the breathing tube placed in your throat during surgery. If this causes discomfort, gargle with warm salt water. The discomfort should disappear within 24 hours.        POST OPERATIVE INSTRUCTIONS  General Instructions . Recovery (not bed rest) will last approximately 6 weeks . Walking is encouraged, but refrain from strenuous exercise/ housework/ heavy lifting. o No lifting >10lbs  . Nothing in the vagina- NO intercourse, tampons or douching . Bathing:  Do not submerge in water (NO swimming, bath, hot tub, etc) until after your postop visit. You can shower starting the day after surgery.  . No driving until you are not taking narcotic pain medicine and until your pain is well enough controlled that you can slam on the breaks or make sudden movements if needed.   Taking your medications 1. Please take your acetaminophen and ibuprofen on a schedule for the first 48 hours. Take 600mg  ibuprofen, then take 500mg  acetaminophen 3 hours later, then continue to alternate ibuprofen and acetaminophen. That way you are taking each type of medication every 6 hours. 2. Take the prescribed narcotic (oxycodone, tramadol, etc) as needed, with a maximum being every 4  hours.  3. Take a stool softener daily to keep your stools soft and preventing you from straining. If you have diarrhea, you decrease your stool softener. This is explained more below. We have prescribed you Miralax.  Reasons to Call the Nurse (see last page for phone numbers) . Heavy Bleeding (changing your pad every 1-2 hours) . Persistent nausea/vomiting . Fever (100.4 degrees or more) . Incision problems (pus or other fluid coming out, redness, warmth, increased pain)  Things to Expect After Surgery . Mild to Moderate pain is normal during the first day or two after surgery. If prescribed, take Ibuprofen or Tylenol first and use the stronger medicine for "break-through" pain. You can overlap these medicines because they work differently.   . Constipation   . To Prevent Constipation:  Eat a well-balanced diet including protein, grains, fresh fruit and vegetables.  Drink plenty of fluids. Walk regularly.  Depending on specific instructions from your physician: take Miralax daily and additionally you can add a stool softener (colace/ docusate) and fiber supplement. Continue as long as you're on pain medications.   . To Treat Constipation:  If you do not have a bowel movement in 2 days after surgery, you can take 2 Tbs of Milk of Magnesia 1-2 times a day until you have a bowel movement. If diarrhea occurs, decrease the amount or stop the laxative. If no results with Milk of Magnesia, you can drink a bottle of magnesium citrate which you can purchase over the counter.  . Fatigue:  This is a normal response to surgery and will improve with time.  Plan frequent rest periods throughout the  day.  . Gas Pain:  This is very common but can also be very painful! Drink warm liquids such as herbal teas, bouillon or soup. Walking will help you pass more gas.  Mylicon or Gas-X can be taken over the counter.  . Leaking Urine:  Varying amounts of leakage may occur after surgery.  This should improve with  time. Your bladder needs at least 3 months to recover from surgery. If you leak after surgery, be sure to mention this to your doctor at your post-op visit. If you were taking medications for overactive bladder prior to surgery, be sure to restart the medications immediately after surgery.  . Incisions: If you have incisions on your abdomen, the skin glue will dissolve on its own over time. It is ok to gently rinse with soap and water over these incisions but do not scrub.  Catheter Approximately 50% of patients are unable to urinate after surgery and need to go home with a catheter. This allows your bladder to rest so it can return to full function. If you go home with a catheter, the office will call to set up a voiding trial a few days after surgery. For most patients, by this visit, they are able to urinate on their own. Long term catheter use is rare.   Return to Work  As work demands and recovery times vary widely, it is hard to predict when you will want to return to work. If you have a desk job with no strenuous physical activity, and if you would like to return sooner than generally recommended, discuss this with your provider or call our office.   Post op concerns  For non-emergent issues, please call the Urogynecology Nurse. Please leave a message and someone will contact you within one business day.  You can also send a message through [facility].   AFTER HOURS (After 5:00 PM and on weekends):  For urgent matters that cannot wait until the next business day. Call our office [phone] and connect to the doctor on call.  Please reserve this for important issues.   **FOR ANY TRUE EMERGENCY ISSUES CALL 911 OR GO TO THE NEAREST EMERGENCY ROOM.** Please inform our office or the doctor on call of any emergency.     APPOINTMENTS: Call [phone]   Indwelling Urinary Catheter Care, Adult An indwelling urinary catheter is a thin tube that is put into your bladder. The tube helps to  drain pee (urine) out of your body. The tube goes in through your urethra. Your urethra is where pee comes out of your body. Your pee will come out through the catheter, then it will go into a bag (drainage bag). Take good care of your catheter so it will work well. How to wear your catheter and bag Supplies needed  Sticky tape (adhesive tape) or a leg strap.  Alcohol wipe or soap and water (if you use tape).  A clean towel (if you use tape).  Large overnight bag.  Smaller bag (leg bag). Wearing your catheter Attach your catheter to your leg with tape or a leg strap.  Make sure the catheter is not pulled tight.  If a leg strap gets wet, take it off and put on a dry strap.  If you use tape to hold the bag on your leg: 1. Use an alcohol wipe or soap and water to wash your skin where the tape made it sticky before. 2. Use a clean towel to pat-dry that skin. 3. Use new  tape to make the bag stay on your leg. Wearing your bags You should have been given a large overnight bag.  You may wear the overnight bag in the day or night.  Always have the overnight bag lower than your bladder.  Do not let the bag touch the floor.  Before you go to sleep, put a clean plastic bag in a wastebasket. Then hang the overnight bag inside the wastebasket. You should also have a smaller leg bag that fits under your clothes.  Always wear the leg bag below your knee.  Do not wear your leg bag at night. How to care for your skin and catheter Supplies needed  A clean washcloth.  Water and mild soap.  A clean towel. Caring for your skin and catheter  Clean the skin around your catheter every day: 1. Wash your hands with soap and water. 2. Wet a clean washcloth in warm water and mild soap. 3. Clean the skin around your urethra.  If you are female:  Gently spread the folds of skin around your vagina (labia).  With the washcloth in your other hand, wipe the inner side of your labia on each  side. Wipe from front to back.  If you are female:  Pull back any skin that covers the end of your penis (foreskin).  With the washcloth in your other hand, wipe your penis in small circles. Start wiping at the tip of your penis, then move away from the catheter.  Move the foreskin back in place, if needed. 4. With your free hand, hold the catheter close to where it goes into your body.  Keep holding the catheter during cleaning so it does not get pulled out. 5. With the washcloth in your other hand, clean the catheter.  Only wipe downward on the catheter.  Do not wipe upward toward your body. Doing this may push germs into your urethra and cause infection. 6. Use a clean towel to pat-dry the catheter and the skin around it. Make sure to wipe off all soap. 7. Wash your hands with soap and water.  Shower every day. Do not take baths.  Do not use cream, ointment, or lotion on the area where the catheter goes into your body, unless your doctor tells you to.  Do not use powders, sprays, or lotions on your genital area.  Check your skin around the catheter every day for signs of infection. Check for: ? Redness, swelling, or pain. ? Fluid or blood. ? Warmth. ? Pus or a bad smell.      How to empty the bag Supplies needed  Rubbing alcohol.  Gauze pad or cotton ball.  Tape or a leg strap. Emptying the bag Pour the pee out of your bag when it is ?- full, or at least 2-3 times a day. Do this for your overnight bag and your leg bag. 1. Wash your hands with soap and water. 2. Separate (detach) the bag from your leg. 3. Hold the bag over the toilet or a clean pail. Keep the bag lower than your hips and bladder. This is so the pee (urine) does not go back into the tube. 4. Open the pour spout. It is at the bottom of the bag. 5. Empty the pee into the toilet or pail. Do not let the pour spout touch any surface. 6. Put rubbing alcohol on a gauze pad or cotton ball. 7. Use the gauze  pad or cotton ball to clean the pour spout.  8. Close the pour spout. 9. Attach the bag to your leg with tape or a leg strap. 10. Wash your hands with soap and water. Follow instructions for cleaning the drainage bag:  From the product maker.  As told by your doctor. How to change the bag Supplies needed  Alcohol wipes.  A clean bag.  Tape or a leg strap. Changing the bag Replace your bag when it starts to leak, smell bad, or look dirty. 1. Wash your hands with soap and water. 2. Separate the dirty bag from your leg. 3. Pinch the catheter with your fingers so that pee does not spill out. 4. Separate the catheter tube from the bag tube where these tubes connect (at the connection valve). Do not let the tubes touch any surface. 5. Clean the end of the catheter tube with an alcohol wipe. Use a different alcohol wipe to clean the end of the bag tube. 6. Connect the catheter tube to the tube of the clean bag. 7. Attach the clean bag to your leg with tape or a leg strap. Do not make the bag tight on your leg. 8. Wash your hands with soap and water. General rules  Never pull on your catheter. Never try to take it out. Doing that can hurt you.  Always wash your hands before and after you touch your catheter or bag. Use a mild, fragrance-free soap. If you do not have soap and water, use hand sanitizer.  Always make sure there are no twists or bends (kinks) in the catheter tube.  Always make sure there are no leaks in the catheter or bag.  Drink enough fluid to keep your pee pale yellow.  Do not take baths, swim, or use a hot tub.  If you are female, wipe from front to back after you poop (have a bowel movement).   Contact a doctor if:  Your pee is cloudy.  Your pee smells worse than usual.  Your catheter gets clogged.  Your catheter leaks.  Your bladder feels full. Get help right away if:  You have redness, swelling, or pain where the catheter goes into your body.  You  have fluid, blood, pus, or a bad smell coming from the area where the catheter goes into your body.  Your skin feels warm where the catheter goes into your body.  You have a fever.  You have pain in your: ? Belly (abdomen). ? Legs. ? Lower back. ? Bladder.  You see blood in the catheter.  Your pee is pink or red.  You feel sick to your stomach (nauseous).  You throw up (vomit).  You have chills.  Your pee is not draining into the bag.  Your catheter gets pulled out. Summary  An indwelling urinary catheter is a thin tube that is placed into the bladder to help drain pee (urine) out of the body.  The catheter is placed into the part of the body that drains pee from the bladder (urethra).  Taking good care of your catheter will keep it working properly and help prevent problems.  Always wash your hands before and after touching your catheter or bag.  Never pull on your catheter or try to take it out. This information is not intended to replace advice given to you by your health care provider. Make sure you discuss any questions you have with your health care provider. Document Revised: 05/10/2018 Document Reviewed: 09/01/2016 Elsevier Patient Education  [place].

## 2020-05-03 NOTE — Telephone Encounter (Signed)
Jordan Palmer underwent an anterior repair, midurethral sling and cystoscopy on 05/03/20.   She failed her voiding trial.  348ml was backfilled into the bladder She was unable to void.   She was discharged with a catheter. Please call her for a routine post op check and to schedule a voiding trial by Thurs 4/7 or Fri 4/8. Thanks!  Jaquita Folds, MD

## 2020-05-03 NOTE — Anesthesia Preprocedure Evaluation (Addendum)
Anesthesia Evaluation  Patient identified by MRN, date of birth, ID band Patient awake    Reviewed: Allergy & Precautions, NPO status , Patient's Chart, lab work & pertinent test results  History of Anesthesia Complications Negative for: history of anesthetic complications  Airway Mallampati: III  TM Distance: >3 FB Neck ROM: Full    Dental no notable dental hx. (+) Teeth Intact, Dental Advisory Given   Pulmonary sleep apnea and Continuous Positive Airway Pressure Ventilation , COPD,    Pulmonary exam normal breath sounds clear to auscultation       Cardiovascular hypertension, Pt. on medications (-) Past MI and (-) CHF Normal cardiovascular exam(-) dysrhythmias (-) Valvular Problems/Murmurs Rhythm:Regular Rate:Normal     Neuro/Psych neg Seizures Depression    GI/Hepatic neg GERD  ,  Endo/Other  neg diabetesHypothyroidism   Renal/GU      Musculoskeletal  (+) Arthritis , Osteoarthritis,    Abdominal   Peds  Hematology   Anesthesia Other Findings   Reproductive/Obstetrics                            Anesthesia Physical  Anesthesia Plan  ASA: II  Anesthesia Plan: General   Post-op Pain Management:    Induction: Intravenous  PONV Risk Score and Plan: 3 and Ondansetron and Dexamethasone  Airway Management Planned: Oral ETT and LMA  Additional Equipment: None  Intra-op Plan:   Post-operative Plan: Extubation in OR  Informed Consent: I have reviewed the patients History and Physical, chart, labs and discussed the procedure including the risks, benefits and alternatives for the proposed anesthesia with the patient or authorized representative who has indicated his/her understanding and acceptance.       Plan Discussed with: Anesthesiologist and CRNA  Anesthesia Plan Comments: (  )        Anesthesia Quick Evaluation

## 2020-05-04 ENCOUNTER — Encounter: Payer: Self-pay | Admitting: Obstetrics and Gynecology

## 2020-05-04 ENCOUNTER — Encounter (HOSPITAL_BASED_OUTPATIENT_CLINIC_OR_DEPARTMENT_OTHER): Payer: Self-pay | Admitting: Obstetrics and Gynecology

## 2020-05-04 ENCOUNTER — Ambulatory Visit: Payer: PRIVATE HEALTH INSURANCE | Admitting: Obstetrics and Gynecology

## 2020-05-04 NOTE — Telephone Encounter (Signed)
Post- Op Call  Gailen Shelter underwent anterior repair, midurethral sling, and cystoscopy on 05/06/20 with Dr Wannetta Sender. The patient reports that her pain is controlled. She is taking tylenol and ibuprofen, alternating. She denies vaginal bleeding. She has not had a bowel movement and is taking generic laxative for a bowel regimen. She was discharged with a catheter and will return on Thursday 05/06/20 for a voiding trial.   Gaylyn Rong, RN

## 2020-05-05 ENCOUNTER — Ambulatory Visit: Payer: Medicare Other | Admitting: Obstetrics and Gynecology

## 2020-05-06 ENCOUNTER — Encounter: Payer: Self-pay | Admitting: Obstetrics and Gynecology

## 2020-05-06 ENCOUNTER — Ambulatory Visit (INDEPENDENT_AMBULATORY_CARE_PROVIDER_SITE_OTHER): Payer: Medicare Other | Admitting: Obstetrics and Gynecology

## 2020-05-06 ENCOUNTER — Other Ambulatory Visit: Payer: Self-pay

## 2020-05-06 VITALS — BP 135/79 | HR 73 | Ht 61.0 in | Wt 144.0 lb

## 2020-05-06 DIAGNOSIS — Z9889 Other specified postprocedural states: Secondary | ICD-10-CM

## 2020-05-06 NOTE — Progress Notes (Signed)
Urogyn Voiding Trial Note  Jordan Palmer underwent anterior repair, midurethral sling and cystoscopy on 05/03/20.  She presents today for a voiding trial .   Vitals:   05/06/20 1024  BP: 135/79  Pulse: 73   245ml of NS was instilled into the bladder via the catheter. The catheter was removed and patient was instructed to void into the urinary hat. She voided 138ml. The post void residual measured by bladder scan was 244ml. She failed the voiding trial. She prefers to self-catheterize rather than have the foley catheter replaced. She was taught catheterization and will record amounts voided and catheterized. We will follow up with her tomorrow to review cath amounts.   Jordan Folds, MD

## 2020-05-07 ENCOUNTER — Telehealth: Payer: Self-pay | Admitting: *Deleted

## 2020-05-07 ENCOUNTER — Other Ambulatory Visit: Payer: Self-pay | Admitting: Internal Medicine

## 2020-05-07 DIAGNOSIS — Z1231 Encounter for screening mammogram for malignant neoplasm of breast: Secondary | ICD-10-CM

## 2020-05-07 NOTE — Telephone Encounter (Signed)
DOB verified. Called pt to obtain urinary cath measurements. Pt stated that she voided 14ml and then got out 72ml via catheter. Pt states the next few times she voided between 100-200 and got out a very little amount with the catheter. Pt states that she stopped using the catheter after the 3rd time of getting such a small amount in return. Advised pt that she could discontinue the use of the catheter. Advised to call us back if she has any issues between now and her post op appt. Pt verbalized understanding.

## 2020-05-10 ENCOUNTER — Ambulatory Visit
Admission: RE | Admit: 2020-05-10 | Discharge: 2020-05-10 | Disposition: A | Discharge status: Home / Self Care | Payer: Medicare Other | Source: Ambulatory Visit | Attending: Internal Medicine | Admitting: Internal Medicine

## 2020-05-10 ENCOUNTER — Other Ambulatory Visit: Payer: Self-pay

## 2020-05-10 DIAGNOSIS — Z1231 Encounter for screening mammogram for malignant neoplasm of breast: Secondary | ICD-10-CM | POA: Insufficient documentation

## 2020-06-04 ENCOUNTER — Encounter: Payer: Self-pay | Admitting: Obstetrics and Gynecology

## 2020-06-04 ENCOUNTER — Ambulatory Visit (INDEPENDENT_AMBULATORY_CARE_PROVIDER_SITE_OTHER): Payer: Medicare Other | Admitting: Obstetrics and Gynecology

## 2020-06-04 ENCOUNTER — Other Ambulatory Visit: Payer: Self-pay

## 2020-06-04 VITALS — BP 131/83 | HR 71 | Wt 144.0 lb

## 2020-06-04 DIAGNOSIS — Z9889 Other specified postprocedural states: Secondary | ICD-10-CM

## 2020-06-04 NOTE — Progress Notes (Signed)
[facility]  Date of Visit: 06/04/2020  History of Present Illness: Ms. Chaplin is a 70 y.o. female scheduled today for a post-operative visit.   Surgery: s/p anterior repair, midurethral sling and cystoscopy on 05/03/20.   She did not pass her postoperative void trial. She returned 05/06/20 for a repeat voiding trial and failed again. She was taught to self-catheterize. After follow up, she was getting very little urine with self-catheterzing and stopped.  Postoperative course has otherwise been uncomplicated.   Today she reports every once in a while notices some yellow on her pad- she thinks it may be urine but could also be some vaginal discharge. Feels something hard at the edge of her vagina.  UTI in the last 6 weeks? No  Pain? No  She has not returned to her normal activity. Vaginal bulge? No  Stress incontinence: No  Urgency/frequency: No  Urge incontinence: Yes , occasional Voiding dysfunction: No  Bowel issues: No , has been taking the miralax so she does not strain  Subjective Success: Do you usually have a bulge or something falling out that you can see or feel in the vaginal area? No  Retreatment Success: Any retreatment with surgery or pessary for any compartment? No   Medications: She has a current medication list which includes the following prescription(s): acetaminophen, celecoxib, premarin, fexofenadine, fluticasone, levothyroxine, lisinopril-hydrochlorothiazide, PRESCRIPTION MEDICATION, rosuvastatin, and scar treatment products.   Allergies: Patient is allergic to codeine and doxycycline.   Physical Exam: BP 131/83   Pulse 71   Wt 144 lb (65.3 kg)   BMI 27.21 kg/m   Abdomen: soft, non-tender, without masses or organomegaly Suprapubic Incisions: healing well.  Pelvic Examination: Vagina: Incisions healing well, no significant drainage. Sutures are present at incision line and there is not granulation tissue. No tenderness along the anterior or  posterior vagina. No apical tenderness. No pelvic masses. No visible or palpable mesh.  POP-Q: POP-Q  -3                                            Aa   -3                                           Ba  -8.5                                              C   3                                            Gh  2                                            Pb  9  tvl   -2                                            Ap  -2                                            Bp                                                 D    ---------------------------------------------------------  Assessment and Plan:  1. Post-operative state     - The patient was given a copy of her operative note for her records. - Can resume regular activity including exercise. Wait an additional two weeks for intercourse due to presence of sutures. - Discussed avoidance of heavy lifting and straining long term to reduce the risk of recurrence.   All questions answered. Follow up as needed.   Jaquita Folds, MD

## 2020-06-04 NOTE — Patient Instructions (Signed)
Avoid of heavy lifting and straining long term to reduce the risk of recurrence.   Wait 2 weeks before having intercourse or swimming.

## 2020-06-15 ENCOUNTER — Encounter: Payer: Self-pay | Admitting: Obstetrics and Gynecology

## 2020-06-21 ENCOUNTER — Ambulatory Visit
Admission: EM | Admit: 2020-06-21 | Discharge: 2020-06-21 | Disposition: A | Discharge status: Home / Self Care | Payer: Medicare Other | Attending: Internal Medicine | Admitting: Internal Medicine

## 2020-06-21 ENCOUNTER — Ambulatory Visit (INDEPENDENT_AMBULATORY_CARE_PROVIDER_SITE_OTHER): Payer: Medicare Other

## 2020-06-21 ENCOUNTER — Other Ambulatory Visit: Payer: Self-pay

## 2020-06-21 DIAGNOSIS — J9801 Acute bronchospasm: Secondary | ICD-10-CM

## 2020-06-21 DIAGNOSIS — J069 Acute upper respiratory infection, unspecified: Secondary | ICD-10-CM | POA: Diagnosis not present

## 2020-06-21 DIAGNOSIS — R059 Cough, unspecified: Secondary | ICD-10-CM

## 2020-06-21 MED ORDER — ALBUTEROL SULFATE HFA 108 (90 BASE) MCG/ACT IN AERS: 2.0000 | INHALATION_SPRAY | RESPIRATORY_TRACT | 0 refills | Status: DC | PRN

## 2020-06-21 MED ORDER — BENZONATATE 200 MG PO CAPS: 200.0000 mg | ORAL_CAPSULE | Freq: Two times a day (BID) | ORAL | 0 refills | Status: DC | PRN

## 2020-06-21 MED ORDER — METHYLPREDNISOLONE 4 MG PO TBPK: ORAL_TABLET | ORAL | 0 refills | Status: DC

## 2020-06-21 NOTE — ED Triage Notes (Signed)
Pt c/o cough, nasal congestion, sneezing and fatigue for over 2 weeks. Pt has been taking OTC cough/cold medication with some improvement, but her symptoms have not resolved. Pt denies f/n/v/d or other symptoms. Pt reports clear mucus when blowing her nose.

## 2020-06-21 NOTE — ED Provider Notes (Signed)
[facility]    CSN: [id number] Arrival date & time: 06/21/20  1049      History   Chief Complaint Chief Complaint  Patient presents with  . Cough  . Nasal Congestion    HPI Jordan Palmer is a 70 y.o. female who woke up 2 weeks ago fatigued and with a cough, and later developed nose congestion and rhinitis. Has taken her allergy med but has not helped like it usually does. Denies fever, chills, sweats, SOB or GI symptoms. Her cough is productive with clear mucous. The rhinitis is with clear mucous.     Past Medical History:  Diagnosis Date  . Arthritis    oa knees  . Cystocele with prolapse   . Hyperlipemia   . Hypertension   . Hypothyroidism   . Sleep apnea    cpap set on 25  . SUI (stress urinary incontinence, female)   . Wears contact lenses     Patient Active Problem List   Diagnosis Date Noted  . S/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, left 12/23/2019  . S/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, right 05/20/2019  . H/O total vaginal hysterectomy 03/12/2017  . Essential hypertension 03/08/2017  . Mixed dyslipidemia 03/08/2017  . Abnormal EKG 03/08/2017  . Preoperative cardiovascular examination 03/08/2017  . Allergic state 12/11/2013  . High cholesterol 12/11/2013  . History of depression 12/11/2013  . Hypothyroidism 12/11/2013  . Sleep apnea 12/11/2013    Past Surgical History:  Procedure Laterality Date  . ABDOMINAL HYSTERECTOMY    . ANTERIOR AND POSTERIOR REPAIR N/A 03/12/2017   Procedure: ANTERIOR (CYSTOCELE) AND POSTERIOR REPAIR (RECTOCELE);  Surgeon: Salvadore Dom, MD;  Location: [facility];  Service: Gynecology;  Laterality: N/A;  . BLADDER SUSPENSION N/A 05/03/2020   Procedure: TRANSVAGINAL TAPE (TVT) PROCEDURE;  Surgeon: Jaquita Folds, MD;  Location: [facility];  Service: Gynecology;  Laterality: N/A;  . CARPAL TUNNEL RELEASE Bilateral yrs ago  . CHOLECYSTECTOMY  2001    laparoscopic  . CYSTOCELE REPAIR N/A 05/03/2020   Procedure: ANTERIOR REPAIR (CYSTOCELE);  Surgeon: Jaquita Folds, MD;  Location: [facility];  Service: Gynecology;  Laterality: N/A;  total time needed for 3 procedures is 1.5hrs  . CYSTOSCOPY N/A 03/12/2017   Procedure: CYSTOSCOPY;  Surgeon: Salvadore Dom, MD;  Location: [facility];  Service: Gynecology;  Laterality: N/A;  . CYSTOSCOPY N/A 05/03/2020   Procedure: CYSTOSCOPY;  Surgeon: Jaquita Folds, MD;  Location: [facility];  Service: Gynecology;  Laterality: N/A;  . KNEE ARTHROSCOPY Left 12/2019  . KNEE ARTHROSCOPY WITH MEDIAL MENISECTOMY Right 02/13/2019   Procedure: KNEE ARTHROSCOPY WITH MEDIAL MENISECTOMY;  Surgeon: Thornton Park, MD;  Location: [facility];  Service: Orthopedics;  Laterality: Right;  . LACRIMAL DUCT PROBING W/ DACRYOPLASTY Bilateral   . SALPINGOOPHORECTOMY Left 03/12/2017   Procedure: SALPINGECTOMY;  Surgeon: Salvadore Dom, MD;  Location: [facility];  Service: Gynecology;  Laterality: Left;  . SHOULDER ARTHROSCOPY WITH OPEN ROTATOR CUFF REPAIR Right 09/03/2014   Procedure: SHOULDER ARTHROSCOPY WITH OPEN ROTATOR CUFF REPAIR;  Surgeon: Thornton Park, MD;  Location: [facility];  Service: Orthopedics;  Laterality: Right;  . THYROIDECTOMY Right yrs ago   hemi-thyroidectomy  . TOTAL KNEE ARTHROPLASTY Right 05/20/2019   Procedure: TOTAL KNEE ARTHROPLASTY;  Surgeon: Thornton Park, MD;  Location: [facility];  Service: Orthopedics;  Laterality: Right;  . TOTAL KNEE ARTHROPLASTY Left 12/23/2019   Procedure: Left  TOTAL KNEE ARTHROPLASTY;  Surgeon: Thornton Park, MD;  Location: [facility];  Service: Orthopedics;  Laterality: Left;  Marland Kitchen VAGINAL HYSTERECTOMY N/A 03/12/2017   Procedure: HYSTERECTOMY VAGINAL;  Surgeon: Salvadore Dom, MD;  Location: [facility];  Service: Gynecology;  Laterality: N/A;    OB History    Gravida  1    Para  1   Term  1   Preterm      AB      Living  1     SAB      IAB      Ectopic      Multiple      Live Births  1            Home Medications    Prior to Admission medications   Medication Sig Start Date End Date Taking? Authorizing Provider  acetaminophen (TYLENOL) 500 MG tablet Take 1,000 mg by mouth 2 (two) times daily as needed for moderate pain. For after surgery   Yes [provider]  albuterol (VENTOLIN HFA) 108 (90 Base) MCG/ACT inhaler Inhale 2 puffs into the lungs every 4 (four) hours as needed for wheezing or shortness of breath. 06/21/20  Yes Rodriguez-Southworth, Sunday Spillers, PA-C  benzonatate (TESSALON) 200 MG capsule Take 1 capsule (200 mg total) by mouth 2 (two) times daily as needed for cough. 06/21/20  Yes Rodriguez-Southworth, Sunday Spillers, PA-C  celecoxib (CELEBREX) 200 MG capsule Take 200 mg by mouth daily. 11/26/19  Yes [provider]  conjugated estrogens (PREMARIN) vaginal cream 1/2 gram vaginally twice weekly Patient taking differently: Place 1 Applicatorful vaginally 2 (two) times a week. 01/13/19  Yes Salvadore Dom, MD  fexofenadine (ALLEGRA) 180 MG tablet Take 180 mg by mouth daily as needed for allergies.   Yes [provider]  levothyroxine (SYNTHROID) 88 MCG tablet Take 88 mcg by mouth daily before breakfast.   Yes [provider]  lisinopril-hydrochlorothiazide (PRINZIDE,ZESTORETIC) 20-25 MG per tablet Take 1 tablet by mouth daily.   Yes [provider]  methylPREDNISolone (MEDROL DOSEPAK) 4 MG TBPK tablet Take as directed 06/21/20  Yes Rodriguez-Southworth, Sunday Spillers, PA-C  PRESCRIPTION MEDICATION Cyanocobalamin 1000 mcg vitamin b12 1 shot q week x 4 weeks , then will go to pill   Yes [provider]  rosuvastatin (CRESTOR) 5 MG tablet Take 5 mg by mouth daily at 2 PM. Takes at 200 pm 07/01/15  Yes [provider]  Scar Treatment Products (SCAR GEL EX) Apply 1 application topically daily.    Yes [provider]  fluticasone (FLONASE) 50 MCG/ACT nasal spray Place 1 spray into both nostrils daily as needed for allergies.    [provider]    Family History Family History  Problem Relation Age of Onset  . Diabetes type II Father   . CAD Father   . Alzheimer's disease Father   . Alzheimer's disease Mother   . Breast cancer Neg Hx     Social History Social History   Tobacco Use  . Smoking status: Never Smoker  . Smokeless tobacco: Never Used  Vaping Use  . Vaping Use: Never used  Substance Use Topics  . Alcohol use: Yes    Comment: occ   . Drug use: No     Allergies   Codeine and Doxycycline   Review of Systems Review of Systems  Constitutional: Negative for activity change, chills, diaphoresis, fatigue and fever.  HENT: Positive for congestion, postnasal drip and rhinorrhea. Negative for ear discharge, ear pain  and sore throat.   Eyes: Negative for discharge.  Respiratory: Positive for cough. Negative for chest tightness and shortness of breath.   Cardiovascular: Negative for chest pain.  Gastrointestinal: Negative for diarrhea, nausea and vomiting.  Musculoskeletal: Negative for myalgias.  Skin: Negative for wound.  Neurological: Negative for headaches.  Hematological: Negative for adenopathy.     Physical Exam Triage Vital Signs ED Triage Vitals  Enc Vitals Group     BP 06/21/20 1140 121/74     Pulse Rate 06/21/20 1140 68     Resp 06/21/20 1140 18     Temp 06/21/20 1140 98.5 F (36.9 C)     Temp Source 06/21/20 1140 Oral     SpO2 06/21/20 1140 100 %     Weight 06/21/20 1138 144 lb (65.3 kg)     Height 06/21/20 1138 5' (1.524 m)     Head Circumference --      Peak Flow --      Pain Score 06/21/20 1137 0     Pain Loc --      Pain Edu? --      Excl. in [place]? --    No data found.  Updated Vital Signs BP 121/74 (BP Location: Left Arm)   Pulse 68   Temp 98.5 F (36.9 C) (Oral)   Resp 18   Ht 5' (1.524 m)   Wt 144 lb  (65.3 kg)   SpO2 100%   BMI 28.12 kg/m   Visual Acuity Right Eye Distance:   Left Eye Distance:   Bilateral Distance:    Right Eye Near:   Left Eye Near:    Bilateral Near:     Physical Exam Alert pt NAD who seems nasally congested EYES- non icterus, mild watering, no purulent drainage NOSE- moderate mucosa congestion which is pale pink with clear mucous. No sinus tenderness TM- both gray and little dull, canals are normal PHARYNX- clear, clear drainage noted.  NECK- supple with no nodes LUNGS- she was coughing a lot in the room, lung sounds aer clear HEART - RRR with no murmurs SKIN- non jaundiced, no rashes.    UC Treatments / Results  Labs (all labs ordered are listed, but only abnormal results are displayed) Labs Reviewed - No data to display  EKG   Radiology DG Chest 2 View  Result Date: 06/21/2020 CLINICAL DATA:  Cough, sinus congestion, sneezing and fatigue for 2 weeks. EXAM: CHEST - 2 VIEW COMPARISON:  PA and lateral chest 11/14/2010. FINDINGS: Lungs clear. Heart size upper normal. No pneumothorax or pleural fluid. No acute or focal bony abnormality. IMPRESSION: No acute disease. Electronically Signed   By: Inge Rise M.D.   On: 06/21/2020 12:22    Procedures Procedures (including critical care time)  Medications Ordered in UC Medications - No data to display  Initial Impression / Assessment and Plan / UC Course  I have reviewed the triage vital signs and the nursing notes. Pertinent  imaging results that were available during my care of the patient were reviewed by me and considered in my medical decision making (see chart for details). Has allergic bronchitis. I placed her on Tessalon, albuterol inhaler and medrol dose pack.   Final Clinical Impressions(s) / UC Diagnoses   Final diagnoses:  Viral URI with cough  Bronchospasm     Discharge Instructions     Your chest xray does not show signs of bronchitis or pneumonia therefore you dont need  an antibiotic.  I am placing you on  an inhaler to help with cough attacks which are caused from bronchospasm and Prednisone pack.     ED Prescriptions    Medication Sig Dispense Auth. Provider   benzonatate (TESSALON) 200 MG capsule Take 1 capsule (200 mg total) by mouth 2 (two) times daily as needed for cough. 30 capsule Rodriguez-Southworth, Sunday Spillers, PA-C   methylPREDNISolone (MEDROL DOSEPAK) 4 MG TBPK tablet Take as directed 21 tablet Rodriguez-Southworth, Sunday Spillers, PA-C   albuterol (VENTOLIN HFA) 108 (90 Base) MCG/ACT inhaler Inhale 2 puffs into the lungs every 4 (four) hours as needed for wheezing or shortness of breath. 18 g Rodriguez-Southworth, Sunday Spillers, PA-C     PDMP not reviewed this encounter.   Shelby Mattocks, [place] 06/21/20 1644

## 2020-06-21 NOTE — Discharge Instructions (Addendum)
Your chest xray does not show signs of bronchitis or pneumonia therefore you dont need an antibiotic.  I am placing you on an inhaler to help with cough attacks which are caused from bronchospasm and Prednisone pack.

## 2020-06-24 ENCOUNTER — Other Ambulatory Visit: Payer: Self-pay

## 2020-06-24 ENCOUNTER — Ambulatory Visit
Admission: EM | Admit: 2020-06-24 | Discharge: 2020-06-24 | Disposition: A | Discharge status: Home / Self Care | Payer: Medicare Other | Attending: Family Medicine | Admitting: Family Medicine

## 2020-06-24 ENCOUNTER — Encounter: Payer: Self-pay | Admitting: Emergency Medicine

## 2020-06-24 DIAGNOSIS — R109 Unspecified abdominal pain: Secondary | ICD-10-CM | POA: Diagnosis not present

## 2020-06-24 MED ORDER — NAPROXEN 375 MG PO TABS: 375.0000 mg | ORAL_TABLET | Freq: Two times a day (BID) | ORAL | 0 refills | Status: DC | PRN

## 2020-06-24 NOTE — ED Triage Notes (Signed)
Pt c/o pain in her right lower abdomen. She states the pain is only when she coughs. She was seen on 06/21/20 and given medications for a the cough and cold. She states is feeling better.

## 2020-06-24 NOTE — Discharge Instructions (Signed)
Rest.  Medication if needed.  Tylenol as needed.  If you worsen, please let us know.  Take care  Dr. Lacinda Axon

## 2020-06-24 NOTE — ED Provider Notes (Signed)
[facility]    CSN: [id number] Arrival date & time: 06/24/20  0834      History   Chief Complaint Chief Complaint  Patient presents with  . Cough  . Muscle Pain   HPI  70 year old female presents with the above complaints.  Recently seen for viral URI with cough.  Was treated with corticosteroids, albuterol, and Tessalon Perles.  She states that her cough is improved.  However, she states that she is having right lower quadrant pain particular when she coughs.  She seems to be fed when she is not coughing.  Pain is 7/10 in severity.  No fever.  She has taken Tylenol with some improvement as well.   Past Medical History:  Diagnosis Date  . Arthritis    oa knees  . Cystocele with prolapse   . Hyperlipemia   . Hypertension   . Hypothyroidism   . Sleep apnea    cpap set on 25  . SUI (stress urinary incontinence, female)   . Wears contact lenses     Patient Active Problem List   Diagnosis Date Noted  . S/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, left 12/23/2019  . S/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, right 05/20/2019  . H/O total vaginal hysterectomy 03/12/2017  . Essential hypertension 03/08/2017  . Mixed dyslipidemia 03/08/2017  . Abnormal EKG 03/08/2017  . Preoperative cardiovascular examination 03/08/2017  . Allergic state 12/11/2013  . High cholesterol 12/11/2013  . History of depression 12/11/2013  . Hypothyroidism 12/11/2013  . Sleep apnea 12/11/2013    Past Surgical History:  Procedure Laterality Date  . ABDOMINAL HYSTERECTOMY    . ANTERIOR AND POSTERIOR REPAIR N/A 03/12/2017   Procedure: ANTERIOR (CYSTOCELE) AND POSTERIOR REPAIR (RECTOCELE);  Surgeon: Salvadore Dom, MD;  Location: [facility];  Service: Gynecology;  Laterality: N/A;  . BLADDER SUSPENSION N/A 05/03/2020   Procedure: TRANSVAGINAL TAPE (TVT) PROCEDURE;  Surgeon: Jaquita Folds, MD;  Location: [facility];  Service: Gynecology;   Laterality: N/A;  . CARPAL TUNNEL RELEASE Bilateral yrs ago  . CHOLECYSTECTOMY  2001   laparoscopic  . CYSTOCELE REPAIR N/A 05/03/2020   Procedure: ANTERIOR REPAIR (CYSTOCELE);  Surgeon: Jaquita Folds, MD;  Location: [facility];  Service: Gynecology;  Laterality: N/A;  total time needed for 3 procedures is 1.5hrs  . CYSTOSCOPY N/A 03/12/2017   Procedure: CYSTOSCOPY;  Surgeon: Salvadore Dom, MD;  Location: [facility];  Service: Gynecology;  Laterality: N/A;  . CYSTOSCOPY N/A 05/03/2020   Procedure: CYSTOSCOPY;  Surgeon: Jaquita Folds, MD;  Location: [facility];  Service: Gynecology;  Laterality: N/A;  . KNEE ARTHROSCOPY Left 12/2019  . KNEE ARTHROSCOPY WITH MEDIAL MENISECTOMY Right 02/13/2019   Procedure: KNEE ARTHROSCOPY WITH MEDIAL MENISECTOMY;  Surgeon: Thornton Park, MD;  Location: [facility];  Service: Orthopedics;  Laterality: Right;  . LACRIMAL DUCT PROBING W/ DACRYOPLASTY Bilateral   . SALPINGOOPHORECTOMY Left 03/12/2017   Procedure: SALPINGECTOMY;  Surgeon: Salvadore Dom, MD;  Location: [facility];  Service: Gynecology;  Laterality: Left;  . SHOULDER ARTHROSCOPY WITH OPEN ROTATOR CUFF REPAIR Right 09/03/2014   Procedure: SHOULDER ARTHROSCOPY WITH OPEN ROTATOR CUFF REPAIR;  Surgeon: Thornton Park, MD;  Location: [facility];  Service: Orthopedics;  Laterality: Right;  . THYROIDECTOMY Right yrs ago   hemi-thyroidectomy  . TOTAL KNEE ARTHROPLASTY Right 05/20/2019   Procedure: TOTAL KNEE ARTHROPLASTY;  Surgeon: Thornton Park, MD;  Location: [facility];  Service: Orthopedics;  Laterality: Right;  . TOTAL KNEE ARTHROPLASTY Left 12/23/2019   Procedure: Left TOTAL KNEE ARTHROPLASTY;  Surgeon: Thornton Park, MD;  Location: [facility];  Service: Orthopedics;  Laterality: Left;  Marland Kitchen VAGINAL HYSTERECTOMY N/A 03/12/2017   Procedure: HYSTERECTOMY VAGINAL;  Surgeon: Salvadore Dom, MD;  Location: [facility];  Service: Gynecology;  Laterality: N/A;    OB History    Gravida  1   Para  1   Term  1   Preterm      AB      Living  1     SAB      IAB      Ectopic      Multiple      Live Births  1            Home Medications    Prior to Admission medications   Medication Sig Start Date End Date Taking? Authorizing Provider  acetaminophen (TYLENOL) 500 MG tablet Take 1,000 mg by mouth 2 (two) times daily as needed for moderate pain. For after surgery   Yes [provider]  albuterol (VENTOLIN HFA) 108 (90 Base) MCG/ACT inhaler Inhale 2 puffs into the lungs every 4 (four) hours as needed for wheezing or shortness of breath. 06/21/20  Yes Rodriguez-Southworth, Sunday Spillers, PA-C  benzonatate (TESSALON) 200 MG capsule Take 1 capsule (200 mg total) by mouth 2 (two) times daily as needed for cough. 06/21/20  Yes Rodriguez-Southworth, Sunday Spillers, PA-C  celecoxib (CELEBREX) 200 MG capsule Take 200 mg by mouth daily. 11/26/19  Yes [provider]  conjugated estrogens (PREMARIN) vaginal cream 1/2 gram vaginally twice weekly Patient taking differently: Place 1 Applicatorful vaginally 2 (two) times a week. 01/13/19  Yes Salvadore Dom, MD  fexofenadine (ALLEGRA) 180 MG tablet Take 180 mg by mouth daily as needed for allergies.   Yes [provider]  fluticasone (FLONASE) 50 MCG/ACT nasal spray Place 1 spray into both nostrils daily as needed for allergies.   Yes [provider]  levothyroxine (SYNTHROID) 88 MCG tablet Take 88 mcg by mouth daily before breakfast.   Yes [provider]  lisinopril-hydrochlorothiazide (PRINZIDE,ZESTORETIC) 20-25 MG per tablet Take 1 tablet by mouth daily.   Yes [provider]  methylPREDNISolone (MEDROL DOSEPAK) 4 MG TBPK tablet Take as directed 06/21/20  Yes Rodriguez-Southworth, Sunday Spillers, PA-C  naproxen (NAPROSYN) 375 MG tablet Take 1 tablet (375 mg total) by mouth 2 (two) times daily as needed for  moderate pain. 06/24/20  Yes Karmella Bouvier G, DO  PRESCRIPTION MEDICATION Cyanocobalamin 1000 mcg vitamin b12 1 shot q week x 4 weeks , then will go to pill   Yes [provider]  rosuvastatin (CRESTOR) 5 MG tablet Take 5 mg by mouth daily at 2 PM. Takes at 200 pm 07/01/15  Yes [provider]  Scar Treatment Products (SCAR GEL EX) Apply 1 application topically daily.   Yes [provider]    Family History Family History  Problem Relation Age of Onset  . Diabetes type II Father   . CAD Father   . Alzheimer's disease Father   . Alzheimer's disease Mother   . Breast cancer Neg Hx     Social History Social History   Tobacco Use  . Smoking status: Never Smoker  . Smokeless tobacco: Never Used  Vaping Use  . Vaping Use: Never used  Substance Use Topics  . Alcohol use: Yes    Comment: occ   . Drug  use: No     Allergies   Codeine and Doxycycline   Review of Systems Review of Systems  Constitutional: Negative for fever.  Respiratory: Positive for cough.   Gastrointestinal: Positive for abdominal pain.   Physical Exam Triage Vital Signs ED Triage Vitals  Enc Vitals Group     BP 06/24/20 0906 132/80     Pulse Rate 06/24/20 0906 68     Resp 06/24/20 0906 18     Temp 06/24/20 0906 98.2 F (36.8 C)     Temp Source 06/24/20 0906 Oral     SpO2 06/24/20 0906 100 %     Weight 06/24/20 0907 143 lb 15.4 oz (65.3 kg)     Height 06/24/20 0907 5' (1.524 m)     Head Circumference --      Peak Flow --      Pain Score 06/24/20 0906 7     Pain Loc --      Pain Edu? --      Excl. in [place]? --    Updated Vital Signs BP 132/80 (BP Location: Right Arm)   Pulse 68   Temp 98.2 F (36.8 C) (Oral)   Resp 18   Ht 5' (1.524 m)   Wt 65.3 kg   SpO2 100%   BMI 28.12 kg/m   Visual Acuity Right Eye Distance:   Left Eye Distance:   Bilateral Distance:    Right Eye Near:   Left Eye Near:    Bilateral Near:     Physical Exam Vitals and nursing note reviewed.   Constitutional:      General: She is not in acute distress.    Appearance: Normal appearance. She is not ill-appearing.  HENT:     Head: Normocephalic and atraumatic.  Cardiovascular:     Rate and Rhythm: Normal rate and regular rhythm.     Heart sounds: No murmur heard.   Pulmonary:     Effort: Pulmonary effort is normal.     Breath sounds: Normal breath sounds. No wheezing, rhonchi or rales.  Abdominal:     General: There is no distension.     Palpations: Abdomen is soft.     Tenderness: There is no abdominal tenderness.  Neurological:     Mental Status: She is alert.  Psychiatric:        Mood and Affect: Mood normal.        Behavior: Behavior normal.     UC Treatments / Results  Labs (all labs ordered are listed, but only abnormal results are displayed) Labs Reviewed - No data to display  EKG   Radiology No results found.  Procedures Procedures (including critical care time)  Medications Ordered in UC Medications - No data to display  Initial Impression / Assessment and Plan / UC Course  I have reviewed the triage vital signs and the nursing notes.  Pertinent labs & imaging results that were available during my care of the patient were reviewed by me and considered in my medical decision making (see chart for details).    70 year old female presents with abdominal wall pain.  This is muscular in nature.  Worse with cough and lying down on exam table.  Advised Tylenol as needed.  Naproxen as directed.  Supportive care.  Final Clinical Impressions(s) / UC Diagnoses   Final diagnoses:  Abdominal wall pain     Discharge Instructions     Rest.  Medication if needed.  Tylenol as needed.  If you worsen, please let  us know.  Take care  Dr. Lacinda Axon    ED Prescriptions    Medication Sig Dispense Auth. Provider   naproxen (NAPROSYN) 375 MG tablet Take 1 tablet (375 mg total) by mouth 2 (two) times daily as needed for moderate pain. 20 tablet Coral Spikes, DO     PDMP not reviewed this encounter.   Thersa Salt [place], [place] 06/24/20 [phone]

## 2020-08-23 ENCOUNTER — Other Ambulatory Visit: Payer: Self-pay

## 2020-08-23 ENCOUNTER — Ambulatory Visit (INDEPENDENT_AMBULATORY_CARE_PROVIDER_SITE_OTHER): Payer: Medicare Other | Admitting: Obstetrics and Gynecology

## 2020-08-23 ENCOUNTER — Encounter: Payer: Self-pay | Admitting: Obstetrics and Gynecology

## 2020-08-23 VITALS — BP 126/81 | HR 75 | Wt 155.0 lb

## 2020-08-23 DIAGNOSIS — R350 Frequency of micturition: Secondary | ICD-10-CM

## 2020-08-23 DIAGNOSIS — R3915 Urgency of urination: Secondary | ICD-10-CM | POA: Diagnosis not present

## 2020-08-23 LAB — POCT URINALYSIS DIPSTICK
Appearance: NEGATIVE
Bilirubin, UA: NEGATIVE
Blood, UA: NEGATIVE
Glucose, UA: NEGATIVE
Ketones, UA: NEGATIVE
Nitrite, UA: NEGATIVE
Protein, UA: NEGATIVE
Spec Grav, UA: >=1.03 — AB (ref 1.010–1.025)
Urobilinogen, UA: 0.2 E.U./dL
pH, UA: 5.5 (ref 5.0–8.0)

## 2020-08-23 NOTE — Progress Notes (Signed)
[facility] Return Visit  SUBJECTIVE  History of Present Illness: Jordan Palmer is a 70 y.o. female seen in follow-up for urinary urgency.   She is s/p anterior repair, midurethral sling and cystoscopy on 05/03/20.   For the last 3 weeks, sometimes she is having some urgency then feels like she cannot empty. Stream is very slow with sitting. Feels like she needs to go more often. Wakes up 3-4 times per night some nights and other nights not at all. Feels like something is there with wiping but does not feel prolapse has returned.   Drinks: 1/2 cup coffee, Diet Mt dew, water with flavor occasionally, vodka occasionally.  Past Medical History: Patient  has a past medical history of Arthritis, Cystocele with prolapse, Hyperlipemia, Hypertension, Hypothyroidism, Sleep apnea, SUI (stress urinary incontinence, female), and Wears contact lenses.   Past Surgical History: She  has a past surgical history that includes Lacrimal duct probing w/ dacryoplasty (Bilateral); Knee arthroscopy (Left, 12/2019); Carpal tunnel release (Bilateral, yrs ago); Thyroidectomy (Right, yrs ago); Shoulder arthroscopy with open rotator cuff repair (Right, 09/03/2014); Vaginal hysterectomy (N/A, 03/12/2017); Salpingoophorectomy (Left, 03/12/2017); Anterior and posterior repair (N/A, 03/12/2017); Cystoscopy (N/A, 03/12/2017); Knee arthroscopy with medial menisectomy (Right, 02/13/2019); Total knee arthroplasty (Right, 05/20/2019); Total knee arthroplasty (Left, 12/23/2019); Cholecystectomy (2001); Cystocele repair (N/A, 05/03/2020); Bladder suspension (N/A, 05/03/2020); Cystoscopy (N/A, 05/03/2020); and Abdominal hysterectomy.   Medications: She has a current medication list which includes the following prescription(s): acetaminophen, albuterol, benzonatate, celecoxib, premarin, fexofenadine, fluticasone, levothyroxine, lisinopril-hydrochlorothiazide, methylprednisolone, naproxen, PRESCRIPTION MEDICATION, rosuvastatin, and  scar treatment products.   Allergies: Patient is allergic to codeine and doxycycline.   Social History: Patient  reports that she has never smoked. She has never used smokeless tobacco. She reports current alcohol use. She reports that she does not use drugs.      OBJECTIVE     Physical Exam: Vitals:   08/23/20 1254  BP: 126/81  Pulse: 75  Weight: 155 lb (70.3 kg)   Gen: No apparent distress, A&O x 3.  Detailed Urogynecologic Evaluation:  Normal external genitalia. Speculum exam reveals normal mucosa without mesh erosion. No sutures present. No significant prolapse noted with valsalva.   Bladder scan:  A bladder scan was performed to assess for incomplete bladder emptying. After voiding, the probe was placed on the patient's abdomen and PVR was noted to be 47m.     ASSESSMENT AND PLAN    Ms. JAlbaughis a 70y.o. with:  1. Urinary frequency   2. Urinary urgency    We discussed the symptoms of overactive bladder (OAB), which include urinary urgency, urinary frequency, nocturia, with or without urge incontinence.  While we do not know the exact etiology of OAB, several treatment options exist. We discussed management including behavioral therapy (decreasing bladder irritants, urge suppression strategies, timed voids, bladder retraining), physical therapy, medication and other treatments for refractory cases.  - She is consuming several bladder irritants (diet mt dew, alcohol, artifical sweetener). Discussed limiting these and drinking more water. If no improvement, can start a medication.  - We also reviewed that urine stream can be weaker after a sling, but she is emptying well today.   Return as needed if symptoms fail to improve  MJaquita Folds MD  Time spent: I spent 20 minutes dedicated to the care of this patient on the date of this encounter to include pre-visit review of records, face-to-face time with the patient discussing post visit documentation.

## 2020-08-23 NOTE — Patient Instructions (Signed)

## 2020-12-05 ENCOUNTER — Other Ambulatory Visit: Payer: Self-pay

## 2020-12-05 ENCOUNTER — Emergency Department
Admission: EM | Admit: 2020-12-05 | Discharge: 2020-12-05 | Disposition: A | Discharge status: Home / Self Care | Payer: Medicare Other | Attending: Emergency Medicine | Admitting: Emergency Medicine

## 2020-12-05 ENCOUNTER — Emergency Department: Payer: Medicare Other

## 2020-12-05 ENCOUNTER — Encounter: Payer: Self-pay | Admitting: Intensive Care

## 2020-12-05 DIAGNOSIS — E039 Hypothyroidism, unspecified: Secondary | ICD-10-CM | POA: Insufficient documentation

## 2020-12-05 DIAGNOSIS — I10 Essential (primary) hypertension: Secondary | ICD-10-CM | POA: Diagnosis not present

## 2020-12-05 DIAGNOSIS — R059 Cough, unspecified: Secondary | ICD-10-CM | POA: Diagnosis present

## 2020-12-05 DIAGNOSIS — Z79899 Other long term (current) drug therapy: Secondary | ICD-10-CM | POA: Diagnosis not present

## 2020-12-05 DIAGNOSIS — U071 COVID-19: Secondary | ICD-10-CM | POA: Diagnosis not present

## 2020-12-05 DIAGNOSIS — Z96653 Presence of artificial knee joint, bilateral: Secondary | ICD-10-CM | POA: Diagnosis not present

## 2020-12-05 LAB — RESP PANEL BY RT-PCR (FLU A&B, COVID) ARPGX2
Influenza A by PCR: NEGATIVE
Influenza B by PCR: NEGATIVE
SARS Coronavirus 2 by RT PCR: POSITIVE — AB

## 2020-12-05 NOTE — Discharge Instructions (Addendum)
Your COVID test is positive.  Please return to the emergency department if you develop worsening shortness of breath or unable to eat or drink or develop any new symptoms that are concerning to you.  You should quarantine for about 7 days.

## 2020-12-05 NOTE — ED Provider Notes (Signed)
[facility]  ____________________________________________   Event Date/Time   First MD Initiated Contact with Patient 12/05/20 [phone]     (approximate)  I have reviewed the triage vital signs and the nursing notes.   HISTORY  Chief Complaint Cough    HPI Jordan Palmer is a 70 y.o. female with no significant past medical history who presents with congestion and cough.  Symptoms of going on for the past 2 days.  She denies any associated shortness of breath chest pain nausea vomiting diarrhea or decreased appetite.  Her husband has similar symptoms and had a positive home COVID test today so she is concerned about potential COVID.  She is vaccinated and has had her third booster.         Past Medical History:  Diagnosis Date   Arthritis    oa knees   Cystocele with prolapse    Hyperlipemia    Hypertension    Hypothyroidism    Sleep apnea    cpap set on 25   SUI (stress urinary incontinence, female)    Wears contact lenses     Patient Active Problem List   Diagnosis Date Noted   S/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, left 12/23/2019   S/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, right 05/20/2019   H/O total vaginal hysterectomy 03/12/2017   Essential hypertension 03/08/2017   Mixed dyslipidemia 03/08/2017   Abnormal EKG 03/08/2017   Preoperative cardiovascular examination 03/08/2017   Allergic state 12/11/2013   High cholesterol 12/11/2013   History of depression 12/11/2013   Hypothyroidism 12/11/2013   Sleep apnea 12/11/2013    Past Surgical History:  Procedure Laterality Date   ABDOMINAL HYSTERECTOMY     ANTERIOR AND POSTERIOR REPAIR N/A 03/12/2017   Procedure: ANTERIOR (CYSTOCELE) AND POSTERIOR REPAIR (RECTOCELE);  Surgeon: Salvadore Dom, MD;  Location: [facility];  Service: Gynecology;  Laterality: N/A;   BLADDER SUSPENSION N/A 05/03/2020   Procedure: TRANSVAGINAL TAPE (TVT) PROCEDURE;  Surgeon:  Jaquita Folds, MD;  Location: [facility];  Service: Gynecology;  Laterality: N/A;   CARPAL TUNNEL RELEASE Bilateral yrs ago   CHOLECYSTECTOMY  2001   laparoscopic   CYSTOCELE REPAIR N/A 05/03/2020   Procedure: ANTERIOR REPAIR (CYSTOCELE);  Surgeon: Jaquita Folds, MD;  Location: [facility];  Service: Gynecology;  Laterality: N/A;  total time needed for 3 procedures is 1.5hrs   CYSTOSCOPY N/A 03/12/2017   Procedure: CYSTOSCOPY;  Surgeon: Salvadore Dom, MD;  Location: [facility];  Service: Gynecology;  Laterality: N/A;   CYSTOSCOPY N/A 05/03/2020   Procedure: CYSTOSCOPY;  Surgeon: Jaquita Folds, MD;  Location: [facility];  Service: Gynecology;  Laterality: N/A;   KNEE ARTHROSCOPY Left 12/2019   KNEE ARTHROSCOPY WITH MEDIAL MENISECTOMY Right 02/13/2019   Procedure: KNEE ARTHROSCOPY WITH MEDIAL MENISECTOMY;  Surgeon: Thornton Park, MD;  Location: [facility];  Service: Orthopedics;  Laterality: Right;   LACRIMAL DUCT PROBING W/ DACRYOPLASTY Bilateral    SALPINGOOPHORECTOMY Left 03/12/2017   Procedure: SALPINGECTOMY;  Surgeon: Salvadore Dom, MD;  Location: [facility];  Service: Gynecology;  Laterality: Left;   SHOULDER ARTHROSCOPY WITH OPEN ROTATOR CUFF REPAIR Right 09/03/2014   Procedure: SHOULDER ARTHROSCOPY WITH OPEN ROTATOR CUFF REPAIR;  Surgeon: Thornton Park, MD;  Location: [facility];  Service: Orthopedics;  Laterality: Right;   THYROIDECTOMY Right yrs ago   hemi-thyroidectomy   TOTAL KNEE ARTHROPLASTY Right 05/20/2019   Procedure: TOTAL  KNEE ARTHROPLASTY;  Surgeon: Thornton Park, MD;  Location: [facility];  Service: Orthopedics;  Laterality: Right;   TOTAL KNEE ARTHROPLASTY Left 12/23/2019   Procedure: Left TOTAL KNEE ARTHROPLASTY;  Surgeon: Thornton Park, MD;  Location: [facility];  Service: Orthopedics;  Laterality: Left;   VAGINAL HYSTERECTOMY N/A 03/12/2017   Procedure:  HYSTERECTOMY VAGINAL;  Surgeon: Salvadore Dom, MD;  Location: [facility];  Service: Gynecology;  Laterality: N/A;    Prior to Admission medications   Medication Sig Start Date End Date Taking? Authorizing Provider  acetaminophen (TYLENOL) 500 MG tablet Take 1,000 mg by mouth 2 (two) times daily as needed for moderate pain. For after surgery    [provider]  albuterol (VENTOLIN HFA) 108 (90 Base) MCG/ACT inhaler Inhale 2 puffs into the lungs every 4 (four) hours as needed for wheezing or shortness of breath. 06/21/20   Rodriguez-Southworth, Sunday Spillers, PA-C  benzonatate (TESSALON) 200 MG capsule Take 1 capsule (200 mg total) by mouth 2 (two) times daily as needed for cough. 06/21/20   Rodriguez-Southworth, Sunday Spillers, PA-C  celecoxib (CELEBREX) 200 MG capsule Take 200 mg by mouth daily. 11/26/19   [provider]  conjugated estrogens (PREMARIN) vaginal cream 1/2 gram vaginally twice weekly Patient taking differently: Place 1 Applicatorful vaginally 2 (two) times a week. 01/13/19   Salvadore Dom, MD  fexofenadine (ALLEGRA) 180 MG tablet Take 180 mg by mouth daily as needed for allergies.    [provider]  fluticasone (FLONASE) 50 MCG/ACT nasal spray Place 1 spray into both nostrils daily as needed for allergies.    [provider]  levothyroxine (SYNTHROID) 88 MCG tablet Take 88 mcg by mouth daily before breakfast.    [provider]  lisinopril-hydrochlorothiazide (PRINZIDE,ZESTORETIC) 20-25 MG per tablet Take 1 tablet by mouth daily.    [provider]  methylPREDNISolone (MEDROL DOSEPAK) 4 MG TBPK tablet Take as directed 06/21/20   Rodriguez-Southworth, Sunday Spillers, PA-C  naproxen (NAPROSYN) 375 MG tablet Take 1 tablet (375 mg total) by mouth 2 (two) times daily as needed for moderate pain. 06/24/20   Coral Spikes, DO  PRESCRIPTION MEDICATION Cyanocobalamin 1000 mcg vitamin b12 1 shot q week x 4 weeks , then will go to pill     [provider]  rosuvastatin (CRESTOR) 5 MG tablet Take 5 mg by mouth daily at 2 PM. Takes at 200 pm 07/01/15   [provider]  Scar Treatment Products (SCAR GEL EX) Apply 1 application topically daily.    [provider]    Allergies Codeine and Doxycycline  Family History  Problem Relation Age of Onset   Diabetes type II Father    CAD Father    Alzheimer's disease Father    Alzheimer's disease Mother    Breast cancer Neg Hx     Social History Social History   Tobacco Use   Smoking status: Never   Smokeless tobacco: Never  Vaping Use   Vaping Use: Never used  Substance Use Topics   Alcohol use: Yes    Comment: occ    Drug use: No    Review of Systems   Review of Systems  Constitutional:  Negative for appetite change, chills and fever.  HENT:  Positive for congestion and rhinorrhea.   Respiratory:  Positive for cough. Negative for chest tightness and shortness of breath.   Cardiovascular:  Negative for chest pain.  Gastrointestinal:  Negative for abdominal pain and vomiting.  All other systems reviewed and are  negative.  Physical Exam Updated Vital Signs BP 121/74 (BP Location: Right Arm)   Pulse 79   Temp 98.5 F (36.9 C) (Oral)   Resp 20   Ht 5' (1.524 m)   Wt 74.8 kg   SpO2 98%   BMI 32.22 kg/m   Physical Exam Vitals and nursing note reviewed.  Constitutional:      General: She is not in acute distress.    Appearance: Normal appearance.  HENT:     Head: Normocephalic and atraumatic.  Eyes:     General: No scleral icterus.    Conjunctiva/sclera: Conjunctivae normal.  Cardiovascular:     Rate and Rhythm: Normal rate and regular rhythm.  Pulmonary:     Effort: Pulmonary effort is normal. No respiratory distress.     Breath sounds: No stridor. No wheezing.  Musculoskeletal:        General: No deformity or signs of injury.     Cervical back: Normal range of motion.  Skin:    General: Skin is dry.     Coloration: Skin  is not jaundiced or pale.  Neurological:     General: No focal deficit present.     Mental Status: She is alert and oriented to person, place, and time. Mental status is at baseline.  Psychiatric:        Mood and Affect: Mood normal.        Behavior: Behavior normal.     LABS (all labs ordered are listed, but only abnormal results are displayed)  Labs Reviewed  RESP PANEL BY RT-PCR (FLU A&B, COVID) ARPGX2 - Abnormal; Notable for the following components:      Result Value   SARS Coronavirus 2 by RT PCR POSITIVE (*)    All other components within normal limits   ____________________________________________  EKG  N/a ____________________________________________  RADIOLOGY Almeta Monas, personally viewed and evaluated these images (plain radiographs) as part of my medical decision making, as well as reviewing the written report by the radiologist.  ED MD interpretation:  I reviewed the CXR which does not show any acute cardiopulmonary process      ____________________________________________   PROCEDURES  Procedure(s) performed (including Critical Care):  Procedures   ____________________________________________   INITIAL IMPRESSION / ASSESSMENT AND PLAN / ED COURSE     Patient is a 70 year old female presents with cough and congestion times several days.  Her husband has similar symptoms and had a home positive home COVID test.  Vital signs within normal limits she is well-appearing with clear lungs.  She has no shortness of breath or chest pain.  X-ray did not show any pneumonia.  Her lungs are clear.  She is indeed COVID-positive.  We discussed quarantine and return precautions.  She is stable for discharge.  Clinical Course as of 12/05/20 1220  Sun Dec 05, 2020  1155 SARS Coronavirus 2 by RT PCR(!): POSITIVE [KM]    Clinical Course User Index [KM] Rada Hay, MD     ____________________________________________   FINAL CLINICAL  IMPRESSION(S) / ED DIAGNOSES  Final diagnoses:  [id number]     ED Discharge Orders     None        Note:  This document was prepared using Dragon voice recognition software and may include unintentional dictation errors.    Rada Hay, MD 12/05/20 [id number]

## 2020-12-05 NOTE — ED Provider Notes (Signed)
Emergency Medicine Provider Triage Evaluation Note  Jordan Palmer , a 70 y.o. female  was evaluated in triage.  Pt complains of cough and congestion with runny nose.  Husband has been sick this week and had positive home COVID test this morning.  Review of Systems  Positive: Cough and congestion Negative: Denies chest pain, shortness of breath, vomiting diarrhea  Physical Exam  BP 121/74 (BP Location: Right Arm)   Pulse 79   Temp 98.5 F (36.9 C) (Oral)   Resp 20   Ht 5' (1.524 m)   Wt 74.8 kg   SpO2 98%   BMI 32.22 kg/m  Gen:   Awake, no distress   Resp:  Normal effort  MSK:   Moves extremities without difficulty  Other:    Medical Decision Making  Medically screening exam initiated at 8:41 AM.  Appropriate orders placed.  Gailen Shelter was informed that the remainder of the evaluation will be completed by another provider, this initial triage assessment does not replace that evaluation, and the importance of remaining in the ED until their evaluation is complete.     Versie Starks, PA-C 12/05/20 0841    Rada Hay, MD 12/05/20 [phone]

## 2020-12-05 NOTE — ED Triage Notes (Signed)
Patient c/o cough, sore throat, congestion, and headache. Patient reports husband had positive home covid test

## 2021-03-08 ENCOUNTER — Other Ambulatory Visit: Payer: Self-pay

## 2021-03-08 ENCOUNTER — Ambulatory Visit
Admission: EM | Admit: 2021-03-08 | Discharge: 2021-03-08 | Disposition: A | Discharge status: Home / Self Care | Payer: Medicare Other | Source: Home / Self Care | Attending: Emergency Medicine | Admitting: Emergency Medicine

## 2021-03-08 ENCOUNTER — Emergency Department
Admission: EM | Admit: 2021-03-08 | Discharge: 2021-03-08 | Disposition: A | Discharge status: Home / Self Care | Payer: Medicare Other | Attending: Emergency Medicine | Admitting: Emergency Medicine

## 2021-03-08 ENCOUNTER — Emergency Department: Payer: Medicare Other

## 2021-03-08 DIAGNOSIS — E039 Hypothyroidism, unspecified: Secondary | ICD-10-CM | POA: Diagnosis not present

## 2021-03-08 DIAGNOSIS — I10 Essential (primary) hypertension: Secondary | ICD-10-CM | POA: Insufficient documentation

## 2021-03-08 DIAGNOSIS — R7989 Other specified abnormal findings of blood chemistry: Secondary | ICD-10-CM | POA: Insufficient documentation

## 2021-03-08 DIAGNOSIS — M25472 Effusion, left ankle: Secondary | ICD-10-CM | POA: Diagnosis not present

## 2021-03-08 DIAGNOSIS — R2242 Localized swelling, mass and lump, left lower limb: Secondary | ICD-10-CM | POA: Diagnosis present

## 2021-03-08 DIAGNOSIS — R600 Localized edema: Secondary | ICD-10-CM

## 2021-03-08 DIAGNOSIS — M7989 Other specified soft tissue disorders: Secondary | ICD-10-CM

## 2021-03-08 LAB — D-DIMER, QUANTITATIVE: D-Dimer, Quant: 2.19 ug/mL-FEU — ABNORMAL HIGH (ref 0.00–0.50)

## 2021-03-08 NOTE — Discharge Instructions (Addendum)
I am checking a blood test called a D-dimer.  If it is positive, you will need to go to the emergency department to make sure you do not have a blood clot.  If it is negative, we have ruled out a blood clot in your leg.  Try elevation, compression stocking.  Follow-up with your doctor if it does not resolve with these measures.

## 2021-03-08 NOTE — ED Provider Notes (Signed)
HPI  SUBJECTIVE:  Jordan Palmer is a 71 y.o. female who presents with swelling in her left ankle for the past several months.  It is gradually getting bigger.  It is not changed in size over the day.  She reports rough, reddish lesions on her skin as well.  No calf swelling, ankle or calf pain, chest pain, shortness of breath, hemoptysis.  No preceding trauma to the ankle.  No preceding immobilization or surgeries within 4 weeks prior to her ankle swelling.  No aggravating or alleviating factors.  She has not tried anything for this.  She has a past medical history of hypertension, hypercholesterolemia.  No history of coronary artery disease, PVD/PAD, PE, DVT, cancer, hypercoagulability, diabetes, neuropathy.  PMD: [facility].Marland Kitchen  Past Medical History:  Diagnosis Date   Arthritis    oa knees   Cystocele with prolapse    Hyperlipemia    Hypertension    Hypothyroidism    Sleep apnea    cpap set on 25   SUI (stress urinary incontinence, female)    Wears contact lenses     Past Surgical History:  Procedure Laterality Date   ABDOMINAL HYSTERECTOMY     ANTERIOR AND POSTERIOR REPAIR N/A 03/12/2017   Procedure: ANTERIOR (CYSTOCELE) AND POSTERIOR REPAIR (RECTOCELE);  Surgeon: Salvadore Dom, MD;  Location: [facility];  Service: Gynecology;  Laterality: N/A;   BLADDER SUSPENSION N/A 05/03/2020   Procedure: TRANSVAGINAL TAPE (TVT) PROCEDURE;  Surgeon: Jaquita Folds, MD;  Location: [facility];  Service: Gynecology;  Laterality: N/A;   CARPAL TUNNEL RELEASE Bilateral yrs ago   CHOLECYSTECTOMY  2001   laparoscopic   CYSTOCELE REPAIR N/A 05/03/2020   Procedure: ANTERIOR REPAIR (CYSTOCELE);  Surgeon: Jaquita Folds, MD;  Location: [facility];  Service: Gynecology;  Laterality: N/A;  total time needed for 3 procedures is 1.5hrs   CYSTOSCOPY N/A 03/12/2017   Procedure: CYSTOSCOPY;  Surgeon: Salvadore Dom, MD;  [facility];  Service: Gynecology;  Laterality: N/A;   CYSTOSCOPY N/A 05/03/2020   Procedure: CYSTOSCOPY;  Surgeon: Jaquita Folds, MD;  Location[facility];  Service: Gynecology;  Laterality: N/A;   KNEE ARTHROSCOPY Left 12/2019   KNEE ARTHROSCOPY WITH MEDIAL MENISECTOMY Right 02/13/2019   Procedure: KNEE ARTHROSCOPY WITH MEDIAL MENISECTOMY;  Surgeon: Thornton Park, MD;  Location: [facility];  Service: Orthopedics;  Laterality: Right;   LACRIMAL DUCT PROBING W/ DACRYOPLASTY Bilateral    SALPINGOOPHORECTOMY Left 03/12/2017   Procedure: SALPINGECTOMY;  Surgeon: Salvadore Dom, MD;  Location[facility];  Service: Gynecology;  Laterality: Left;   SHOULDER ARTHROSCOPY WITH OPEN ROTATOR CUFF REPAIR Right 09/03/2014   Procedure: SHOULDER ARTHROSCOPY WITH OPEN ROTATOR CUFF REPAIR;  Surgeon: Thornton Park, MD;  Location: [facility];  Service: Orthopedics;  Laterality: Right;   THYROIDECTOMY Right yrs ago   hemi-thyroidectomy   TOTAL KNEE ARTHROPLASTY Right 05/20/2019   Procedure: TOTAL KNEE ARTHROPLASTY;  Surgeon: Thornton Park, MD;  Location: [facility];  Service: Orthopedics;  Laterality: Right;   TOTAL KNEE ARTHROPLASTY Left 12/23/2019   Procedure: Left TOTAL KNEE ARTHROPLASTY;  Surgeon: Thornton Park, MD;  Location: [facility];  Service: Orthopedics;  Laterality: Left;   VAGINAL HYSTERECTOMY N/A 03/12/2017   Procedure: HYSTERECTOMY VAGINAL;  Surgeon: Salvadore Dom, MD;  Location: [facility];  Service: Gynecology;  Laterality: N/A;    Family History  Problem Relation Age of Onset   Diabetes type II  Father    CAD Father    Alzheimer's disease Father    Alzheimer's disease Mother    Breast cancer Neg Hx     Social History   Tobacco Use   Smoking status: Never   Smokeless tobacco: Never  Vaping Use   Vaping Use: Never used  Substance Use Topics   Alcohol use: Yes    Comment: occ    Drug use: No    No current  facility-administered medications for this encounter.  Current Outpatient Medications:    acetaminophen (TYLENOL) 500 MG tablet, Take 1,000 mg by mouth 2 (two) times daily as needed for moderate pain. For after surgery, Disp: , Rfl:    albuterol (VENTOLIN HFA) 108 (90 Base) MCG/ACT inhaler, Inhale 2 puffs into the lungs every 4 (four) hours as needed for wheezing or shortness of breath., Disp: 18 g, Rfl: 0   celecoxib (CELEBREX) 200 MG capsule, Take 200 mg by mouth daily., Disp: , Rfl:    conjugated estrogens (PREMARIN) vaginal cream, 1/2 gram vaginally twice weekly (Patient taking differently: Place 1 Applicatorful vaginally 2 (two) times a week.), Disp: 30 g, Rfl: 1   fexofenadine (ALLEGRA) 180 MG tablet, Take 180 mg by mouth daily as needed for allergies., Disp: , Rfl:    fluticasone (FLONASE) 50 MCG/ACT nasal spray, Place 1 spray into both nostrils daily as needed for allergies., Disp: , Rfl:    levothyroxine (SYNTHROID) 88 MCG tablet, Take 88 mcg by mouth daily before breakfast., Disp: , Rfl:    lisinopril-hydrochlorothiazide (PRINZIDE,ZESTORETIC) 20-25 MG per tablet, Take 1 tablet by mouth daily., Disp: , Rfl:    PRESCRIPTION MEDICATION, Cyanocobalamin 1000 mcg vitamin b12 1 shot q week x 4 weeks , then will go to pill, Disp: , Rfl:    rosuvastatin (CRESTOR) 5 MG tablet, Take 5 mg by mouth daily at 2 PM. Takes at 200 pm, Disp: , Rfl:    Scar Treatment Products (SCAR GEL EX), Apply 1 application topically daily., Disp: , Rfl:   Allergies  Allergen Reactions   Codeine Nausea And Vomiting   Doxycycline Photosensitivity     ROS  As noted in HPI.   Physical Exam  BP (!) 151/84 (BP Location: Left Arm)    Pulse 80    Temp 98.5 F (36.9 C) (Oral)    Resp 18    SpO2 96%   Constitutional: Well developed, well nourished, no acute distress Eyes:  EOMI, conjunctiva normal bilaterally HENT: Normocephalic, atraumatic,mucus membranes moist Respiratory: Normal inspiratory  effort Cardiovascular: Normal rate GI: nondistended skin: No rash, skin intact Musculoskeletal: Calves symmetric, measuring 30.5 cm bilaterally.  Trace edema both calves.  Nontender 1+ edema left ankle.  Left ankle bones, ligaments nontender.  No tenderness along the deep venous system.  No palpable cord.  Several areas of erythematous scaling, no evidence of infection.  Foot warm, pink.  DP 2+.        Neurologic: Alert & oriented x 3, no focal neuro deficits Psychiatric: Speech and behavior appropriate   ED Course   Medications - No data to display  Orders Placed This Encounter  Procedures   D-dimer, quantitative    Standing Status:   Standing    Number of Occurrences:   1   Recheck vitals    Recheck BP    Standing Status:   Standing    Number of Occurrences:   1    Results for orders placed or performed during the hospital encounter of 03/08/21 (from the  past 24 hour(s))  D-dimer, quantitative     Status: Abnormal   Collection Time: 03/08/21  9:11 AM  Result Value Ref Range   D-Dimer, Quant 2.19 (H) 0.00 - 0.50 ug/mL-FEU   No results found.  ED Clinical Impression  1. Edema of left ankle   2. Essential hypertension   3. Positive D dimer      ED Assessment/Plan  1.  Left ankle edema, positive D-dimer.  No tenderness, it has been present for several months, doubt gout, infection.  Wells score -1, patient is low risk.   will send off D-dimer.  If negative, VTE ruled out.  If it is positive, patient to go to the emergency room for an ultrasound.  [phone]  D-dimer 2.19.  It is positive even adjusted for age.  Contacted patient, discussed positive result with her.  Advised her to go to the ED now for further work-up with an ultrasound and anticoagulation if necessary.  Patient says she will go to [facility].  In the meantime, advised compression stocking, elevation.  Follow-up with PMD if it persists.  2.  Hypertension.  Blood pressure noted.  Advised patient to  keep an eye on this and to follow-up with her primary care provider.   No orders of the defined types were placed in this encounter.     *This clinic note was created using Dragon dictation software. Therefore, there may be occasional mistakes despite careful proofreading.  ?    Melynda Ripple, MD 03/08/21 1136

## 2021-03-08 NOTE — ED Triage Notes (Signed)
Pt to ED to rule out DVT to left leg. Swelling to left ankle. Reports elevated d dimer at [facility] today. Denies pain.

## 2021-03-08 NOTE — ED Triage Notes (Signed)
Patient presents to Urgent Care with complaints of left ankle swelling and rash on left ankle x couple months. Pt states she is concerned with blood clot.   Denies pain, SOB, or chest pain.

## 2021-03-08 NOTE — ED Provider Notes (Signed)
[facility] Provider Note    Event Date/Time   First MD Initiated Contact with Patient 03/08/21 1307     (approximate)   History   No chief complaint on file.   HPI  Jordan Palmer is a 71 y.o. female no past medical history of arthritis, HTN, HDL, hypothyroidism, and OSA who presents after being referred from urgent care where she was seen earlier today for further evaluation of some subacute to chronic swelling in the left lower extremity.  She states she is noted swelling is been going on over the last 2 or 3 months.  She denies any falls or injuries.  She denies any associated pain numbness or tingling.  She noticed over the last week there are a few red spots above the ankle but these have not had any bleeding or drainage.  She does not recall any bug bites or pet scratches.  She denies any fevers, headache, earache, sore throat, vomiting, diarrhea, or any other swelling or symptoms in the right leg.     Physical Exam  Triage Vital Signs: ED Triage Vitals  Enc Vitals Group     BP 03/08/21 1248 (!) 135/94     Pulse Rate 03/08/21 1248 82     Resp 03/08/21 1248 20     Temp 03/08/21 1248 98.4 F (36.9 C)     Temp src --      SpO2 03/08/21 1248 98 %     Weight 03/08/21 1249 165 lb (74.8 kg)     Height 03/08/21 1249 5' (1.524 m)     Head Circumference --      Peak Flow --      Pain Score 03/08/21 1249 0     Pain Loc --      Pain Edu? --      Excl. in [place]? --     Most recent vital signs: Vitals:   03/08/21 1248  BP: (!) 135/94  Pulse: 82  Resp: 20  Temp: 98.4 F (36.9 C)  SpO2: 98%    General: Awake, no distress.  CV:  Good peripheral perfusion.  Resp:  Normal effort.  Abd:  No distention.  Other:  Some increased edema in the left leg compared to the right.  Patient has full strength and range of motion.  2+ DP pulses.  Sensation intact light touch throughout.  There is no effusion deformity or other joint abnormality noted.  There is a  little scaly erythematous lesion over the mid lower leg without any induration, fluctuance, bleeding, purulent drainage, warmth or tenderness.   ED Results / Procedures / Treatments  Labs (all labs ordered are listed, but only abnormal results are displayed) Labs Reviewed - No data to display   EKG     RADIOLOGY  Left lower extremity ultrasound ordered interpreted by myself shows no evidence of DVT.  I also reviewed radiology's interpretation and agree with the findings.   PROCEDURES:  Critical Care performed: No  Procedures    MEDICATIONS ORDERED IN ED: Medications - No data to display   IMPRESSION / MDM / [place] / ED COURSE  I reviewed the triage vital signs and the nursing notes.                              Differential diagnosis includes, but is not limited to DVT, cellulitis, dermatitis or psoriasis, fungal infection with low suspicion for trauma, compartment  syndrome, or venous stasis.  Exam is not suggestive of bacterial cellulitis or necrotizing infection.  Ultrasound ordered shows no evidence of DVT.  Patient is a possibly psoriatic in nature also not explain the swelling.  Given overall duration I think she is stable for discharge with continued outpatient evaluation.  Low suspicion for other immediate life-threatening process.  Discharged in stable condition.  Strict return precautions advised and discussed.     FINAL CLINICAL IMPRESSION(S) / ED DIAGNOSES   Final diagnoses:  Left leg swelling     Rx / DC Orders   ED Discharge Orders     None        Note:  This document was prepared using Dragon voice recognition software and may include unintentional dictation errors.   Lucrezia Starch, MD 03/08/21 [phone]

## 2021-03-08 NOTE — ED Triage Notes (Signed)
First RN note:  Pt comes into the ED via POV from UC c/o elevated d-dimer.  Pt has had a swollen ankle with sores present around the area, so they checked her d-dimer today and found it to be elevated.  Pt sent here to r/o DVT.

## 2021-04-25 ENCOUNTER — Other Ambulatory Visit: Payer: Self-pay | Admitting: Internal Medicine

## 2021-04-25 DIAGNOSIS — Z1231 Encounter for screening mammogram for malignant neoplasm of breast: Secondary | ICD-10-CM

## 2021-06-07 ENCOUNTER — Ambulatory Visit
Admission: RE | Admit: 2021-06-07 | Discharge: 2021-06-07 | Disposition: A | Discharge status: Home / Self Care | Payer: Medicare Other | Source: Ambulatory Visit | Attending: Internal Medicine | Admitting: Internal Medicine

## 2021-06-07 DIAGNOSIS — Z1231 Encounter for screening mammogram for malignant neoplasm of breast: Secondary | ICD-10-CM | POA: Insufficient documentation

## 2021-07-20 ENCOUNTER — Other Ambulatory Visit: Payer: Self-pay | Admitting: Physician Assistant

## 2021-07-20 DIAGNOSIS — R413 Other amnesia: Secondary | ICD-10-CM

## 2021-08-03 ENCOUNTER — Ambulatory Visit
Admission: EM | Admit: 2021-08-03 | Discharge: 2021-08-03 | Disposition: A | Discharge status: Home / Self Care | Payer: Medicare Other | Attending: Emergency Medicine | Admitting: Emergency Medicine

## 2021-08-03 ENCOUNTER — Other Ambulatory Visit: Payer: Self-pay

## 2021-08-03 ENCOUNTER — Encounter: Payer: Self-pay | Admitting: Emergency Medicine

## 2021-08-03 ENCOUNTER — Ambulatory Visit
Admission: RE | Admit: 2021-08-03 | Discharge: 2021-08-03 | Disposition: A | Discharge status: Home / Self Care | Payer: Medicare Other | Source: Ambulatory Visit | Attending: Physician Assistant | Admitting: Physician Assistant

## 2021-08-03 DIAGNOSIS — R238 Other skin changes: Secondary | ICD-10-CM

## 2021-08-03 DIAGNOSIS — L089 Local infection of the skin and subcutaneous tissue, unspecified: Secondary | ICD-10-CM | POA: Diagnosis not present

## 2021-08-03 DIAGNOSIS — R413 Other amnesia: Secondary | ICD-10-CM | POA: Insufficient documentation

## 2021-08-03 MED ORDER — CEPHALEXIN 500 MG PO CAPS: 500.0000 mg | ORAL_CAPSULE | Freq: Three times a day (TID) | ORAL | 0 refills | Status: AC

## 2021-08-03 NOTE — ED Triage Notes (Signed)
Pt present to [place] for blisters in her left great and 2nd toes, and her right great toe. Started 11 days ago. She states she worse new pair of shoes and after wearing them all day she noticed the blisters. Pt is concerned the wounds may be infected.

## 2021-08-03 NOTE — Discharge Instructions (Signed)
Take the Keflex 3 times daily with food for 7 days.  Keep the blisters clean and dry.  Leave the blisters open to air.  If you develop any fever, increased swelling, pus drainage, or red streaks going up your foot return for re-evaluation.

## 2021-08-03 NOTE — ED Provider Notes (Signed)
[facility]    CSN: [id number] Arrival date & time: 08/03/21  1137      History   Chief Complaint Chief Complaint  Patient presents with   Foot Pain    Bilateral     HPI Jordan Palmer is a 71 y.o. female.   HPI  71 year old female here for evaluation of skin complaint.  Patient reports that 11 days ago she wore a new pair of shoes to a wedding and after wearing them all day she noticed that she developed blisters on both of her big toes, her left second toe, and on the outside of her right foot.  She states that since then she has developed swelling of her toes, pain with weightbearing, and some clear drainage.  She denies any fevers.  Past Medical History:  Diagnosis Date   Arthritis    oa knees   Cystocele with prolapse    Hyperlipemia    Hypertension    Hypothyroidism    Sleep apnea    cpap set on 25   SUI (stress urinary incontinence, female)    Wears contact lenses     Patient Active Problem List   Diagnosis Date Noted   S/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, left 12/23/2019   S/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, right 05/20/2019   H/O total vaginal hysterectomy 03/12/2017   Essential hypertension 03/08/2017   Mixed dyslipidemia 03/08/2017   Abnormal EKG 03/08/2017   Preoperative cardiovascular examination 03/08/2017   Allergic state 12/11/2013   High cholesterol 12/11/2013   History of depression 12/11/2013   Hypothyroidism 12/11/2013   Sleep apnea 12/11/2013    Past Surgical History:  Procedure Laterality Date   ABDOMINAL HYSTERECTOMY     ANTERIOR AND POSTERIOR REPAIR N/A 03/12/2017   Procedure: ANTERIOR (CYSTOCELE) AND POSTERIOR REPAIR (RECTOCELE);  Surgeon: Salvadore Dom, MD;  Location: [facility];  Service: Gynecology;  Laterality: N/A;   BLADDER SUSPENSION N/A 05/03/2020   Procedure: TRANSVAGINAL TAPE (TVT) PROCEDURE;  Surgeon: Jaquita Folds, MD;  Location: [facility];   Service: Gynecology;  Laterality: N/A;   CARPAL TUNNEL RELEASE Bilateral yrs ago   CHOLECYSTECTOMY  2001   laparoscopic   CYSTOCELE REPAIR N/A 05/03/2020   Procedure: ANTERIOR REPAIR (CYSTOCELE);  Surgeon: Jaquita Folds, MD;  Location[facility];  Service: Gynecology;  Laterality: N/A;  total time needed for 3 procedures is 1.5hrs   CYSTOSCOPY N/A 03/12/2017   Procedure: CYSTOSCOPY;  Surgeon: Salvadore Dom, MD;  Location: [facility];  Service: Gynecology;  Laterality: N/A;   CYSTOSCOPY N/A 05/03/2020   Procedure: CYSTOSCOPY;  Surgeon: Jaquita Folds, MD;  Location: [facility];  Service: Gynecology;  Laterality: N/A;   KNEE ARTHROSCOPY Left 12/2019   KNEE ARTHROSCOPY WITH MEDIAL MENISECTOMY Right 02/13/2019   Procedure: KNEE ARTHROSCOPY WITH MEDIAL MENISECTOMY;  Surgeon: Thornton Park, MD;  Location: [facility];  Service: Orthopedics;  Laterality: Right;   LACRIMAL DUCT PROBING W/ DACRYOPLASTY Bilateral    SALPINGOOPHORECTOMY Left 03/12/2017   Procedure: SALPINGECTOMY;  Surgeon: Salvadore Dom, MD;  Location: [facility];  Service: Gynecology;  Laterality: Left;   SHOULDER ARTHROSCOPY WITH OPEN ROTATOR CUFF REPAIR Right 09/03/2014   Procedure: SHOULDER ARTHROSCOPY WITH OPEN ROTATOR CUFF REPAIR;  Surgeon: Thornton Park, MD;  Location: [facility];  Service: Orthopedics;  Laterality: Right;   THYROIDECTOMY Right yrs ago   hemi-thyroidectomy   TOTAL KNEE ARTHROPLASTY Right 05/20/2019  Procedure: TOTAL KNEE ARTHROPLASTY;  Surgeon: Thornton Park, MD;  Location: [facility];  Service: Orthopedics;  Laterality: Right;   TOTAL KNEE ARTHROPLASTY Left 12/23/2019   Procedure: Left TOTAL KNEE ARTHROPLASTY;  Surgeon: Thornton Park, MD;  Location: [facility];  Service: Orthopedics;  Laterality: Left;   VAGINAL HYSTERECTOMY N/A 03/12/2017   Procedure: HYSTERECTOMY VAGINAL;  Surgeon: Salvadore Dom, MD;  Location: [facility];  Service: Gynecology;  Laterality: N/A;    OB History     Gravida  1   Para  1   Term  1   Preterm      AB      Living  1      SAB      IAB      Ectopic      Multiple      Live Births  1            Home Medications    Prior to Admission medications   Medication Sig Start Date End Date Taking? Authorizing Provider  celecoxib (CELEBREX) 200 MG capsule Take 200 mg by mouth daily. 11/26/19  Yes [provider]  cephALEXin (KEFLEX) 500 MG capsule Take 1 capsule (500 mg total) by mouth 3 (three) times daily for 7 days. 08/03/21 08/10/21 Yes Margarette Canada, NP  fexofenadine (ALLEGRA) 180 MG tablet Take 180 mg by mouth daily as needed for allergies.   Yes [provider]  fluticasone (FLONASE) 50 MCG/ACT nasal spray Place 1 spray into both nostrils daily as needed for allergies.   Yes [provider]  levothyroxine (SYNTHROID) 88 MCG tablet Take 88 mcg by mouth daily before breakfast.   Yes [provider]  lisinopril-hydrochlorothiazide (PRINZIDE,ZESTORETIC) 20-25 MG per tablet Take 1 tablet by mouth daily.   Yes [provider]  PRESCRIPTION MEDICATION Cyanocobalamin 1000 mcg vitamin b12 1 shot q week x 4 weeks , then will go to pill   Yes [provider]  rosuvastatin (CRESTOR) 5 MG tablet Take 5 mg by mouth daily at 2 PM. Takes at 200 pm 07/01/15  Yes [provider]  conjugated estrogens (PREMARIN) vaginal cream 1/2 gram vaginally twice weekly Patient taking differently: Place 1 Applicatorful vaginally 2 (two) times a week. 01/13/19   Salvadore Dom, MD  Scar Treatment Products (SCAR GEL EX) Apply 1 application topically daily.    [provider]    Family History Family History  Problem Relation Age of Onset   Diabetes type II Father    CAD Father    Alzheimer's disease Father    Alzheimer's disease Mother    Breast cancer Neg Hx     Social History Social History    Tobacco Use   Smoking status: Never   Smokeless tobacco: Never  Vaping Use   Vaping Use: Never used  Substance Use Topics   Alcohol use: Yes    Comment: occ    Drug use: No     Allergies   Codeine and Doxycycline   Review of Systems Review of Systems  Constitutional:  Negative for fever.  Musculoskeletal:  Negative for arthralgias and joint swelling.  Skin:  Positive for color change and wound.  Hematological: Negative.   Psychiatric/Behavioral: Negative.       Physical Exam Triage Vital Signs ED Triage Vitals  Enc Vitals Group     BP 08/03/21 1153 126/81     Pulse Rate 08/03/21 1153 72     Resp 08/03/21 1153 16  Temp 08/03/21 1153 98.3 F (36.8 C)     Temp Source 08/03/21 1153 Oral     SpO2 08/03/21 1153 100 %     Weight 08/03/21 1151 164 lb 14.5 oz (74.8 kg)     Height 08/03/21 1151 5' (1.524 m)     Head Circumference --      Peak Flow --      Pain Score 08/03/21 1149 7     Pain Loc --      Pain Edu? --      Excl. in [place]? --    No data found.  Updated Vital Signs BP 126/81 (BP Location: Right Arm)   Pulse 72   Temp 98.3 F (36.8 C) (Oral)   Resp 16   Ht 5' (1.524 m)   Wt 164 lb 14.5 oz (74.8 kg)   SpO2 100%   BMI 32.21 kg/m   Visual Acuity Right Eye Distance:   Left Eye Distance:   Bilateral Distance:    Right Eye Near:   Left Eye Near:    Bilateral Near:     Physical Exam Vitals and nursing note reviewed.  Constitutional:      Appearance: Normal appearance. She is not ill-appearing.  HENT:     Head: Normocephalic and atraumatic.  Skin:    General: Skin is warm and dry.     Capillary Refill: Capillary refill takes less than 2 seconds.     Findings: Erythema and lesion present.  Neurological:     General: No focal deficit present.     Mental Status: She is alert and oriented to person, place, and time.  Psychiatric:        Mood and Affect: Mood normal.        Behavior: Behavior normal.        Thought Content: Thought content  normal.        Judgment: Judgment normal.      UC Treatments / Results  Labs (all labs ordered are listed, but only abnormal results are displayed) Labs Reviewed - No data to display  EKG   Radiology No results found.  Procedures Procedures (including critical care time)  Medications Ordered in UC Medications - No data to display  Initial Impression / Assessment and Plan / UC Course  I have reviewed the triage vital signs and the nursing notes.  Pertinent labs & imaging results that were available during my care of the patient were reviewed by me and considered in my medical decision making (see chart for details).  Patient is a very pleasant, nontoxic-appearing 71 year old female here for evaluation of 11 days worth of blisters on both feet that started after wearing a new pair of shoes to a wedding.  She states she was not wearing hosiery but was in stocking feet.  The shoe rubbed across her toes.  The following day she noticed swelling to the tops of her toes and she has been wearing flip-flops ever since then.  She is concerned that the blisters may be infected as they have become ecchymotic, remain tender, and have not healed after 11 days.  She is not diabetic.  She denies any fevers.  On exam patient does have 2 ruptured blisters on both of her great toes that have an ecchymotic base.  Cap refill in both toes is less than 2 seconds.  There is no heat but there is some underlying and surrounding erythema.  The left second toe is very similar and there is a flat,  circular patch on the distal lateral right midfoot.  The right midfoot is blanchable the remaining lesions are nonblanchable.  There is some serous drainage coming from the right great toe.  DP PT pulses bilaterally are 2+.  I am concerned that patient has developed a secondary bacterial infection due to the blisters.  I will place her on Keflex 3 times daily x7 days for treatment of the infection.  I advised her to keep her  feet open to air is much as possible, keep them elevated is much as possible and avoid weightbearing, and to keep them clean and dry.  If she develops any increased redness, pus drainage, fever, or red streaks ascending her feet she is to return for reevaluation.   Final Clinical Impressions(s) / UC Diagnoses   Final diagnoses:  Blisters of multiple sites, infected     Discharge Instructions      Take the Keflex 3 times daily with food for 7 days.  Keep the blisters clean and dry.  Leave the blisters open to air.  If you develop any fever, increased swelling, pus drainage, or red streaks going up your foot return for re-evaluation.     ED Prescriptions     Medication Sig Dispense Auth. Provider   cephALEXin (KEFLEX) 500 MG capsule Take 1 capsule (500 mg total) by mouth 3 (three) times daily for 7 days. 20 capsule Margarette Canada, NP      PDMP not reviewed this encounter.   Margarette Canada, NP 08/03/21 1240

## 2021-08-08 ENCOUNTER — Ambulatory Visit: Payer: Medicare Other | Attending: Physician Assistant | Admitting: Speech Pathology

## 2021-08-08 DIAGNOSIS — R413 Other amnesia: Secondary | ICD-10-CM | POA: Diagnosis present

## 2021-08-08 DIAGNOSIS — R41841 Cognitive communication deficit: Secondary | ICD-10-CM | POA: Diagnosis not present

## 2021-08-08 NOTE — Therapy (Signed)
OUTPATIENT SPEECH LANGUAGE PATHOLOGY EVALUATION   Patient Name: Jordan Palmer MRN: [id number] DOB:1950/04/23, 71 y.o., female Today's Date: 08/10/2021  PCP: Marcelino Duster, MD REFERRING PROVIDER: Nilda Calamity, PA    End of Session - 08/08/21 1459     Visit Number 1    Number of Visits 17    Authorization Type [employer]    Progress Note Due on Visit 10    Activity Tolerance Patient tolerated treatment well             Past Medical History:  Diagnosis Date   Arthritis    oa knees   Cystocele with prolapse    Hyperlipemia    Hypertension    Hypothyroidism    Sleep apnea    cpap set on 25   SUI (stress urinary incontinence, female)    Wears contact lenses    Past Surgical History:  Procedure Laterality Date   ABDOMINAL HYSTERECTOMY     ANTERIOR AND POSTERIOR REPAIR N/A 03/12/2017   Procedure: ANTERIOR (CYSTOCELE) AND POSTERIOR REPAIR (RECTOCELE);  Surgeon: Romualdo Bolk, MD;  Location: [facility];  Service: Gynecology;  Laterality: N/A;   BLADDER SUSPENSION N/A 05/03/2020   Procedure: TRANSVAGINAL TAPE (TVT) PROCEDURE;  Surgeon: Marguerita Beards, MD;  Location: [facility];  Service: Gynecology;  Laterality: N/A;   CARPAL TUNNEL RELEASE Bilateral yrs ago   CHOLECYSTECTOMY  2001   laparoscopic   CYSTOCELE REPAIR N/A 05/03/2020   Procedure: ANTERIOR REPAIR (CYSTOCELE);  Surgeon: Marguerita Beards, MD;  Location: [facility];  Service: Gynecology;  Laterality: N/A;  total time needed for 3 procedures is 1.5hrs   CYSTOSCOPY N/A 03/12/2017   Procedure: CYSTOSCOPY;  Surgeon: Romualdo Bolk, MD;  Location: [facility];  Service: Gynecology;  Laterality: N/A;   CYSTOSCOPY N/A 05/03/2020   Procedure: CYSTOSCOPY;  Surgeon: Marguerita Beards, MD;  Location: [facility];  Service: Gynecology;  Laterality: N/A;   KNEE ARTHROSCOPY Left 12/2019   KNEE ARTHROSCOPY WITH  MEDIAL MENISECTOMY Right 02/13/2019   Procedure: KNEE ARTHROSCOPY WITH MEDIAL MENISECTOMY;  Surgeon: Juanell Fairly, MD;  Location: [facility];  Service: Orthopedics;  Laterality: Right;   LACRIMAL DUCT PROBING W/ DACRYOPLASTY Bilateral    SALPINGOOPHORECTOMY Left 03/12/2017   Procedure: SALPINGECTOMY;  Surgeon: Romualdo Bolk, MD;  Location: [facility];  Service: Gynecology;  Laterality: Left;   SHOULDER ARTHROSCOPY WITH OPEN ROTATOR CUFF REPAIR Right 09/03/2014   Procedure: SHOULDER ARTHROSCOPY WITH OPEN ROTATOR CUFF REPAIR;  Surgeon: Juanell Fairly, MD;  Location: [facility];  Service: Orthopedics;  Laterality: Right;   THYROIDECTOMY Right yrs ago   hemi-thyroidectomy   TOTAL KNEE ARTHROPLASTY Right 05/20/2019   Procedure: TOTAL KNEE ARTHROPLASTY;  Surgeon: Juanell Fairly, MD;  Location: [facility];  Service: Orthopedics;  Laterality: Right;   TOTAL KNEE ARTHROPLASTY Left 12/23/2019   Procedure: Left TOTAL KNEE ARTHROPLASTY;  Surgeon: Juanell Fairly, MD;  Location: [facility];  Service: Orthopedics;  Laterality: Left;   VAGINAL HYSTERECTOMY N/A 03/12/2017   Procedure: HYSTERECTOMY VAGINAL;  Surgeon: Romualdo Bolk, MD;  Location: [facility];  Service: Gynecology;  Laterality: N/A;   Patient Active Problem List   Diagnosis Date Noted   S/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, left 12/23/2019   S/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, right 05/20/2019   H/O total vaginal hysterectomy 03/12/2017   Essential hypertension 03/08/2017   Mixed dyslipidemia 03/08/2017   Abnormal EKG 03/08/2017  Preoperative cardiovascular examination 03/08/2017   Allergic state 12/11/2013   High cholesterol 12/11/2013   History of depression 12/11/2013   Hypothyroidism 12/11/2013   Sleep apnea 12/11/2013    ONSET DATE: 07/21/2021 referral date   REFERRING DIAG: R41.3 (ICD-10-CM) - Other amnesia  THERAPY DIAG: R41.841 (ICD-10-CM) - Cognitive Communication  Deficit    Rationale for Evaluation and Treatment Rehabilitation  SUBJECTIVE:   SUBJECTIVE STATEMENT: "I am worried about how this will go" Pt accompanied by: self  PERTINENT HISTORY: N/A  PAIN:  Are you having pain? No   FALLS: Has patient fallen in last 6 months?  No  LIVING ENVIRONMENT: Lives with: lives with their spouse Lives in: House/apartment  PLOF:  Level of assistance: Independent with ADLs, Independent with IADLs Employment: Retired   PATIENT GOALS to improve her memory  OBJECTIVE:   DIAGNOSTIC FINDINGS: MRI on 08/03/2021 No evidence of acute intracranial abnormality. 2. Disproportionately prominent atrophy of the anterior left temporal lobe. 3. Mild cerebral atrophy elsewhere. 4. Mild chronic small vessel image changes within the cerebral white matter.   COGNITION: Overall cognitive status: Within functional limits for tasks assessed  COGNITIVE COMMUNICATION Following directions: Follows multi-step commands consistently  Auditory comprehension: WFL Verbal expression: WFL Functional communication: WFL  ORAL MOTOR EXAMINATION  WNL    STANDARDIZED ASSESSMENTS:  Cognitive Linguistic Quick Test  AGE - 70-89   The Cognitive Linguistic Quick Test (CLQT) was administered to assess the relative status of five cognitive domains: attention, memory, language, executive functioning, and visuospatial skills. Scores from 10 tasks were used to estimate severity ratings (standardized for age groups 18-69 years and 70-89 years) for each domain, a clock drawing task, as well as an overall composite severity rating of cognition.       Task Score Criterion Cut Scores  Personal Facts 8/8 8  Symbol Cancellation 12/12 10  Confrontation Naming 10/10 10  Clock Drawing  12/13 11  Story Retelling 8/10 5  Symbol Trails 10/10 6  Generative Naming 5/9 4  Design Memory 6/6 4  Mazes  8/8 4  Design Generation 6/13 5    Cognitive Domain Composite Score Severity  Rating  Attention 204/215 WNL  Memory 169/185 WNL  Executive Function 29/40 WNL  Language 31/37 WNL  Visuospatial Skills 98/105 WNL  Clock Drawing  12/13 WNL  Composite Severity Rating  WNL    and   The "[employer] Mental Status" (SLUMS) Examination was administered.   SLUMS Examination Orientation  3/3  Numeric Problem Solving  3/3  Memory  5/5  Attention 1/2  Thought Organization 3/3  Clock Drawing 4/4  Visuospatial Skills               2/2  Short Story Recall  6/8  Total  27/30     Scoring  High School Education  Less than High School Education   Normal  27-30 25-30  Mild Neurocognitive Disorder 21-26 20-24  Dementia  1-20 1-19       PATIENT EDUCATION: Education details: results of this assessment, cognition within normal range Person educated: Patient Education method: Explanation, Demonstration, and Verbal cues Education comprehension: verbalized understanding, returned demonstration, and needs further education     GOALS: Goals reviewed with patient? Yes  SHORT TERM GOALS: Target date: 10 sessions  Pt and her husband will demonstrate understanding of cognitive communication function by answering questions with > 95% accuracy.  Baseline: new goal Goal status: INITIAL  LONG TERM GOALS: Target date: 10/05/2021    Pt  will demonstrate cognitive communication abilities for independent completion of ADLs and IADLs. Baseline: new goal Goal status: INITIAL   ASSESSMENT:  CLINICAL IMPRESSION: Patient is a 71 y.o. female who was seen today for formal cognitive communication evaluation d/t concern for memory loss. At this time, presents with cognitive communication abilities that fall within the normal range. Pt requests a couple of follow up sessions to help provide caregiver education to pt's husband.     REHAB POTENTIAL: Excellent  PLAN: SLP FREQUENCY: 1-2x/week  SLP DURATION: 8 weeks  PLANNED INTERVENTIONS: Functional tasks and  Patient/family education   Luberta Grabinski B. Dreama Saa, M.S., CCC-SLP, Tree surgeon Certified Brain Injury Specialist [facility]  [facility] Rehabilitation Services Office [phone] Ascom [phone] Fax [phone]

## 2021-08-12 ENCOUNTER — Ambulatory Visit: Payer: Medicare Other | Admitting: Speech Pathology

## 2021-08-12 DIAGNOSIS — R41841 Cognitive communication deficit: Secondary | ICD-10-CM | POA: Diagnosis not present

## 2021-08-12 DIAGNOSIS — R413 Other amnesia: Secondary | ICD-10-CM

## 2021-08-12 NOTE — Therapy (Signed)
OUTPATIENT SPEECH LANGUAGE PATHOLOGY EVALUATION   Patient Name: Jordan Palmer MRN: [id number] DOB:[birth year], 71 y.o., female Today's Date: 08/12/2021  PCP: Harrel Lemon, MD REFERRING PROVIDER: Luella Cook, PA   End of Session - 08/12/21 1527     Visit Number 2    Number of Visits [place] [employer]    SLP Start Time 0900    SLP Stop Time  1000    SLP Time Calculation (min) 60 min    Activity Tolerance Patient tolerated treatment well             End of Session - 08/08/21 1459     Visit Number 1    Number of Visits 17    Authorization Type [employer]    Progress Note Due on Visit 10    Activity Tolerance Patient tolerated treatment well             Past Medical History:  Diagnosis Date   Arthritis    oa knees   Cystocele with prolapse    Hyperlipemia    Hypertension    Hypothyroidism    Sleep apnea    cpap set on 25   SUI (stress urinary incontinence, female)    Wears contact lenses    Past Surgical History:  Procedure Laterality Date   ABDOMINAL HYSTERECTOMY     ANTERIOR AND POSTERIOR REPAIR N/A 03/12/2017   Procedure: ANTERIOR (CYSTOCELE) AND POSTERIOR REPAIR (RECTOCELE);  Surgeon: Salvadore Dom, MD;  Location: [facility];  Service: Gynecology;  Laterality: N/A;   BLADDER SUSPENSION N/A 05/03/2020   Procedure: TRANSVAGINAL TAPE (TVT) PROCEDURE;  Surgeon: Jaquita Folds, MD;  Location: [facility];  Service: Gynecology;  Laterality: N/A;   CARPAL TUNNEL RELEASE Bilateral yrs ago   CHOLECYSTECTOMY  2001   laparoscopic   CYSTOCELE REPAIR N/A 05/03/2020   Procedure: ANTERIOR REPAIR (CYSTOCELE);  Surgeon: Jaquita Folds, MD;  Location: [facility];  Service: Gynecology;  Laterality: N/A;  total time needed for 3 procedures is 1.5hrs   CYSTOSCOPY N/A 03/12/2017   Procedure: CYSTOSCOPY;  Surgeon: Salvadore Dom, MD;  Location: [facility];  Service: Gynecology;  Laterality: N/A;   CYSTOSCOPY N/A 05/03/2020   Procedure: CYSTOSCOPY;  Surgeon: Jaquita Folds, MD;  Location: [facility];  Service: Gynecology;  Laterality: N/A;   KNEE ARTHROSCOPY Left 12/2019   KNEE ARTHROSCOPY WITH MEDIAL MENISECTOMY Right 02/13/2019   Procedure: KNEE ARTHROSCOPY WITH MEDIAL MENISECTOMY;  Surgeon: Thornton Park, MD;  Location: [facility];  Service: Orthopedics;  Laterality: Right;   LACRIMAL DUCT PROBING W/ DACRYOPLASTY Bilateral    SALPINGOOPHORECTOMY Left 03/12/2017   Procedure: SALPINGECTOMY;  Surgeon: Salvadore Dom, MD;  Location: [facility];  Service: Gynecology;  Laterality: Left;   SHOULDER ARTHROSCOPY WITH OPEN ROTATOR CUFF REPAIR Right 09/03/2014   Procedure: SHOULDER ARTHROSCOPY WITH OPEN ROTATOR CUFF REPAIR;  Surgeon: Thornton Park, MD;  Location: [facility];  Service: Orthopedics;  Laterality: Right;   THYROIDECTOMY Right yrs ago   hemi-thyroidectomy   TOTAL KNEE ARTHROPLASTY Right 05/20/2019   Procedure: TOTAL KNEE ARTHROPLASTY;  Surgeon: Thornton Park, MD;  Location: [facility];  Service: Orthopedics;  Laterality: Right;   TOTAL KNEE ARTHROPLASTY Left 12/23/2019   Procedure: Left TOTAL KNEE ARTHROPLASTY;  Surgeon: Thornton Park, MD;  Location: [facility];  Service: Orthopedics;  Laterality: Left;   VAGINAL HYSTERECTOMY N/A 03/12/2017   Procedure: HYSTERECTOMY VAGINAL;  Surgeon: Salvadore Dom, MD;  Location: [facility];  Service: Gynecology;  Laterality: N/A;   Patient Active Problem List   Diagnosis Date Noted   S/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, left 12/23/2019   S/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, right 05/20/2019   H/O total vaginal hysterectomy 03/12/2017   Essential hypertension 03/08/2017   Mixed dyslipidemia 03/08/2017   Abnormal EKG 03/08/2017   Preoperative cardiovascular examination 03/08/2017   Allergic state  12/11/2013   High cholesterol 12/11/2013   History of depression 12/11/2013   Hypothyroidism 12/11/2013   Sleep apnea 12/11/2013    ONSET DATE: 07/21/2021 referral date   REFERRING DIAG: R41.3 (ICD-10-CM) - Other amnesia  THERAPY DIAG: R41.841 (ICD-10-CM) - Cognitive Communication Deficit    Rationale for Evaluation and Treatment Rehabilitation  SUBJECTIVE:   SUBJECTIVE STATEMENT: "I am worried about how this will go" Pt accompanied by: self  PERTINENT HISTORY: N/A  PAIN:  Are you having pain? No   FALLS: Has patient fallen in last 6 months?  No  LIVING ENVIRONMENT: Lives with: lives with their spouse Lives in: House/apartment  PLOF:  Level of assistance: Independent with ADLs, Independent with IADLs Employment: Retired   PATIENT GOALS to improve her memory  OBJECTIVE:   DIAGNOSTIC FINDINGS: MRI on 08/03/2021 No evidence of acute intracranial abnormality. 2. Disproportionately prominent atrophy of the anterior left temporal lobe. 3. Mild cerebral atrophy elsewhere. 4. Mild chronic small vessel image changes within the cerebral white matter.   COGNITION: Overall cognitive status: Within functional limits for tasks assessed  COGNITIVE COMMUNICATION Following directions: Follows multi-step commands consistently  Auditory comprehension: WFL Verbal expression: WFL Functional communication: WFL  ORAL MOTOR EXAMINATION  WNL    STANDARDIZED ASSESSMENTS:  Cognitive Linguistic Quick Test  AGE - 70-89   The Cognitive Linguistic Quick Test (CLQT) was administered to assess the relative status of five cognitive domains: attention, memory, language, executive functioning, and visuospatial skills. Scores from 10 tasks were used to estimate severity ratings (standardized for age groups 18-69 years and 70-89 years) for each domain, a clock drawing task, as well as an overall composite severity rating of cognition.       Task Score Criterion Cut Scores   Personal Facts 8/8 8  Symbol Cancellation 12/12 10  Confrontation Naming 10/10 10  Clock Drawing  12/13 11  Story Retelling 8/10 5  Symbol Trails 10/10 6  Generative Naming 5/9 4  Design Memory 6/6 4  Mazes  8/8 4  Design Generation 6/13 5    Cognitive Domain Composite Score Severity Rating  Attention 204/215 WNL  Memory 169/185 WNL  Executive Function 29/40 WNL  Language 31/37 WNL  Visuospatial Skills 98/105 WNL  Clock Drawing  12/13 WNL  Composite Severity Rating  WNL    and   The "[place] Mental Status" (SLUMS) Examination was administered.   SLUMS Examination Orientation  3/3  Numeric Problem Solving  3/3  Memory  5/5  Attention 1/2  Thought Organization 3/3  Clock Drawing 4/4  Visuospatial Skills               2/2  Short Story Recall  6/8  Total  27/30     Scoring  High School Education  Less than High School Education   Normal  27-30 25-30  Mild Neurocognitive Disorder 21-26 20-24  Dementia  1-20 1-19       PATIENT EDUCATION: Education details: results of this assessment, cognition within normal range Person  educated: Patient Education method: Explanation, Demonstration, and Verbal cues Education comprehension: verbalized understanding, returned demonstration, and needs further education     GOALS: Goals reviewed with patient? Yes  SHORT TERM GOALS: Target date: 10 sessions  Pt and her husband will demonstrate understanding of cognitive communication function by answering questions with > 95% accuracy.  Baseline: new goal Goal status: INITIAL  LONG TERM GOALS: Target date: 10/05/2021    Pt will demonstrate cognitive communication abilities for independent completion of ADLs and IADLs. Baseline: new goal Goal status: INITIAL   ASSESSMENT:  CLINICAL IMPRESSION: Patient is a 71 y.o. female who was seen today for formal cognitive communication evaluation d/t concern for memory loss. At this time, presents with cognitive  communication abilities that fall within the normal range. Pt requests a couple of follow up sessions to help provide caregiver education to pt's husband.     REHAB POTENTIAL: Excellent  PLAN: SLP FREQUENCY: 1-2x/week  SLP DURATION: 8 weeks  PLANNED INTERVENTIONS: Functional tasks and Patient/family education   Hitesh Fouche B. Rutherford Nail, M.S., CCC-SLP, Mining engineer Certified Brain Injury [facility]  [place] Office [phone] Ascom [phone] Fax [phone]

## 2021-08-15 ENCOUNTER — Encounter: Payer: Medicare Other | Admitting: Speech Pathology

## 2021-08-17 ENCOUNTER — Encounter: Payer: Medicare Other | Admitting: Speech Pathology

## 2021-08-23 ENCOUNTER — Encounter: Payer: Medicare Other | Admitting: Speech Pathology

## 2021-08-25 ENCOUNTER — Encounter: Payer: Medicare Other | Admitting: Speech Pathology

## 2021-08-30 ENCOUNTER — Encounter: Payer: Medicare Other | Admitting: Speech Pathology

## 2021-09-01 ENCOUNTER — Encounter: Payer: Medicare Other | Admitting: Speech Pathology

## 2021-09-06 ENCOUNTER — Encounter: Payer: Medicare Other | Admitting: Speech Pathology

## 2021-09-08 ENCOUNTER — Encounter: Payer: Medicare Other | Admitting: Speech Pathology

## 2021-09-15 ENCOUNTER — Encounter: Payer: Medicare Other | Admitting: Speech Pathology

## 2021-09-22 ENCOUNTER — Encounter: Payer: Medicare Other | Admitting: Speech Pathology

## 2021-11-21 IMAGING — CR DG CHEST 2V
2 series · 2 of 2 positions shown · non-contrast
Comparison: 06/21/2020 chest radiograph

CLINICAL DATA: Cough, COVID exposure

EXAM:
CHEST - 2 VIEW

[chest pa]
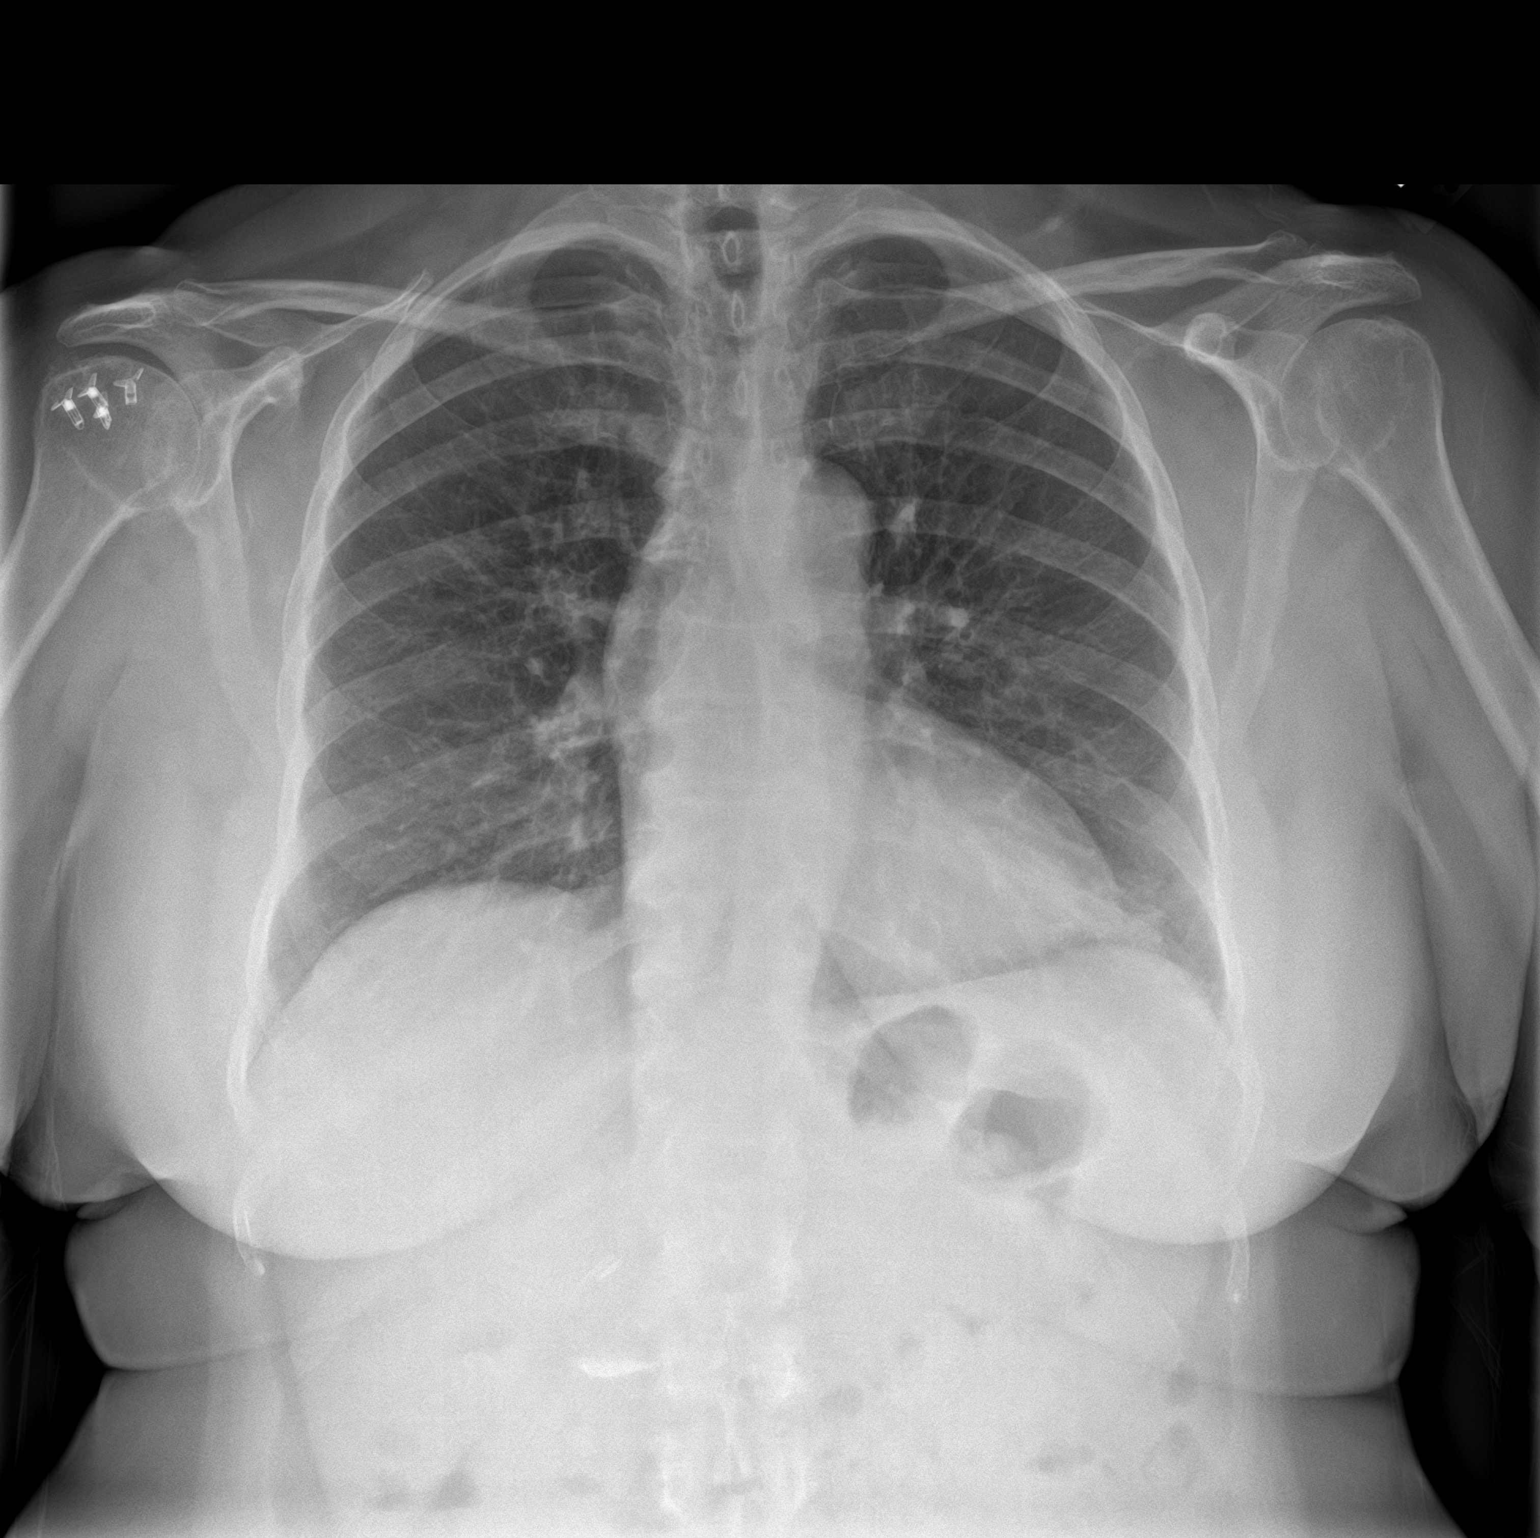

[chest lat]
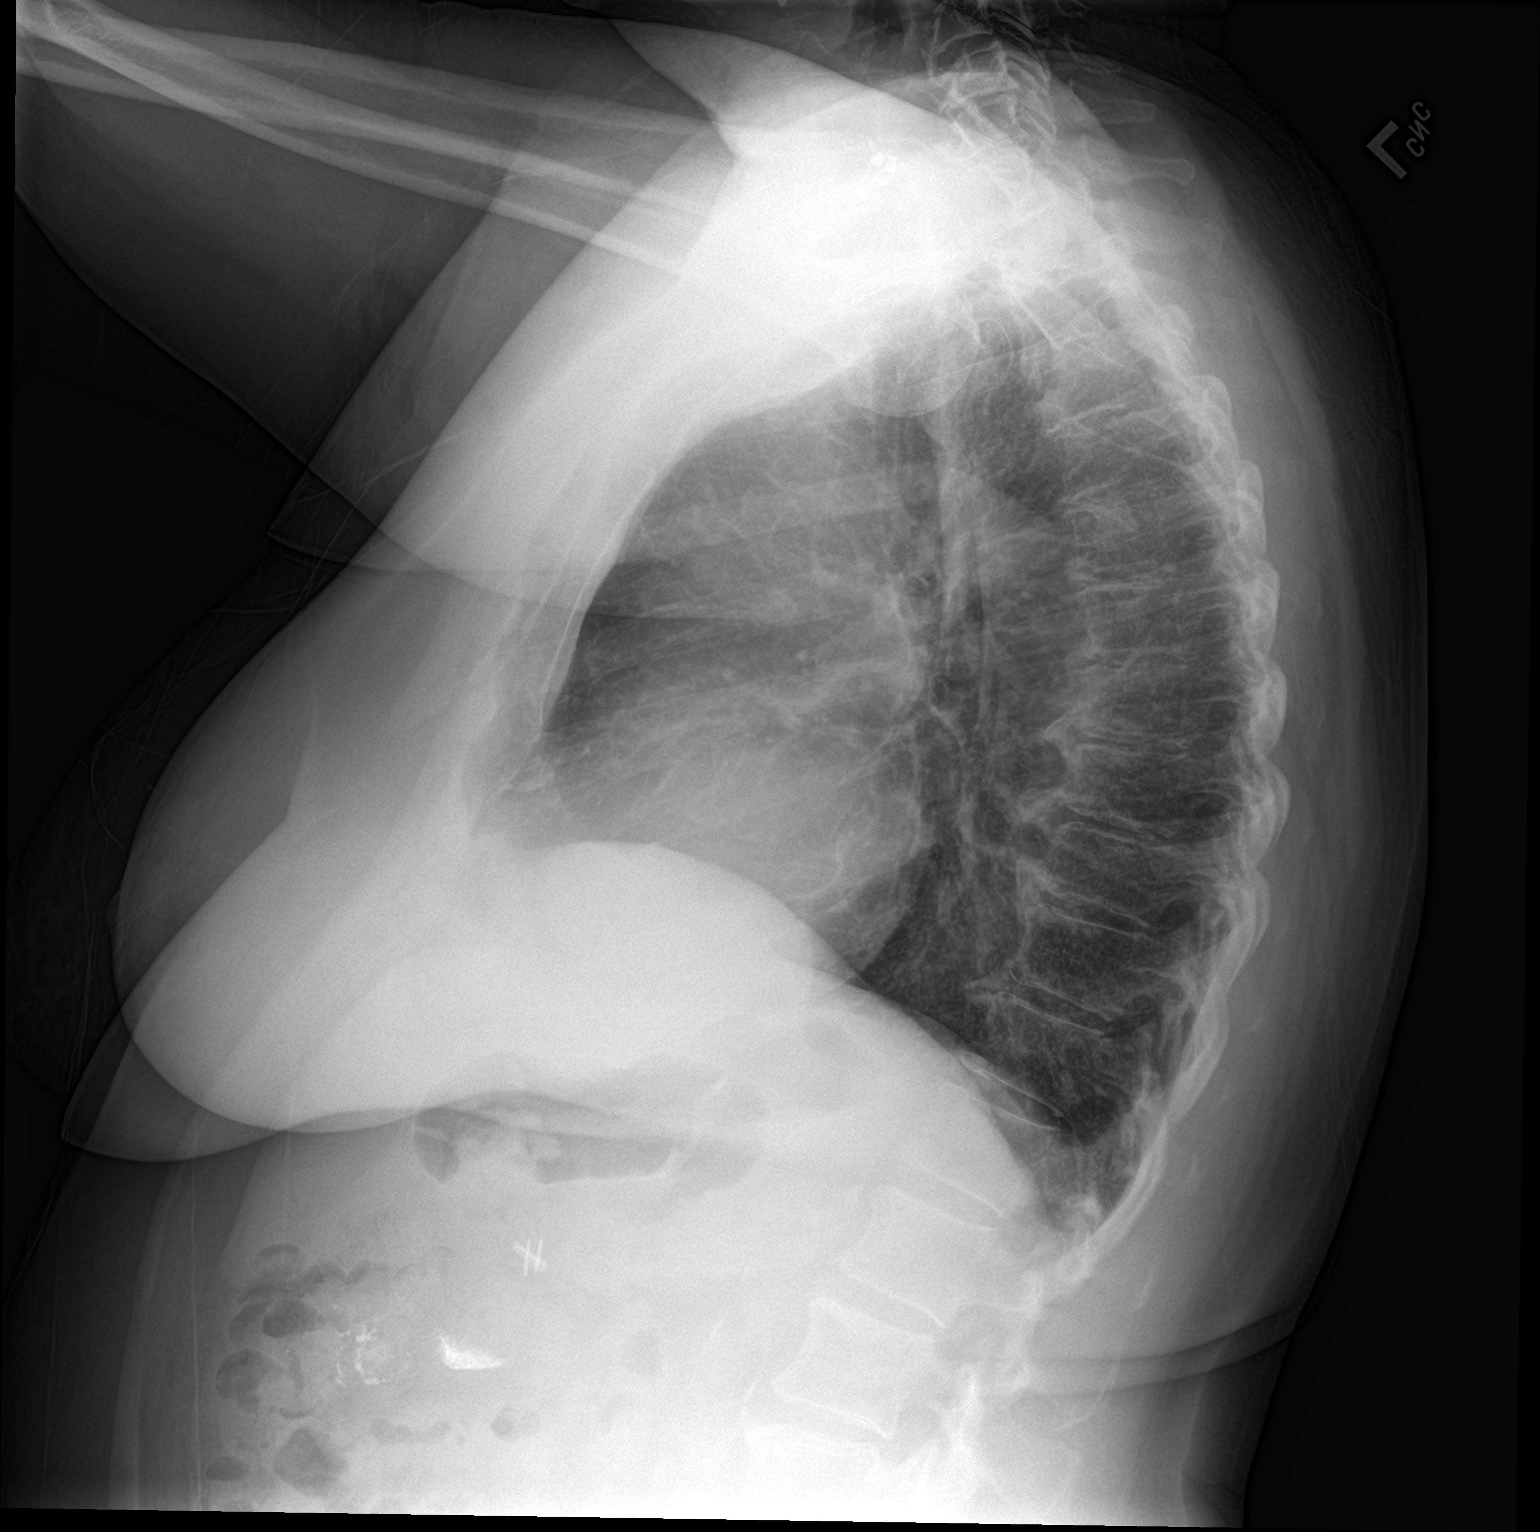

[2 of 2 positions shown; findings below may reference images not displayed]

FINDINGS: Cholecystectomy clips are seen in the right upper quadrant of the
abdomen. Soft tissue anchors overlie the right humeral head. Stable
cardiomediastinal silhouette with normal heart size. No
pneumothorax. No pleural effusion. Lungs appear clear, with no acute
consolidative airspace disease and no pulmonary edema.
IMPRESSION: No active cardiopulmonary disease.

## 2022-02-02 ENCOUNTER — Telehealth: Payer: Self-pay

## 2022-02-02 NOTE — Telephone Encounter (Signed)
[facility] sent urgent referral. Spoke with [place] on clarification for referral. Patient having skin issues on eyelid. Spoke with patient this morning and she was originally scheduled in November but called another dermatology practice and was scheduled in March. Patient's issue has since resolved after changing to different contacts. She currently has no issues. She will call me if issue starts again and we will look at working her in for urgent referral. aw

## 2022-04-25 NOTE — Progress Notes (Signed)
 Today the history is gathered from: 100% - patient  0% - patient alone today  RECORDS SUMMARY: No new records.  REFERRING PHYSICIAN: Rudolpho Norleen Lenis, MD PRIMARY CARE PHYSICIAN:  Rudolpho Norleen Lenis, MD   IMPRESSION/PLAN  Jordan Palmer is a 72 y.o. female presenting for evaluation of  MEMORY DIFFICULTY / OSA / B12 DEFICIENCY - Stable.  - Patient presents with ongoing memory difficulty. Notes difficulty with short-term memory recall.  Reports she occasionally repeats conversations and questions. Denies difficulty with face, names, family, friends, or recall of major events. No difficulty with sleep at this time and uses CPAP at night. Taking Aricept 10 mg nightly.  - SLUMS today was 25/30.  Stable from last SLUMS on file of 27/30. 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 - Continue taking Aricept 10 mg nightly.  - Continue to take a Vitamin B12 supplement 1000 mcg daily.  - Continue with scheduled labs in April 2024.  - Encouraged patient to complete games, puzzles, or read to stimulate the brain.   - Encouraged patient to stay physically active and exercise on regular basis (90 mins per week or 15 mins per day). Exercise can be very beneficial in many neurological conditions.  - Will continue to monitor memory every 6 months.  - No additional testing or imaging at this time.   Follow up with Lauraine Rocks PA-C in 6 months.   Medications previously tried: aricept (GI upset, vivid dreams)  Namenda (weight gain) Exelon (oral, hives)   p=4   CHIEF COMPLAINT & HPI  Jordan Palmer is a 72 y.o. female presenting for evaluation of: Chief Complaint  Patient presents with  . Memory Difficulty  . Sleep Apnea  . B12 Deficiency    MEMORY DIFFICULTY / OSA / B12 DEFICIENCY Reports she noticed a small decrease in her memory since her last visit. Reports difficulty with repeating conversations and questions about the same. No difficulty with names, faces, or  family and friends. No difficulty with recall of major events. Notes she has some short term memory difficulty. Denies difficulty with ADL's. Denies difficulty with multi-step processes. Manages her own medications. Patient uses pill pack weekly. Denies difficulty with driving. No sleep difficulty. Sleeps with CPAP machine. Reports mood is good. Denies anxiety and depression. Patient is active at home with the care of 5 dogs and maintaining the upkeep of her home. Taking Aricept 10 mg at night. SLUMS evaluation 25/30. Taking B12 supplement daily.   DATA SUMMARY: 08/03/2021 MRI BRAIN WO CONTRAST  IMPRESSION:  1. No evidence of acute intracranial abnormality.  2. Disproportionately prominent atrophy of the anterior left  temporal lobe.  3. Mild cerebral atrophy elsewhere.  4. Mild chronic small vessel image changes within the cerebral white  matter.  5. Right frontoethmoidal sinus disease, as described.    VISIT SUMMARIES:  10/26/20: Ongoing. STOP aricept. Start Exelon 4.6mg  patch.   07/22/20: Patient, new to me, with memory difficulty. Start aricept 5 mg nightly to slow the progression of memory loss.  MEDICATIONS Outpatient Medications Marked as Taking for the 04/25/22 encounter (Office Visit) with Rocks Lauraine Norris, PA  Medication Sig  . ACETAMINOPHEN  (TYLENOL  EXTRA STRENGTH ORAL) Take by mouth. As needed  . celecoxib  (CELEBREX ) 200 MG capsule Take 1 capsule (200 mg total) by mouth 2 (two) times daily  . cyanocobalamin, vitamin B-12, (VITAMIN B12 ORAL) Take by mouth  . donepeziL (ARICEPT) 10 MG tablet TAKE 1/2 TAB NIGHTLY FOR A WEEK THEN INCREASE TO 1 TAB  AND CONTINUE THAT DOSE (Patient taking differently: Take 10 mg by mouth Take 1/2 tab nightly for a week then increase to 1 tab and continue that dose)  . fexofenadine (ALLEGRA) 60 MG tablet Take 60 mg by mouth once daily as needed  . ibuprofen  (ADVIL ,MOTRIN ) 200 MG tablet Take 1 tablet (200 mg total) by mouth every 6 (six) hours as  needed for Pain  . levothyroxine  (SYNTHROID ) 88 MCG tablet TAKE 1 TABLET BY MOUTH ONCE DAILY TAKE ON AN EMPTY  STOMACH WITH A GLASS OF  WATER AT LEAST 30 TO 60  MINUTES BEFORE BREAKFAST  . lisinopriL -hydroCHLOROthiazide  (ZESTORETIC ) 20-25 mg tablet TAKE 1 TABLET BY MOUTH ONCE DAILY  . predniSONE  (DELTASONE ) 5 MG tablet Take 5 mg by mouth once daily  . rosuvastatin  (CRESTOR ) 5 MG tablet TAKE 1 TABLET BY MOUTH ONCE DAILY    ALLERGIES Allergies  Allergen Reactions  . Codeine Nausea  . Darvocet A500 [Propoxyphene N-Acetaminophen ] Nausea  . Doxycycline Photosensitivity  . Exelon [Rivastigmine] Hives     EXAM   Vitals:   04/25/22 1000  BP: 131/80  Pulse: 70  Weight: 76.3 kg (168 lb 3.2 oz)  Height: 152.4 cm (5')  PainSc: 0-No pain      Body mass index is 32.85 kg/m.  GENERAL: Pleasant female, NAD.  Normocephalic and atraumatic.  The baseline comprehensive neurological exam obtained by Lauraine Rocks on 07/22/20 is included below.  Significant changes from today's visit are shown in bold.  MUSCULOSKELETAL: Bulk - Normal Tone - Normal Pronator Drift - Not assessed Ambulation - Gait and station is steady Romberg - Not assessed  NEUROLOGICAL: MENTAL STATUS: Patient is oriented to person, place and time.   Short-term memory is very mildly diminished. Long-term memory is intact.   Attention span and concentration are intact.   Naming and repetition are intact. Comprehension is intact.   Expressive speech is intact.   Patient's fund of knowledge is within normal limits for educational level.  MEMORY EVALUATION: MMSE  07/18/2021 - 30/30  01/17/2022 - 28/30   SLUMS 10/18/2021 - 27/30  04/25/2022 - 25/30  PAST MEDICAL HISTORY Past Medical History:  Diagnosis Date  . Allergic state   . Chickenpox   . Synovitis     PAST SURGICAL HISTORY Past Surgical History:  Procedure Laterality Date  . COLONOSCOPY  10/05/2006   Int Hemorrhoids, Diverticulosis: CBF 09/2016;  Recall Ltr mailed 08/11/2016 (dw)  . Subtotal thyroidectomy  2011  . Nasal lacrimal duct probing  2013  . EYELID SURGERY Right 2013   DCR with Rollene Pouch  . EGD  04/24/2011   No repeat per RTE  . COLONOSCOPY  11/02/2016   Int Hemorrhoids, Diverticulosis: CBF 10/2026  . CHOLECYSTECTOMY     2001  . ENDOSCOPIC CARPAL TUNNEL RELEASE Right    01-14-2004  . ENDOSCOPIC CARPAL TUNNEL RELEASE Left    12/13/2005  . EYELID SURGERY Right approx 10 years ago   entropian repair/DCR-with [facility] Tube-[facility]  . Hemithyroidectomy Right   . HYSTERECTOMY    . KNEE ARTHROSCOPY     partial medial and lateral meniscectomies and chondroplasty 11/20/2007  . REPAIR ENTROPION Right    lower eye lid 2007  . trigger thumb release     2008    FAMILY HISTORY Family History  Problem Relation Age of Onset  . Cataracts Mother   . Alzheimer's disease Mother   . Myocardial Infarction (Heart attack) Father   . Diabetes type II Father   . High blood pressure (  Hypertension) Father   . No Known Problems Brother   . Glaucoma Neg Hx   . Macular degeneration Neg Hx   . Strabismus Neg Hx     SOCIAL HISTORY  Social History   Tobacco Use  . Smoking status: Never  . Smokeless tobacco: Never  Substance Use Topics  . Alcohol use: Yes    Alcohol/week: 0.0 standard drinks of alcohol    Comment: Occasionally  . Drug use: No     REVIEW OF SYSTEMS:  13 system ROS was verbally reviewed with patient. Pertinent positives and negatives are mentioned above in the HPI and all other systems are negative.    DATA   No visits with results within 6 Month(s) from this visit.  Latest known visit with results is:  Appointment on 10/24/2021  Component Date Value Ref Range Status  . Glucose 10/24/2021 81  70 - 110 mg/dL Final  . Sodium 90/74/7976 141  136 - 145 mmol/L Final  . Potassium 10/24/2021 4.4  3.6 - 5.1 mmol/L Final  . Chloride 10/24/2021 106  97 - 109 mmol/L Final  . Carbon Dioxide (CO2) 10/24/2021  26.6  22.0 - 32.0 mmol/L Final  . Calcium  10/24/2021 9.7  8.7 - 10.3 mg/dL Final  . Urea Nitrogen (BUN) 10/24/2021 18  7 - 25 mg/dL Final  . Creatinine 90/74/7976 0.8  0.6 - 1.1 mg/dL Final  . Glomerular Filtration Rate (eGFR) 10/24/2021 79  >60 mL/min/1.73sq m Final  . BUN/Crea Ratio 10/24/2021 22.5 (H)  6.0 - 20.0 Final  . Anion Gap w/K 10/24/2021 12.8  6.0 - 16.0 Final  . Thyroid  Stimulating Hormone (TSH) 10/24/2021 3.036  0.450-5.330 uIU/ml uIU/mL Final  . Vitamin B12 10/24/2021 757  >300 pg/mL Final      No follow-ups on file.  Payor: UHC MEDICARE ADVANTAGE PLAN / Plan: [facility] HMO POS AARP MEDICARE ADVANTAGE / Product Type: HMO /   This note is partially prepared by Monico Childes, Scribe, in the presence of and acting as the scribe of Lauraine Rocks, PA-C.   I agree that the scribe documentation is complete and accurate.  This note was generated in part with voice recognition software and I apologize for any typographical errors that were not detected and corrected.     Attestation Statement:   I personally performed the service, non-incident to. (WP)   SARAH ALMARIE ROCKS, PA

## 2022-05-09 ENCOUNTER — Other Ambulatory Visit: Payer: Self-pay | Admitting: Internal Medicine

## 2022-05-09 DIAGNOSIS — Z1231 Encounter for screening mammogram for malignant neoplasm of breast: Secondary | ICD-10-CM

## 2022-06-12 ENCOUNTER — Ambulatory Visit
Admission: RE | Admit: 2022-06-12 | Discharge: 2022-06-12 | Disposition: A | Discharge status: Home / Self Care | Payer: Medicare Other | Source: Ambulatory Visit | Attending: Internal Medicine | Admitting: Internal Medicine

## 2022-06-12 DIAGNOSIS — Z1231 Encounter for screening mammogram for malignant neoplasm of breast: Secondary | ICD-10-CM | POA: Insufficient documentation

## 2022-07-08 ENCOUNTER — Other Ambulatory Visit: Payer: Self-pay

## 2022-07-08 ENCOUNTER — Emergency Department
Admission: EM | Admit: 2022-07-08 | Discharge: 2022-07-08 | Disposition: A | Discharge status: Home / Self Care | Payer: Medicare Other | Attending: Emergency Medicine | Admitting: Emergency Medicine

## 2022-07-08 ENCOUNTER — Encounter: Payer: Self-pay | Admitting: Emergency Medicine

## 2022-07-08 DIAGNOSIS — T782XXA Anaphylactic shock, unspecified, initial encounter: Secondary | ICD-10-CM | POA: Diagnosis not present

## 2022-07-08 DIAGNOSIS — T783XXA Angioneurotic edema, initial encounter: Secondary | ICD-10-CM | POA: Diagnosis not present

## 2022-07-08 DIAGNOSIS — Z96653 Presence of artificial knee joint, bilateral: Secondary | ICD-10-CM | POA: Diagnosis not present

## 2022-07-08 DIAGNOSIS — E039 Hypothyroidism, unspecified: Secondary | ICD-10-CM | POA: Insufficient documentation

## 2022-07-08 DIAGNOSIS — T7840XA Allergy, unspecified, initial encounter: Secondary | ICD-10-CM | POA: Diagnosis present

## 2022-07-08 DIAGNOSIS — I10 Essential (primary) hypertension: Secondary | ICD-10-CM | POA: Insufficient documentation

## 2022-07-08 DIAGNOSIS — Z79899 Other long term (current) drug therapy: Secondary | ICD-10-CM | POA: Insufficient documentation

## 2022-07-08 MED ORDER — DIPHENHYDRAMINE HCL 50 MG/ML IJ SOLN
INTRAMUSCULAR | Status: AC
  Filled 2022-07-08: qty 1

## 2022-07-08 MED ORDER — DIPHENHYDRAMINE HCL 50 MG/ML IJ SOLN
25.0000 mg | Freq: Once | INTRAMUSCULAR | Status: AC
  Administered 2022-07-08: 25 mg via INTRAVENOUS
  Filled 2022-07-08: qty 1

## 2022-07-08 MED ORDER — METHYLPREDNISOLONE SODIUM SUCC 125 MG IJ SOLR: 125.0000 mg | Freq: Once | INTRAMUSCULAR | Status: AC

## 2022-07-08 MED ORDER — EPINEPHRINE 0.3 MG/0.3ML IJ SOAJ
INTRAMUSCULAR | Status: AC
  Filled 2022-07-08: qty 0.3

## 2022-07-08 MED ORDER — FAMOTIDINE 20 MG PO TABS: 20.0000 mg | ORAL_TABLET | Freq: Two times a day (BID) | ORAL | 0 refills | Status: DC

## 2022-07-08 MED ORDER — METHYLPREDNISOLONE SODIUM SUCC 125 MG IJ SOLR
INTRAMUSCULAR | Status: AC
  Administered 2022-07-08: 125 mg via INTRAVENOUS
  Filled 2022-07-08: qty 2

## 2022-07-08 MED ORDER — FAMOTIDINE IN NACL 20-0.9 MG/50ML-% IV SOLN
INTRAVENOUS | Status: AC
  Filled 2022-07-08: qty 50

## 2022-07-08 MED ORDER — SODIUM CHLORIDE 0.9 % IV BOLUS
1000.0000 mL | Freq: Once | INTRAVENOUS | Status: AC
  Administered 2022-07-08: 1000 mL via INTRAVENOUS

## 2022-07-08 MED ORDER — FAMOTIDINE IN NACL 20-0.9 MG/50ML-% IV SOLN
20.0000 mg | Freq: Once | INTRAVENOUS | Status: AC
  Administered 2022-07-08: 20 mg via INTRAVENOUS

## 2022-07-08 MED ORDER — DIPHENHYDRAMINE HCL 50 MG/ML IJ SOLN
25.0000 mg | Freq: Once | INTRAMUSCULAR | Status: AC
  Administered 2022-07-08: 25 mg via INTRAVENOUS

## 2022-07-08 MED ORDER — EPINEPHRINE 0.3 MG/0.3ML IJ SOAJ: 0.3000 mg | Freq: Once | INTRAMUSCULAR | Status: AC

## 2022-07-08 MED ORDER — EPINEPHRINE 0.3 MG/0.3ML IJ SOAJ: 0.3000 mg | Freq: Once | INTRAMUSCULAR | 1 refills | Status: AC

## 2022-07-08 MED ORDER — EPINEPHRINE 0.3 MG/0.3ML IJ SOAJ
INTRAMUSCULAR | Status: AC
  Administered 2022-07-08: 0.3 mg via INTRAMUSCULAR
  Filled 2022-07-08: qty 0.3

## 2022-07-08 MED ORDER — PREDNISONE 20 MG PO TABS: ORAL_TABLET | ORAL | 0 refills | Status: DC

## 2022-07-08 NOTE — Discharge Instructions (Signed)
1. Take the following medicines for the next 4 days: Prednisone 60mg daily Pepcid 20mg twice daily 2. Take Benadryl as needed for itching. 3. Use Epi-Pen in case of acute, life-threatening allergic reaction. 4. Return to the ER for worsening symptoms, persistent vomiting, difficulty breathing or other concerns.  

## 2022-07-08 NOTE — ED Notes (Signed)
Pt taken to the room via wheelchair; primary RN made aware of the patients arrival.

## 2022-07-08 NOTE — ED Triage Notes (Signed)
Pt presents ambulatory to triage via POV with complaints of allergic reaction to bees today. Pt was cutting her grass and was stung by bees in the face and has visible swelling. She notes having an itchy throat that started ~ 1 hour ago. Able to speak in complete sentences. She notes taking OTC antihistamines PTA with no improvement. A&Ox4 at this time. Denies CP or SOB.

## 2022-07-08 NOTE — ED Notes (Signed)
Facial swelling has gotten significantly better. Pt in NAD, denies any complaints.

## 2022-07-08 NOTE — ED Provider Notes (Signed)
[facility] Provider Note    Event Date/Time   First MD Initiated Contact with Patient 07/08/22 [phone]     (approximate)   History   Allergic Reaction   HPI  Jordan Palmer is a 72 y.o. female presents patient was cutting her grass yesterday afternoon and was stung by bees to her forehead.  Over the course of the afternoon her face became more more swollen.  Finally comes to the ED because she began to experience an itchy throat approximately 1 hour ago.  Presents with bilateral orbital edema, generalized facial edema.  Denies taking anything prior to arrival; states she did not have Benadryl at home.  Denies chest pain, shortness of breath, abdominal pain, nausea, vomiting or dizziness.     Past Medical History   Past Medical History:  Diagnosis Date   Arthritis    oa knees   Cystocele with prolapse    Hyperlipemia    Hypertension    Hypothyroidism    Sleep apnea    cpap set on 25   SUI (stress urinary incontinence, female)    Wears contact lenses      Active Problem List   Patient Active Problem List   Diagnosis Date Noted   S/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, left 12/23/2019   S/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, right 05/20/2019   H/O total vaginal hysterectomy 03/12/2017   Essential hypertension 03/08/2017   Mixed dyslipidemia 03/08/2017   Abnormal EKG 03/08/2017   Preoperative cardiovascular examination 03/08/2017   Allergic state 12/11/2013   High cholesterol 12/11/2013   History of depression 12/11/2013   Hypothyroidism 12/11/2013   Sleep apnea 12/11/2013     Past Surgical History   Past Surgical History:  Procedure Laterality Date   ABDOMINAL HYSTERECTOMY     ANTERIOR AND POSTERIOR REPAIR N/A 03/12/2017   Procedure: ANTERIOR (CYSTOCELE) AND POSTERIOR REPAIR (RECTOCELE);  Surgeon: Romualdo Bolk, MD;  [facility]  Service: Gynecology;  Laterality: N/A;   BLADDER  SUSPENSION N/A 05/03/2020   Procedure: TRANSVAGINAL TAPE (TVT) PROCEDURE;  Surgeon: Marguerita Beards, MD;  Location: [facility];  Service: Gynecology;  Laterality: N/A;   CARPAL TUNNEL RELEASE Bilateral yrs ago   CHOLECYSTECTOMY  2001   laparoscopic   CYSTOCELE REPAIR N/A 05/03/2020   Procedure: ANTERIOR REPAIR (CYSTOCELE);  Surgeon: Marguerita Beards, MD;  Location[facility];  Service: Gynecology;  Laterality: N/A;  total time needed for 3 procedures is 1.5hrs   CYSTOSCOPY N/A 03/12/2017   Procedure: CYSTOSCOPY;  Surgeon: Romualdo Bolk, MD;  Location: [facility];  Service: Gynecology;  Laterality: N/A;   CYSTOSCOPY N/A 05/03/2020   Procedure: CYSTOSCOPY;  Surgeon: Marguerita Beards, MD;  Location: [facility];  Service: Gynecology;  Laterality: N/A;   KNEE ARTHROSCOPY Left 12/2019   KNEE ARTHROSCOPY WITH MEDIAL MENISECTOMY Right 02/13/2019   Procedure: KNEE ARTHROSCOPY WITH MEDIAL MENISECTOMY;  Surgeon: Juanell Fairly, MD;  Location: [facility];  Service: Orthopedics;  Laterality: Right;   LACRIMAL DUCT PROBING W/ DACRYOPLASTY Bilateral    SALPINGOOPHORECTOMY Left 03/12/2017   Procedure: SALPINGECTOMY;  Surgeon: Romualdo Bolk, MD;  Location: [facility];  Service: Gynecology;  Laterality: Left;   SHOULDER ARTHROSCOPY WITH OPEN ROTATOR CUFF REPAIR Right 09/03/2014   Procedure: SHOULDER ARTHROSCOPY WITH OPEN ROTATOR CUFF REPAIR;  Surgeon: Juanell Fairly, MD;  Location: [facility];  Service: Orthopedics;  Laterality: Right;   THYROIDECTOMY  Right yrs ago   hemi-thyroidectomy   TOTAL KNEE ARTHROPLASTY Right 05/20/2019   Procedure: TOTAL KNEE ARTHROPLASTY;  Surgeon: Juanell Fairly, MD;  Location: [facility];  Service: Orthopedics;  Laterality: Right;   TOTAL KNEE ARTHROPLASTY Left 12/23/2019   Procedure: Left TOTAL KNEE ARTHROPLASTY;  Surgeon: Juanell Fairly, MD;  Location: [facility];  Service:  Orthopedics;  Laterality: Left;   VAGINAL HYSTERECTOMY N/A 03/12/2017   Procedure: HYSTERECTOMY VAGINAL;  Surgeon: Romualdo Bolk, MD;  Location: [facility];  Service: Gynecology;  Laterality: N/A;     Home Medications   Prior to Admission medications   Medication Sig Start Date End Date Taking? Authorizing Provider  EPINEPHrine 0.3 mg/0.3 mL IJ SOAJ injection Inject 0.3 mg into the muscle once for 1 dose. 07/08/22 07/08/22 Yes Irean Hong, MD  famotidine (PEPCID) 20 MG tablet Take 1 tablet (20 mg total) by mouth 2 (two) times daily. 07/08/22  Yes Irean Hong, MD  predniSONE (DELTASONE) 20 MG tablet 3 tablets daily x 4 days 07/08/22  Yes Irean Hong, MD  celecoxib (CELEBREX) 200 MG capsule Take 200 mg by mouth daily. 11/26/19   [provider]  conjugated estrogens (PREMARIN) vaginal cream 1/2 gram vaginally twice weekly Patient taking differently: Place 1 Applicatorful vaginally 2 (two) times a week. 01/13/19   Romualdo Bolk, MD  fexofenadine (ALLEGRA) 180 MG tablet Take 180 mg by mouth daily as needed for allergies.    [provider]  fluticasone (FLONASE) 50 MCG/ACT nasal spray Place 1 spray into both nostrils daily as needed for allergies.    [provider]  levothyroxine (SYNTHROID) 88 MCG tablet Take 88 mcg by mouth daily before breakfast.    [provider]  lisinopril-hydrochlorothiazide (PRINZIDE,ZESTORETIC) 20-25 MG per tablet Take 1 tablet by mouth daily.    [provider]  PRESCRIPTION MEDICATION Cyanocobalamin 1000 mcg vitamin b12 1 shot q week x 4 weeks , then will go to pill    [provider]  rosuvastatin (CRESTOR) 5 MG tablet Take 5 mg by mouth daily at 2 PM. Takes at 200 pm 07/01/15   [provider]  Scar Treatment Products (SCAR GEL EX) Apply 1 application topically daily.    [provider]     Allergies  Codeine and Doxycycline   Family History   Family History   Problem Relation Age of Onset   Diabetes type II Father    CAD Father    Alzheimer's disease Father    Alzheimer's disease Mother    Breast cancer Neg Hx      Physical Exam  Triage Vital Signs: ED Triage Vitals [07/08/22 0107]  Enc Vitals Group     BP      Pulse      Resp      Temp      Temp src      SpO2      Weight 165 lb 12.6 oz (75.2 kg)     Height 5' (1.524 m)     Head Circumference      Peak Flow      Pain Score 0     Pain Loc      Pain Edu?      Excl. in GC?     Updated Vital Signs: BP (!) 141/77   Pulse 78   Temp 98 F (36.7 C) (Oral)   Resp 19   Ht 5' (1.524 m)   Wt 75.2 kg   SpO2  96%   BMI 32.38 kg/m    General: Awake, mild distress.  CV:  RRR.  Good peripheral perfusion.  Resp:  Normal effort.  CTAB. Abd:  No distention.  Other:  Generalized facial edema, max immediately bilateral periorbital edema and edema to submandibular space.  There is no tongue or lip angioedema.  Phonation is normal.  Posterior oropharynx clear.  There is no hoarse or muffled voice.  There is no drooling.  Tolerating secretions well.   ED Results / Procedures / Treatments  Labs (all labs ordered are listed, but only abnormal results are displayed) Labs Reviewed - No data to display   EKG  ED ECG REPORT I, Ethanjames Fontenot J, the attending physician, personally viewed and interpreted this ECG.   Date: 07/08/2022  EKG Time: 0114  Rate: 79  Rhythm: normal sinus rhythm  Axis: Normal  Intervals:none  ST&T Change: Nonspecific   RADIOLOGY None   Official radiology report(s): No results found.   PROCEDURES:  Critical Care performed: Yes, see critical care procedure note(s)  CRITICAL CARE Performed by: Irean Hong   Total critical care time: 45 minutes  Critical care time was exclusive of separately billable procedures and treating other patients.  Critical care was necessary to treat or prevent imminent or life-threatening deterioration.  Critical care  was time spent personally by me on the following activities: development of treatment plan with patient and/or surrogate as well as nursing, discussions with consultants, evaluation of patient's response to treatment, examination of patient, obtaining history from patient or surrogate, ordering and performing treatments and interventions, ordering and review of laboratory studies, ordering and review of radiographic studies, pulse oximetry and re-evaluation of patient's condition.   Marland Kitchen1-3 Lead EKG Interpretation  Performed by: Irean Hong, MD Authorized by: Irean Hong, MD     Interpretation: normal     ECG rate:  80   ECG rate assessment: normal     Rhythm: sinus rhythm     Ectopy: none     Conduction: normal   Comments:     Placed on cardiac monitor to evaluate for arrhythmias    MEDICATIONS ORDERED IN ED: Medications  sodium chloride 0.9 % bolus 1,000 mL (0 mLs Intravenous Stopped 07/08/22 0205)  EPINEPHrine (EPI-PEN) injection 0.3 mg (0.3 mg Intramuscular Given 07/08/22 0113)  diphenhydrAMINE (BENADRYL) injection 25 mg (25 mg Intravenous Given 07/08/22 0115)  methylPREDNISolone sodium succinate (SOLU-MEDROL) 125 mg/2 mL injection 125 mg (125 mg Intravenous Given 07/08/22 0115)  famotidine (PEPCID) IVPB 20 mg premix (0 mg Intravenous Stopped 07/08/22 0150)  diphenhydrAMINE (BENADRYL) injection 25 mg (25 mg Intravenous Given 07/08/22 0312)     IMPRESSION / MDM / ASSESSMENT AND PLAN / ED COURSE  I reviewed the triage vital signs and the nursing notes.                             72 year old female presenting with acute allergic reaction angioedema.  Will administer allergic reaction cocktail consisting, IV fluids, IV Benadryl/Solu-Medrol/Pepcid.  Will continue to monitor and care for patient.  Patient's presentation is most consistent with acute presentation with potential threat to life or bodily function.  The patient is on the cardiac monitor to evaluate for evidence of arrhythmia  and/or significant heart rate changes.  0244 Swelling improved.  Posterior oropharynx remains clear.  Phonation intact.  Saturation 96%.  Still complaining of itching, will administer additional Benadryl.  0524 Swelling improved significantly. Itchiness resolved.  Will discharge home on prednisone/Pepcid, EpiPen.  Patient will follow-up closely with her PCP.  Strict return precautions given.  Patient and family member verbalized understanding and agreed with plan of care.  FINAL CLINICAL IMPRESSION(S) / ED DIAGNOSES   Final diagnoses:  Allergic reaction, initial encounter  Anaphylaxis, initial encounter  Angioedema, initial encounter     Rx / DC Orders   ED Discharge Orders          Ordered    predniSONE (DELTASONE) 20 MG tablet        07/08/22 0526    famotidine (PEPCID) 20 MG tablet  2 times daily        07/08/22 0526    EPINEPHrine 0.3 mg/0.3 mL IJ SOAJ injection   Once        07/08/22 1610             Note:  This document was prepared using Dragon voice recognition software and may include unintentional dictation errors.   Irean Hong, MD 07/08/22 [phone]

## 2022-08-23 ENCOUNTER — Encounter: Payer: Self-pay | Admitting: Physical Therapy

## 2022-08-23 ENCOUNTER — Ambulatory Visit: Payer: Medicare Other | Attending: Orthopedic Surgery

## 2022-08-23 DIAGNOSIS — M25511 Pain in right shoulder: Secondary | ICD-10-CM | POA: Insufficient documentation

## 2022-08-23 DIAGNOSIS — M6281 Muscle weakness (generalized): Secondary | ICD-10-CM | POA: Diagnosis present

## 2022-08-23 NOTE — Therapy (Signed)
OUTPATIENT PHYSICAL THERAPY SHOULDER EVALUATION   Patient Name: Jordan Palmer MRN: [id number] DOB:1951-01-22, 72 y.o., female Today's Date: 08/23/2022  END OF SESSION:  PT End of Session - 08/23/22 0940     Visit Number 1    Number of Visits 13    Date for PT Re-Evaluation 10/04/22    PT Start Time 0945    PT Stop Time 1029    PT Time Calculation (min) 44 min    Activity Tolerance Patient tolerated treatment well    Behavior During Therapy WFL for tasks assessed/performed             Past Medical History:  Diagnosis Date   Arthritis    oa knees   Cystocele with prolapse    Hyperlipemia    Hypertension    Hypothyroidism    Sleep apnea    cpap set on 25   SUI (stress urinary incontinence, female)    Wears contact lenses    Past Surgical History:  Procedure Laterality Date   ABDOMINAL HYSTERECTOMY     ANTERIOR AND POSTERIOR REPAIR N/A 03/12/2017   Procedure: ANTERIOR (CYSTOCELE) AND POSTERIOR REPAIR (RECTOCELE);  Surgeon: Romualdo Bolk, MD;  Location: [facility];  Service: Gynecology;  Laterality: N/A;   BLADDER SUSPENSION N/A 05/03/2020   Procedure: TRANSVAGINAL TAPE (TVT) PROCEDURE;  Surgeon: Marguerita Beards, MD;  Location: [facility];  Service: Gynecology;  Laterality: N/A;   CARPAL TUNNEL RELEASE Bilateral yrs ago   CHOLECYSTECTOMY  2001   laparoscopic   CYSTOCELE REPAIR N/A 05/03/2020   Procedure: ANTERIOR REPAIR (CYSTOCELE);  Surgeon: Marguerita Beards, MD;  Location: [facility];  Service: Gynecology;  Laterality: N/A;  total time needed for 3 procedures is 1.5hrs   CYSTOSCOPY N/A 03/12/2017   Procedure: CYSTOSCOPY;  Surgeon: Romualdo Bolk, MD;  Location: [facility];  Service: Gynecology;  Laterality: N/A;   CYSTOSCOPY N/A 05/03/2020   Procedure: CYSTOSCOPY;  Surgeon: Marguerita Beards, MD;  Location: [facility];  Service: Gynecology;  Laterality: N/A;   KNEE  ARTHROSCOPY Left 12/2019   KNEE ARTHROSCOPY WITH MEDIAL MENISECTOMY Right 02/13/2019   Procedure: KNEE ARTHROSCOPY WITH MEDIAL MENISECTOMY;  Surgeon: Juanell Fairly, MD;  Location: [facility];  Service: Orthopedics;  Laterality: Right;   LACRIMAL DUCT PROBING W/ DACRYOPLASTY Bilateral    SALPINGOOPHORECTOMY Left 03/12/2017   Procedure: SALPINGECTOMY;  Surgeon: Romualdo Bolk, MD;  Location: [facility];  Service: Gynecology;  Laterality: Left;   SHOULDER ARTHROSCOPY WITH OPEN ROTATOR CUFF REPAIR Right 09/03/2014   Procedure: SHOULDER ARTHROSCOPY WITH OPEN ROTATOR CUFF REPAIR;  Surgeon: Juanell Fairly, MD;  Location: [facility];  Service: Orthopedics;  Laterality: Right;   THYROIDECTOMY Right yrs ago   hemi-thyroidectomy   TOTAL KNEE ARTHROPLASTY Right 05/20/2019   Procedure: TOTAL KNEE ARTHROPLASTY;  Surgeon: Juanell Fairly, MD;  Location: [facility];  Service: Orthopedics;  Laterality: Right;   TOTAL KNEE ARTHROPLASTY Left 12/23/2019   Procedure: Left TOTAL KNEE ARTHROPLASTY;  Surgeon: Juanell Fairly, MD;  Location: [facility];  Service: Orthopedics;  Laterality: Left;   VAGINAL HYSTERECTOMY N/A 03/12/2017   Procedure: HYSTERECTOMY VAGINAL;  Surgeon: Romualdo Bolk, MD;  Location: [facility];  Service: Gynecology;  Laterality: N/A;   Patient Active Problem List   Diagnosis Date Noted   S/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, left 12/23/2019   S/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, right 05/20/2019   H/O total vaginal  hysterectomy 03/12/2017   Essential hypertension 03/08/2017   Mixed dyslipidemia 03/08/2017   Abnormal EKG 03/08/2017   Preoperative cardiovascular examination 03/08/2017   Allergic state 12/11/2013   High cholesterol 12/11/2013   History of depression 12/11/2013   Hypothyroidism 12/11/2013   Sleep apnea 12/11/2013    PCP: Gracelyn Nurse, MD  REFERRING PROVIDER: Juanell Fairly, MD  REFERRING DIAG: M75.41  (ICD-10-CM) - Impingement syndrome of right shoulder  THERAPY DIAG:  Right shoulder pain, unspecified chronicity  Muscle weakness (generalized)  Rationale for Evaluation and Treatment: Rehabilitation  ONSET DATE: > 6 months   SUBJECTIVE:                                                                                                                                                                                      SUBJECTIVE STATEMENT: Began at least 6 months ago but has worsened recently. Had shoulder arthroscopy with open rotator cuff repair in 2016 by Dr. Nyoka Lint. Patient reports pain into R hand (pinky and ring finger). Patient reports pain at rest and reaching overhead. Patient has difficulty holding heavy objects (>5 lbs). Medication (ibuprofen primarily) helps mediate pain.  Unknown MOI. Feels as though it may have something to do with previous surgery on R shoulder years ago.   Hand dominance: Right  PERTINENT HISTORY: S/p R shoulder arthroscopy with open rotator cuff repair in 08/2014  PAIN:  Are you having pain? Yes: NPRS scale: 4-5/10 Pain location: R shoulder Pain description: "painful" Aggravating factors: rest, reaching overhead  Relieving factors: medication  PRECAUTIONS: None  RED FLAGS: None   WEIGHT BEARING RESTRICTIONS: No  FALLS:  Has patient fallen in last 6 months? 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      SENSATION: WFL  POSTURE: Rounded shoulders, forward head posture   UPPER EXTREMITY ROM:   Active ROM  Right eval Left eval  Shoulder flexion 120 140  Shoulder extension    Shoulder abduction 118 138  Shoulder adduction    Shoulder internal rotation    Shoulder external rotation    (Blank rows = not tested)  UPPER EXTREMITY MMT:  MMT Right eval Left eval  Shoulder flexion 4- 4+  Shoulder extension    Shoulder abduction 4- 4  Shoulder adduction    Shoulder internal rotation 4 4  Shoulder external rotation 4 4  (Blank rows = not tested)  * = pain  SHOULDER SPECIAL TESTS: performed on R shoulder  Impingement tests: Neer impingement test: positive  and Hawkins/Kennedy impingement test: positive  Rotator cuff assessment: Empty can test: positive , Full can test: positive , and External rotation lag sign: negative Biceps assessment: Speed's test: positive   JOINT MOBILITY TESTING:  WFL  PALPATION:  TTP R coracoid process and R supraspinatus tendon insertion (+) trigger point R supraspinatus   TODAY'S TREATMENT:                                                                                                                                         DATE: 08/23/22  Evaluation completed, HEP handout provided. Discussed and demonstrated self mobilization of R supraspinatus to relieve tension.   PATIENT EDUCATION: Education details: HEP, goals, POC  Person educated: Patient Education method: Explanation, Demonstration, and Handouts Education comprehension: verbalized understanding and returned demonstration  HOME EXERCISE PROGRAM: Access Code: [email or web address] URL: [email or web address] Date: 08/23/2022 Prepared by: Maylon Peppers  Exercises - Seated Scapular Retraction  - 2-3 x daily - 5-7 x weekly - 3 sets - 10 reps - Standing Isometric Shoulder Internal Rotation at Doorway  - 2 x daily - 5 x weekly - 3 sets - 10 reps - Standing Isometric Shoulder External Rotation with Doorway  - 2 x daily - 5 x weekly - 3 sets - 10 reps  ASSESSMENT:  CLINICAL  IMPRESSION: Patient is a 72 y.o. female who was seen today for physical therapy evaluation and treatment for R shoulder pain. Patient reports pain at rest and overhead reaching with pain extending down to hand. Special test suggestive of R shoulder impingement/rotator cuff syndrome with positive findings with Neer's, Hawkins-Kennedy, and painful arc. Patient demonstrates impaired R shoulder ROM 2/2 pain, weakness, and impaired functional use of UE. (+) trigger points noted in supraspinatus with palpation. Patient will benefit from skilled PT services to address listed impairments and improve UE use with decreased pain.   OBJECTIVE IMPAIRMENTS: decreased activity tolerance, decreased endurance, decreased ROM, decreased strength, increased muscle spasms, impaired UE functional use, postural dysfunction, and pain.   ACTIVITY LIMITATIONS: carrying, lifting, sleeping, dressing, and reach over head  PARTICIPATION LIMITATIONS: laundry, community activity, and yard work  PERSONAL FACTORS: Age, Past/current experiences, and Time since onset of injury/illness/exacerbation are also affecting patient's functional outcome.   REHAB POTENTIAL: Good  CLINICAL DECISION MAKING: Stable/uncomplicated  EVALUATION COMPLEXITY: Low   GOALS: Goals reviewed with patient? Yes  SHORT TERM GOALS: Target date: 09/13/2022  Patient will be independent in HEP to improve strength/mobility for better functional independence with ADLs. Baseline: 7/24: see above Goal status: INITIAL  LONG TERM GOALS: Target date: 10/04/2022  Patient will increase FOTO score to equal to or greater than 66 to demonstrate statistically  significant improvement in UE use and pain management Baseline: 7/24: 55 Goal status: INITIAL  2.  Patient will improve R shoulder AROM to symmetrical range in flexion and abduction to L shoulder with pain <2/10 for improved ability to perform overhead activities. Baseline: 7/24: see above Goal status:  INITIAL  3.  Patient will increase B UE strength by 1/2 MMT grade to improve functional strength and ability to perform overhead activities and  Baseline: 7/24: see above Goal status: INITIAL  PLAN:  PT FREQUENCY: 1-2x/week  PT DURATION: 6 weeks  PLANNED INTERVENTIONS: Therapeutic exercises, Therapeutic activity, Neuromuscular re-education, Balance training, Gait training, Patient/Family education, Self Care, Joint mobilization, Dry Needling, Spinal mobilization, Cryotherapy, Moist heat, Manual therapy, and Re-evaluation  PLAN FOR NEXT SESSION: HEP review, manual therapy    Maylon Peppers, PT, DPT Physical Therapist - [place]  [facility]  08/23/2022, 11:25 AM

## 2022-08-28 ENCOUNTER — Ambulatory Visit: Payer: Medicare Other | Admitting: Physical Therapy

## 2022-08-28 ENCOUNTER — Encounter: Payer: Self-pay | Admitting: Physical Therapy

## 2022-08-28 DIAGNOSIS — M25511 Pain in right shoulder: Secondary | ICD-10-CM | POA: Diagnosis not present

## 2022-08-28 DIAGNOSIS — M6281 Muscle weakness (generalized): Secondary | ICD-10-CM

## 2022-08-28 NOTE — Therapy (Signed)
OUTPATIENT PHYSICAL THERAPY SHOULDER TREATMENT  Patient Name: Jordan Palmer MRN: [id number] DOB:1950/09/21, 72 y.o., female Today's Date: 08/28/2022  END OF SESSION:  PT End of Session - 08/28/22 0943     Visit Number 2    Number of Visits 13    Date for PT Re-Evaluation 10/04/22    PT Start Time 0943    PT Stop Time 1033    PT Time Calculation (min) 50 min    Activity Tolerance Patient tolerated treatment well    Behavior During Therapy [facility] for tasks assessed/performed             Past Medical History:  Diagnosis Date   Arthritis    oa knees   Cystocele with prolapse    Hyperlipemia    Hypertension    Hypothyroidism    Sleep apnea    cpap set on 25   SUI (stress urinary incontinence, female)    Wears contact lenses    Past Surgical History:  Procedure Laterality Date   ABDOMINAL HYSTERECTOMY     ANTERIOR AND POSTERIOR REPAIR N/A 03/12/2017   Procedure: ANTERIOR (CYSTOCELE) AND POSTERIOR REPAIR (RECTOCELE);  Surgeon: Romualdo Bolk, MD;  Location: [facility];  Service: Gynecology;  Laterality: N/A;   BLADDER SUSPENSION N/A 05/03/2020   Procedure: TRANSVAGINAL TAPE (TVT) PROCEDURE;  Surgeon: Marguerita Beards, MD;  Location: [facility];  Service: Gynecology;  Laterality: N/A;   CARPAL TUNNEL RELEASE Bilateral yrs ago   CHOLECYSTECTOMY  2001   laparoscopic   CYSTOCELE REPAIR N/A 05/03/2020   Procedure: ANTERIOR REPAIR (CYSTOCELE);  Surgeon: Marguerita Beards, MD;  Location: [facility];  Service: Gynecology;  Laterality: N/A;  total time needed for 3 procedures is 1.5hrs   CYSTOSCOPY N/A 03/12/2017   Procedure: CYSTOSCOPY;  Surgeon: Romualdo Bolk, MD;  Location: [facility];  Service: Gynecology;  Laterality: N/A;   CYSTOSCOPY N/A 05/03/2020   Procedure: CYSTOSCOPY;  Surgeon: Marguerita Beards, MD;  Location: [facility];  Service: Gynecology;  Laterality: N/A;   KNEE  ARTHROSCOPY Left 12/2019   KNEE ARTHROSCOPY WITH MEDIAL MENISECTOMY Right 02/13/2019   Procedure: KNEE ARTHROSCOPY WITH MEDIAL MENISECTOMY;  Surgeon: Juanell Fairly, MD;  Location: [facility];  Service: Orthopedics;  Laterality: Right;   LACRIMAL DUCT PROBING W/ DACRYOPLASTY Bilateral    SALPINGOOPHORECTOMY Left 03/12/2017   Procedure: SALPINGECTOMY;  Surgeon: Romualdo Bolk, MD;  Location: [facility];  Service: Gynecology;  Laterality: Left;   SHOULDER ARTHROSCOPY WITH OPEN ROTATOR CUFF REPAIR Right 09/03/2014   Procedure: SHOULDER ARTHROSCOPY WITH OPEN ROTATOR CUFF REPAIR;  Surgeon: Juanell Fairly, MD;  Location: [facility];  Service: Orthopedics;  Laterality: Right;   THYROIDECTOMY Right yrs ago   hemi-thyroidectomy   TOTAL KNEE ARTHROPLASTY Right 05/20/2019   Procedure: TOTAL KNEE ARTHROPLASTY;  Surgeon: Juanell Fairly, MD;  Location: [facility];  Service: Orthopedics;  Laterality: Right;   TOTAL KNEE ARTHROPLASTY Left 12/23/2019   Procedure: Left TOTAL KNEE ARTHROPLASTY;  Surgeon: Juanell Fairly, MD;  Location: [facility];  Service: Orthopedics;  Laterality: Left;   VAGINAL HYSTERECTOMY N/A 03/12/2017   Procedure: HYSTERECTOMY VAGINAL;  Surgeon: Romualdo Bolk, MD;  Location: [facility];  Service: Gynecology;  Laterality: N/A;   Patient Active Problem List   Diagnosis Date Noted   S/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, left 12/23/2019   S/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, right 05/20/2019   H/O total vaginal hysterectomy  03/12/2017   Essential hypertension 03/08/2017   Mixed dyslipidemia 03/08/2017   Abnormal EKG 03/08/2017   Preoperative cardiovascular examination 03/08/2017   Allergic state 12/11/2013   High cholesterol 12/11/2013   History of depression 12/11/2013   Hypothyroidism 12/11/2013   Sleep apnea 12/11/2013    PCP: Gracelyn Nurse, MD  REFERRING PROVIDER: Juanell Fairly, MD  REFERRING DIAG: M75.41  (ICD-10-CM) - Impingement syndrome of right shoulder  THERAPY DIAG:  Right shoulder pain, unspecified chronicity  Muscle weakness (generalized)  Rationale for Evaluation and Treatment: Rehabilitation  ONSET DATE: > 6 months   SUBJECTIVE:                                                                                                                                                                                      SUBJECTIVE STATEMENT: Began at least 6 months ago but has worsened recently. Had shoulder arthroscopy with open rotator cuff repair in 2016 by Dr. Nyoka Lint. Patient reports pain into R hand (pinky and ring finger). Patient reports pain at rest and reaching overhead. Patient has difficulty holding heavy objects (>5 lbs). Medication (ibuprofen primarily) helps mediate pain.  Unknown MOI. Feels as though it may have something to do with previous surgery on R shoulder years ago.   Hand dominance: Right  PERTINENT HISTORY: S/p R shoulder arthroscopy with open rotator cuff repair in 08/2014  PAIN:  Are you having pain? Yes: NPRS scale: 4-5/10 Pain location: R shoulder Pain description: "painful" Aggravating factors: rest, reaching overhead  Relieving factors: medication  PRECAUTIONS: None  RED FLAGS: None   WEIGHT BEARING RESTRICTIONS: No  FALLS:  Has patient fallen in last 6 months? 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      SENSATION: WFL  POSTURE: Rounded shoulders, forward head posture   UPPER EXTREMITY ROM:   Active ROM  Right eval Left eval  Shoulder flexion 120 140  Shoulder extension    Shoulder abduction 118 138  Shoulder adduction    Shoulder internal rotation    Shoulder external rotation    (Blank rows = not tested)  UPPER EXTREMITY MMT:  MMT Right eval Left eval  Shoulder flexion 4- 4+  Shoulder extension    Shoulder abduction 4- 4  Shoulder adduction    Shoulder internal rotation 4 4  Shoulder external rotation 4 4  (Blank rows = not tested)  * = pain  SHOULDER SPECIAL TESTS: performed on R shoulder  Impingement tests: Neer impingement test: positive  and Hawkins/Kennedy impingement test: positive  Rotator cuff assessment: Empty can test: positive , Full can test: positive , and External rotation lag sign: negative Biceps assessment: Speed's test: positive   JOINT MOBILITY TESTING:  WFL  PALPATION:  TTP R coracoid process and R supraspinatus tendon insertion (+) trigger point R supraspinatus   TODAY'S TREATMENT:                                                                                                                                         DATE: 08/28/22  Subjective:  Pt. Reports compliance with HEP (isometric ex.).  Pt. States pain is worse 1st thing in morning and has difficulty getting into a comfortable position.  Pt. Prefers to sleep on L side at night.    There.ex.:  Reviewed HEP (isometrics)- no issues.  PT progressed HEP and issued YTB for completion 4 days/ week.    PT educated pt. On the benefits of ice to R shoulder to manage inflammation/ pain after activity/ ex.  Manual tx.:  Seated/supine B shoulder AROM (all planes) to reassess ROM and discuss pain.  Supine R shoulder AP/inferior grade II-III mobs. 3x20 sec. (As tolerated).  Pt. Has pain with varying hand placement on R deltoid.  No pain in ulnar nerve distribution during ROM.  Good cervical AROM (unable to reproduce R UE/hand pain with neck).    STM to B UT/R posterior deltoid/bicep musculature in  seated/ supine position.  Moderate muscle tightness in R UT noted.     PATIENT EDUCATION: Education details: HEP, goals, POC  Person educated: Patient Education method: Explanation, Demonstration, and Handouts Education comprehension: verbalized understanding and returned demonstration  HOME EXERCISE PROGRAM: Access Code: [email or web address] URL: [email or web address] Date: 08/23/2022 Prepared by: Maylon Peppers  Exercises - Seated Scapular Retraction  - 2-3 x daily - 5-7 x weekly - 3 sets - 10 reps - Standing Isometric Shoulder Internal Rotation at Doorway  - 2 x daily - 5 x weekly - 3 sets - 10 reps - Standing Isometric Shoulder External Rotation with Doorway  - 2 x daily - 5 x weekly - 3 sets - 10 reps  Access Code: [email or web address] URL: [email or web address] Date: 08/28/2022 Prepared by: Dorene Grebe  Exercises - Shoulder External Rotation and Scapular Retraction with Resistance  - 1 x daily - 4 x weekly - 2 sets - 10 reps - Scapular Retraction with Resistance  - 1 x daily - 4 x weekly - 2 sets - 10 reps - Standing Shoulder Horizontal Abduction with Resistance  - 1 x daily - 4 x weekly - 2 sets - 10 reps - Standing Shoulder Diagonal Horizontal Abduction 60/120 Degrees  with Resistance  - 1 x daily - 4 x weekly - 2 sets - 10 reps  ASSESSMENT:  CLINICAL IMPRESSION: Pt. demonstrates impaired R shoulder ROM 2/2 pain, weakness, and impaired functional use of UE. (+) trigger points noted in supraspinatus with palpation.  PT progressed pts. HEP to focus more on strengthening sh./scapular musculature in a pain-free range.  Patient will benefit from skilled PT services to address listed impairments and improve UE use with decreased pain.   OBJECTIVE IMPAIRMENTS: decreased activity tolerance, decreased endurance, decreased ROM, decreased strength, increased muscle spasms, impaired UE functional use, postural dysfunction, and pain.   ACTIVITY LIMITATIONS: carrying, lifting,  sleeping, dressing, and reach over head  PARTICIPATION LIMITATIONS: laundry, community activity, and yard work  PERSONAL FACTORS: Age, Past/current experiences, and Time since onset of injury/illness/exacerbation are also affecting patient's functional outcome.   REHAB POTENTIAL: Good  CLINICAL DECISION MAKING: Stable/uncomplicated  EVALUATION COMPLEXITY: Low   GOALS: Goals reviewed with patient? Yes  SHORT TERM GOALS: Target date: 09/13/2022  Patient will be independent in HEP to improve strength/mobility for better functional independence with ADLs. Baseline: 7/24: see above Goal status: INITIAL  LONG TERM GOALS: Target date: 10/04/2022  Patient will increase FOTO score to equal to or greater than 66 to demonstrate statistically significant improvement in UE use and pain management Baseline: 7/24: 55 Goal status: INITIAL  2.  Patient will improve R shoulder AROM to symmetrical range in flexion and abduction to L shoulder with pain <2/10 for improved ability to perform overhead activities. Baseline: 7/24: see above Goal status: INITIAL  3.  Patient will increase B UE strength by 1/2 MMT grade to improve functional strength and ability to perform overhead activities and  Baseline: 7/24: see above Goal status: INITIAL  PLAN:  PT FREQUENCY: 1-2x/week  PT DURATION: 6 weeks  PLANNED INTERVENTIONS: Therapeutic exercises, Therapeutic activity, Neuromuscular re-education, Balance training, Gait training, Patient/Family education, Self Care, Joint mobilization, Dry Needling, Spinal mobilization, Cryotherapy, Moist heat, Manual therapy, and Re-evaluation  PLAN FOR NEXT SESSION: HEP review, manual therapy.  TDN to R UT.     Cammie Mcgee, PT, DPT # [phone] Physical Therapist - [facility]  08/28/2022, 11:56 AM

## 2022-08-30 ENCOUNTER — Ambulatory Visit: Payer: Medicare Other | Admitting: Physical Therapy

## 2022-08-30 DIAGNOSIS — M6281 Muscle weakness (generalized): Secondary | ICD-10-CM

## 2022-08-30 DIAGNOSIS — M25511 Pain in right shoulder: Secondary | ICD-10-CM | POA: Diagnosis not present

## 2022-08-30 NOTE — Therapy (Signed)
OUTPATIENT PHYSICAL THERAPY SHOULDER TREATMENT  Patient Name: Jordan Palmer MRN: [id number] DOB:[birth year], 72 y.o., female Today's Date: 08/30/2022  END OF SESSION:  PT End of Session - 08/30/22 1304     Visit Number 3    Number of Visits 13    Date for PT Re-Evaluation 10/04/22    PT Start Time 0945    PT Stop Time 1032    PT Time Calculation (min) 47 min    Activity Tolerance Patient tolerated treatment well    Behavior During Therapy WFL for tasks assessed/performed             Past Medical History:  Diagnosis Date   Arthritis    oa knees   Cystocele with prolapse    Hyperlipemia    Hypertension    Hypothyroidism    Sleep apnea    cpap set on 25   SUI (stress urinary incontinence, female)    Wears contact lenses    Past Surgical History:  Procedure Laterality Date   ABDOMINAL HYSTERECTOMY     ANTERIOR AND POSTERIOR REPAIR N/A 03/12/2017   Procedure: ANTERIOR (CYSTOCELE) AND POSTERIOR REPAIR (RECTOCELE);  Surgeon: Romualdo Bolk, MD;  Location: [facility];  Service: Gynecology;  Laterality: N/A;   BLADDER SUSPENSION N/A 05/03/2020   Procedure: TRANSVAGINAL TAPE (TVT) PROCEDURE;  Surgeon: Marguerita Beards, MD;  Location: [facility];  Service: Gynecology;  Laterality: N/A;   CARPAL TUNNEL RELEASE Bilateral yrs ago   CHOLECYSTECTOMY  2001   laparoscopic   CYSTOCELE REPAIR N/A 05/03/2020   Procedure: ANTERIOR REPAIR (CYSTOCELE);  Surgeon: Marguerita Beards, MD;  Location: [facility];  Service: Gynecology;  Laterality: N/A;  total time needed for 3 procedures is 1.5hrs   CYSTOSCOPY N/A 03/12/2017   Procedure: CYSTOSCOPY;  Surgeon: Romualdo Bolk, MD;  Location: [facility];  Service: Gynecology;  Laterality: N/A;   CYSTOSCOPY N/A 05/03/2020   Procedure: CYSTOSCOPY;  Surgeon: Marguerita Beards, MD;  Location: [facility];  Service: Gynecology;  Laterality: N/A;   KNEE  ARTHROSCOPY Left 12/2019   KNEE ARTHROSCOPY WITH MEDIAL MENISECTOMY Right 02/13/2019   Procedure: KNEE ARTHROSCOPY WITH MEDIAL MENISECTOMY;  Surgeon: Juanell Fairly, MD;  Location: [facility];  Service: Orthopedics;  Laterality: Right;   LACRIMAL DUCT PROBING W/ DACRYOPLASTY Bilateral    SALPINGOOPHORECTOMY Left 03/12/2017   Procedure: SALPINGECTOMY;  Surgeon: Romualdo Bolk, MD;  Location: [facility];  Service: Gynecology;  Laterality: Left;   SHOULDER ARTHROSCOPY WITH OPEN ROTATOR CUFF REPAIR Right 09/03/2014   Procedure: SHOULDER ARTHROSCOPY WITH OPEN ROTATOR CUFF REPAIR;  Surgeon: Juanell Fairly, MD;  Location: [facility];  Service: Orthopedics;  Laterality: Right;   THYROIDECTOMY Right yrs ago   hemi-thyroidectomy   TOTAL KNEE ARTHROPLASTY Right 05/20/2019   Procedure: TOTAL KNEE ARTHROPLASTY;  Surgeon: Juanell Fairly, MD;  Location: [facility];  Service: Orthopedics;  Laterality: Right;   TOTAL KNEE ARTHROPLASTY Left 12/23/2019   Procedure: Left TOTAL KNEE ARTHROPLASTY;  Surgeon: Juanell Fairly, MD;  Location: [facility];  Service: Orthopedics;  Laterality: Left;   VAGINAL HYSTERECTOMY N/A 03/12/2017   Procedure: HYSTERECTOMY VAGINAL;  Surgeon: Romualdo Bolk, MD;  Location: [facility];  Service: Gynecology;  Laterality: N/A;   Patient Active Problem List   Diagnosis Date Noted   S/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, left 12/23/2019   S/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, right 05/20/2019   H/O total vaginal hysterectomy  03/12/2017   Essential hypertension 03/08/2017   Mixed dyslipidemia 03/08/2017   Abnormal EKG 03/08/2017   Preoperative cardiovascular examination 03/08/2017   Allergic state 12/11/2013   High cholesterol 12/11/2013   History of depression 12/11/2013   Hypothyroidism 12/11/2013   Sleep apnea 12/11/2013    PCP: Gracelyn Nurse, MD  REFERRING PROVIDER: Juanell Fairly, MD  REFERRING DIAG: M75.41  (ICD-10-CM) - Impingement syndrome of right shoulder  THERAPY DIAG:  Right shoulder pain, unspecified chronicity  Muscle weakness (generalized)  Rationale for Evaluation and Treatment: Rehabilitation  ONSET DATE: > 6 months   SUBJECTIVE:                                                                                                                                                                                      SUBJECTIVE STATEMENT: Began at least 6 months ago but has worsened recently. Had shoulder arthroscopy with open rotator cuff repair in 2016 by Dr. Nyoka Lint. Patient reports pain into R hand (pinky and ring finger). Patient reports pain at rest and reaching overhead. Patient has difficulty holding heavy objects (>5 lbs). Medication (ibuprofen primarily) helps mediate pain.  Unknown MOI. Feels as though it may have something to do with previous surgery on R shoulder years ago.   Hand dominance: Right  PERTINENT HISTORY: S/p R shoulder arthroscopy with open rotator cuff repair in 08/2014  PAIN:  Are you having pain? Yes: NPRS scale: 4-5/10 Pain location: R shoulder Pain description: "painful" Aggravating factors: rest, reaching overhead  Relieving factors: medication  PRECAUTIONS: None  RED FLAGS: None   WEIGHT BEARING RESTRICTIONS: No  FALLS:  Has patient fallen in last 6 months? 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      SENSATION: WFL  POSTURE: Rounded shoulders, forward head posture   UPPER EXTREMITY ROM:   Active ROM  Right eval Left eval  Shoulder flexion 120 140  Shoulder extension    Shoulder abduction 118 138  Shoulder adduction    Shoulder internal rotation    Shoulder external rotation    (Blank rows = not tested)  UPPER EXTREMITY MMT:  MMT Right eval Left eval  Shoulder flexion 4- 4+  Shoulder extension    Shoulder abduction 4- 4  Shoulder adduction    Shoulder internal rotation 4 4  Shoulder external rotation 4 4  (Blank rows = not tested)  * = pain  SHOULDER SPECIAL TESTS: performed on R shoulder  Impingement tests: Neer impingement test: positive  and Hawkins/Kennedy impingement test: positive  Rotator cuff assessment: Empty can test: positive , Full can test: positive , and External rotation lag sign: negative Biceps assessment: Speed's test: positive   JOINT MOBILITY TESTING:  WFL  PALPATION:  TTP R coracoid process and R supraspinatus tendon insertion (+) trigger point R supraspinatus   TODAY'S TREATMENT:                                                                                                                                         DATE: 08/30/22  Subjective:  Pt. Reports compliance with new HEP (banded exercises) with no adverse responses. She feels "about the same" as last session, with 2-3/10 R shoulder pain/soreness.  There.ex.:  Reviewed HEP (banded exercises)- no issues, good recall and form demonstrated.   Standing ER wall slides (2x10) with YTB, deferred to straight arm banded shoulder flexion secondary to mild neck pain; 90-90 ball tosses at wall (2x25 with small blue ball); Lateral raises (2x15, 2# weight); Shoulder extensions on Nautilus (1x15, 1x10, 40#).   PT educated pt. On the benefits of ice to R shoulder to manage inflammation/ pain after activity/ ex.  Manual tx.: No charge  STM to R UT with ice pack applied x 5 mins - pt reports 2/10 pain at end of session   PATIENT EDUCATION: Education details: HEP, goals, POC  Person educated:  Patient Education method: Explanation, Demonstration, and Handouts Education comprehension: verbalized understanding and returned demonstration  HOME EXERCISE PROGRAM: Access Code: [email or web address] URL: [email or web address] Date: 08/23/2022 Prepared by: Maylon Peppers  Exercises - Seated Scapular Retraction  - 2-3 x daily - 5-7 x weekly - 3 sets - 10 reps - Standing Isometric Shoulder Internal Rotation at Doorway  - 2 x daily - 5 x weekly - 3 sets - 10 reps - Standing Isometric Shoulder External Rotation with Doorway  - 2 x daily - 5 x weekly - 3 sets - 10 reps  Access Code: [email or web address] URL: [email or web address] Date: 08/28/2022 Prepared by: Dorene Grebe  Exercises - Shoulder External Rotation and Scapular Retraction with Resistance  - 1 x daily - 4 x weekly - 2 sets - 10 reps - Scapular Retraction with Resistance  - 1 x daily - 4 x weekly - 2 sets - 10 reps - Standing Shoulder Horizontal Abduction with Resistance  - 1 x daily - 4 x weekly - 2 sets - 10 reps - Standing Shoulder Diagonal Horizontal Abduction 60/120 Degrees with Resistance  - 1 x daily - 4 x weekly - 2 sets - 10 reps  ASSESSMENT:  CLINICAL IMPRESSION: Pt. demonstrates good recall/form with review of new HEP.  Exercises today  done to improve strength and endurance of sh./scapular musculature.  No change to HEP today.   Occasional cueing required in order to keep exercises in a pain-free range. Patient will benefit from skilled PT services to address listed impairments and improve UE use with decreased pain.   OBJECTIVE IMPAIRMENTS: decreased activity tolerance, decreased endurance, decreased ROM, decreased strength, increased muscle spasms, impaired UE functional use, postural dysfunction, and pain.   ACTIVITY LIMITATIONS: carrying, lifting, sleeping, dressing, and reach over head  PARTICIPATION LIMITATIONS: laundry, community activity, and yard work  PERSONAL FACTORS: Age, Past/current  experiences, and Time since onset of injury/illness/exacerbation are also affecting patient's functional outcome.   REHAB POTENTIAL: Good  CLINICAL DECISION MAKING: Stable/uncomplicated  EVALUATION COMPLEXITY: Low   GOALS: Goals reviewed with patient? Yes  SHORT TERM GOALS: Target date: 09/13/2022  Patient will be independent in HEP to improve strength/mobility for better functional independence with ADLs. Baseline: 7/24: see above Goal status: INITIAL  LONG TERM GOALS: Target date: 10/04/2022  Patient will increase FOTO score to equal to or greater than 66 to demonstrate statistically significant improvement in UE use and pain management Baseline: 7/24: 55 Goal status: INITIAL  2.  Patient will improve R shoulder AROM to symmetrical range in flexion and abduction to L shoulder with pain <2/10 for improved ability to perform overhead activities. Baseline: 7/24: see above Goal status: INITIAL  3.  Patient will increase B UE strength by 1/2 MMT grade to improve functional strength and ability to perform overhead activities and  Baseline: 7/24: see above Goal status: INITIAL  PLAN:  PT FREQUENCY: 1-2x/week  PT DURATION: 6 weeks  PLANNED INTERVENTIONS: Therapeutic exercises, Therapeutic activity, Neuromuscular re-education, Balance training, Gait training, Patient/Family education, Self Care, Joint mobilization, Dry Needling, Spinal mobilization, Cryotherapy, Moist heat, Manual therapy, and Re-evaluation  PLAN FOR NEXT SESSION: Progress shoulder/scapular strengthening exercises as tolerated, manual therapy. TDN to R UT.    TJ Crutchfield, SPT Cammie Mcgee, PT, DPT # [phone] Physical Therapist - [facility]  08/30/2022, 1:06 PM

## 2022-09-04 ENCOUNTER — Encounter: Payer: Self-pay | Admitting: Physical Therapy

## 2022-09-04 ENCOUNTER — Ambulatory Visit: Payer: Medicare Other | Attending: Orthopedic Surgery | Admitting: Physical Therapy

## 2022-09-04 DIAGNOSIS — M6281 Muscle weakness (generalized): Secondary | ICD-10-CM | POA: Insufficient documentation

## 2022-09-04 DIAGNOSIS — M25511 Pain in right shoulder: Secondary | ICD-10-CM | POA: Insufficient documentation

## 2022-09-04 NOTE — Therapy (Signed)
OUTPATIENT PHYSICAL THERAPY SHOULDER TREATMENT  Patient Name: Jordan Palmer MRN: [id number] DOB:05-19-1950, 72 y.o., female Today's Date: 09/04/2022  END OF SESSION:  PT End of Session - 09/04/22 0740     Visit Number 4    Number of Visits 13    Date for PT Re-Evaluation 10/04/22    PT Start Time 0732    PT Stop Time 0818    PT Time Calculation (min) 46 min    Activity Tolerance Patient tolerated treatment well    Behavior During Therapy WFL for tasks assessed/performed             Past Medical History:  Diagnosis Date   Arthritis    oa knees   Cystocele with prolapse    Hyperlipemia    Hypertension    Hypothyroidism    Sleep apnea    cpap set on 25   SUI (stress urinary incontinence, female)    Wears contact lenses    Past Surgical History:  Procedure Laterality Date   ABDOMINAL HYSTERECTOMY     ANTERIOR AND POSTERIOR REPAIR N/A 03/12/2017   Procedure: ANTERIOR (CYSTOCELE) AND POSTERIOR REPAIR (RECTOCELE);  Surgeon: Romualdo Bolk, MD;  Location: [facility];  Service: Gynecology;  Laterality: N/A;   BLADDER SUSPENSION N/A 05/03/2020   Procedure: TRANSVAGINAL TAPE (TVT) PROCEDURE;  Surgeon: Marguerita Beards, MD;  Location: [facility];  Service: Gynecology;  Laterality: N/A;   CARPAL TUNNEL RELEASE Bilateral yrs ago   CHOLECYSTECTOMY  2001   laparoscopic   CYSTOCELE REPAIR N/A 05/03/2020   Procedure: ANTERIOR REPAIR (CYSTOCELE);  Surgeon: Marguerita Beards, MD;  Location: [facility];  Service: Gynecology;  Laterality: N/A;  total time needed for 3 procedures is 1.5hrs   CYSTOSCOPY N/A 03/12/2017   Procedure: CYSTOSCOPY;  Surgeon: Romualdo Bolk, MD;  Location: [facility];  Service: Gynecology;  Laterality: N/A;   CYSTOSCOPY N/A 05/03/2020   Procedure: CYSTOSCOPY;  Surgeon: Marguerita Beards, MD;  Location: [facility];  Service: Gynecology;  Laterality: N/A;   KNEE  ARTHROSCOPY Left 12/2019   KNEE ARTHROSCOPY WITH MEDIAL MENISECTOMY Right 02/13/2019   Procedure: KNEE ARTHROSCOPY WITH MEDIAL MENISECTOMY;  Surgeon: Juanell Fairly, MD;  Location: [facility];  Service: Orthopedics;  Laterality: Right;   LACRIMAL DUCT PROBING W/ DACRYOPLASTY Bilateral    SALPINGOOPHORECTOMY Left 03/12/2017   Procedure: SALPINGECTOMY;  Surgeon: Romualdo Bolk, MD;  Location: [facility];  Service: Gynecology;  Laterality: Left;   SHOULDER ARTHROSCOPY WITH OPEN ROTATOR CUFF REPAIR Right 09/03/2014   Procedure: SHOULDER ARTHROSCOPY WITH OPEN ROTATOR CUFF REPAIR;  Surgeon: Juanell Fairly, MD;  Location: [facility];  Service: Orthopedics;  Laterality: Right;   THYROIDECTOMY Right yrs ago   hemi-thyroidectomy   TOTAL KNEE ARTHROPLASTY Right 05/20/2019   Procedure: TOTAL KNEE ARTHROPLASTY;  Surgeon: Juanell Fairly, MD;  Location: [facility];  Service: Orthopedics;  Laterality: Right;   TOTAL KNEE ARTHROPLASTY Left 12/23/2019   Procedure: Left TOTAL KNEE ARTHROPLASTY;  Surgeon: Juanell Fairly, MD;  Location: [facility];  Service: Orthopedics;  Laterality: Left;   VAGINAL HYSTERECTOMY N/A 03/12/2017   Procedure: HYSTERECTOMY VAGINAL;  Surgeon: Romualdo Bolk, MD;  Location: [facility];  Service: Gynecology;  Laterality: N/A;   Patient Active Problem List   Diagnosis Date Noted   S/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, left 12/23/2019   S/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, right 05/20/2019   H/O total vaginal hysterectomy  03/12/2017   Essential hypertension 03/08/2017   Mixed dyslipidemia 03/08/2017   Abnormal EKG 03/08/2017   Preoperative cardiovascular examination 03/08/2017   Allergic state 12/11/2013   High cholesterol 12/11/2013   History of depression 12/11/2013   Hypothyroidism 12/11/2013   Sleep apnea 12/11/2013    PCP: Gracelyn Nurse, MD  REFERRING PROVIDER: Juanell Fairly, MD  REFERRING DIAG: M75.41  (ICD-10-CM) - Impingement syndrome of right shoulder  THERAPY DIAG:  Right shoulder pain, unspecified chronicity  Muscle weakness (generalized)  Rationale for Evaluation and Treatment: Rehabilitation  ONSET DATE: > 6 months   SUBJECTIVE:                                                                                                                                                                                      SUBJECTIVE STATEMENT: Began at least 6 months ago but has worsened recently. Had shoulder arthroscopy with open rotator cuff repair in 2016 by Dr. Nyoka Lint. Patient reports pain into R hand (pinky and ring finger). Patient reports pain at rest and reaching overhead. Patient has difficulty holding heavy objects (>5 lbs). Medication (ibuprofen primarily) helps mediate pain.  Unknown MOI. Feels as though it may have something to do with previous surgery on R shoulder years ago.   Hand dominance: Right  PERTINENT HISTORY: S/p R shoulder arthroscopy with open rotator cuff repair in 08/2014  PAIN:  Are you having pain? Yes: NPRS scale: 4-5/10 Pain location: R shoulder Pain description: "painful" Aggravating factors: rest, reaching overhead  Relieving factors: medication  PRECAUTIONS: None  RED FLAGS: None   WEIGHT BEARING RESTRICTIONS: No  FALLS:  Has patient fallen in last 6 months? 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      SENSATION: WFL  POSTURE: Rounded shoulders, forward head posture   UPPER EXTREMITY ROM:   Active ROM  Right eval Left eval  Shoulder flexion 120 140  Shoulder extension    Shoulder abduction 118 138  Shoulder adduction    Shoulder internal rotation    Shoulder external rotation    (Blank rows = not tested)  UPPER EXTREMITY MMT:  MMT Right eval Left eval  Shoulder flexion 4- 4+  Shoulder extension    Shoulder abduction 4- 4  Shoulder adduction    Shoulder internal rotation 4 4  Shoulder external rotation 4 4  (Blank rows = not tested)  * = pain  SHOULDER SPECIAL TESTS: performed on R shoulder  Impingement tests: Neer impingement test: positive  and Hawkins/Kennedy impingement test: positive  Rotator cuff assessment: Empty can test: positive , Full can test: positive , and External rotation lag sign: negative Biceps assessment: Speed's test: positive   JOINT MOBILITY TESTING:  WFL  PALPATION:  TTP R coracoid process and R supraspinatus tendon insertion (+) trigger point R supraspinatus   TODAY'S TREATMENT:                                                                                                                                         DATE: 09/04/22  Subjective:  Pt. 2/10 R shoulder "soreness"/ discomfort prior to PT session.  Pt. States she feels good in R shoulder after there.ex./HEP but soreness returns after a few hours.  PT encouraged pt. To ice shoulder after ex. To prevent muscle soreness. Pt reports still taking ibuprofen prior to sessions to help with pain.  There.ex.:  Seated B UBE: 2.5 min. F/b.  Consistent cadence/ discussed weekend activities.    Nautilus with wand: Shoulder extension 40# 1x15, 50# 10x; standing lat. Pull downs 40# 15x2; scap. Retraction 40# 15x2; chest press 20# 2x15; tricep extension 40# 2x15;  Supine rhythmic stabilizations to R shoulder: 2 x 30 seconds, 2 x 1 minute  PT educated pt. On the benefits of ice to R shoulder to manage inflammation/ pain after activity/ ex.  Manual tx.: No charge  Supine R UT stretch with light STM  3x30 second holds   PATIENT EDUCATION: Education details: HEP, goals, POC  Person educated: Patient Education method: Explanation, Demonstration, and Handouts Education comprehension: verbalized understanding and returned demonstration  HOME EXERCISE PROGRAM: Access Code: [email or web address] URL: [email or web address] Date: 08/23/2022 Prepared by: Maylon Peppers  Exercises - Seated Scapular Retraction  - 2-3 x daily - 5-7 x weekly - 3 sets - 10 reps - Standing Isometric Shoulder Internal Rotation at Doorway  - 2 x daily - 5 x weekly - 3 sets - 10 reps - Standing Isometric Shoulder External Rotation with Doorway  - 2 x daily - 5 x weekly - 3 sets - 10 reps  Access Code: [email or web address] URL: [email or web address] Date: 08/28/2022 Prepared by: Dorene Grebe  Exercises - Shoulder External Rotation and Scapular Retraction with Resistance  - 1 x daily - 4 x weekly - 2 sets - 10 reps - Scapular Retraction with Resistance  - 1 x daily - 4 x weekly - 2 sets - 10 reps - Standing Shoulder Horizontal Abduction with Resistance  - 1 x daily - 4 x weekly - 2 sets - 10 reps - Standing Shoulder Diagonal Horizontal Abduction 60/120 Degrees with Resistance  - 1 x daily - 4 x weekly - 2 sets -  10 reps  ASSESSMENT:  CLINICAL IMPRESSION: Pt reports to PT with usual 2/10 R shoulder pain/soreness. Today's session consisted mainly of shoulder/periscapular strengthening; cueing throughout for proper form on nautilus machine and for normal breathing patterns. R UT stretching with light STM performed at end of session to improve tissue extensibility.  Decrease R UT trigger point today as compared to last tx. Session.   Patient will benefit from skilled PT services to address listed impairments and improve UE use with decreased pain.   OBJECTIVE IMPAIRMENTS: decreased activity tolerance, decreased endurance, decreased ROM, decreased strength, increased muscle spasms, impaired UE functional use,  postural dysfunction, and pain.   ACTIVITY LIMITATIONS: carrying, lifting, sleeping, dressing, and reach over head  PARTICIPATION LIMITATIONS: laundry, community activity, and yard work  PERSONAL FACTORS: Age, Past/current experiences, and Time since onset of injury/illness/exacerbation are also affecting patient's functional outcome.   REHAB POTENTIAL: Good  CLINICAL DECISION MAKING: Stable/uncomplicated  EVALUATION COMPLEXITY: Low   GOALS: Goals reviewed with patient? Yes  SHORT TERM GOALS: Target date: 09/13/2022  Patient will be independent in HEP to improve strength/mobility for better functional independence with ADLs. Baseline: 7/24: see above Goal status: INITIAL  LONG TERM GOALS: Target date: 10/04/2022  Patient will increase FOTO score to equal to or greater than 66 to demonstrate statistically significant improvement in UE use and pain management Baseline: 7/24: 55 Goal status: INITIAL  2.  Patient will improve R shoulder AROM to symmetrical range in flexion and abduction to L shoulder with pain <2/10 for improved ability to perform overhead activities. Baseline: 7/24: see above Goal status: INITIAL  3.  Patient will increase B UE strength by 1/2 MMT grade to improve functional strength and ability to perform overhead activities and  Baseline: 7/24: see above Goal status: INITIAL  PLAN:  PT FREQUENCY: 1-2x/week  PT DURATION: 6 weeks  PLANNED INTERVENTIONS: Therapeutic exercises, Therapeutic activity, Neuromuscular re-education, Balance training, Gait training, Patient/Family education, Self Care, Joint mobilization, Dry Needling, Spinal mobilization, Cryotherapy, Moist heat, Manual therapy, and Re-evaluation  PLAN FOR NEXT SESSION: Progress shoulder/scapular strengthening exercises as tolerated, manual therapy. TDN to R UT.    TJ Crutchfield, SPT Cammie Mcgee, PT, DPT # [phone] Physical Therapist - [facility]  09/04/2022,  8:30 AM

## 2022-09-06 ENCOUNTER — Encounter: Payer: Self-pay | Admitting: Physical Therapy

## 2022-09-06 ENCOUNTER — Ambulatory Visit: Payer: Medicare Other | Admitting: Physical Therapy

## 2022-09-06 DIAGNOSIS — M6281 Muscle weakness (generalized): Secondary | ICD-10-CM

## 2022-09-06 DIAGNOSIS — M25511 Pain in right shoulder: Secondary | ICD-10-CM

## 2022-09-06 NOTE — Therapy (Signed)
OUTPATIENT PHYSICAL THERAPY SHOULDER TREATMENT  Patient Name: Jordan Palmer MRN: [id number] DOB:11-13-1950, 72 y.o., female Today's Date: 09/06/2022  END OF SESSION:  PT End of Session - 09/06/22 0858     Visit Number 5    Number of Visits 13    Date for PT Re-Evaluation 10/04/22    PT Start Time 0858    PT Stop Time 0942    PT Time Calculation (min) 44 min    Activity Tolerance Patient tolerated treatment well    Behavior During Therapy [facility] for tasks assessed/performed             Past Medical History:  Diagnosis Date   Arthritis    oa knees   Cystocele with prolapse    Hyperlipemia    Hypertension    Hypothyroidism    Sleep apnea    cpap set on 25   SUI (stress urinary incontinence, female)    Wears contact lenses    Past Surgical History:  Procedure Laterality Date   ABDOMINAL HYSTERECTOMY     ANTERIOR AND POSTERIOR REPAIR N/A 03/12/2017   Procedure: ANTERIOR (CYSTOCELE) AND POSTERIOR REPAIR (RECTOCELE);  Surgeon: Romualdo Bolk, MD;  Location: [facility];  Service: Gynecology;  Laterality: N/A;   BLADDER SUSPENSION N/A 05/03/2020   Procedure: TRANSVAGINAL TAPE (TVT) PROCEDURE;  Surgeon: Marguerita Beards, MD;  Location: [facility];  Service: Gynecology;  Laterality: N/A;   CARPAL TUNNEL RELEASE Bilateral yrs ago   CHOLECYSTECTOMY  2001   laparoscopic   CYSTOCELE REPAIR N/A 05/03/2020   Procedure: ANTERIOR REPAIR (CYSTOCELE);  Surgeon: Marguerita Beards, MD;  Location: [facility];  Service: Gynecology;  Laterality: N/A;  total time needed for 3 procedures is 1.5hrs   CYSTOSCOPY N/A 03/12/2017   Procedure: CYSTOSCOPY;  Surgeon: Romualdo Bolk, MD;  Location: [facility];  Service: Gynecology;  Laterality: N/A;   CYSTOSCOPY N/A 05/03/2020   Procedure: CYSTOSCOPY;  Surgeon: Marguerita Beards, MD;  Location: [facility];  Service: Gynecology;  Laterality: N/A;   KNEE  ARTHROSCOPY Left 12/2019   KNEE ARTHROSCOPY WITH MEDIAL MENISECTOMY Right 02/13/2019   Procedure: KNEE ARTHROSCOPY WITH MEDIAL MENISECTOMY;  Surgeon: Juanell Fairly, MD;  Location: [facility];  Service: Orthopedics;  Laterality: Right;   LACRIMAL DUCT PROBING W/ DACRYOPLASTY Bilateral    SALPINGOOPHORECTOMY Left 03/12/2017   Procedure: SALPINGECTOMY;  Surgeon: Romualdo Bolk, MD;  Location: [facility];  Service: Gynecology;  Laterality: Left;   SHOULDER ARTHROSCOPY WITH OPEN ROTATOR CUFF REPAIR Right 09/03/2014   Procedure: SHOULDER ARTHROSCOPY WITH OPEN ROTATOR CUFF REPAIR;  Surgeon: Juanell Fairly, MD;  Location: [facility];  Service: Orthopedics;  Laterality: Right;   THYROIDECTOMY Right yrs ago   hemi-thyroidectomy   TOTAL KNEE ARTHROPLASTY Right 05/20/2019   Procedure: TOTAL KNEE ARTHROPLASTY;  Surgeon: Juanell Fairly, MD;  Location: [facility];  Service: Orthopedics;  Laterality: Right;   TOTAL KNEE ARTHROPLASTY Left 12/23/2019   Procedure: Left TOTAL KNEE ARTHROPLASTY;  Surgeon: Juanell Fairly, MD;  Location: [facility];  Service: Orthopedics;  Laterality: Left;   VAGINAL HYSTERECTOMY N/A 03/12/2017   Procedure: HYSTERECTOMY VAGINAL;  Surgeon: Romualdo Bolk, MD;  Location: [facility];  Service: Gynecology;  Laterality: N/A;   Patient Active Problem List   Diagnosis Date Noted   S/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, left 12/23/2019   S/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, right 05/20/2019   H/O total vaginal hysterectomy  03/12/2017   Essential hypertension 03/08/2017   Mixed dyslipidemia 03/08/2017   Abnormal EKG 03/08/2017   Preoperative cardiovascular examination 03/08/2017   Allergic state 12/11/2013   High cholesterol 12/11/2013   History of depression 12/11/2013   Hypothyroidism 12/11/2013   Sleep apnea 12/11/2013    PCP: Gracelyn Nurse, MD  REFERRING PROVIDER: Juanell Fairly, MD  REFERRING DIAG: M75.41  (ICD-10-CM) - Impingement syndrome of right shoulder  THERAPY DIAG:  Right shoulder pain, unspecified chronicity  Muscle weakness (generalized)  Rationale for Evaluation and Treatment: Rehabilitation  ONSET DATE: > 6 months   SUBJECTIVE:                                                                                                                                                                                      SUBJECTIVE STATEMENT: Began at least 6 months ago but has worsened recently. Had shoulder arthroscopy with open rotator cuff repair in 2016 by Dr. Nyoka Lint. Patient reports pain into R hand (pinky and ring finger). Patient reports pain at rest and reaching overhead. Patient has difficulty holding heavy objects (>5 lbs). Medication (ibuprofen primarily) helps mediate pain.  Unknown MOI. Feels as though it may have something to do with previous surgery on R shoulder years ago.   Hand dominance: Right  PERTINENT HISTORY: S/p R shoulder arthroscopy with open rotator cuff repair in 08/2014  PAIN:  Are you having pain? Yes: NPRS scale: 4-5/10 Pain location: R shoulder Pain description: "painful" Aggravating factors: rest, reaching overhead  Relieving factors: medication  PRECAUTIONS: None  RED FLAGS: None   WEIGHT BEARING RESTRICTIONS: No  FALLS:  Has patient fallen in last 6 months? 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      SENSATION: WFL  POSTURE: Rounded shoulders, forward head posture   UPPER EXTREMITY ROM:   Active ROM  Right eval Left eval  Shoulder flexion 120 140  Shoulder extension    Shoulder abduction 118 138  Shoulder adduction    Shoulder internal rotation    Shoulder external rotation    (Blank rows = not tested)  UPPER EXTREMITY MMT:  MMT Right eval Left eval  Shoulder flexion 4- 4+  Shoulder extension    Shoulder abduction 4- 4  Shoulder adduction    Shoulder internal rotation 4 4  Shoulder external rotation 4 4  (Blank rows = not tested)  * = pain  SHOULDER SPECIAL TESTS: performed on R shoulder  Impingement tests: Neer impingement test: positive  and Hawkins/Kennedy impingement test: positive  Rotator cuff assessment: Empty can test: positive , Full can test: positive , and External rotation lag sign: negative Biceps assessment: Speed's test: positive   JOINT MOBILITY TESTING:  WFL  PALPATION:  TTP R coracoid process and R supraspinatus tendon insertion (+) trigger point R supraspinatus   TODAY'S TREATMENT:                                                                                                                                         DATE: 09/06/22  Subjective:  Pt. 2/10 R shoulder "soreness"/ discomfort prior to PT session.  Pt. States she feels good in R shoulder after there.ex./HEP but soreness returns after a few hours.  PT encouraged pt. To ice shoulder after ex. To prevent muscle soreness. Pt reports still taking ibuprofen prior to sessions to help with pain.  There.ex.:  Seated B UBE: 3 min. F/b.  Consistent cadence/ discussed weekend activities.    Nautilus with wand: Seated lat. Pull downs 50# 15x2; scap. Retraction 50# 15x2; chest press 20# 2x15; tricep extension 40# 2x15;  Seated R shoulder AA/PROM (flexion/ abduction/ IR)- 10x (pain tolerable range).    PT educated pt. On the benefits of ice to R shoulder to manage inflammation/ pain after activity/ ex.  Manual tx.: 11 min.   Seated R UT stretch with light STM to UT/posterior deltoid/ scapular  musculature.     PATIENT EDUCATION: Education details: HEP, goals, POC  Person educated: Patient Education method: Explanation, Demonstration, and Handouts Education comprehension: verbalized understanding and returned demonstration  HOME EXERCISE PROGRAM: Access Code: [email or web address] URL: [email or web address] Date: 08/23/2022 Prepared by: Maylon Peppers  Exercises - Seated Scapular Retraction  - 2-3 x daily - 5-7 x weekly - 3 sets - 10 reps - Standing Isometric Shoulder Internal Rotation at Doorway  - 2 x daily - 5 x weekly - 3 sets - 10 reps - Standing Isometric Shoulder External Rotation with Doorway  - 2 x daily - 5 x weekly - 3 sets - 10 reps  Access Code: [email or web address] URL: [email or web address] Date: 08/28/2022 Prepared by: Dorene Grebe  Exercises - Shoulder External Rotation and Scapular Retraction with Resistance  - 1 x daily - 4 x weekly - 2 sets - 10 reps - Scapular Retraction with Resistance  - 1 x daily - 4 x weekly - 2 sets - 10 reps - Standing Shoulder Horizontal Abduction with Resistance  - 1 x daily - 4 x weekly - 2 sets - 10 reps - Standing Shoulder Diagonal Horizontal Abduction 60/120 Degrees with Resistance  - 1 x daily - 4 x weekly - 2 sets - 10  reps  ASSESSMENT:  CLINICAL IMPRESSION: Pt reports to PT with usual 2/10 R shoulder pain/soreness. Today's session consisted of shoulder/periscapular strengthening; cueing throughout for proper form on Nautilus  and for normal breathing patterns. R UT stretching with light STM performed at end of session to improve tissue extensibility.  Pt. Benefit from use of ice to R UT/shoulder musculature after tx. To manage soreness/ inflammation.  Pt. Instructed to ice more frequently at home.   Patient will benefit from skilled PT services to address listed impairments and improve UE use with decreased pain.   OBJECTIVE IMPAIRMENTS: decreased activity tolerance, decreased endurance, decreased ROM, decreased  strength, increased muscle spasms, impaired UE functional use, postural dysfunction, and pain.   ACTIVITY LIMITATIONS: carrying, lifting, sleeping, dressing, and reach over head  PARTICIPATION LIMITATIONS: laundry, community activity, and yard work  PERSONAL FACTORS: Age, Past/current experiences, and Time since onset of injury/illness/exacerbation are also affecting patient's functional outcome.   REHAB POTENTIAL: Good  CLINICAL DECISION MAKING: Stable/uncomplicated  EVALUATION COMPLEXITY: Low   GOALS: Goals reviewed with patient? Yes  SHORT TERM GOALS: Target date: 09/13/2022  Patient will be independent in HEP to improve strength/mobility for better functional independence with ADLs. Baseline: 7/24: see above Goal status: Goal met  LONG TERM GOALS: Target date: 10/04/2022  Patient will increase FOTO score to equal to or greater than 66 to demonstrate statistically significant improvement in UE use and pain management Baseline: 7/24: 55 Goal status: INITIAL  2.  Patient will improve R shoulder AROM to symmetrical range in flexion and abduction to L shoulder with pain <2/10 for improved ability to perform overhead activities. Baseline: 7/24: see above Goal status: INITIAL  3.  Patient will increase B UE strength by 1/2 MMT grade to improve functional strength and ability to perform overhead activities and  Baseline: 7/24: see above Goal status: INITIAL  PLAN:  PT FREQUENCY: 1-2x/week  PT DURATION: 6 weeks  PLANNED INTERVENTIONS: Therapeutic exercises, Therapeutic activity, Neuromuscular re-education, Balance training, Gait training, Patient/Family education, Self Care, Joint mobilization, Dry Needling, Spinal mobilization, Cryotherapy, Moist heat, Manual therapy, and Re-evaluation  PLAN FOR NEXT SESSION: Progress shoulder/scapular strengthening exercises as tolerated, manual therapy.      Cammie Mcgee, PT, DPT # [phone] Physical Therapist - [facility]  09/06/2022, 9:42 AM

## 2022-09-11 ENCOUNTER — Encounter: Payer: Self-pay | Admitting: Physical Therapy

## 2022-09-11 ENCOUNTER — Ambulatory Visit: Payer: Medicare Other | Admitting: Physical Therapy

## 2022-09-11 DIAGNOSIS — M25511 Pain in right shoulder: Secondary | ICD-10-CM

## 2022-09-11 DIAGNOSIS — M6281 Muscle weakness (generalized): Secondary | ICD-10-CM

## 2022-09-11 NOTE — Therapy (Signed)
OUTPATIENT PHYSICAL THERAPY SHOULDER TREATMENT  Patient Name: Jordan Palmer MRN: [id number] DOB:Apr 08, 1950, 72 y.o., female Today's Date: 09/11/2022  END OF SESSION:  PT End of Session - 09/11/22 1214     Visit Number 6    Number of Visits 13    Date for PT Re-Evaluation 10/04/22    PT Start Time 0730    PT Stop Time 0815    PT Time Calculation (min) 45 min    Activity Tolerance Patient tolerated treatment well    Behavior During Therapy WFL for tasks assessed/performed              Past Medical History:  Diagnosis Date   Arthritis    oa knees   Cystocele with prolapse    Hyperlipemia    Hypertension    Hypothyroidism    Sleep apnea    cpap set on 25   SUI (stress urinary incontinence, female)    Wears contact lenses    Past Surgical History:  Procedure Laterality Date   ABDOMINAL HYSTERECTOMY     ANTERIOR AND POSTERIOR REPAIR N/A 03/12/2017   Procedure: ANTERIOR (CYSTOCELE) AND POSTERIOR REPAIR (RECTOCELE);  Surgeon: Romualdo Bolk, MD;  Location: [facility];  Service: Gynecology;  Laterality: N/A;   BLADDER SUSPENSION N/A 05/03/2020   Procedure: TRANSVAGINAL TAPE (TVT) PROCEDURE;  Surgeon: Marguerita Beards, MD;  Location: [facility];  Service: Gynecology;  Laterality: N/A;   CARPAL TUNNEL RELEASE Bilateral yrs ago   CHOLECYSTECTOMY  2001   laparoscopic   CYSTOCELE REPAIR N/A 05/03/2020   Procedure: ANTERIOR REPAIR (CYSTOCELE);  Surgeon: Marguerita Beards, MD;  Location: [facility];  Service: Gynecology;  Laterality: N/A;  total time needed for 3 procedures is 1.5hrs   CYSTOSCOPY N/A 03/12/2017   Procedure: CYSTOSCOPY;  Surgeon: Romualdo Bolk, MD;  Location: [facility];  Service: Gynecology;  Laterality: N/A;   CYSTOSCOPY N/A 05/03/2020   Procedure: CYSTOSCOPY;  Surgeon: Marguerita Beards, MD;  Location: [facility];  Service: Gynecology;  Laterality: N/A;   KNEE  ARTHROSCOPY Left 12/2019   KNEE ARTHROSCOPY WITH MEDIAL MENISECTOMY Right 02/13/2019   Procedure: KNEE ARTHROSCOPY WITH MEDIAL MENISECTOMY;  Surgeon: Juanell Fairly, MD;  Location: [facility];  Service: Orthopedics;  Laterality: Right;   LACRIMAL DUCT PROBING W/ DACRYOPLASTY Bilateral    SALPINGOOPHORECTOMY Left 03/12/2017   Procedure: SALPINGECTOMY;  Surgeon: Romualdo Bolk, MD;  Location: [name];  Service: Gynecology;  Laterality: Left;   SHOULDER ARTHROSCOPY WITH OPEN ROTATOR CUFF REPAIR Right 09/03/2014   Procedure: SHOULDER ARTHROSCOPY WITH OPEN ROTATOR CUFF REPAIR;  Surgeon: Juanell Fairly, MD;  Location: [facility];  Service: Orthopedics;  Laterality: Right;   THYROIDECTOMY Right yrs ago   hemi-thyroidectomy   TOTAL KNEE ARTHROPLASTY Right 05/20/2019   Procedure: TOTAL KNEE ARTHROPLASTY;  Surgeon: Juanell Fairly, MD;  Location: [facility];  Service: Orthopedics;  Laterality: Right;   TOTAL KNEE ARTHROPLASTY Left 12/23/2019   Procedure: Left TOTAL KNEE ARTHROPLASTY;  Surgeon: Juanell Fairly, MD;  Location: [facility];  Service: Orthopedics;  Laterality: Left;   VAGINAL HYSTERECTOMY N/A 03/12/2017   Procedure: HYSTERECTOMY VAGINAL;  Surgeon: Romualdo Bolk, MD;  Location: [facility];  Service: Gynecology;  Laterality: N/A;   Patient Active Problem List   Diagnosis Date Noted   S/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, left 12/23/2019   S/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, right 05/20/2019   H/O total vaginal  hysterectomy 03/12/2017   Essential hypertension 03/08/2017   Mixed dyslipidemia 03/08/2017   Abnormal EKG 03/08/2017   Preoperative cardiovascular examination 03/08/2017   Allergic state 12/11/2013   High cholesterol 12/11/2013   History of depression 12/11/2013   Hypothyroidism 12/11/2013   Sleep apnea 12/11/2013    PCP: Gracelyn Nurse, MD  REFERRING PROVIDER: Juanell Fairly, MD  REFERRING DIAG: M75.41  (ICD-10-CM) - Impingement syndrome of right shoulder  THERAPY DIAG:  Right shoulder pain, unspecified chronicity  Muscle weakness (generalized)  Rationale for Evaluation and Treatment: Rehabilitation  ONSET DATE: > 6 months   SUBJECTIVE:                                                                                                                                                                                      SUBJECTIVE STATEMENT: Began at least 6 months ago but has worsened recently. Had shoulder arthroscopy with open rotator cuff repair in 2016 by Dr. Nyoka Lint. Patient reports pain into R hand (pinky and ring finger). Patient reports pain at rest and reaching overhead. Patient has difficulty holding heavy objects (>5 lbs). Medication (ibuprofen primarily) helps mediate pain.  Unknown MOI. Feels as though it may have something to do with previous surgery on R shoulder years ago.   Hand dominance: Right  PERTINENT HISTORY: S/p R shoulder arthroscopy with open rotator cuff repair in 08/2014  PAIN:  Are you having pain? Yes: NPRS scale: 4-5/10 Pain location: R shoulder Pain description: "painful" Aggravating factors: rest, reaching overhead  Relieving factors: medication  PRECAUTIONS: None  RED FLAGS: None   WEIGHT BEARING RESTRICTIONS: No  FALLS:  Has patient fallen in last 6 months? 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      SENSATION: WFL  POSTURE: Rounded shoulders, forward head posture   UPPER EXTREMITY ROM:   Active ROM  Right eval Left eval  Shoulder flexion 120 140  Shoulder extension    Shoulder abduction 118 138  Shoulder adduction    Shoulder internal rotation    Shoulder external rotation    (Blank rows = not tested)  UPPER EXTREMITY MMT:  MMT Right eval Left eval  Shoulder flexion 4- 4+  Shoulder extension    Shoulder abduction 4- 4  Shoulder adduction    Shoulder internal rotation 4 4  Shoulder external rotation 4 4  (Blank rows = not tested)  * = pain  SHOULDER SPECIAL TESTS: performed on R shoulder  Impingement tests: Neer impingement test: positive  and Hawkins/Kennedy impingement test: positive  Rotator cuff assessment: Empty can test: positive , Full can test: positive , and External rotation lag sign: negative Biceps assessment: Speed's test: positive   JOINT MOBILITY TESTING:  WFL  PALPATION:  TTP R coracoid process and R supraspinatus tendon insertion (+) trigger point R supraspinatus   TODAY'S TREATMENT:                                                                                                                                         DATE: 09/11/22  Subjective:  Pt. Reports "usual"  2/10 R shoulder "soreness"/ discomfort prior to PT session, says she is still taking ibuprofen beforehand to help with pain. Reports HEP is still going well.   There.ex.:  Seated B UBE: 3 min. F/b.  Consistent cadence/ discussed weekend activities.    Standing Wall Clocks on R arm with Banded Wall Walks: Red TB, 1x5   Standing 90-90 Resisted ER with red TB: 2 x 10  Nautilus with wand: Seated lat. Pull downs 50# 15x2; scap. Retraction 50# 15x2; chest press 20# 2x15; tricep extension 40# 2x15;  Seated R shoulder AA/PROM (flexion/ abduction/ IR)- 5x (pain tolerable range).    PT educated pt. On the benefits of ice to R shoulder to manage inflammation/ pain after activity/ ex.  Manual tx.: 10 min.   Seated R UT stretch with light STM to UT/posterior deltoid/ scapular musculature.     8 mins of MH applied to B UT at end of session  Not today:  Nautilus with wand: chest press 20# 2x15  PATIENT EDUCATION: Education details: HEP, goals, POC  Person educated: Patient Education method: Explanation, Demonstration, and Handouts Education comprehension: verbalized understanding and returned demonstration  HOME EXERCISE PROGRAM: Access Code: [email or web address] URL: [email or web address] Date: 08/23/2022 Prepared by: Maylon Peppers  Exercises - Seated Scapular Retraction  - 2-3 x daily - 5-7 x weekly - 3 sets - 10 reps - Standing Isometric Shoulder Internal Rotation at Doorway  - 2 x daily - 5 x weekly - 3 sets - 10 reps - Standing Isometric Shoulder External Rotation with Doorway  - 2 x daily - 5 x weekly - 3 sets - 10 reps  Access Code: [email or web address] URL: [email or web address] Date: 08/28/2022 Prepared by: Dorene Grebe  Exercises - Shoulder External Rotation and Scapular Retraction with Resistance  - 1 x daily - 4 x weekly - 2 sets - 10 reps - Scapular Retraction with Resistance  - 1 x daily - 4 x weekly - 2 sets - 10 reps - Standing Shoulder Horizontal Abduction with Resistance  - 1 x daily - 4 x weekly - 2 sets -  10 reps - Standing Shoulder Diagonal Horizontal Abduction 60/120 Degrees with Resistance  - 1 x daily - 4 x weekly - 2 sets - 10 reps  ASSESSMENT:  CLINICAL IMPRESSION: Pt reports to PT with usual 2/10 R shoulder pain/soreness. Today's session consisted of strengthening/stabilizing exercises for R shoulder and periscapular muscles. STM, stretches, and MH added to try and improve R UT tissue extensibility and mobility; pt continues to demonstrate trigger point in R UT and will continue to benefit from manual techniques to improve symptoms. Pt requires occasional cueing for correct form with nautilus exercises. She reports no adverse symptoms or increased pain with any of the exercises. Pt will benefit from continued PT services upon discharge  to safely address deficits listed in patient problem list for decreased caregiver assistance and eventual return to PLOF.   OBJECTIVE IMPAIRMENTS: decreased activity tolerance, decreased endurance, decreased ROM, decreased strength, increased muscle spasms, impaired UE functional use, postural dysfunction, and pain.   ACTIVITY LIMITATIONS: carrying, lifting, sleeping, dressing, and reach over head  PARTICIPATION LIMITATIONS: laundry, community activity, and yard work  PERSONAL FACTORS: Age, Past/current experiences, and Time since onset of injury/illness/exacerbation are also affecting patient's functional outcome.   REHAB POTENTIAL: Good  CLINICAL DECISION MAKING: Stable/uncomplicated  EVALUATION COMPLEXITY: Low   GOALS: Goals reviewed with patient? Yes  SHORT TERM GOALS: Target date: 09/13/2022  Patient will be independent in HEP to improve strength/mobility for better functional independence with ADLs. Baseline: 7/24: see above Goal status: Goal met  LONG TERM GOALS: Target date: 10/04/2022  Patient will increase FOTO score to equal to or greater than 66 to demonstrate statistically significant improvement in UE use and pain management Baseline: 7/24: 55 Goal status: INITIAL  2.  Patient will improve R shoulder AROM to symmetrical range in flexion and abduction to L shoulder with pain <2/10 for improved ability to perform overhead activities. Baseline: 7/24: see above Goal status: INITIAL  3.  Patient will increase B UE strength by 1/2 MMT grade to improve functional strength and ability to perform overhead activities and  Baseline: 7/24: see above Goal status: INITIAL  PLAN:  PT FREQUENCY: 1-2x/week  PT DURATION: 6 weeks  PLANNED INTERVENTIONS: Therapeutic exercises, Therapeutic activity, Neuromuscular re-education, Balance training, Gait training, Patient/Family education, Self Care, Joint mobilization, Dry Needling, Spinal mobilization, Cryotherapy, Moist heat,  Manual therapy, and Re-evaluation  PLAN FOR NEXT SESSION: Progress shoulder/scapular strengthening exercises as tolerated, manual therapy.      Cammie Mcgee, PT, DPT # [phone] Physical Therapist - [facility]  09/11/2022, 12:22 PM

## 2022-09-18 ENCOUNTER — Encounter: Payer: Self-pay | Admitting: Physical Therapy

## 2022-09-18 ENCOUNTER — Ambulatory Visit: Payer: Medicare Other

## 2022-09-18 DIAGNOSIS — M25511 Pain in right shoulder: Secondary | ICD-10-CM | POA: Diagnosis not present

## 2022-09-18 DIAGNOSIS — M6281 Muscle weakness (generalized): Secondary | ICD-10-CM

## 2022-09-18 NOTE — Therapy (Signed)
OUTPATIENT PHYSICAL THERAPY SHOULDER TREATMENT  Patient Name: Jordan Palmer MRN: [id number] DOB:28-Jan-1951, 72 y.o., female Today's Date: 09/18/2022  END OF SESSION:  PT End of Session - 09/18/22 1030     Visit Number 7    Number of Visits 13    Date for PT Re-Evaluation 10/04/22    PT Start Time 1030    PT Stop Time 1114    PT Time Calculation (min) 44 min    Activity Tolerance Patient tolerated treatment well    Behavior During Therapy WFL for tasks assessed/performed               Past Medical History:  Diagnosis Date   Arthritis    oa knees   Cystocele with prolapse    Hyperlipemia    Hypertension    Hypothyroidism    Sleep apnea    cpap set on 25   SUI (stress urinary incontinence, female)    Wears contact lenses    Past Surgical History:  Procedure Laterality Date   ABDOMINAL HYSTERECTOMY     ANTERIOR AND POSTERIOR REPAIR N/A 03/12/2017   Procedure: ANTERIOR (CYSTOCELE) AND POSTERIOR REPAIR (RECTOCELE);  Surgeon: Romualdo Bolk, MD;  Location: [facility];  Service: Gynecology;  Laterality: N/A;   BLADDER SUSPENSION N/A 05/03/2020   Procedure: TRANSVAGINAL TAPE (TVT) PROCEDURE;  Surgeon: Marguerita Beards, MD;  Location: [facility];  Service: Gynecology;  Laterality: N/A;   CARPAL TUNNEL RELEASE Bilateral yrs ago   CHOLECYSTECTOMY  2001   laparoscopic   CYSTOCELE REPAIR N/A 05/03/2020   Procedure: ANTERIOR REPAIR (CYSTOCELE);  Surgeon: Marguerita Beards, MD;  Location: [facility];  Service: Gynecology;  Laterality: N/A;  total time needed for 3 procedures is 1.5hrs   CYSTOSCOPY N/A 03/12/2017   Procedure: CYSTOSCOPY;  Surgeon: Romualdo Bolk, MD;  Location: [facility];  Service: Gynecology;  Laterality: N/A;   CYSTOSCOPY N/A 05/03/2020   Procedure: CYSTOSCOPY;  Surgeon: Marguerita Beards, MD;  Location: [facility];  Service: Gynecology;  Laterality: N/A;   KNEE  ARTHROSCOPY Left 12/2019   KNEE ARTHROSCOPY WITH MEDIAL MENISECTOMY Right 02/13/2019   Procedure: KNEE ARTHROSCOPY WITH MEDIAL MENISECTOMY;  Surgeon: Juanell Fairly, MD;  Location: [facility];  Service: Orthopedics;  Laterality: Right;   LACRIMAL DUCT PROBING W/ DACRYOPLASTY Bilateral    SALPINGOOPHORECTOMY Left 03/12/2017   Procedure: SALPINGECTOMY;  Surgeon: Romualdo Bolk, MD;  Location: [facility];  Service: Gynecology;  Laterality: Left;   SHOULDER ARTHROSCOPY WITH OPEN ROTATOR CUFF REPAIR Right 09/03/2014   Procedure: SHOULDER ARTHROSCOPY WITH OPEN ROTATOR CUFF REPAIR;  Surgeon: Juanell Fairly, MD;  Location: [facility];  Service: Orthopedics;  Laterality: Right;   THYROIDECTOMY Right yrs ago   hemi-thyroidectomy   TOTAL KNEE ARTHROPLASTY Right 05/20/2019   Procedure: TOTAL KNEE ARTHROPLASTY;  Surgeon: Juanell Fairly, MD;  Location: [facility];  Service: Orthopedics;  Laterality: Right;   TOTAL KNEE ARTHROPLASTY Left 12/23/2019   Procedure: Left TOTAL KNEE ARTHROPLASTY;  Surgeon: Juanell Fairly, MD;  Location: [facility];  Service: Orthopedics;  Laterality: Left;   VAGINAL HYSTERECTOMY N/A 03/12/2017   Procedure: HYSTERECTOMY VAGINAL;  Surgeon: Romualdo Bolk, MD;  Location: [facility];  Service: Gynecology;  Laterality: N/A;   Patient Active Problem List   Diagnosis Date Noted   S/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, left 12/23/2019   S/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, right 05/20/2019   H/O total  vaginal hysterectomy 03/12/2017   Essential hypertension 03/08/2017   Mixed dyslipidemia 03/08/2017   Abnormal EKG 03/08/2017   Preoperative cardiovascular examination 03/08/2017   Allergic state 12/11/2013   High cholesterol 12/11/2013   History of depression 12/11/2013   Hypothyroidism 12/11/2013   Sleep apnea 12/11/2013    PCP: Gracelyn Nurse, MD  REFERRING PROVIDER: Juanell Fairly, MD  REFERRING DIAG: M75.41  (ICD-10-CM) - Impingement syndrome of right shoulder  THERAPY DIAG:  Right shoulder pain, unspecified chronicity  Muscle weakness (generalized)  Rationale for Evaluation and Treatment: Rehabilitation  ONSET DATE: > 6 months   SUBJECTIVE:                                                                                                                                                                                      SUBJECTIVE STATEMENT: Began at least 6 months ago but has worsened recently. Had shoulder arthroscopy with open rotator cuff repair in 2016 by Dr. Nyoka Lint. Patient reports pain into R hand (pinky and ring finger). Patient reports pain at rest and reaching overhead. Patient has difficulty holding heavy objects (>5 lbs). Medication (ibuprofen primarily) helps mediate pain.  Unknown MOI. Feels as though it may have something to do with previous surgery on R shoulder years ago.   Hand dominance: Right  PERTINENT HISTORY: S/p R shoulder arthroscopy with open rotator cuff repair in 08/2014  PAIN:  Are you having pain? Yes: NPRS scale: 4-5/10 Pain location: R shoulder Pain description: "painful" Aggravating factors: rest, reaching overhead  Relieving factors: medication  PRECAUTIONS: None  RED FLAGS: None   WEIGHT BEARING RESTRICTIONS: No  FALLS:  Has patient fallen in last 6 months? 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      SENSATION: WFL  POSTURE: Rounded shoulders, forward head posture   UPPER EXTREMITY ROM:   Active ROM  Right eval Left eval  Shoulder flexion 120 140  Shoulder extension    Shoulder abduction 118 138  Shoulder adduction    Shoulder internal rotation    Shoulder external rotation    (Blank rows = not tested)  UPPER EXTREMITY MMT:  MMT Right eval Left eval  Shoulder flexion 4- 4+  Shoulder extension    Shoulder abduction 4-  4  Shoulder adduction    Shoulder internal rotation 4 4  Shoulder external rotation 4 4  (Blank rows = not tested)  * = pain  SHOULDER SPECIAL TESTS: performed on R shoulder  Impingement tests: Neer impingement test: positive  and Hawkins/Kennedy impingement test: positive  Rotator cuff assessment: Empty can test: positive , Full can test: positive , and External rotation lag sign: negative Biceps assessment: Speed's test: positive   JOINT MOBILITY TESTING:  WFL  PALPATION:  TTP R coracoid process and R supraspinatus tendon insertion (+) trigger point R supraspinatus   TODAY'S TREATMENT:                                                                                                                                         DATE: 09/18/22  Subjective: Patient reports "usual"  2/10 R shoulder "soreness". Reports feeling discomfort when moving in "certain" directions and sleeping at night when lying on R side.   There.ex.:  Seated B UBE: 3 mins fwd/3 mins bwd. Consistent cadence/ discussed weekend activities.    Seated 90-90 Resisted ER with red TB: 2 x 10  Seated shoulder horizontal abduction with RTB x 10   Nautilus with wand: standing lat. Pull downs 50# 15x2; scap. Retraction 50# 15x2; chest press 20# 2x15; tricep extension 40# 2x15;  Seated R shoulder AA/PROM (flexion/ abduction/ IR)- 15x (pain tolerable range).    PT educated pt. On the benefits of ice to R shoulder to manage inflammation/ pain after activity/ ex.  Manual tx.: 10 min.   Supine R UT stretch with light STM to UT/posterior deltoid/ scapular musculature.     PATIENT  EDUCATION: Education details: HEP, goals, POC  Person educated: Patient Education method: Explanation, Demonstration, and Handouts Education comprehension: verbalized understanding and returned demonstration  HOME EXERCISE PROGRAM: Access Code: [email or web address] URL: [email or web address] Date: 08/23/2022 Prepared by: Maylon Peppers  Exercises - Seated Scapular Retraction  - 2-3 x daily - 5-7 x weekly - 3 sets - 10 reps - Standing Isometric Shoulder Internal Rotation at Doorway  - 2 x daily - 5 x weekly - 3 sets - 10 reps - Standing Isometric Shoulder External Rotation with Doorway  - 2 x daily - 5 x weekly - 3 sets - 10 reps  Access Code: [email or web address] URL: [email or web address] Date: 08/28/2022 Prepared by: Dorene Grebe  Exercises - Shoulder External Rotation and Scapular Retraction with Resistance  - 1 x daily - 4 x weekly - 2 sets - 10 reps - Scapular Retraction with Resistance  - 1 x daily - 4 x weekly - 2 sets - 10 reps - Standing Shoulder Horizontal Abduction with Resistance  - 1 x daily - 4 x weekly - 2 sets - 10 reps - Standing Shoulder Diagonal Horizontal Abduction 60/120 Degrees with Resistance  - 1 x daily - 4 x weekly - 2 sets - 10 reps  ASSESSMENT:  CLINICAL IMPRESSION:   Pt reports to PT with usual 2/10 R shoulder pain/soreness. Today's session consisted of strengthening/stabilizing exercises for R shoulder and periscapular muscles. STM and stretches, added to try and improve R UT tissue extensibility and mobility; pt continues to demonstrate trigger point in R UT and will continue to benefit from manual techniques to improve symptoms. Patient reporting improvement in pain at end of session. Patient is concerned on lack of significant improvement since starting PT, encouraged patient to contact MD for further guidance on next steps if patient continues to feel like she is not making improvements. Pt will benefit from continued PT services upon discharge to  safely address deficits listed in patient problem list for decreased caregiver assistance and eventual return to PLOF.   OBJECTIVE IMPAIRMENTS: decreased activity tolerance, decreased endurance, decreased ROM, decreased strength, increased muscle spasms, impaired UE functional use, postural dysfunction, and pain.   ACTIVITY LIMITATIONS: carrying, lifting, sleeping, dressing, and reach over head  PARTICIPATION LIMITATIONS: laundry, community activity, and yard work  PERSONAL FACTORS: Age, Past/current experiences, and Time since onset of injury/illness/exacerbation are also affecting patient's functional outcome.   REHAB POTENTIAL: Good  CLINICAL DECISION MAKING: Stable/uncomplicated  EVALUATION COMPLEXITY: Low   GOALS: Goals reviewed with patient? Yes  SHORT TERM GOALS: Target date: 09/13/2022  Patient will be independent in HEP to improve strength/mobility for better functional independence with ADLs. Baseline: 7/24: see above Goal status: Goal met  LONG TERM GOALS: Target date: 10/04/2022  Patient will increase FOTO score to equal to or greater than 66 to demonstrate statistically significant improvement in UE use and pain management Baseline: 7/24: 55 Goal status: INITIAL  2.  Patient will improve R shoulder AROM to symmetrical range in flexion and abduction to L shoulder with pain <2/10 for improved ability to perform overhead activities. Baseline: 7/24: see above Goal status: INITIAL  3.  Patient will increase B UE strength by 1/2 MMT grade to improve functional strength and ability to perform overhead activities and  Baseline: 7/24: see above Goal status: INITIAL  PLAN:  PT FREQUENCY: 1-2x/week  PT DURATION: 6 weeks  PLANNED INTERVENTIONS: Therapeutic exercises, Therapeutic activity, Neuromuscular re-education, Balance training, Gait training, Patient/Family education, Self Care, Joint mobilization, Dry Needling, Spinal mobilization, Cryotherapy, Moist heat, Manual  therapy, and Re-evaluation  PLAN FOR NEXT SESSION: Progress shoulder/scapular strengthening exercises as tolerated, manual therapy.      Maylon Peppers, PT, DPT  Physical Therapist - [facility]  09/18/2022, 10:31 AM

## 2022-09-20 ENCOUNTER — Ambulatory Visit: Payer: Medicare Other | Admitting: Physical Therapy

## 2022-09-21 ENCOUNTER — Ambulatory Visit: Payer: Medicare Other | Admitting: Physical Therapy

## 2022-09-25 ENCOUNTER — Ambulatory Visit: Payer: Medicare Other | Admitting: Physical Therapy

## 2022-09-25 ENCOUNTER — Encounter: Payer: Self-pay | Admitting: Physical Therapy

## 2022-09-25 DIAGNOSIS — M25511 Pain in right shoulder: Secondary | ICD-10-CM | POA: Diagnosis not present

## 2022-09-25 DIAGNOSIS — M6281 Muscle weakness (generalized): Secondary | ICD-10-CM

## 2022-09-25 NOTE — Therapy (Signed)
OUTPATIENT PHYSICAL THERAPY SHOULDER TREATMENT  Patient Name: Jordan Palmer MRN: [id number] DOB:09-16-1950, 72 y.o., female Today's Date: 09/25/2022  END OF SESSION:  PT End of Session - 09/25/22 0909     Visit Number 8    Number of Visits 13    Date for PT Re-Evaluation 10/04/22    PT Start Time 0735    PT Stop Time 0820    PT Time Calculation (min) 45 min    Activity Tolerance Patient limited by pain    Behavior During Therapy [facility] for tasks assessed/performed             Past Medical History:  Diagnosis Date   Arthritis    oa knees   Cystocele with prolapse    Hyperlipemia    Hypertension    Hypothyroidism    Sleep apnea    cpap set on 25   SUI (stress urinary incontinence, female)    Wears contact lenses    Past Surgical History:  Procedure Laterality Date   ABDOMINAL HYSTERECTOMY     ANTERIOR AND POSTERIOR REPAIR N/A 03/12/2017   Procedure: ANTERIOR (CYSTOCELE) AND POSTERIOR REPAIR (RECTOCELE);  Surgeon: Romualdo Bolk, MD;  Location: [facility];  Service: Gynecology;  Laterality: N/A;   BLADDER SUSPENSION N/A 05/03/2020   Procedure: TRANSVAGINAL TAPE (TVT) PROCEDURE;  Surgeon: Marguerita Beards, MD;  Location: [facility];  Service: Gynecology;  Laterality: N/A;   CARPAL TUNNEL RELEASE Bilateral yrs ago   CHOLECYSTECTOMY  2001   laparoscopic   CYSTOCELE REPAIR N/A 05/03/2020   Procedure: ANTERIOR REPAIR (CYSTOCELE);  Surgeon: Marguerita Beards, MD;  Location: [facility];  Service: Gynecology;  Laterality: N/A;  total time needed for 3 procedures is 1.5hrs   CYSTOSCOPY N/A 03/12/2017   Procedure: CYSTOSCOPY;  Surgeon: Romualdo Bolk, MD;  Location: [facility];  Service: Gynecology;  Laterality: N/A;   CYSTOSCOPY N/A 05/03/2020   Procedure: CYSTOSCOPY;  Surgeon: Marguerita Beards, MD;  Location: [facility];  Service: Gynecology;  Laterality: N/A;   KNEE ARTHROSCOPY  Left 12/2019   KNEE ARTHROSCOPY WITH MEDIAL MENISECTOMY Right 02/13/2019   Procedure: KNEE ARTHROSCOPY WITH MEDIAL MENISECTOMY;  Surgeon: Juanell Fairly, MD;  Location: [facility];  Service: Orthopedics;  Laterality: Right;   LACRIMAL DUCT PROBING W/ DACRYOPLASTY Bilateral    SALPINGOOPHORECTOMY Left 03/12/2017   Procedure: SALPINGECTOMY;  Surgeon: Romualdo Bolk, MD;  Location: [facility];  Service: Gynecology;  Laterality: Left;   SHOULDER ARTHROSCOPY WITH OPEN ROTATOR CUFF REPAIR Right 09/03/2014   Procedure: SHOULDER ARTHROSCOPY WITH OPEN ROTATOR CUFF REPAIR;  Surgeon: Juanell Fairly, MD;  Location: [facility];  Service: Orthopedics;  Laterality: Right;   THYROIDECTOMY Right yrs ago   hemi-thyroidectomy   TOTAL KNEE ARTHROPLASTY Right 05/20/2019   Procedure: TOTAL KNEE ARTHROPLASTY;  Surgeon: Juanell Fairly, MD;  Location: [facility];  Service: Orthopedics;  Laterality: Right;   TOTAL KNEE ARTHROPLASTY Left 12/23/2019   Procedure: Left TOTAL KNEE ARTHROPLASTY;  Surgeon: Juanell Fairly, MD;  Location: [facility];  Service: Orthopedics;  Laterality: Left;   VAGINAL HYSTERECTOMY N/A 03/12/2017   Procedure: HYSTERECTOMY VAGINAL;  Surgeon: Romualdo Bolk, MD;  Location: [facility];  Service: Gynecology;  Laterality: N/A;   Patient Active Problem List   Diagnosis Date Noted   S/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, left 12/23/2019   S/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, right 05/20/2019   H/O total vaginal hysterectomy  03/12/2017   Essential hypertension 03/08/2017   Mixed dyslipidemia 03/08/2017   Abnormal EKG 03/08/2017   Preoperative cardiovascular examination 03/08/2017   Allergic state 12/11/2013   High cholesterol 12/11/2013   History of depression 12/11/2013   Hypothyroidism 12/11/2013   Sleep apnea 12/11/2013    PCP: Gracelyn Nurse, MD  REFERRING PROVIDER: Juanell Fairly, MD  REFERRING DIAG: M75.41 (ICD-10-CM) -  Impingement syndrome of right shoulder  THERAPY DIAG:  Right shoulder pain, unspecified chronicity  Muscle weakness (generalized)  Rationale for Evaluation and Treatment: Rehabilitation  ONSET DATE: > 6 months   SUBJECTIVE:                                                                                                                                                                                      SUBJECTIVE STATEMENT: Began at least 6 months ago but has worsened recently. Had shoulder arthroscopy with open rotator cuff repair in 2016 by Dr. Nyoka Lint. Patient reports pain into R hand (pinky and ring finger). Patient reports pain at rest and reaching overhead. Patient has difficulty holding heavy objects (>5 lbs). Medication (ibuprofen primarily) helps mediate pain.  Unknown MOI. Feels as though it may have something to do with previous surgery on R shoulder years ago.   Hand dominance: Right  PERTINENT HISTORY: S/p R shoulder arthroscopy with open rotator cuff repair in 08/2014  PAIN:  Are you having pain? Yes: NPRS scale: 4-5/10 Pain location: R shoulder Pain description: "painful" Aggravating factors: rest, reaching overhead  Relieving factors: medication  PRECAUTIONS: None  RED FLAGS: None   WEIGHT BEARING RESTRICTIONS: No  FALLS:  Has patient fallen in last 6 months? 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      SENSATION: WFL  POSTURE: Rounded shoulders, forward head posture   UPPER EXTREMITY ROM:   Active ROM Right eval  Left eval  Shoulder flexion 120 140  Shoulder extension    Shoulder abduction 118 138  Shoulder adduction    Shoulder internal rotation    Shoulder external rotation    (Blank rows = not tested)  UPPER EXTREMITY MMT:  MMT Right eval Left eval  Shoulder flexion 4- 4+  Shoulder extension    Shoulder abduction 4- 4  Shoulder adduction    Shoulder internal rotation 4 4  Shoulder external rotation 4 4  (Blank rows = not tested)  * = pain  SHOULDER SPECIAL TESTS: performed on R shoulder  Impingement tests: Neer impingement test: positive  and Hawkins/Kennedy impingement test: positive  Rotator cuff assessment: Empty can test: positive , Full can test: positive , and External rotation lag sign: negative Biceps assessment: Speed's test: positive   JOINT MOBILITY TESTING:  WFL  PALPATION:  TTP R coracoid process and R supraspinatus tendon insertion (+) trigger point R supraspinatus   TODAY'S TREATMENT:                                                                                                                                         DATE: 09/25/22  Subjective: Patient reports increased R shoulder pain over the last week; says it is about 5/10 right now. Says it hurts mostly with movement and her exercise.  PT reviewed pts. HEP.    There.ex.:  Seated B UBE: 4 mins fwd/4 mins bwd. Consistent cadence/ discussed weekend activities.    Seated R shoulder AROM: flexion (155 deg.), abduction (145 deg.), both pain limited but marked increase in AROM  Standing shoulder horizontal abduction with Yellow TB x 10  Isometric Shoulder ER step outs: yellow TB, 1x10 with 2-3 step-outs each rep - increased R shoulder pain with red band  Seated R shoulder AA/PROM (flexion/ abduction)- 10x each (pain tolerable range) - more pain limited with abduction  Ice applied to R shoulder/UT area at end of session to decrease pain, PT educated pt. On the benefits of ice to R shoulder to manage  inflammation/ pain after activity/ ex.  Manual tx.: 13 min.   Supine R UT stretch with light STM/trigger point release to UT/posterior deltoid/ scapular musculature x 8 minutes  Supine R glenohumeral inferior glides: Grades 1-2 for decreased pain x 5 minutes  Not today: Seated 90-90 Resisted ER with red TB: 2 x 10 Nautilus with wand: standing lat. Pull downs 50# 15x2; scap. Retraction 50# 15x2; chest press 20# 2x15; tricep extension 40# 2x15;  PATIENT EDUCATION: Education details: HEP, goals, POC  Person educated: Patient Education method: Explanation, Demonstration, and Handouts Education comprehension: verbalized understanding and returned demonstration  HOME EXERCISE PROGRAM: Access Code: [email or web address] URL: [email or web address] Date: 08/23/2022 Prepared by: Maylon Peppers  Exercises - Seated Scapular Retraction  - 2-3 x daily - 5-7 x weekly - 3 sets - 10 reps - Standing Isometric Shoulder Internal Rotation at Doorway  - 2 x daily - 5 x weekly - 3 sets - 10 reps - Standing Isometric Shoulder External Rotation with Doorway  - 2 x daily - 5 x weekly - 3 sets - 10 reps  Access Code: [email or web address] URL: [email or web address] Date: 08/28/2022 Prepared by: Dorene Grebe  Exercises - Shoulder External Rotation and Scapular Retraction with Resistance  - 1 x daily -  4 x weekly - 2 sets - 10 reps - Scapular Retraction with Resistance  - 1 x daily - 4 x weekly - 2 sets - 10 reps - Standing Shoulder Horizontal Abduction with Resistance  - 1 x daily - 4 x weekly - 2 sets - 10 reps - Standing Shoulder Diagonal Horizontal Abduction 60/120 Degrees with Resistance  - 1 x daily - 4 x weekly - 2 sets - 10 reps  ASSESSMENT:  CLINICAL IMPRESSION:   Pt reports to PT with reports of increased R shoulder pain over the last week. Session today consisted of manual therapy techniques to try and decrease R shoulder pain. Sitting shoulder AROM reassessed immediately post stretching  and inferior glide mobilizations; pt demonstrated improved flexion and abduction compared to last measurement, but continues to be limited by pain with both movements. HEP modified to include isometric ER step-outs instead of shoulder external rotation and scapular retraction; pt educated to use yellow band for HEP to try and calm down R shoulder irritation. Pt will benefit from continued PT services upon discharge to safely address deficits listed in patient problem list for decreased caregiver assistance and eventual return to PLOF.   OBJECTIVE IMPAIRMENTS: decreased activity tolerance, decreased endurance, decreased ROM, decreased strength, increased muscle spasms, impaired UE functional use, postural dysfunction, and pain.   ACTIVITY LIMITATIONS: carrying, lifting, sleeping, dressing, and reach over head  PARTICIPATION LIMITATIONS: laundry, community activity, and yard work  PERSONAL FACTORS: Age, Past/current experiences, and Time since onset of injury/illness/exacerbation are also affecting patient's functional outcome.   REHAB POTENTIAL: Good  CLINICAL DECISION MAKING: Stable/uncomplicated  EVALUATION COMPLEXITY: Low   GOALS: Goals reviewed with patient? Yes  SHORT TERM GOALS: Target date: 09/13/2022  Patient will be independent in HEP to improve strength/mobility for better functional independence with ADLs. Baseline: 7/24: see above Goal status: Goal met  LONG TERM GOALS: Target date: 10/04/2022  Patient will increase FOTO score to equal to or greater than 66 to demonstrate statistically significant improvement in UE use and pain management Baseline: 7/24: 55 Goal status: INITIAL  2.  Patient will improve R shoulder AROM to symmetrical range in flexion and abduction to L shoulder with pain <2/10 for improved ability to perform overhead activities. Baseline: 7/24: see above Goal status: INITIAL  3.  Patient will increase B UE strength by 1/2 MMT grade to improve functional  strength and ability to perform overhead activities and  Baseline: 7/24: see above Goal status: INITIAL  PLAN:  PT FREQUENCY: 1-2x/week  PT DURATION: 6 weeks  PLANNED INTERVENTIONS: Therapeutic exercises, Therapeutic activity, Neuromuscular re-education, Balance training, Gait training, Patient/Family education, Self Care, Joint mobilization, Dry Needling, Spinal mobilization, Cryotherapy, Moist heat, Manual therapy, and Re-evaluation  PLAN FOR NEXT SESSION: Progress shoulder/scapular strengthening exercises as tolerated, manual therapy. Reassess pain/shoulder AROM as necessary.    Cammie Mcgee, PT, DPT # [id number] Cena Benton, SPT 09/25/22, 9:52 AM

## 2022-10-05 ENCOUNTER — Ambulatory Visit: Payer: Medicare Other | Attending: Orthopedic Surgery | Admitting: Physical Therapy

## 2022-10-05 ENCOUNTER — Encounter: Payer: Self-pay | Admitting: Physical Therapy

## 2022-10-05 DIAGNOSIS — M6281 Muscle weakness (generalized): Secondary | ICD-10-CM | POA: Diagnosis present

## 2022-10-05 DIAGNOSIS — M25511 Pain in right shoulder: Secondary | ICD-10-CM | POA: Insufficient documentation

## 2022-10-05 NOTE — Therapy (Signed)
OUTPATIENT PHYSICAL THERAPY SHOULDER TREATMENT  Patient Name: Jordan Palmer MRN: [id number] DOB:10-30-1950, 72 y.o., female Today's Date: 10/05/2022  END OF SESSION:  PT End of Session - 10/05/22 0733     Visit Number 9    Number of Visits 13    Date for PT Re-Evaluation 10/04/22    PT Start Time 0730    PT Stop Time 0815    PT Time Calculation (min) 45 min    Activity Tolerance Patient tolerated treatment well    Behavior During Therapy WFL for tasks assessed/performed              Past Medical History:  Diagnosis Date   Arthritis    oa knees   Cystocele with prolapse    Hyperlipemia    Hypertension    Hypothyroidism    Sleep apnea    cpap set on 25   SUI (stress urinary incontinence, female)    Wears contact lenses    Past Surgical History:  Procedure Laterality Date   ABDOMINAL HYSTERECTOMY     ANTERIOR AND POSTERIOR REPAIR N/A 03/12/2017   Procedure: ANTERIOR (CYSTOCELE) AND POSTERIOR REPAIR (RECTOCELE);  Surgeon: Romualdo Bolk, MD;  Location: [facility];  Service: Gynecology;  Laterality: N/A;   BLADDER SUSPENSION N/A 05/03/2020   Procedure: TRANSVAGINAL TAPE (TVT) PROCEDURE;  Surgeon: Marguerita Beards, MD;  Location: [facility];  Service: Gynecology;  Laterality: N/A;   CARPAL TUNNEL RELEASE Bilateral yrs ago   CHOLECYSTECTOMY  2001   laparoscopic   CYSTOCELE REPAIR N/A 05/03/2020   Procedure: ANTERIOR REPAIR (CYSTOCELE);  Surgeon: Marguerita Beards, MD;  Location: [facility];  Service: Gynecology;  Laterality: N/A;  total time needed for 3 procedures is 1.5hrs   CYSTOSCOPY N/A 03/12/2017   Procedure: CYSTOSCOPY;  Surgeon: Romualdo Bolk, MD;  Location: [facility];  Service: Gynecology;  Laterality: N/A;   CYSTOSCOPY N/A 05/03/2020   Procedure: CYSTOSCOPY;  Surgeon: Marguerita Beards, MD;  Location: [facility];  Service: Gynecology;  Laterality: N/A;   KNEE  ARTHROSCOPY Left 12/2019   KNEE ARTHROSCOPY WITH MEDIAL MENISECTOMY Right 02/13/2019   Procedure: KNEE ARTHROSCOPY WITH MEDIAL MENISECTOMY;  Surgeon: Juanell Fairly, MD;  Location: [facility];  Service: Orthopedics;  Laterality: Right;   LACRIMAL DUCT PROBING W/ DACRYOPLASTY Bilateral    SALPINGOOPHORECTOMY Left 03/12/2017   Procedure: SALPINGECTOMY;  Surgeon: Romualdo Bolk, MD;  Location: [facility];  Service: Gynecology;  Laterality: Left;   SHOULDER ARTHROSCOPY WITH OPEN ROTATOR CUFF REPAIR Right 09/03/2014   Procedure: SHOULDER ARTHROSCOPY WITH OPEN ROTATOR CUFF REPAIR;  Surgeon: Juanell Fairly, MD;  Location: [facility];  Service: Orthopedics;  Laterality: Right;   THYROIDECTOMY Right yrs ago   hemi-thyroidectomy   TOTAL KNEE ARTHROPLASTY Right 05/20/2019   Procedure: TOTAL KNEE ARTHROPLASTY;  Surgeon: Juanell Fairly, MD;  Location: [facility];  Service: Orthopedics;  Laterality: Right;   TOTAL KNEE ARTHROPLASTY Left 12/23/2019   Procedure: Left TOTAL KNEE ARTHROPLASTY;  Surgeon: Juanell Fairly, MD;  Location: [facility];  Service: Orthopedics;  Laterality: Left;   VAGINAL HYSTERECTOMY N/A 03/12/2017   Procedure: HYSTERECTOMY VAGINAL;  Surgeon: Romualdo Bolk, MD;  Location: [facility];  Service: Gynecology;  Laterality: N/A;   Patient Active Problem List   Diagnosis Date Noted   S/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, left 12/23/2019   S/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, right 05/20/2019   H/O total vaginal  hysterectomy 03/12/2017   Essential hypertension 03/08/2017   Mixed dyslipidemia 03/08/2017   Abnormal EKG 03/08/2017   Preoperative cardiovascular examination 03/08/2017   Allergic state 12/11/2013   High cholesterol 12/11/2013   History of depression 12/11/2013   Hypothyroidism 12/11/2013   Sleep apnea 12/11/2013    PCP: Gracelyn Nurse, MD  REFERRING PROVIDER: Juanell Fairly, MD  REFERRING DIAG: M75.41  (ICD-10-CM) - Impingement syndrome of right shoulder  THERAPY DIAG:  Right shoulder pain, unspecified chronicity  Muscle weakness (generalized)  Rationale for Evaluation and Treatment: Rehabilitation  ONSET DATE: > 6 months   SUBJECTIVE:                                                                                                                                                                                      SUBJECTIVE STATEMENT: Began at least 6 months ago but has worsened recently. Had shoulder arthroscopy with open rotator cuff repair in 2016 by Dr. Nyoka Lint. Patient reports pain into R hand (pinky and ring finger). Patient reports pain at rest and reaching overhead. Patient has difficulty holding heavy objects (>5 lbs). Medication (ibuprofen primarily) helps mediate pain.  Unknown MOI. Feels as though it may have something to do with previous surgery on R shoulder years ago.   Hand dominance: Right  PERTINENT HISTORY: S/p R shoulder arthroscopy with open rotator cuff repair in 08/2014  PAIN:  Are you having pain? Yes: NPRS scale: 4-5/10 Pain location: R shoulder Pain description: "painful" Aggravating factors: rest, reaching overhead  Relieving factors: medication  PRECAUTIONS: None  RED FLAGS: None   WEIGHT BEARING RESTRICTIONS: No  FALLS:  Has patient fallen in last 6 months? No  LIVING ENVIRONMENT: Lives with: lives with their spouse Lives in: House/apartment Stairs: Yes: External: 4 steps; none Has following equipment at home: None  OCCUPATION: Retired OR nurse  PLOF: Independent  PATIENT GOALS:to not hurt anymore   NEXT MD VISIT: 10/2022  OBJECTIVE:   DIAGNOSTIC FINDINGS:  N/A  PATIENT SURVEYS:  FOTO 66 with goal of 11  COGNITION: Overall cognitive status: History of cognitive impairments - at baseline - mild memory deficits      SENSATION: WFL  POSTURE: Rounded shoulders, forward head posture   UPPER EXTREMITY ROM:   Active ROM  Right eval Left eval  Shoulder flexion 120 140  Shoulder extension    Shoulder abduction 118 138  Shoulder adduction    Shoulder internal rotation    Shoulder external rotation    (Blank rows = not tested)  UPPER EXTREMITY MMT:  MMT Right eval Left eval  Shoulder flexion 4- 4+  Shoulder extension    Shoulder abduction 4- 4  Shoulder adduction    Shoulder internal rotation 4 4  Shoulder external rotation 4 4  (Blank rows = not tested)  * = pain  SHOULDER SPECIAL TESTS: performed on R shoulder  Impingement tests: Neer impingement test: positive  and Hawkins/Kennedy impingement test: positive  Rotator cuff assessment: Empty can test: positive , Full can test: positive , and External rotation lag sign: negative Biceps assessment: Speed's test: positive   JOINT MOBILITY TESTING:  WFL  PALPATION:  TTP R coracoid process and R supraspinatus tendon insertion (+) trigger point R supraspinatus   TODAY'S TREATMENT:                                                                                                                                         DATE: 10/05/22  Subjective: Pt reports improved R shoulder pain over the past several days. She says she is experiencing 3/10 pain right now; HEP still going well.  UPPER EXTREMITY MMT:  MMT Right eval Left eval  Shoulder flexion 4+ 5  Shoulder extension    Shoulder abduction 4+ 4+  Shoulder adduction    Shoulder extension    Shoulder internal rotation 5 5  Shoulder external rotation 4 4+   (Blank rows = not tested)    FOTO: 70 today  There.ex.:  Seated B UBE: 3 mins fwd/3 mins bwd. Consistent cadence/ discussed weekend activities.    Nautilus with wand: standing lat. Pull downs 50# 15x2; Shoulder Extensions 40# 2x15;  scap. Retraction 50# 15x2  Standing shoulder horizontal abduction with Red TB x 10 - reviewed for home, mild R shoulder pain  Standing resisted shoulder ER: red TB, x10 - reviewed for home, very mild R  shoulder pain  Shoulder Wall Walks with Green TB: 2 laps - increased R shoulder pain at end of second lap  Seated R shoulder AA/PROM (flexion)- 10x each (pain tolerable range)  Ice applied to R shoulder/UT area at end of session to decrease pain, PT educated pt. On the benefits of ice to R shoulder to manage inflammation/ pain after activity/ ex.  Manual tx.: 8 minutes  Supine R glenohumeral inferior glides: Grades 1-2 for decreased pain x 8 minutes  Not today: Isometric Shoulder ER step outs: yellow TB, 1x10 with 2-3 step-outs each rep - increased R shoulder pain with red band Seated 90-90 Resisted ER with red TB: 2 x 10 Nautilus exercises: chest press 20# 2x15; tricep extension 40# 2x1  PATIENT EDUCATION: Education details: HEP, goals, POC  Person educated: Patient Education method: Explanation, Demonstration, and Handouts Education comprehension: verbalized understanding and returned demonstration  HOME EXERCISE PROGRAM: Access Code: [email or web address] URL: [email or web address] Date: 08/23/2022 Prepared by: Maylon Peppers  Exercises - Seated Scapular Retraction  - 2-3 x daily - 5-7 x weekly - 3 sets - 10 reps - Standing Isometric Shoulder Internal Rotation at Doorway  - 2 x daily - 5 x weekly -  3 sets - 10 reps - Standing Isometric Shoulder External Rotation with Doorway  - 2 x daily - 5 x weekly - 3 sets - 10 reps  Access Code: [email or web address] URL: [email or web address] Date: 08/28/2022 Prepared by: Dorene Grebe  Exercises - Shoulder External Rotation and Scapular Retraction with Resistance  - 1 x daily - 4 x weekly - 2 sets - 10 reps - Scapular Retraction with Resistance  - 1 x daily - 4 x weekly - 2 sets - 10 reps - Standing Shoulder Horizontal Abduction with Resistance  - 1 x daily - 4 x weekly - 2 sets - 10 reps - Standing Shoulder Diagonal Horizontal Abduction 60/120 Degrees with Resistance  - 1 x daily - 4 x weekly - 2 sets - 10  reps  ASSESSMENT:  CLINICAL IMPRESSION:   Pt reports to PT with reports of improved R shoulder pain/symptoms over the last week. Pt's MMT reassessed at beginning of session today; pt demonstrated improvements in all planes except R external rotation. Pt also demonstrates symmetrical AROM with minimal pain on the R side. Session today consisted of briefly reviewing some of her exercises in her HEP; pt demonstrated good recall and form with no pain. Rest of the session consisted of shoulder and periscapular strengthening exercises. Pt will benefit from continued PT services upon discharge to safely address deficits listed in patient problem list for decreased caregiver assistance and eventual return to PLOF.   OBJECTIVE IMPAIRMENTS: decreased activity tolerance, decreased endurance, decreased ROM, decreased strength, increased muscle spasms, impaired UE functional use, postural dysfunction, and pain.   ACTIVITY LIMITATIONS: carrying, lifting, sleeping, dressing, and reach over head  PARTICIPATION LIMITATIONS: laundry, community activity, and yard work  PERSONAL FACTORS: Age, Past/current experiences, and Time since onset of injury/illness/exacerbation are also affecting patient's functional outcome.   REHAB POTENTIAL: Good  CLINICAL DECISION MAKING: Stable/uncomplicated  EVALUATION COMPLEXITY: Low   GOALS: Goals reviewed with patient? Yes  SHORT TERM GOALS: Target date: 09/13/2022  Patient will be independent in HEP to improve strength/mobility for better functional independence with ADLs. Baseline: 7/24: see above Goal status: Goal met  LONG TERM GOALS: Target date: 10/04/2022  Patient will increase FOTO score to equal to or greater than 66 to demonstrate statistically significant improvement in UE use and pain management Baseline: 7/24: 55; 9/5: 70 Goal status: GOAL MET  2.  Patient will improve R shoulder AROM to symmetrical range in flexion and abduction to L shoulder with pain  <2/10 for improved ability to perform overhead activities. Baseline: 7/24: see above Goal status: GOAL MET  3.  Patient will increase B UE strength by 1/2 MMT grade to improve functional strength and ability to perform overhead activities and  Baseline: 7/24: see above; 9/5 see above Goal status: Partially met  PLAN:  PT FREQUENCY: 1-2x/week  PT DURATION: 6 weeks  PLANNED INTERVENTIONS: Therapeutic exercises, Therapeutic activity, Neuromuscular re-education, Balance training, Gait training, Patient/Family education, Self Care, Joint mobilization, Dry Needling, Spinal mobilization, Cryotherapy, Moist heat, Manual therapy, and Re-evaluation  PLAN FOR NEXT SESSION: Progress shoulder/scapular strengthening exercises as tolerated, manual therapy. Reassess pain/shoulder AROM as necessary.    Cammie Mcgee, PT, DPT # [phone] [place], SPT 10/05/22, 12:52 PM

## 2022-10-09 ENCOUNTER — Ambulatory Visit: Payer: Medicare Other | Admitting: Physical Therapy

## 2022-10-16 ENCOUNTER — Encounter: Payer: Medicare Other | Admitting: Physical Therapy

## 2022-10-23 ENCOUNTER — Encounter: Payer: Medicare Other | Admitting: Physical Therapy

## 2022-12-07 ENCOUNTER — Ambulatory Visit: Payer: Medicare Other | Admitting: Dermatology

## 2023-03-19 ENCOUNTER — Other Ambulatory Visit: Payer: Self-pay

## 2023-03-19 NOTE — Telephone Encounter (Signed)
Pt scheduled to see you on 04/16/2023 for New pt AEX since it will be after her 84yr mark.   However, still established as of today (until 04/03/3023) if you would like to send refill before appt.  Rx pend.

## 2023-03-19 NOTE — Telephone Encounter (Signed)
Med refill request: Vaginal cream Last AEX: 12/2018-JJ Next AEX: nothing scheduled.  Last MMG (if hormonal med): 06/12/2022-WNL Refill authorized: rx pend.  Pt LVM in med refill line requesting rx refill on vaginal cream. Since not seen since 03/2020, will send msg to appt desk to reach out to schedule appt.

## 2023-03-20 MED ORDER — PREMARIN 0.625 MG/GM VA CREA: TOPICAL_CREAM | VAGINAL | 1 refills | Status: DC

## 2023-03-20 NOTE — Telephone Encounter (Signed)
Pt notified that rx was sent. However, the cost of rx is too expensive for the pt and she will not being picking it up or using it either way until she sees the provider. However, will want to discuss possible alternate for rx when she comes to see her. Encounter closed.

## 2023-03-28 ENCOUNTER — Emergency Department: Payer: Medicare Other

## 2023-03-28 ENCOUNTER — Other Ambulatory Visit: Payer: Self-pay

## 2023-03-28 ENCOUNTER — Encounter: Payer: Self-pay | Admitting: Emergency Medicine

## 2023-03-28 ENCOUNTER — Emergency Department
Admission: EM | Admit: 2023-03-28 | Discharge: 2023-03-28 | Disposition: A | Discharge status: Home / Self Care | Payer: Medicare Other | Attending: Emergency Medicine | Admitting: Emergency Medicine

## 2023-03-28 DIAGNOSIS — M25472 Effusion, left ankle: Secondary | ICD-10-CM | POA: Insufficient documentation

## 2023-03-28 DIAGNOSIS — I8392 Asymptomatic varicose veins of left lower extremity: Secondary | ICD-10-CM | POA: Diagnosis not present

## 2023-03-28 DIAGNOSIS — R600 Localized edema: Secondary | ICD-10-CM | POA: Insufficient documentation

## 2023-03-28 LAB — CBC WITH DIFFERENTIAL/PLATELET
Abs Immature Granulocytes: 0.04 10*3/uL (ref 0.00–0.07)
Basophils Absolute: 0.1 10*3/uL (ref 0.0–0.1)
Basophils Relative: 1 %
Eosinophils Absolute: 0.2 10*3/uL (ref 0.0–0.5)
Eosinophils Relative: 3 %
HCT: 32.8 % — ABNORMAL LOW (ref 36.0–46.0)
Hemoglobin: 11.2 g/dL — ABNORMAL LOW (ref 12.0–15.0)
Immature Granulocytes: 1 %
Lymphocytes Relative: 24 %
Lymphs Abs: 1.4 10*3/uL (ref 0.7–4.0)
MCH: 33.5 pg (ref 26.0–34.0)
MCHC: 34.1 g/dL (ref 30.0–36.0)
MCV: 98.2 fL (ref 80.0–100.0)
Monocytes Absolute: 0.5 10*3/uL (ref 0.1–1.0)
Monocytes Relative: 9 %
Neutro Abs: 3.8 10*3/uL (ref 1.7–7.7)
Neutrophils Relative %: 62 %
Platelets: 250 10*3/uL (ref 150–400)
RBC: 3.34 MIL/uL — ABNORMAL LOW (ref 3.87–5.11)
RDW: 12.8 % (ref 11.5–15.5)
WBC: 6.1 10*3/uL (ref 4.0–10.5)
nRBC: 0 % (ref 0.0–0.2)

## 2023-03-28 LAB — BASIC METABOLIC PANEL
Anion gap: 12 (ref 5–15)
BUN: 24 mg/dL — ABNORMAL HIGH (ref 8–23)
CO2: 23 mmol/L (ref 22–32)
Calcium: 9.5 mg/dL (ref 8.9–10.3)
Chloride: 104 mmol/L (ref 98–111)
Creatinine, Ser: 0.9 mg/dL (ref 0.44–1.00)
GFR, Estimated: >60 mL/min (ref >60)
Glucose, Bld: 115 mg/dL — ABNORMAL HIGH (ref 70–99)
Potassium: 3.6 mmol/L (ref 3.5–5.1)
Sodium: 139 mmol/L (ref 135–145)

## 2023-03-28 LAB — BRAIN NATRIURETIC PEPTIDE: B Natriuretic Peptide: 22.2 pg/mL (ref 0.0–100.0)

## 2023-03-28 LAB — PROTIME-INR
INR: 1 (ref 0.8–1.2)
Prothrombin Time: 13.5 s (ref 11.4–15.2)

## 2023-03-28 LAB — APTT: aPTT: 29 s (ref 24–36)

## 2023-03-28 NOTE — ED Notes (Signed)
46

## 2023-03-28 NOTE — ED Triage Notes (Signed)
 Patient to ED via POV for left ankle pain. Denies injury. States swelling ongoing for a couple months. States that last night she had a large amount of bleeding from the ankle.

## 2023-03-28 NOTE — ED Notes (Signed)
 Blue top and basic labs sent with"save tube" labels

## 2023-03-28 NOTE — ED Provider Notes (Signed)
 [facility] Provider Note    Event Date/Time   First MD Initiated Contact with Patient 03/28/23 [phone]     (approximate)   History   Ankle Pain   HPI Jordan Palmer is a 73 y.o. female presenting today for left ankle swelling and bleeding.  Patient notes over the past 2 months she has had swelling to the left ankle.  Has also had some minor swelling on the right side.  Last night she had an episode where he wound opened up on the top of her foot and was bleeding.  It is no longer bleeding on arrival here.  She denies any obvious traumatic injury.  No falls within the past 2 months.  No prior history of blood clots.  No pain when walking on the ankle.  Denies fever or chills.  No redness to the area or other insect bites.     Physical Exam   Triage Vital Signs: ED Triage Vitals  Encounter Vitals Group     BP 03/28/23 0743 (!) 131/101     Systolic BP Percentile --      Diastolic BP Percentile --      Pulse Rate 03/28/23 0743 (!) 109     Resp 03/28/23 0743 17     Temp 03/28/23 0743 98.6 F (37 C)     Temp Source 03/28/23 0743 Oral     SpO2 03/28/23 0743 96 %     Weight 03/28/23 0741 172 lb (78 kg)     Height 03/28/23 0741 5\' 1"  (1.549 m)     Head Circumference --      Peak Flow --      Pain Score 03/28/23 0741 0     Pain Loc --      Pain Education --      Exclude from Growth Chart --     Most recent vital signs: Vitals:   03/28/23 1052 03/28/23 1052  BP:  120/64  Pulse: 83 83  Resp:  17  Temp:  98.6 F (37 C)  SpO2:     I have reviewed the vital signs. General:  Awake, alert, no acute distress. Head:  Normocephalic, Atraumatic. EENT:  PERRL, EOMI, Oral mucosa pink and moist, Neck is supple. Cardiovascular: Regular rate, 2+ distal pulses. Respiratory:  Normal respiratory effort, symmetrical expansion, no distress.   Extremities:  Moving all four extremities through full ROM without pain.  No significant swelling to either ankle joint.   Mild 1+ pitting edema to bilateral lower extremities. Neuro:  Alert and oriented.  Interacting appropriately.   Skin:  Warm, dry, no rash.  Dried blood to bilateral feet with small punctate wound on the proximal dorsal aspect of the left foot Psych: Appropriate affect.    ED Results / Procedures / Treatments   Labs (all labs ordered are listed, but only abnormal results are displayed) Labs Reviewed  CBC WITH DIFFERENTIAL/PLATELET - Abnormal; Notable for the following components:      Result Value   RBC 3.34 (*)    Hemoglobin 11.2 (*)    HCT 32.8 (*)    All other components within normal limits  BASIC METABOLIC PANEL - Abnormal; Notable for the following components:   Glucose, Bld 115 (*)    BUN 24 (*)    All other components within normal limits  APTT  PROTIME-INR  BRAIN NATRIURETIC PEPTIDE     EKG    RADIOLOGY Independently interpreted ankle of the left side as well as  ultrasound left lower extremity with no acute pathology   PROCEDURES:  Critical Care performed: No  Procedures   MEDICATIONS ORDERED IN ED: Medications - No data to display   IMPRESSION / MDM / ASSESSMENT AND PLAN / ED COURSE  I reviewed the triage vital signs and the nursing notes.                              Differential diagnosis includes, but is not limited to, ankle laceration, ankle injury, traumatic hemarthrosis, DVT, pitting edema secondary to undiagnosed CHF, undiagnosed bleeding disorder  Patient's presentation is most consistent with acute complicated illness / injury requiring diagnostic workup.  Patient is a 73 year old female presenting today for left ankle swelling for 2 months and onset of bleeding last night.  Exam shows slight pitting edema to bilateral lower extremities worse on the left side.  Ankle joint with no pain with range of motion, no edema, no erythema, no warmth.  No suspicion for septic joint.  X-ray of ankle negative.  Family most concerned about possible blood  clot and will get ultrasound to evaluate.  Separately, will get laboratory workup to rule out bleeding disorders or signs of CHF such as low platelets, elevated BNP, or abnormal PT or PTT.  Laboratory workup all reassuring.  Ultrasound of left lower extremity shows no evidence of a DVT.  Patient otherwise asymptomatic and stable at this time.  Will discharge with symptomatic treatment of swelling with elevation and compression stockings as needed and follow-up with PCP as needed.     FINAL CLINICAL IMPRESSION(S) / ED DIAGNOSES   Final diagnoses:  Left ankle swelling     Rx / DC Orders   ED Discharge Orders     None        Note:  This document was prepared using Dragon voice recognition software and may include unintentional dictation errors.   Janith Lima, MD 03/28/23 [phone]

## 2023-03-28 NOTE — Discharge Instructions (Signed)
 Imaging and laboratory workup was reassuring today.  Continue to monitor symptoms at home and you can elevate them as needed or wear compression stockings.  Please follow-up with your PCP for reassessment as needed.

## 2023-04-16 ENCOUNTER — Ambulatory Visit (INDEPENDENT_AMBULATORY_CARE_PROVIDER_SITE_OTHER): Payer: Medicare Other | Admitting: Obstetrics and Gynecology

## 2023-04-16 ENCOUNTER — Encounter: Payer: Self-pay | Admitting: Obstetrics and Gynecology

## 2023-04-16 VITALS — BP 124/78 | HR 73 | Ht 59.75 in | Wt 172.0 lb

## 2023-04-16 DIAGNOSIS — Z01419 Encounter for gynecological examination (general) (routine) without abnormal findings: Secondary | ICD-10-CM | POA: Diagnosis not present

## 2023-04-16 DIAGNOSIS — N8110 Cystocele, unspecified: Secondary | ICD-10-CM | POA: Diagnosis not present

## 2023-04-16 DIAGNOSIS — D229 Melanocytic nevi, unspecified: Secondary | ICD-10-CM | POA: Diagnosis not present

## 2023-04-16 DIAGNOSIS — Z1231 Encounter for screening mammogram for malignant neoplasm of breast: Secondary | ICD-10-CM

## 2023-04-16 DIAGNOSIS — N393 Stress incontinence (female) (male): Secondary | ICD-10-CM

## 2023-04-16 DIAGNOSIS — E2839 Other primary ovarian failure: Secondary | ICD-10-CM | POA: Diagnosis not present

## 2023-04-16 DIAGNOSIS — N909 Noninflammatory disorder of vulva and perineum, unspecified: Secondary | ICD-10-CM

## 2023-04-16 NOTE — Progress Notes (Signed)
 73 y.o. y.o. female here for annual exam. No LMP recorded. Patient has had a hysterectomy.   S/p TVH, uterosacral ligament suspension and A&P repair Feb 2019   Ovaries removed [place] has 5 dogs Reports urinary incontinence that is bothersome and feeling a bulge.  Has to wear a daily pad. Pap 03-12-17, MMG 06-12-22, Bone Density 01/28/2019, Colonoscopy  2018 per patient  Dxa 2020 -1.2 osteopenia  Body mass index is 33.87 kg/m.      No data to display          Blood pressure 124/78, pulse 73, height 4' 11.75" (1.518 m), weight 172 lb (78 kg), SpO2 97%.  No results found for: "DIAGPAP", "HPVHIGH", "ADEQPAP"  GYN HISTORY: No results found for: "DIAGPAP", "HPVHIGH", "ADEQPAP"  OB History  Gravida Para Term Preterm AB Living  1 1 1   1   SAB IAB Ectopic Multiple Live Births      1    # Outcome Date GA Lbr Len/2nd Weight Sex Type Anes PTL Lv  1 Term      Vag-Spont   LIV    Past Medical History:  Diagnosis Date   Arthritis    oa knees   Cystocele with prolapse    Hyperlipemia    Hypertension    Hypothyroidism    Sleep apnea    cpap set on 25   SUI (stress urinary incontinence, female)    Wears contact lenses     Past Surgical History:  Procedure Laterality Date   ABDOMINAL HYSTERECTOMY     ANTERIOR AND POSTERIOR REPAIR N/A 03/12/2017   Procedure: ANTERIOR (CYSTOCELE) AND POSTERIOR REPAIR (RECTOCELE);  Surgeon: Romualdo Bolk, MD;  Location: [facility];  Service: Gynecology;  Laterality: N/A;   BLADDER SUSPENSION N/A 05/03/2020   Procedure: TRANSVAGINAL TAPE (TVT) PROCEDURE;  Surgeon: Marguerita Beards, MD;  Location: [facility];  Service: Gynecology;  Laterality: N/A;   CARPAL TUNNEL RELEASE Bilateral yrs ago   CHOLECYSTECTOMY  2001   laparoscopic   CYSTOCELE REPAIR N/A 05/03/2020   Procedure: ANTERIOR REPAIR (CYSTOCELE);  Surgeon: Marguerita Beards, MD;  Location[facility];   Service: Gynecology;  Laterality: N/A;  total time needed for 3 procedures is 1.5hrs   CYSTOSCOPY N/A 03/12/2017   Procedure: CYSTOSCOPY;  Surgeon: Romualdo Bolk, MD;  Location: [facility];  Service: Gynecology;  Laterality: N/A;   CYSTOSCOPY N/A 05/03/2020   Procedure: CYSTOSCOPY;  Surgeon: Marguerita Beards, MD;  Location: [facility];  Service: Gynecology;  Laterality: N/A;   KNEE ARTHROSCOPY Left 12/2019   KNEE ARTHROSCOPY WITH MEDIAL MENISECTOMY Right 02/13/2019   Procedure: KNEE ARTHROSCOPY WITH MEDIAL MENISECTOMY;  Surgeon: Juanell Fairly, MD;  Location: [facility];  Service: Orthopedics;  Laterality: Right;   LACRIMAL DUCT PROBING W/ DACRYOPLASTY Bilateral    SALPINGOOPHORECTOMY Left 03/12/2017   Procedure: SALPINGECTOMY;  Surgeon: Romualdo Bolk, MD;  Location: [facility];  Service: Gynecology;  Laterality: Left;   SHOULDER ARTHROSCOPY WITH OPEN ROTATOR CUFF REPAIR Right 09/03/2014   Procedure: SHOULDER ARTHROSCOPY WITH OPEN ROTATOR CUFF REPAIR;  Surgeon: Juanell Fairly, MD;  Location: [facility];  Service: Orthopedics;  Laterality: Right;   THYROIDECTOMY Right yrs ago   hemi-thyroidectomy   TOTAL KNEE ARTHROPLASTY Right 05/20/2019   Procedure: TOTAL KNEE ARTHROPLASTY;  Surgeon: Juanell Fairly, MD;  Location: [facility];  Service: Orthopedics;  Laterality: Right;   TOTAL KNEE ARTHROPLASTY Left 12/23/2019  Procedure: Left TOTAL KNEE ARTHROPLASTY;  Surgeon: Juanell Fairly, MD;  Location: [facility];  Service: Orthopedics;  Laterality: Left;   VAGINAL HYSTERECTOMY N/A 03/12/2017   Procedure: HYSTERECTOMY VAGINAL;  Surgeon: Romualdo Bolk, MD;  Location: [facility];  Service: Gynecology;  Laterality: N/A;    Current Outpatient Medications on File Prior to Visit  Medication Sig Dispense Refill   Chlorpheniramine Maleate (ALLERGY RELIEF PO) Take by mouth.     Cyanocobalamin (B-12 PO) Take by mouth.      donepezil (ARICEPT) 10 MG tablet Take 10 mg by mouth at bedtime.     levothyroxine (SYNTHROID) 88 MCG tablet Take 88 mcg by mouth daily before breakfast.     lisinopril-hydrochlorothiazide (PRINZIDE,ZESTORETIC) 20-25 MG per tablet Take 1 tablet by mouth daily.     rosuvastatin (CRESTOR) 5 MG tablet Take 5 mg by mouth daily at 2 PM. Takes at 200 pm     No current facility-administered medications on file prior to visit.    Social History   Socioeconomic History   Marital status: Married    Spouse name: Not on file   Number of children: Not on file   Years of education: Not on file   Highest education level: Not on file  Occupational History   Not on file  Tobacco Use   Smoking status: Never   Smokeless tobacco: Never  Vaping Use   Vaping status: Never Used  Substance and Sexual Activity   Alcohol use: Yes    Comment: occ    Drug use: No   Sexual activity: Yes    Partners: Male    Birth control/protection: Post-menopausal, Surgical  Other Topics Concern   Not on file  Social History Narrative   Not on file   Social Drivers of Health   Financial Resource Strain: Not on file  Food Insecurity: Not on file  Transportation Needs: Not on file  Physical Activity: Not on file  Stress: Not on file  Social Connections: Not on file  Intimate Partner Violence: Not on file    Family History  Problem Relation Age of Onset   Diabetes type II Father    CAD Father    Alzheimer's disease Father    Alzheimer's disease Mother    Breast cancer Neg Hx      Allergies  Allergen Reactions   Codeine Nausea And Vomiting   Doxycycline Photosensitivity      Patient's last menstrual period was No LMP recorded. Patient has had a hysterectomy..            Review of Systems Alls systems reviewed and are negative.     Physical Exam Constitutional:      Appearance: Normal appearance.  Genitourinary:     Vulva and urethral meatus normal.     No lesions in the vagina.     Right  Labia: No rash, lesions or skin changes.    Left Labia: No lesions, skin changes or rash.       No vaginal discharge or tenderness.     Anterior vaginal prolapse present.    No vaginal atrophy present.     Right Adnexa: absent.    Left Adnexa: absent.    Cervix is absent.     Uterus is absent.  Breasts:    Right: Normal.     Left: Normal.  HENT:     Head: Normocephalic.  Neck:     Thyroid: No thyroid mass, thyromegaly or thyroid tenderness.  Cardiovascular:  Rate and Rhythm: Normal rate and regular rhythm.     Heart sounds: Normal heart sounds, S1 normal and S2 normal.  Pulmonary:     Effort: Pulmonary effort is normal.     Breath sounds: Normal breath sounds and air entry.  Abdominal:     General: There is no distension.     Palpations: Abdomen is soft. There is no mass.     Tenderness: There is no abdominal tenderness. There is no guarding or rebound.  Musculoskeletal:        General: Normal range of motion.     Cervical back: Full passive range of motion without pain, normal range of motion and neck supple. No tenderness.     Right lower leg: No edema.     Left lower leg: No edema.  Neurological:     Mental Status: She is alert.  Skin:    General: Skin is warm.  Psychiatric:        Mood and Affect: Mood normal.        Behavior: Behavior normal.        Thought Content: Thought content normal.  Vitals and nursing note reviewed. Exam conducted with a chaperone present.       A:         Well Woman GYN exam                             P:        Pap smear not indicated Encouraged annual mammogram screening Colon cancer screening up-to-date DXA ordered today Labs and immunizations to do with PMD Referral to urogyn for urinary incontinence and prolapse RTC for punch biopsy for right labia and introitus for hypopigmented areas Encouraged healthy lifestyle practices Encouraged Vit D and Calcium  Referral to dermatology for mole/body check No follow-ups on  file.  Earley Favor

## 2023-04-17 ENCOUNTER — Telehealth: Payer: Self-pay | Admitting: Obstetrics and Gynecology

## 2023-04-17 ENCOUNTER — Other Ambulatory Visit: Payer: Self-pay | Admitting: *Deleted

## 2023-04-17 ENCOUNTER — Other Ambulatory Visit: Payer: Self-pay | Admitting: Obstetrics and Gynecology

## 2023-04-17 MED ORDER — ESTRADIOL 0.025 MG/24HR TD PTTW: 1.0000 | MEDICATED_PATCH | TRANSDERMAL | 12 refills | Status: DC

## 2023-04-17 NOTE — Telephone Encounter (Signed)
 Interface, Surescripts Out  [employer] Rx Refill An error occurred while processing the e-prescribing message.  The message was not sent electronically to the requested pharmacy. Contact the pharmacy about the new prescription.  Code: 900 - Transaction rejected Description Code: Code: 220 - Transaction is a Engineer, mining rejected transaction with an error

## 2023-04-17 NOTE — Telephone Encounter (Signed)
No I haven't received anything

## 2023-04-17 NOTE — Telephone Encounter (Signed)
 Spoke with patient, advised per Dr. Karma Greaser. Patient states pharmacy will be reaching out to discuss. Patient is unsure what clarification pharmacy needs. Advised our office will f/u with pharmacy and update Dr. Karma Greaser. Advised if PA needed, can take 5 business days for response once received. Patient is unsure at this time if she wants the patch or intrarosa. Will await update.   Joy -have you received anything from [facility]?

## 2023-04-17 NOTE — Telephone Encounter (Signed)
 Patient left message on triage line. Seen in office on 04/16/23, would like [facility] in [place].    Routing to Dr. Karma Greaser to review and advise.

## 2023-04-17 NOTE — Telephone Encounter (Signed)
 Doti prescription

## 2023-04-18 NOTE — Telephone Encounter (Signed)
 Call placed to [facility], pharmacy closed for lunch, left message requesting return call with update on estradiol 0.025 mg patch Rx. Return call to [facility] to provide update on Rx, [phone], opt 4.

## 2023-04-23 MED ORDER — ESTRADIOL 0.025 MG/24HR TD PTWK: 0.0250 mg | MEDICATED_PATCH | TRANSDERMAL | 12 refills | Status: DC

## 2023-04-23 NOTE — Telephone Encounter (Signed)
 Spoke with Fleet Contras at [facility], was advised Estradiol not covered by plan. Will send fax request for PA or alternative.   Per review of EPIC, second refill request sent and denied on 04/17/23, Climara patch offered as alternative.   Dr. Karma Greaser -please advise on Climara patch Rx.

## 2023-04-23 NOTE — Telephone Encounter (Signed)
 Patient notified of new Rx. Patient picked up medication today. No additional questions at this time.   Encounter closed.

## 2023-04-23 NOTE — Addendum Note (Signed)
 Addended by: Leda Min on: 04/23/2023 09:20 AM   Modules accepted: Orders

## 2023-04-24 NOTE — Telephone Encounter (Signed)
 Per pharmacy on 04/17/2023: Dotti patches were not covered and the pharmacy requested an alternative.  EB sent in climara patches on 04/23/2023.   Received a paper fax also about this on 04/23/2023.  Spoke w/ Greig Castilla at the [facility] and he said that they didn't receive the alternative. It must have been sent to an alternate pharmacy. However, was sent for once a week when the dotti was originally sent for twice weekly.   Please advise.

## 2023-05-08 ENCOUNTER — Other Ambulatory Visit (INDEPENDENT_AMBULATORY_CARE_PROVIDER_SITE_OTHER): Payer: Self-pay | Admitting: Nurse Practitioner

## 2023-05-08 DIAGNOSIS — I83899 Varicose veins of unspecified lower extremities with other complications: Secondary | ICD-10-CM

## 2023-05-09 ENCOUNTER — Encounter (INDEPENDENT_AMBULATORY_CARE_PROVIDER_SITE_OTHER): Payer: Self-pay | Admitting: Nurse Practitioner

## 2023-05-09 ENCOUNTER — Ambulatory Visit (INDEPENDENT_AMBULATORY_CARE_PROVIDER_SITE_OTHER): Payer: Medicare Other | Admitting: Nurse Practitioner

## 2023-05-09 ENCOUNTER — Ambulatory Visit (INDEPENDENT_AMBULATORY_CARE_PROVIDER_SITE_OTHER): Payer: Medicare Other

## 2023-05-09 VITALS — BP 137/71 | HR 66 | Resp 18 | Ht 61.0 in | Wt 176.4 lb

## 2023-05-09 DIAGNOSIS — I83892 Varicose veins of left lower extremities with other complications: Secondary | ICD-10-CM

## 2023-05-09 DIAGNOSIS — I10 Essential (primary) hypertension: Secondary | ICD-10-CM | POA: Diagnosis not present

## 2023-05-09 DIAGNOSIS — E782 Mixed hyperlipidemia: Secondary | ICD-10-CM

## 2023-05-09 DIAGNOSIS — I83899 Varicose veins of unspecified lower extremities with other complications: Secondary | ICD-10-CM

## 2023-05-09 NOTE — Patient Instructions (Signed)
 Endovenous Ablation Endovenous ablation is a procedure that seals off an abnormally enlarged leg vein (varicose vein). This procedure uses heat from radiofrequency waves or a laser to close off the affected vein. This procedure leaves the vein in place, so there is minimal pain and bruising. Closing off the vein can help reduce symptoms by preventing the pooling of blood that causes varicose veins. This procedure may be done if the vein is causing pain, swelling, sores on the skin (ulcers), or skin discoloration. Tell a health care provider about: Any allergies you have. All medicines you are taking, including vitamins, herbs, eye drops, creams, and over-the-counter medicines. Any problems you or family members have had with anesthetic medicines. Any bleeding problems you have. Any surgeries you have had. Any medical conditions you have or have had. Whether you are pregnant or may be pregnant. What are the risks? Generally, this is a safe procedure. However, problems may occur, including: Infection. Bleeding. Allergic reactions to medicines. Damage to nearby structures. Numbness or tingling along the leg. This is uncommon, and it is usually temporary. Vein swelling. This is usually temporary. Blood clots that form in a deep vein of the leg (deep vein thrombosis, or DVT) and can travel to the lungs (pulmonary embolism, or PE). This is very rare. What happens before the procedure? When to stop eating and drinking Follow instructions from your health care provider about what you may eat and drink before your procedure. These may include: 8 hours before your procedure Stop eating most foods. Do not eat meat, fried foods, or fatty foods. Eat only light foods, such as toast or crackers. All liquids are okay except energy drinks and alcohol. 6 hours before your procedure Stop eating. Drink only clear liquids, such as water, clear fruit juice, black coffee, plain tea, and sports drinks. Do not  drink energy drinks or alcohol. 2 hours before the procedure Stop drinking all liquids. You may be allowed to take medicines with small sips of water. If you do not follow your health care provider's instructions, your procedure may be delayed or canceled. Medicines Ask your health care provider about: Changing or stopping your regular medicines. This is especially important if you are taking diabetes medicines or blood thinners. Taking medicines such as aspirin and ibuprofen. These medicines can thin your blood. Do not take these medicines unless your health care provider tells you to take them. Taking over-the-counter medicines, vitamins, herbs, and supplements. General instructions You may have blood tests to make sure that your blood can clot normally. Do not use any products that contain nicotine or tobacco for at least 4 weeks before the procedure. These products include cigarettes, chewing tobacco, and vaping devices, such as e-cigarettes. If you need help quitting, ask your health care provider. Plan to have a responsible adult take you home from the hospital or clinic. If you will be going home right after the procedure, plan to have a responsible adult care for you for the time you are told. This is important. Ask your health care provider what steps will be taken to help prevent infection. These may include: Removing hair at the procedure site. Washing skin with a germ-killing soap. What happens during the procedure?  You will lie on an exam table. An IV will be inserted into one of your veins. You will be given one or more of the following: A medicine to help you relax (sedative). A medicine to numb the area (local anesthetic). A medicine to make you fall  asleep (general anesthetic). Your health care provider will use an imaging tool that uses sound waves (ultrasound) to show images of your leg veins and to mark the skin over the target treatment vein. A small incision will be  made near the area that will be treated. A thin tube (catheter) will be slipped through the incision and into the vein. Your health care provider will inject a solution of salt water and anesthetic agent along the length of the vein to be treated. Electrodes or laser fibers will be passed through the catheter and into the vein. Radiofrequency or laser energy will be sent through the electrodes or laser fibers to burn the vein. This seals off the vein. The electrodes, laser fibers, and catheter will be removed from the vein. A bandage (dressing) will be placed over the incision. The procedure may vary among health care providers and hospitals. What happens after the procedure? Your blood pressure, heart rate, breathing rate, and blood oxygen level may be monitored until you leave the hospital or clinic. You may have to wear compression stockings. These stockings help to prevent blood clots and reduce swelling in your legs. You will be encouraged to walk around right after the procedure. If you were given a sedative during the procedure, it can affect you for several hours. Do not drive or operate machinery until your health care provider says that it is safe. Summary Endovenous ablation is a procedure that seals off an abnormally enlarged leg vein (varicose vein). Follow instructions from your health care provider about changing or stopping your medicines. This is especially important if you are taking diabetes medicines or blood thinners. Follow instructions from your health care provider about eating and drinking before the procedure. Plan to have a responsible adult take you home from the hospital or clinic. After the procedure, you may have to wear compression stockings, and you will be encouraged to walk around right away. This information is not intended to replace advice given to you by your health care provider. Make sure you discuss any questions you have with your health care  provider. Document Revised: 06/24/2020 Document Reviewed: 06/24/2020 Elsevier Patient Education  2024 [employer].

## 2023-05-10 ENCOUNTER — Ambulatory Visit: Admitting: Obstetrics and Gynecology

## 2023-05-10 ENCOUNTER — Encounter (INDEPENDENT_AMBULATORY_CARE_PROVIDER_SITE_OTHER): Payer: Self-pay | Admitting: Nurse Practitioner

## 2023-05-10 NOTE — Progress Notes (Signed)
 Subjective:    Patient ID: Jordan Palmer, female    DOB: August 21, 1950, 73 y.o.   MRN: [id number] Chief Complaint  Patient presents with   New Patient (Initial Visit)    Ref [place] consult ble vv with bleeding    The patient is seen for evaluation of symptomatic varicose veins.  The patient notes that several weeks ago she had a spontaneous bleed from a varicosity on her left lower extremity.  It was significant she required emergent attention.  Today the area is healed but the patient is still understandably concerned that it may recur.  She notes that during this time she was also having some significant swelling in in the area.  She also has concerns about the stasis dermatitis that is on her leg as well.  The patient also notes an aching and throbbing pain over the varicosities, particularly with prolonged dependent positions. The symptoms are significantly improved with elevation.  The patient also notes that during hot weather the symptoms are greatly intensified. The patient states the pain from the varicose veins interferes with work, daily exercise, shopping and household maintenance. At this point, the symptoms are persistent and severe enough that they're having a negative impact on lifestyle and are interfering with daily activities.  There is no history of DVT, PE or superficial thrombophlebitis. There is no history of ulceration or hemorrhage. The patient denies a significant family history of varicose veins.  The patient has  worn graduated compression in the past. At the present time the patient has  been using over-the-counter analgesics. There is no history of prior surgical intervention or sclerotherapy.  She was given instructions including use of medical grade compression, elevation activity and she has been doing this since her hospitalization.  She notes that it has been improving some of her swelling but is not elevated at all all.      Review of Systems  Hematological:   Bruises/bleeds easily.  All other systems reviewed and are negative.      Objective:   Physical Exam Vitals reviewed.  HENT:     Head: Normocephalic.  Cardiovascular:     Rate and Rhythm: Normal rate.     Pulses: Normal pulses.  Pulmonary:     Effort: Pulmonary effort is normal.  Musculoskeletal:     Left lower leg: Edema present.  Skin:    General: Skin is warm and dry.  Neurological:     Mental Status: She is alert and oriented to person, place, and time.  Psychiatric:        Mood and Affect: Mood normal.        Behavior: Behavior normal.        Thought Content: Thought content normal.        Judgment: Judgment normal.     BP 137/71   Pulse 66   Resp 18   Ht 5\' 1"  (1.549 m)   Wt 176 lb 6.4 oz (80 kg)   BMI 33.33 kg/m   Past Medical History:  Diagnosis Date   Arthritis    oa knees   Cystocele with prolapse    Hyperlipemia    Hypertension    Hypothyroidism    Sleep apnea    cpap set on 25   SUI (stress urinary incontinence, female)    Wears contact lenses     Social History   Socioeconomic History   Marital status: Married    Spouse name: Not on file   Number of children: Not  on file   Years of education: Not on file   Highest education level: Not on file  Occupational History   Not on file  Tobacco Use   Smoking status: Never   Smokeless tobacco: Never  Vaping Use   Vaping status: Never Used  Substance and Sexual Activity   Alcohol use: Yes    Comment: occ    Drug use: No   Sexual activity: Yes    Partners: Male    Birth control/protection: Post-menopausal, Surgical  Other Topics Concern   Not on file  Social History Narrative   Not on file   Social Drivers of Health   Financial Resource Strain: Low Risk  (05/05/2023)   Received from [facility]   Overall Financial Resource Strain (CARDIA)    Difficulty of Paying Living Expenses: Not hard at all  Food Insecurity: No Food Insecurity (05/05/2023)   Received from [facility]   Hunger Vital Sign    Worried About Running Out of Food in the Last Year: Never true    Ran Out of Food in the Last Year: Never true  Transportation Needs: No Transportation Needs (05/05/2023)   Received from [facility] - Transportation    In the past 12 months, has lack of transportation kept you from medical appointments or from getting medications?: No    Lack of Transportation (Non-Medical): No  Physical Activity: Not on file  Stress: Not on file  Social Connections: Not on file  Intimate Partner Violence: Not on file    Past Surgical History:  Procedure Laterality Date   ABDOMINAL HYSTERECTOMY     ANTERIOR AND POSTERIOR REPAIR N/A 03/12/2017   Procedure: ANTERIOR (CYSTOCELE) AND POSTERIOR REPAIR (RECTOCELE);  Surgeon: Romualdo Bolk, MD;  Location: [facility];  Service: Gynecology;  Laterality: N/A;   BLADDER SUSPENSION N/A 05/03/2020   Procedure: TRANSVAGINAL TAPE (TVT) PROCEDURE;  Surgeon: Marguerita Beards, MD;  Location: [facility];  Service: Gynecology;  Laterality: N/A;   CARPAL TUNNEL RELEASE Bilateral yrs ago   CHOLECYSTECTOMY  2001   laparoscopic   CYSTOCELE REPAIR N/A 05/03/2020   Procedure: ANTERIOR REPAIR (CYSTOCELE);  Surgeon: Marguerita Beards, MD;  Location: [facility];  Service: Gynecology;  Laterality: N/A;  total time needed for 3 procedures is 1.5hrs   CYSTOSCOPY N/A 03/12/2017   Procedure: CYSTOSCOPY;  Surgeon: Romualdo Bolk, MD;  Location: [facility];  Service: Gynecology;  Laterality: N/A;   CYSTOSCOPY N/A 05/03/2020   Procedure: CYSTOSCOPY;  Surgeon: Marguerita Beards, MD;  Location: [facility];  Service: Gynecology;  Laterality: N/A;   KNEE ARTHROSCOPY Left 12/2019   KNEE ARTHROSCOPY WITH MEDIAL MENISECTOMY Right 02/13/2019   Procedure: KNEE ARTHROSCOPY WITH MEDIAL MENISECTOMY;  Surgeon: Juanell Fairly, MD;   Location: [facility];  Service: Orthopedics;  Laterality: Right;   LACRIMAL DUCT PROBING W/ DACRYOPLASTY Bilateral    SALPINGOOPHORECTOMY Left 03/12/2017   Procedure: SALPINGECTOMY;  Surgeon: Romualdo Bolk, MD;  Location: [facility];  Service: Gynecology;  Laterality: Left;   SHOULDER ARTHROSCOPY WITH OPEN ROTATOR CUFF REPAIR Right 09/03/2014   Procedure: SHOULDER ARTHROSCOPY WITH OPEN ROTATOR CUFF REPAIR;  Surgeon: Juanell Fairly, MD;  Location: [facility];  Service: Orthopedics;  Laterality: Right;   THYROIDECTOMY Right yrs ago   hemi-thyroidectomy   TOTAL KNEE ARTHROPLASTY Right 05/20/2019   Procedure: TOTAL KNEE ARTHROPLASTY;  Surgeon: Juanell Fairly, MD;  Location:  [facility];  Service: Orthopedics;  Laterality: Right;   TOTAL KNEE ARTHROPLASTY Left 12/23/2019   Procedure: Left TOTAL KNEE ARTHROPLASTY;  Surgeon: Juanell Fairly, MD;  Location: [facility];  Service: Orthopedics;  Laterality: Left;   VAGINAL HYSTERECTOMY N/A 03/12/2017   Procedure: HYSTERECTOMY VAGINAL;  Surgeon: Romualdo Bolk, MD;  Location: [facility];  Service: Gynecology;  Laterality: N/A;    Family History  Problem Relation Age of Onset   Diabetes type II Father    CAD Father    Alzheimer's disease Father    Alzheimer's disease Mother    Breast cancer Neg Hx     Allergies  Allergen Reactions   Codeine Nausea And Vomiting   Doxycycline Photosensitivity       Latest Ref Rng & Units 03/28/2023    8:40 AM 12/24/2019    5:27 AM 12/19/2019    2:35 PM  CBC  WBC 4.0 - 10.5 K/uL 6.1  5.2  5.5   Hemoglobin 12.0 - 15.0 g/dL 65.7  9.8  84.6   Hematocrit 36.0 - 46.0 % 32.8  29.1  32.0   Platelets 150 - 400 K/uL 250  183  235       CMP     Component Value Date/Time   NA 139 03/28/2023 0840   K 3.6 03/28/2023 0840   CL 104 03/28/2023 0840   CO2 23 03/28/2023 0840   GLUCOSE 115 (H) 03/28/2023 0840   BUN 24 (H) 03/28/2023 0840   CREATININE 0.90 03/28/2023 0840    CALCIUM 9.5 03/28/2023 0840   PROT 7.9 03/05/2017 0942   ALBUMIN 4.4 03/05/2017 0942   AST 25 03/05/2017 0942   ALT 22 03/05/2017 0942   ALKPHOS 53 03/05/2017 0942   BILITOT 0.3 03/05/2017 0942   GFRNONAA >60 03/28/2023 0840     No results found.     Assessment & Plan:   1. Bleeding from varicose veins of left lower extremity (Primary) Recommend  I have reviewed my previous  discussion with the patient regarding  varicose veins and why they cause symptoms. Patient will continue  wearing graduated compression stockings class 1 on a daily basis, beginning first thing in the morning and removing them in the evening.  The patient is CEAP [id number].  The patient has been wearing compression for more than 12 weeks with no or little benefit.  The patient has been exercising daily for more than 12 weeks. The patient has been elevating and taking OTC pain medications for more than 12 weeks.  None of these have have eliminated the pain related to the varicose veins and venous reflux or the discomfort regarding venous congestion.    In addition, behavioral modification including elevation during the day was again discussed and this will continue.  The patient has utilized over the counter pain medications and has been exercising.  However, at this time conservative therapy has not alleviated the patient's symptoms of leg pain and swelling  Recommend: laser ablation of the left great saphenous veins to eliminate the symptoms of pain and swelling of the lower extremities caused by the severe superficial venous reflux disease.   2. Essential hypertension Continue antihypertensive medications as already ordered, these medications have been reviewed and there are no changes at this time.  3. Mixed dyslipidemia Continue statin as ordered and reviewed, no changes at this time   Current Outpatient Medications on File Prior to Visit  Medication Sig Dispense Refill   Chlorpheniramine Maleate  (ALLERGY RELIEF PO) Take  by mouth.     Cyanocobalamin (B-12 PO) Take by mouth.     donepezil (ARICEPT) 10 MG tablet Take 10 mg by mouth at bedtime.     estradiol (CLIMARA) 0.025 mg/24hr patch Place 1 patch (0.025 mg total) onto the skin once a week. 4 patch 12   levothyroxine (SYNTHROID) 88 MCG tablet Take 88 mcg by mouth daily before breakfast.     lisinopril-hydrochlorothiazide (PRINZIDE,ZESTORETIC) 20-25 MG per tablet Take 1 tablet by mouth daily.     memantine (NAMENDA) 5 MG tablet Take 5 mg by mouth daily.     rosuvastatin (CRESTOR) 5 MG tablet Take 5 mg by mouth daily at 2 PM. Takes at 200 pm     No current facility-administered medications on file prior to visit.    Patient Instructions   Endovenous Ablation Endovenous ablation is a procedure that seals off an abnormally enlarged leg vein (varicose vein). This procedure uses heat from radiofrequency waves or a laser to close off the affected vein. This procedure leaves the vein in place, so there is minimal pain and bruising. Closing off the vein can help reduce symptoms by preventing the pooling of blood that causes varicose veins. This procedure may be done if the vein is causing pain, swelling, sores on the skin (ulcers), or skin discoloration. Tell a health care provider about: Any allergies you have. All medicines you are taking, including vitamins, herbs, eye drops, creams, and over-the-counter medicines. Any problems you or family members have had with anesthetic medicines. Any bleeding problems you have. Any surgeries you have had. Any medical conditions you have or have had. Whether you are pregnant or may be pregnant. What are the risks? Generally, this is a safe procedure. However, problems may occur, including: Infection. Bleeding. Allergic reactions to medicines. Damage to nearby structures. Numbness or tingling along the leg. This is uncommon, and it is usually temporary. Vein swelling. This is usually  temporary. Blood clots that form in a deep vein of the leg (deep vein thrombosis, or DVT) and can travel to the lungs (pulmonary embolism, or PE). This is very rare. What happens before the procedure? When to stop eating and drinking Follow instructions from your health care provider about what you may eat and drink before your procedure. These may include: 8 hours before your procedure Stop eating most foods. Do not eat meat, fried foods, or fatty foods. Eat only light foods, such as toast or crackers. All liquids are okay except energy drinks and alcohol. 6 hours before your procedure Stop eating. Drink only clear liquids, such as water, clear fruit juice, black coffee, plain tea, and sports drinks. Do not drink energy drinks or alcohol. 2 hours before the procedure Stop drinking all liquids. You may be allowed to take medicines with small sips of water. If you do not follow your health care provider's instructions, your procedure may be delayed or canceled. Medicines Ask your health care provider about: Changing or stopping your regular medicines. This is especially important if you are taking diabetes medicines or blood thinners. Taking medicines such as aspirin and ibuprofen. These medicines can thin your blood. Do not take these medicines unless your health care provider tells you to take them. Taking over-the-counter medicines, vitamins, herbs, and supplements. General instructions You may have blood tests to make sure that your blood can clot normally. Do not use any products that contain nicotine or tobacco for at least 4 weeks before the procedure. These products include cigarettes, chewing tobacco, and  vaping devices, such as e-cigarettes. If you need help quitting, ask your health care provider. Plan to have a responsible adult take you home from the hospital or clinic. If you will be going home right after the procedure, plan to have a responsible adult care for you for the  time you are told. This is important. Ask your health care provider what steps will be taken to help prevent infection. These may include: Removing hair at the procedure site. Washing skin with a germ-killing soap. What happens during the procedure?  You will lie on an exam table. An IV will be inserted into one of your veins. You will be given one or more of the following: A medicine to help you relax (sedative). A medicine to numb the area (local anesthetic). A medicine to make you fall asleep (general anesthetic). Your health care provider will use an imaging tool that uses sound waves (ultrasound) to show images of your leg veins and to mark the skin over the target treatment vein. A small incision will be made near the area that will be treated. A thin tube (catheter) will be slipped through the incision and into the vein. Your health care provider will inject a solution of salt water and anesthetic agent along the length of the vein to be treated. Electrodes or laser fibers will be passed through the catheter and into the vein. Radiofrequency or laser energy will be sent through the electrodes or laser fibers to burn the vein. This seals off the vein. The electrodes, laser fibers, and catheter will be removed from the vein. A bandage (dressing) will be placed over the incision. The procedure may vary among health care providers and hospitals. What happens after the procedure? Your blood pressure, heart rate, breathing rate, and blood oxygen level may be monitored until you leave the hospital or clinic. You may have to wear compression stockings. These stockings help to prevent blood clots and reduce swelling in your legs. You will be encouraged to walk around right after the procedure. If you were given a sedative during the procedure, it can affect you for several hours. Do not drive or operate machinery until your health care provider says that it is safe. Summary Endovenous  ablation is a procedure that seals off an abnormally enlarged leg vein (varicose vein). Follow instructions from your health care provider about changing or stopping your medicines. This is especially important if you are taking diabetes medicines or blood thinners. Follow instructions from your health care provider about eating and drinking before the procedure. Plan to have a responsible adult take you home from the hospital or clinic. After the procedure, you may have to wear compression stockings, and you will be encouraged to walk around right away. This information is not intended to replace advice given to you by your health care provider. Make sure you discuss any questions you have with your health care provider. Document Revised: 06/24/2020 Document Reviewed: 06/24/2020 Elsevier Patient Education  2024 Elsevier Inc.   No follow-ups on file.   Georgiana Spinner, NP

## 2023-05-23 ENCOUNTER — Ambulatory Visit: Admitting: Obstetrics and Gynecology

## 2023-05-23 ENCOUNTER — Encounter: Payer: Self-pay | Admitting: Obstetrics and Gynecology

## 2023-05-23 ENCOUNTER — Other Ambulatory Visit (HOSPITAL_COMMUNITY)
Admission: RE | Admit: 2023-05-23 | Discharge: 2023-05-23 | Disposition: A | Discharge status: Home / Self Care | Source: Ambulatory Visit | Attending: Obstetrics and Gynecology | Admitting: Obstetrics and Gynecology

## 2023-05-23 VITALS — BP 114/64 | HR 61

## 2023-05-23 DIAGNOSIS — L819 Disorder of pigmentation, unspecified: Secondary | ICD-10-CM

## 2023-05-23 DIAGNOSIS — Z01419 Encounter for gynecological examination (general) (routine) without abnormal findings: Secondary | ICD-10-CM

## 2023-05-23 DIAGNOSIS — N909 Noninflammatory disorder of vulva and perineum, unspecified: Secondary | ICD-10-CM

## 2023-05-23 MED ORDER — ESTRADIOL 0.1 MG/GM VA CREA: 1.0000 | TOPICAL_CREAM | Freq: Every day | VAGINAL | 0 refills | Status: AC

## 2023-05-23 MED ORDER — NYSTATIN 100000 UNIT/GM EX POWD: 1.0000 | Freq: Three times a day (TID) | CUTANEOUS | 3 refills | Status: AC

## 2023-05-23 NOTE — Progress Notes (Signed)
   Acute Office Visit  Subjective:    Patient ID: Jordan Palmer, female    DOB: 25-Oct-1950, 73 y.o.   MRN: [id number]   HPI 73 y.o. presents today for vulvar biopsy (Vulvar biopsy//jj) .  No LMP recorded. Patient has had a hysterectomy.    Review of Systems     Objective:    Physical Exam Genitourinary:        BP 114/64   Pulse 61   SpO2 98%  Wt Readings from Last 3 Encounters:  05/09/23 176 lb 6.4 oz (80 kg)  04/16/23 172 lb (78 kg)  03/28/23 172 lb (78 kg)      Procedure: Punch biopsy Patient consented for the procedure with written consent. Time out performed.  The area was cleaned with betadine .  1 cc of lidocaine  with epi was injected subcutaneously.  A 3 mm punch biopsy was used and the biopsy was then lifted and cut with the scissors and sent to pathology.  Hemostasis was achieved with silver nitrate.  Patient tolerated the procedure well.  Post care instructions were discussed with patient.     Joy, CMA was present for the procedure  Assessment & Plan:  Punch biopsy collected. Counseled on the importance of the biopsy to rule out underlying cancer associated with LS.  She agreed.  To notify patient of the results. Post care instructions discussed with patient: to begin at bedtime dime size amount of estrogen for 3 weeks then 3 times a week to the area. Can also use neosporin  for a week with aloe. Concerning s/s discussed with the patient. To use nystatin  powder in groin for yeast infection RTC with any concerns  Dr. Caro Christmas

## 2023-05-29 ENCOUNTER — Encounter: Payer: Self-pay | Admitting: Obstetrics and Gynecology

## 2023-05-29 LAB — SURGICAL PATHOLOGY

## 2023-06-06 ENCOUNTER — Encounter (HOSPITAL_COMMUNITY): Payer: Self-pay

## 2023-06-13 ENCOUNTER — Other Ambulatory Visit

## 2023-06-13 ENCOUNTER — Ambulatory Visit

## 2023-06-19 ENCOUNTER — Encounter (INDEPENDENT_AMBULATORY_CARE_PROVIDER_SITE_OTHER): Payer: Self-pay

## 2023-06-20 ENCOUNTER — Other Ambulatory Visit: Payer: Self-pay

## 2023-06-20 ENCOUNTER — Encounter: Payer: Self-pay | Admitting: Emergency Medicine

## 2023-06-20 ENCOUNTER — Emergency Department
Admission: EM | Admit: 2023-06-20 | Discharge: 2023-06-20 | Disposition: A | Discharge status: Home / Self Care | Attending: Emergency Medicine | Admitting: Emergency Medicine

## 2023-06-20 ENCOUNTER — Emergency Department

## 2023-06-20 DIAGNOSIS — R1030 Lower abdominal pain, unspecified: Secondary | ICD-10-CM | POA: Insufficient documentation

## 2023-06-20 DIAGNOSIS — I10 Essential (primary) hypertension: Secondary | ICD-10-CM | POA: Insufficient documentation

## 2023-06-20 DIAGNOSIS — R109 Unspecified abdominal pain: Secondary | ICD-10-CM

## 2023-06-20 LAB — CBC
HCT: 38.1 % (ref 36.0–46.0)
Hemoglobin: 12.9 g/dL (ref 12.0–15.0)
MCH: 32.7 pg (ref 26.0–34.0)
MCHC: 33.9 g/dL (ref 30.0–36.0)
MCV: 96.7 fL (ref 80.0–100.0)
Platelets: 247 10*3/uL (ref 150–400)
RBC: 3.94 MIL/uL (ref 3.87–5.11)
RDW: 13 % (ref 11.5–15.5)
WBC: 8.9 10*3/uL (ref 4.0–10.5)
nRBC: 0 % (ref 0.0–0.2)

## 2023-06-20 LAB — COMPREHENSIVE METABOLIC PANEL WITH GFR
ALT: 23 U/L (ref 0–44)
AST: 29 U/L (ref 15–41)
Albumin: 4.5 g/dL (ref 3.5–5.0)
Alkaline Phosphatase: 49 U/L (ref 38–126)
Anion gap: 12 (ref 5–15)
BUN: 25 mg/dL — ABNORMAL HIGH (ref 8–23)
CO2: 23 mmol/L (ref 22–32)
Calcium: 9.7 mg/dL (ref 8.9–10.3)
Chloride: 102 mmol/L (ref 98–111)
Creatinine, Ser: 0.9 mg/dL (ref 0.44–1.00)
GFR, Estimated: >60 mL/min (ref >60)
Glucose, Bld: 128 mg/dL — ABNORMAL HIGH (ref 70–99)
Potassium: 3.8 mmol/L (ref 3.5–5.1)
Sodium: 137 mmol/L (ref 135–145)
Total Bilirubin: 0.8 mg/dL (ref 0.0–1.2)
Total Protein: 8.1 g/dL (ref 6.5–8.1)

## 2023-06-20 LAB — URINALYSIS, ROUTINE W REFLEX MICROSCOPIC
Bacteria, UA: NONE SEEN
Bilirubin Urine: NEGATIVE
Glucose, UA: NEGATIVE mg/dL
Ketones, ur: 5 mg/dL — AB
Leukocytes,Ua: NEGATIVE
Nitrite: NEGATIVE
Protein, ur: NEGATIVE mg/dL
Specific Gravity, Urine: 1.042 — ABNORMAL HIGH (ref 1.005–1.030)
pH: 5 (ref 5.0–8.0)

## 2023-06-20 LAB — LIPASE, BLOOD: Lipase: 32 U/L (ref 11–51)

## 2023-06-20 MED ORDER — ONDANSETRON HCL 4 MG PO TABS: 4.0000 mg | ORAL_TABLET | Freq: Three times a day (TID) | ORAL | 0 refills | Status: DC | PRN

## 2023-06-20 MED ORDER — IOHEXOL 300 MG/ML  SOLN
100.0000 mL | Freq: Once | INTRAMUSCULAR | Status: AC | PRN
Start: 2023-06-20 — End: 2023-06-20
  Administered 2023-06-20: 100 mL via INTRAVENOUS

## 2023-06-20 MED ORDER — ONDANSETRON HCL 4 MG/2ML IJ SOLN
4.0000 mg | Freq: Once | INTRAMUSCULAR | Status: AC
Start: 2023-06-20 — End: 2023-06-20
  Administered 2023-06-20: 4 mg via INTRAVENOUS
  Filled 2023-06-20: qty 2

## 2023-06-20 MED ORDER — SODIUM CHLORIDE 0.9 % IV BOLUS
1000.0000 mL | Freq: Once | INTRAVENOUS | Status: AC
  Administered 2023-06-20: 1000 mL via INTRAVENOUS

## 2023-06-20 NOTE — Discharge Instructions (Signed)
 Your workup today was all largely reassuring with normal labs and a normal CT scan of your belly.  I suspect this will likely be self-limiting and resolve on its own but I have sent nausea medication for you to take as needed if you have any recurrence of symptoms.

## 2023-06-20 NOTE — ED Provider Notes (Signed)
  Physical Exam  BP (!) 160/75   Pulse 76   Temp (!) 97.5 F (36.4 C) (Oral)   Resp 17   Ht 5' (1.524 m)   Wt 79.4 kg   SpO2 99%   BMI 34.18 kg/m   Physical Exam  Procedures  Procedures  ED Course / MDM    Medical Decision Making Amount and/or Complexity of Data Reviewed Labs: ordered. Radiology: ordered.  Risk Prescription drug management.   Received signout on patient.  73 year old female presenting today for lower abdominal pain associated with nausea.  No other acute complaints.  Seen by initial provider with mostly reassuring exam and vital signs stable.  Laboratory workup with unremarkable CBC, lipase, CMP.  Patient had received 1 L of fluids and Zofran .  Signed out pending results of CT abdomen/pelvis.  CT abdomen/pelvis with no acute pathology.  UA negative.  Patient otherwise safe for discharge and feeling well at this time.  Likely self-limiting process but have sent as needed Zofran  home with her.     Kandee Orion, MD 06/20/23 [phone]

## 2023-06-20 NOTE — ED Triage Notes (Signed)
 Patient to ED via POV for generalized abd pain with nausea. Last BM this AM. Denies vomiting or diarrhea.

## 2023-06-20 NOTE — ED Provider Notes (Signed)
 [facility] Provider Note    Event Date/Time   First MD Initiated Contact with Patient 06/20/23 1321     (approximate)  History   Chief Complaint: Abdominal Pain  HPI  Jordan Palmer is a 73 y.o. female with a past medical history arthritis, hypertension, hyperlipidemia, presents to the emergency department for abdominal pain.  According to the patient since earlier this morning she has been experiencing pain across the lower abdomen.  Patient describes as a dull aching pain across the lower abdomen associated with nausea.  Patient has not vomited.  Denies any urinary symptoms.  Denies any fever.  No history of colitis or diverticulitis previously.   Physical Exam   Triage Vital Signs: ED Triage Vitals  Encounter Vitals Group     BP 06/20/23 1228 (!) 160/75     Systolic BP Percentile --      Diastolic BP Percentile --      Pulse Rate 06/20/23 1228 76     Resp 06/20/23 1228 17     Temp 06/20/23 1228 (!) 97.5 F (36.4 C)     Temp Source 06/20/23 1228 Oral     SpO2 06/20/23 1228 99 %     Weight 06/20/23 1228 175 lb (79.4 kg)     Height 06/20/23 1228 5' (1.524 m)     Head Circumference --      Peak Flow --      Pain Score 06/20/23 1227 6     Pain Loc --      Pain Education --      Exclude from Growth Chart --     Most recent vital signs: Vitals:   06/20/23 1228  BP: (!) 160/75  Pulse: 76  Resp: 17  Temp: (!) 97.5 F (36.4 C)  SpO2: 99%    General: Awake, no distress.  CV:  Good peripheral perfusion.  Regular rate and rhythm  Resp:  Normal effort.  Equal breath sounds bilaterally.  Abd:  No distention.  Soft, mild diffuse tenderness across the lower abdomen.  No rebound or guarding.  No distention.  ED Results / Procedures / Treatments   RADIOLOGY  CT pending   MEDICATIONS ORDERED IN ED: Medications  sodium chloride  0.9 % bolus 1,000 mL (has no administration in time range)  ondansetron  (ZOFRAN ) injection 4 mg (has no  administration in time range)     IMPRESSION / MDM / ASSESSMENT AND PLAN / ED COURSE  I reviewed the triage vital signs and the nursing notes.  Patient's presentation is most consistent with acute presentation with potential threat to life or bodily function.  Patient presents emergency department for lower abdominal pain and nausea.  Overall the patient appears well, no distress.  Patient does have mild to moderate tenderness to palpation across the lower abdomen.  States nausea but no vomiting.  No urinary symptoms.  No fever.  Vital signs show mild hypertension otherwise reassuring.  CBC is normal chemistry is normal including LFTs and lipase.  Will obtain a urinalysis.  Given the patient's tenderness to palpation across the lower abdomen we will obtain a CT scan with contrast to further evaluate differential would include diverticulitis, colitis, appendicitis, UTI pyelonephritis among others.  Patient's lab work shows a reassuring CBC, reassuring chemistry and lipase.  Urinalysis is pending.  CT scan is pending.  Patient care signed out to oncoming provider.  FINAL CLINICAL IMPRESSION(S) / ED DIAGNOSES   Lower abdominal pain   Note:  This document was  prepared using Conservation officer, historic buildings and may include unintentional dictation errors.   Ruth Cove, MD 06/20/23 1455

## 2023-06-29 ENCOUNTER — Telehealth (INDEPENDENT_AMBULATORY_CARE_PROVIDER_SITE_OTHER): Payer: Self-pay | Admitting: Vascular Surgery

## 2023-06-29 NOTE — Telephone Encounter (Signed)
 LVM for pt TCB and schedule laser ablation appt with Dr. Vonna Guardian  left leg GSV laser. see jd. no prior auth req decision #[id number] exp: 7.1.25 - 8.29.25   1 week post left GSV laser.   4 week post left leg GSV laser. see jd/fb

## 2023-07-02 ENCOUNTER — Telehealth (INDEPENDENT_AMBULATORY_CARE_PROVIDER_SITE_OTHER): Payer: Self-pay | Admitting: Vascular Surgery

## 2023-07-02 NOTE — Telephone Encounter (Signed)
 LVM for pt TCB and schedule laser ablation appt with Dr. Vonna Guardian  left leg GSV laser. see JD. no prior auth req. decision ID #[id number] exp: 7.1.25 - 8.29.25   1 week post left leg GSV laser   4 week post left leg GSV laser. see jd/fb

## 2023-08-06 ENCOUNTER — Ambulatory Visit: Admitting: Obstetrics

## 2023-08-07 ENCOUNTER — Ambulatory Visit: Admitting: Obstetrics and Gynecology

## 2023-08-07 ENCOUNTER — Encounter: Payer: Self-pay | Admitting: Obstetrics and Gynecology

## 2023-08-07 VITALS — BP 118/68 | HR 78 | Ht 60.0 in | Wt 175.0 lb

## 2023-08-07 DIAGNOSIS — N812 Incomplete uterovaginal prolapse: Secondary | ICD-10-CM | POA: Diagnosis not present

## 2023-08-07 DIAGNOSIS — R32 Unspecified urinary incontinence: Secondary | ICD-10-CM

## 2023-08-07 DIAGNOSIS — N3946 Mixed incontinence: Secondary | ICD-10-CM | POA: Diagnosis not present

## 2023-08-07 DIAGNOSIS — N993 Prolapse of vaginal vault after hysterectomy: Secondary | ICD-10-CM

## 2023-08-07 LAB — POCT URINALYSIS DIP (CLINITEK)
Bilirubin, UA: NEGATIVE
Blood, UA: NEGATIVE
Glucose, UA: NEGATIVE mg/dL
Ketones, POC UA: NEGATIVE mg/dL
Leukocytes, UA: NEGATIVE
Nitrite, UA: NEGATIVE
POC PROTEIN,UA: NEGATIVE
Spec Grav, UA: 1.02 (ref 1.010–1.025)
Urobilinogen, UA: 0.2 U/dL
pH, UA: 7 (ref 5.0–8.0)

## 2023-08-07 NOTE — Patient Instructions (Signed)

## 2023-08-07 NOTE — Progress Notes (Deleted)
 Jordan Palmer

## 2023-08-07 NOTE — Progress Notes (Signed)
 Return patient visit  SUBJECTIVE Chief Complaint: saw Glennon in Early 2025 and pt stated has bulge and feels as if it sitting at the opening of the vagina and has incontinence )  History of Present Illness: Jordan Palmer is a 73 y.o. with prolapse and incontinence  Urinary Symptoms: Leaks urine continuously Has to wear a pad all the time- during the day notices with standing and walking. Has small amounts throughout the day but does not always notice when its happening.  Pad use: 3 pads per day.   Patient is bothered by UI symptoms.  Day time voids 10.  Nocturia: 2 times per night to void. Voiding dysfunction:  empties bladder well.  Patient does not use a catheter to empty bladder.  When urinating, patient feels the need to urinate multiple times in a row Drinks: 1 cup coffe in AM, water with flavor, occasional vodka drink   UTIs: 1 UTI's in the last year.   Denies history of blood in urine and kidney or bladder stones   Pelvic Organ Prolapse Symptoms:                  Patient Admits to a feeling of a bulge the vaginal area. It has been present for 2 years.  Patient Admits to seeing a bulge.  This bulge is bothersome. It is present all the time.  s/p anterior repair, midurethral sling and cystoscopy on 05/03/20 with me S/p Total Vaginal Hysterectomy with left salpingectomy, uterosacral ligament suspension, anterior and posterior colporrhaphy and cystoscopy with Dr Jannis in 2019  Bowel Symptom: Bowel movements: 1 time(s) per day Stool consistency: hard or loose Straining: no.  Splinting: no.  Incomplete evacuation: no.  Patient Denies accidental bowel leakage / fecal incontinence Bowel regimen: none   Sexual Function Sexually active: no.  Used to be sexually active but difficult now ue to prolapse.   Pelvic Pain Denies pelvic pain   Past Medical History:  Past Medical History:  Diagnosis Date   Arthritis    oa knees   Cystocele with prolapse     Hyperlipemia    Hypertension    Hypothyroidism    Sleep apnea    cpap set on 25   SUI (stress urinary incontinence, female)    Wears contact lenses      Past Surgical History:   Past Surgical History:  Procedure Laterality Date   ABDOMINAL HYSTERECTOMY     ANTERIOR AND POSTERIOR REPAIR N/A 03/12/2017   Procedure: ANTERIOR (CYSTOCELE) AND POSTERIOR REPAIR (RECTOCELE);  Surgeon: Jertson, Jill Evelyn, MD;  Location: [facility];  Service: Gynecology;  Laterality: N/A;   BLADDER SUSPENSION N/A 05/03/2020   Procedure: TRANSVAGINAL TAPE (TVT) PROCEDURE;  Surgeon: Marilynne Rosaline SAILOR, MD;  Location: [facility];  Service: Gynecology;  Laterality: N/A;   CARPAL TUNNEL RELEASE Bilateral yrs ago   CHOLECYSTECTOMY  2001   laparoscopic   CYSTOCELE REPAIR N/A 05/03/2020   Procedure: ANTERIOR REPAIR (CYSTOCELE);  Surgeon: Marilynne Rosaline SAILOR, MD;  Location: [facility];  Service: Gynecology;  Laterality: N/A;  total time needed for 3 procedures is 1.5hrs   CYSTOSCOPY N/A 03/12/2017   Procedure: CYSTOSCOPY;  Surgeon: Jannis Kate Norris, MD;  Location: [facility];  Service: Gynecology;  Laterality: N/A;   CYSTOSCOPY N/A 05/03/2020   Procedure: CYSTOSCOPY;  Surgeon: Marilynne Rosaline SAILOR, MD;  Location: [facility];  Service: Gynecology;  Laterality: N/A;   KNEE ARTHROSCOPY Left 12/2019   KNEE  ARTHROSCOPY WITH MEDIAL MENISECTOMY Right 02/13/2019   Procedure: KNEE ARTHROSCOPY WITH MEDIAL MENISECTOMY;  Surgeon: Marchia Drivers, MD;  Location: [facility];  Service: Orthopedics;  Laterality: Right;   LACRIMAL DUCT PROBING W/ DACRYOPLASTY Bilateral    SALPINGOOPHORECTOMY Left 03/12/2017   Procedure: SALPINGECTOMY;  Surgeon: Jannis Kate Norris, MD;  Location: [facility];  Service: Gynecology;  Laterality: Left;   SHOULDER ARTHROSCOPY WITH OPEN ROTATOR CUFF REPAIR Right 09/03/2014   Procedure: SHOULDER ARTHROSCOPY WITH  OPEN ROTATOR CUFF REPAIR;  Surgeon: Drivers Marchia, MD;  Location: [facility];  Service: Orthopedics;  Laterality: Right;   THYROIDECTOMY Right yrs ago   hemi-thyroidectomy   TOTAL KNEE ARTHROPLASTY Right 05/20/2019   Procedure: TOTAL KNEE ARTHROPLASTY;  Surgeon: Marchia Drivers, MD;  Location: [facility];  Service: Orthopedics;  Laterality: Right;   TOTAL KNEE ARTHROPLASTY Left 12/23/2019   Procedure: Left TOTAL KNEE ARTHROPLASTY;  Surgeon: Marchia Drivers, MD;  Location: [facility];  Service: Orthopedics;  Laterality: Left;   VAGINAL HYSTERECTOMY N/A 03/12/2017   Procedure: HYSTERECTOMY VAGINAL;  Surgeon: Jannis Kate Norris, MD;  Location: [facility];  Service: Gynecology;  Laterality: N/A;     Medications: Patient has a current medication list which includes the following prescription(s): chlorpheniramine maleate, cyanocobalamin, donepezil, estradiol , estradiol , levothyroxine , lisinopril -hydrochlorothiazide , memantine, ondansetron , and rosuvastatin .   Allergies: Patient is allergic to codeine and doxycycline.   Social History:  Social History   Tobacco Use   Smoking status: Never   Smokeless tobacco: Never  Vaping Use   Vaping status: Never Used  Substance Use Topics   Alcohol use: Yes    Comment: occ    Drug use: No     Family History:   Family History  Problem Relation Age of Onset   Diabetes type II Father    CAD Father    Alzheimer's disease Father    Alzheimer's disease Mother    Breast cancer Neg Hx      Review of Systems: Review of Systems  Constitutional:  Negative for fever, malaise/fatigue and weight loss.  Respiratory:  Negative for cough, shortness of breath and wheezing.   Cardiovascular:  Negative for chest pain, palpitations and leg swelling.  Gastrointestinal:  Negative for abdominal pain and blood in stool.  Genitourinary:  Negative for dysuria.       + vaginal discharge  Musculoskeletal:  Negative for myalgias.  Skin:  Negative for  rash.  Neurological:  Negative for dizziness and headaches.  Endo/Heme/Allergies:  Does not bruise/bleed easily.  Psychiatric/Behavioral:  Negative for depression. The patient is not nervous/anxious.      OBJECTIVE Physical Exam: Vitals:   08/07/23 1010  BP: 118/68  Pulse: 78  Weight: 175 lb 6.4 oz (79.6 kg)  Height: 5' (1.524 m)    AAO x3 Abdomen: soft, nontender   GU / Detailed Urogynecologic Evaluation:  Pelvic Exam: Normal external female genitalia; Bartholin's and Skene's glands normal in appearance; urethral meatus normal in appearance, no urethral masses or discharge.   CST: negative  s/p hysterectomy: Speculum exam reveals normal vaginal mucosa with  atrophy and normal vaginal cuff.  Adnexa no mass, fullness, tenderness.     Pelvic floor strength II/V, puborectalis III/V external anal sphincter IV/V  Pelvic floor musculature: Right levator non-tender, Right obturator non-tender, Left levator non-tender, Left obturator non-tender  POP-Q:   POP-Q  -0.5  Aa   -0.5                                           Ba  -7                                              C   4                                            Gh  3                                            Pb  7.5                                            tvl   -1.5                                            Ap  -1.5                                            Bp                                                 D      Rectal Exam:  Normal sphincter tone, small distal rectocele, enterocoele not present, no rectal masses, no sign of dyssynergia when asking the patient to bear down.  Post-Void Residual (PVR) by Bladder Scan: In order to evaluate bladder emptying, we discussed obtaining a postvoid residual and patient agreed to this procedure.  Procedure: The ultrasound unit was placed on the patient's abdomen in the suprapubic region after the patient had  voided.     Post Void Residual - 08/07/23 1023       Post Void Residual   Post Void Residual 6 mL           Results for orders placed or performed in visit on 08/07/23  POCT URINALYSIS DIP (CLINITEK)   Collection Time: 08/07/23 10:28 AM  Result Value Ref Range   Color, UA yellow yellow   Clarity, UA clear clear   Glucose, UA negative negative mg/dL   Bilirubin, UA negative negative   Ketones, POC UA negative negative mg/dL   Spec Grav, UA 8.979 8.989 - 1.025   Blood, UA negative negative   pH, UA 7.0 5.0 - 8.0   POC PROTEIN,UA negative negative, trace   Urobilinogen, UA 0.2 0.2 or 1.0 E.U./dL   Nitrite, UA Negative Negative   Leukocytes, UA Negative Negative  ASSESSMENT AND PLAN Ms. Burress is a 73 y.o. with Stage II POP and mixed incontinence  Stage II anterior, Stage II posterior, Stage I apical prolapse - Pt has had two prior surgeries for prolapse repair so recommended Robotic sacrocolpopexy with mesh.  - Also reviewed conservative options such as pelvic PT, pessary and repeat native tissue repair. She prefers surgery with mesh.   2. Incontinence - She does have a history of sling in 2022 and overactive bladder - Unclear what type of incontinence has worsened so will have her undergo urodynamic testing.   Return for urodynamics  Rosaline LOISE Caper, MD

## 2023-08-14 ENCOUNTER — Ambulatory Visit (INDEPENDENT_AMBULATORY_CARE_PROVIDER_SITE_OTHER): Admitting: Obstetrics and Gynecology

## 2023-08-14 VITALS — BP 111/73 | HR 61

## 2023-08-14 DIAGNOSIS — R32 Unspecified urinary incontinence: Secondary | ICD-10-CM

## 2023-08-14 DIAGNOSIS — N3281 Overactive bladder: Secondary | ICD-10-CM | POA: Diagnosis not present

## 2023-08-14 DIAGNOSIS — N393 Stress incontinence (female) (male): Secondary | ICD-10-CM

## 2023-08-14 DIAGNOSIS — R948 Abnormal results of function studies of other organs and systems: Secondary | ICD-10-CM

## 2023-08-14 DIAGNOSIS — N3946 Mixed incontinence: Secondary | ICD-10-CM

## 2023-08-14 DIAGNOSIS — R350 Frequency of micturition: Secondary | ICD-10-CM

## 2023-08-14 LAB — POCT URINALYSIS DIP (CLINITEK)
Bilirubin, UA: NEGATIVE
Blood, UA: NEGATIVE
Glucose, UA: NEGATIVE mg/dL
Ketones, POC UA: NEGATIVE mg/dL
Leukocytes, UA: NEGATIVE
Nitrite, UA: NEGATIVE
POC PROTEIN,UA: NEGATIVE
Spec Grav, UA: 1.02 (ref 1.010–1.025)
Urobilinogen, UA: 0.2 U/dL
pH, UA: 7 (ref 5.0–8.0)

## 2023-08-14 NOTE — Patient Instructions (Addendum)
 Taking Care of Yourself after Urodynamics   Drink plenty of water for a day or two following your procedure. Try to have about 8 ounces (one cup) at a time, and do this 6 times or more per day unless you have fluid restrictitons AVOID irritative beverages such as coffee, tea, soda, alcoholic or citrus drinks for a day or two, as this may cause burning with urination. You may experience some discomfort or a burning sensation with urination after having this procedure. You can use over the counter Azo or pyridium  to help with burning and follow the instructions on the packaging. If it does not improve within 1-2 days, or other symptoms appear (fever, chills, or difficulty urinating) call the office to speak to a nurse.  You may return to normal daily activities such as work, school, driving, exercising and housework on the day of the procedure. If your doctor gave you a prescription, take it as ordered.    Please bring your husband to your next appointment with Dr. Marilynne.

## 2023-08-14 NOTE — Progress Notes (Signed)
 [facility] Urodynamics Procedure  Referring Physician: Rudolpho Norleen BIRCH, MD Date of Procedure: 08/14/2023  Jordan Palmer is a 73 y.o. female who presents for urodynamic evaluation. Indication(s) for study: mixed incontinence  Vital Signs: BP 111/73   Pulse 61   Laboratory Results: A catheterized urine specimen revealed:  POC urine:  Lab Results  Component Value Date   COLORU yellow 08/14/2023   CLARITYU clear 08/14/2023   GLUCOSEUR negative 08/14/2023   BILIRUBINUR negative 08/14/2023   KETONESU Negative 08/23/2020   SPECGRAV 1.020 08/14/2023   RBCUR negative 08/14/2023   PHUR 7.0 08/14/2023   PROTEINUR NEGATIVE 06/20/2023   UROBILINOGEN 0.2 08/14/2023   LEUKOCYTESUR Negative 08/14/2023     Voiding Diary: Deferred  Procedure Timeout:  The correct patient was verified and the correct procedure was verified. The patient was in the correct position and safety precautions were reviewed based on at the patient's history.  Urodynamic Procedure A 69F dual lumen urodynamics catheter was placed under sterile conditions into the patient's bladder. A 69F catheter was placed into the rectum in order to measure abdominal pressure. EMG patches were placed in the appropriate position.  All connections were confirmed and calibrations/adjusted made. Saline was instilled into the bladder through the dual lumen catheters.  Cough/valsalva pressures were measured periodically during filling.  Patient was allowed to void.  The bladder was then emptied of its residual.  UROFLOW: Revealed a Qmax of 7 mL/sec.  She voided 102 mL and had a residual of 40 mL.  It was a prolonged normal pattern and represented normal habits though interpretation limited due to low voided volume.  CMG: This was performed with sterile water in the sitting position at a fill rate of 30 mL/min.    First sensation of fullness was 72 mLs,  First urge was 158 mLs,  Strong urge was 300 mLs and  Capacity  was 459 mLs  Stress incontinence was demonstrated Highest positive Barrier CLPP was 112 cmH20 at 301 ml. Highest negative Barrier VLPP was 80 cmH20 at 301  ml.  Detrusor function was overactive, with phasic contractions seen.  The first occurred at 10 mL to 5 cm of water and was not associated with urge.  Compliance:  Low. End fill detrusor pressure was 48.2cmH20.  Calculated compliance was 51mL/cmH20  UPP: MUCP with barrier reduction was 65 cm of water.    MICTURITION STUDY: Voiding was performed with reduction using scopettes in the sitting position.  Pdet at Qmax was 81 cm of water.  Qmax was 12 mL/sec.  It was a interrupted pattern.  She voided 259 mL and had a residual of 200 mL.  It was a volitional void, sustained detrusor contraction was present and abdominal straining was present  EMG: This was performed with patches.  She had voluntary contractions, recruitment with fill was present and urethral sphincter was relaxed with void.  The details of the procedure with the study tracings have been scanned into EPIC.   Urodynamic Impression:  1. Sensation was normal; capacity was normal 2. Stress Incontinence was demonstrated at normal pressures; 3. Detrusor Overactivity was demonstrated without leakage. 4. Emptying was dysfunctional with a elevated PVR ( ) , a sustained detrusor contraction present,  abdominal straining present, normal urethral sphincter activity on EMG.  Plan: - The patient will follow up  to discuss the findings and treatment options. '

## 2023-08-20 ENCOUNTER — Encounter: Payer: Self-pay | Admitting: *Deleted

## 2023-08-23 ENCOUNTER — Ambulatory Visit
Admission: RE | Admit: 2023-08-23 | Discharge: 2023-08-23 | Disposition: A | Discharge status: Home / Self Care | Source: Ambulatory Visit | Attending: Obstetrics and Gynecology | Admitting: Obstetrics and Gynecology

## 2023-08-23 DIAGNOSIS — Z1231 Encounter for screening mammogram for malignant neoplasm of breast: Secondary | ICD-10-CM | POA: Insufficient documentation

## 2023-08-23 DIAGNOSIS — Z01419 Encounter for gynecological examination (general) (routine) without abnormal findings: Secondary | ICD-10-CM | POA: Diagnosis present

## 2023-08-28 ENCOUNTER — Ambulatory Visit: Payer: Self-pay | Admitting: Obstetrics and Gynecology

## 2023-08-29 ENCOUNTER — Ambulatory Visit: Admitting: Obstetrics and Gynecology

## 2023-09-07 ENCOUNTER — Ambulatory Visit (INDEPENDENT_AMBULATORY_CARE_PROVIDER_SITE_OTHER): Admitting: Vascular Surgery

## 2023-09-07 ENCOUNTER — Encounter (INDEPENDENT_AMBULATORY_CARE_PROVIDER_SITE_OTHER): Payer: Self-pay | Admitting: Vascular Surgery

## 2023-09-07 VITALS — BP 144/86 | HR 71 | Resp 16 | Ht 60.0 in | Wt 172.6 lb

## 2023-09-07 DIAGNOSIS — I83812 Varicose veins of left lower extremities with pain: Secondary | ICD-10-CM | POA: Diagnosis not present

## 2023-09-07 NOTE — Progress Notes (Signed)
 Jordan Palmer is a 73 y.o. female who presents with symptomatic venous reflux  Past Medical History:  Diagnosis Date   Arthritis    oa knees   Cystocele with prolapse    Hyperlipemia    Hypertension    Hypothyroidism    Memory deficit    Sleep apnea    cpap set on 25   SUI (stress urinary incontinence, female)    Wears contact lenses     Past Surgical History:  Procedure Laterality Date   ABDOMINAL HYSTERECTOMY     ANTERIOR AND POSTERIOR REPAIR N/A 03/12/2017   Procedure: ANTERIOR (CYSTOCELE) AND POSTERIOR REPAIR (RECTOCELE);  Surgeon: Jertson, Jill Evelyn, MD;  Location: [facility];  Service: Gynecology;  Laterality: N/A;   BLADDER SUSPENSION N/A 05/03/2020   Procedure: TRANSVAGINAL TAPE (TVT) PROCEDURE;  Surgeon: Marilynne Rosaline SAILOR, MD;  Location: [facility];  Service: Gynecology;  Laterality: N/A;   CARPAL TUNNEL RELEASE Bilateral yrs ago   CHOLECYSTECTOMY  2001   laparoscopic   CYSTOCELE REPAIR N/A 05/03/2020   Procedure: ANTERIOR REPAIR (CYSTOCELE);  Surgeon: Marilynne Rosaline SAILOR, MD;  Location: [facility];  Service: Gynecology;  Laterality: N/A;  total time needed for 3 procedures is 1.5hrs   CYSTOSCOPY N/A 03/12/2017   Procedure: CYSTOSCOPY;  Surgeon: Jannis Kate Norris, MD;  Location: [facility];  Service: Gynecology;  Laterality: N/A;   CYSTOSCOPY N/A 05/03/2020   Procedure: CYSTOSCOPY;  Surgeon: Marilynne Rosaline SAILOR, MD;  Location: [facility];  Service: Gynecology;  Laterality: N/A;   KNEE ARTHROSCOPY Left 12/2019   KNEE ARTHROSCOPY WITH MEDIAL MENISECTOMY Right 02/13/2019   Procedure: KNEE ARTHROSCOPY WITH MEDIAL MENISECTOMY;  Surgeon: Marchia Drivers, MD;  Location: [facility];  Service: Orthopedics;  Laterality: Right;   LACRIMAL DUCT PROBING W/ DACRYOPLASTY Bilateral    SALPINGOOPHORECTOMY Left 03/12/2017   Procedure: SALPINGECTOMY;  Surgeon: Jannis Kate Norris, MD;  Location: [facility];  Service: Gynecology;  Laterality: Left;   SHOULDER ARTHROSCOPY WITH OPEN ROTATOR CUFF REPAIR Right 09/03/2014   Procedure: SHOULDER ARTHROSCOPY WITH OPEN ROTATOR CUFF REPAIR;  Surgeon: Drivers Marchia, MD;  Location: [facility];  Service: Orthopedics;  Laterality: Right;   THYROIDECTOMY Right yrs ago   hemi-thyroidectomy   TOTAL KNEE ARTHROPLASTY Right 05/20/2019   Procedure: TOTAL KNEE ARTHROPLASTY;  Surgeon: Marchia Drivers, MD;  Location: [facility];  Service: Orthopedics;  Laterality: Right;   TOTAL KNEE ARTHROPLASTY Left 12/23/2019   Procedure: Left TOTAL KNEE ARTHROPLASTY;  Surgeon: Marchia Drivers, MD;  Location: [facility];  Service: Orthopedics;  Laterality: Left;   VAGINAL HYSTERECTOMY N/A 03/12/2017   Procedure: HYSTERECTOMY VAGINAL;  Surgeon: Jannis Kate Norris, MD;  Location: [facility];  Service: Gynecology;  Laterality: N/A;     Current Outpatient Medications:    Chlorpheniramine Maleate (ALLERGY RELIEF PO), Take by mouth., Disp: , Rfl:    Cyanocobalamin (B-12 PO), Take by mouth., Disp: , Rfl:    donepezil (ARICEPT) 10 MG tablet, Take 10 mg by mouth at bedtime., Disp: , Rfl:    levothyroxine  (SYNTHROID ) 88 MCG tablet, Take 88 mcg by mouth daily before breakfast., Disp: , Rfl:    lisinopril -hydrochlorothiazide  (PRINZIDE ,ZESTORETIC ) 20-25 MG per tablet, Take 1 tablet by mouth daily., Disp: , Rfl:    memantine (NAMENDA) 5 MG tablet, Take 5 mg by mouth daily., Disp: , Rfl:    rosuvastatin  (CRESTOR ) 5 MG tablet, Take 5 mg by mouth daily at 2 PM. Takes at 200 pm,  Disp: , Rfl:    estradiol  (CLIMARA ) 0.025 mg/24hr patch, Place 1 patch (0.025 mg total) onto the skin once a week., Disp: 4 patch, Rfl: 12   estradiol  (ESTRACE  VAGINAL) 0.1 MG/GM vaginal cream, Place 1 Applicatorful vaginally at bedtime. Apply a dime size amount to biopsy site and affected areas night for 3 weeks then use 3 times a week there after, Disp: 42.5 g, Rfl: 0   ondansetron  (ZOFRAN )  4 MG tablet, Take 1 tablet (4 mg total) by mouth every 8 (eight) hours as needed., Disp: 15 tablet, Rfl: 0  Allergies  Allergen Reactions   Codeine Nausea And Vomiting   Doxycycline Photosensitivity     Varicose veins of leg with pain, left     PLAN: The patient's left lower extremity was sterilely prepped and draped. The ultrasound machine was used to visualize the saphenous vein throughout its course. A segment in the mid to upper calf was selected for access. The saphenous vein was accessed without difficulty using ultrasound guidance with a micropuncture needle. A 0.018 wire was then placed beyond the saphenofemoral junction and the needle was removed. The 65 cm sheath was then placed over the wire and the wire and dilator were removed. The laser fiber was then placed through the sheath and its tip was placed approximately 4-5 centimeters below the saphenofemoral junction. Tumescent anesthesia was then created with a dilute lidocaine  solution. Laser energy was then delivered with constant withdrawal of the sheath and laser fiber. Approximately 1516 joules of energy were delivered over a length of 35 centimeters using a 1470 Hz VenaCure machine at 7 W. Sterile dressings were placed. The patient tolerated the procedure well without obvious complications.   Follow-up in 1 week with post-laser duplex.

## 2023-09-10 ENCOUNTER — Ambulatory Visit: Admitting: Dermatology

## 2023-09-10 ENCOUNTER — Encounter: Payer: Self-pay | Admitting: Dermatology

## 2023-09-10 DIAGNOSIS — L814 Other melanin hyperpigmentation: Secondary | ICD-10-CM

## 2023-09-10 DIAGNOSIS — D229 Melanocytic nevi, unspecified: Secondary | ICD-10-CM

## 2023-09-10 DIAGNOSIS — L578 Other skin changes due to chronic exposure to nonionizing radiation: Secondary | ICD-10-CM

## 2023-09-10 DIAGNOSIS — L570 Actinic keratosis: Secondary | ICD-10-CM

## 2023-09-10 DIAGNOSIS — L821 Other seborrheic keratosis: Secondary | ICD-10-CM | POA: Diagnosis not present

## 2023-09-10 DIAGNOSIS — Z1283 Encounter for screening for malignant neoplasm of skin: Secondary | ICD-10-CM | POA: Diagnosis not present

## 2023-09-10 DIAGNOSIS — D1801 Hemangioma of skin and subcutaneous tissue: Secondary | ICD-10-CM | POA: Diagnosis not present

## 2023-09-10 NOTE — Progress Notes (Signed)
   New Patient Visit   Subjective  Jordan Palmer is a 73 y.o. female who presents for the following: full body skin check, no previous spots of concern.   The following portions of the chart were reviewed this encounter and updated as appropriate: medications, allergies, medical history  Review of Systems:  No other skin or systemic complaints except as noted in HPI or Assessment and Plan.  Objective  Well appearing patient in no apparent distress; mood and affect are within normal limits.  A full examination was performed including scalp, head, eyes, ears, nose, lips, neck, chest, axillae, abdomen, back, buttocks, bilateral upper extremities, bilateral lower extremities, hands, feet, fingers, toes, fingernails, and toenails. All findings within normal limits unless otherwise noted below.   Relevant exam findings are noted in the Assessment and Plan.  Left Forearm - Posterior (2), Right Forearm - Posterior (2), Right Hand - Posterior, Right Malar Cheek Erythematous thin papules/macules with gritty scale.   Assessment & Plan   SKIN CANCER SCREENING PERFORMED TODAY.  ACTINIC DAMAGE - Chronic condition, secondary to cumulative UV/sun exposure - diffuse scaly erythematous macules with underlying dyspigmentation - Recommend daily broad spectrum sunscreen SPF 30+ to sun-exposed areas, reapply every 2 hours as needed.  - Staying in the shade or wearing long sleeves, sun glasses (UVA+UVB protection) and wide brim hats (4-inch brim around the entire circumference of the hat) are also recommended for sun protection.  - Call for new or changing lesions.  LENTIGINES, SEBORRHEIC KERATOSES, HEMANGIOMAS - Benign normal skin lesions - Benign-appearing - Call for any changes  MELANOCYTIC NEVI - Tan-brown and/or pink-flesh-colored symmetric macules and papules - Benign appearing on exam today - Observation - Call clinic for new or changing moles - Recommend daily use of broad spectrum spf 30+  sunscreen to sun-exposed areas.   AK (ACTINIC KERATOSIS) (6) Left Forearm - Posterior (2), Right Forearm - Posterior (2), Right Hand - Posterior, Right Malar Cheek Destruction of lesion - Left Forearm - Posterior (2), Right Forearm - Posterior (2), Right Hand - Posterior, Right Malar Cheek Complexity: simple   Destruction method: cryotherapy   Informed consent: discussed and consent obtained   Outcome: patient tolerated procedure well with no complications   Post-procedure details: wound care instructions given    LENTIGINES   SEBORRHEIC KERATOSIS   CHERRY ANGIOMA   MULTIPLE BENIGN NEVI   ACTINIC SKIN DAMAGE    Return in about 1 year (around 09/09/2024) for Full Body Skin Exam.   Documentation: I have reviewed the above documentation for accuracy and completeness, and I agree with the above.  RUFUS CHRISTELLA HOLY, MD

## 2023-09-10 NOTE — Patient Instructions (Addendum)

## 2023-09-13 ENCOUNTER — Other Ambulatory Visit (INDEPENDENT_AMBULATORY_CARE_PROVIDER_SITE_OTHER): Payer: Self-pay | Admitting: Vascular Surgery

## 2023-09-13 DIAGNOSIS — I83812 Varicose veins of left lower extremities with pain: Secondary | ICD-10-CM

## 2023-09-14 ENCOUNTER — Ambulatory Visit (INDEPENDENT_AMBULATORY_CARE_PROVIDER_SITE_OTHER)

## 2023-09-14 DIAGNOSIS — I83812 Varicose veins of left lower extremities with pain: Secondary | ICD-10-CM

## 2023-10-05 ENCOUNTER — Encounter (INDEPENDENT_AMBULATORY_CARE_PROVIDER_SITE_OTHER): Payer: Self-pay | Admitting: Nurse Practitioner

## 2023-10-05 ENCOUNTER — Ambulatory Visit (INDEPENDENT_AMBULATORY_CARE_PROVIDER_SITE_OTHER): Admitting: Nurse Practitioner

## 2023-10-05 VITALS — BP 128/76 | HR 65 | Ht 60.0 in | Wt 178.1 lb

## 2023-10-05 DIAGNOSIS — I83812 Varicose veins of left lower extremities with pain: Secondary | ICD-10-CM | POA: Diagnosis not present

## 2023-10-05 DIAGNOSIS — E782 Mixed hyperlipidemia: Secondary | ICD-10-CM

## 2023-10-05 DIAGNOSIS — I10 Essential (primary) hypertension: Secondary | ICD-10-CM | POA: Diagnosis not present

## 2023-10-07 NOTE — Progress Notes (Signed)
 Subjective:    Patient ID: Jordan Palmer, female    DOB: December 28, 1950, 73 y.o.   MRN: [id number] Chief Complaint  Patient presents with   Follow-up    The patient returns to the office for followup status post laser ablation of the left great saphenous vein on 09/07/2023.  She denies any issues postintervention.  She also denies having any recurrent bleeding postintervention of her varicose veins.  The patient's swelling is minimally from preoperative status. The patient continues to wear graduated compression stockings on a daily basis but these are not eliminating the pain and discomfort. The patient continues to use over-the-counter anti-inflammatory medications to treat the pain and related symptoms but this has not given the patient relief. The patient notes the pain in the lower extremities is causing problems with daily exercise, problems at work and even with household activities such as preparing meals and doing dishes.  The patient is otherwise done well and there have been no complications related to the laser procedure or interval changes in the patient's overall   Post laser ultrasound shows unsuccessful ablation of the left GSV      Review of Systems  Hematological:  Does not bruise/bleed easily.  All other systems reviewed and are negative.      Objective:   Physical Exam Vitals reviewed.  HENT:     Head: Normocephalic.  Cardiovascular:     Rate and Rhythm: Normal rate.  Pulmonary:     Effort: Pulmonary effort is normal.  Skin:    General: Skin is warm and dry.  Neurological:     Mental Status: She is alert and oriented to person, place, and time.  Psychiatric:        Mood and Affect: Mood normal.        Behavior: Behavior normal.        Thought Content: Thought content normal.        Judgment: Judgment normal.     BP 128/76   Pulse 65   Ht 5' (1.524 m)   Wt 178 lb 2 oz (80.8 kg)   BMI 34.79 kg/m   Past Medical History:  Diagnosis Date   Arthritis     oa knees   Cystocele with prolapse    Hyperlipemia    Hypertension    Hypothyroidism    Memory deficit    Sleep apnea    cpap set on 25   SUI (stress urinary incontinence, female)    Wears contact lenses     Social History   Socioeconomic History   Marital status: Married    Spouse name: Not on file   Number of children: Not on file   Years of education: Not on file   Highest education level: Not on file  Occupational History   Not on file  Tobacco Use   Smoking status: Never   Smokeless tobacco: Never  Vaping Use   Vaping status: Never Used  Substance and Sexual Activity   Alcohol use: Yes    Comment: occ    Drug use: No   Sexual activity: Yes    Partners: Male    Birth control/protection: Surgical    Comment: hysterectomy  Other Topics Concern   Not on file  Social History Narrative   Not on file   Social Drivers of Health   Financial Resource Strain: Low Risk  (05/05/2023)   Received from [facility]   Overall Financial Resource Strain (CARDIA)    Difficulty of Paying  Living Expenses: Not hard at all  Food Insecurity: No Food Insecurity (05/05/2023)   Received from [facility]   Hunger Vital Sign    Within the past 12 months, you worried that your food would run out before you got the money to buy more.: Never true    Within the past 12 months, the food you bought just didn't last and you didn't have money to get more.: Never true  Transportation Needs: No Transportation Needs (05/05/2023)   Received from [facility] - Transportation    In the past 12 months, has lack of transportation kept you from medical appointments or from getting medications?: No    Lack of Transportation (Non-Medical): No  Physical Activity: Not on file  Stress: Not on file  Social Connections: Not on file  Intimate Partner Violence: Not on file    Past Surgical History:  Procedure Laterality Date   ABDOMINAL  HYSTERECTOMY     ANTERIOR AND POSTERIOR REPAIR N/A 03/12/2017   Procedure: ANTERIOR (CYSTOCELE) AND POSTERIOR REPAIR (RECTOCELE);  Surgeon: Jertson, Jill Evelyn, MD;  Location: [facility];  Service: Gynecology;  Laterality: N/A;   BLADDER SUSPENSION N/A 05/03/2020   Procedure: TRANSVAGINAL TAPE (TVT) PROCEDURE;  Surgeon: Marilynne Rosaline SAILOR, MD;  Location: [facility];  Service: Gynecology;  Laterality: N/A;   CARPAL TUNNEL RELEASE Bilateral yrs ago   CHOLECYSTECTOMY  2001   laparoscopic   CYSTOCELE REPAIR N/A 05/03/2020   Procedure: ANTERIOR REPAIR (CYSTOCELE);  Surgeon: Marilynne Rosaline SAILOR, MD;  Location: [facility];  Service: Gynecology;  Laterality: N/A;  total time needed for 3 procedures is 1.5hrs   CYSTOSCOPY N/A 03/12/2017   Procedure: CYSTOSCOPY;  Surgeon: Jannis Kate Norris, MD;  Location: [facility];  Service: Gynecology;  Laterality: N/A;   CYSTOSCOPY N/A 05/03/2020   Procedure: CYSTOSCOPY;  Surgeon: Marilynne Rosaline SAILOR, MD;  Location: [facility];  Service: Gynecology;  Laterality: N/A;   KNEE ARTHROSCOPY Left 12/2019   KNEE ARTHROSCOPY WITH MEDIAL MENISECTOMY Right 02/13/2019   Procedure: KNEE ARTHROSCOPY WITH MEDIAL MENISECTOMY;  Surgeon: Marchia Drivers, MD;  Location: [facility];  Service: Orthopedics;  Laterality: Right;   LACRIMAL DUCT PROBING W/ DACRYOPLASTY Bilateral    SALPINGOOPHORECTOMY Left 03/12/2017   Procedure: SALPINGECTOMY;  Surgeon: Jannis Kate Norris, MD;  Location: [facility];  Service: Gynecology;  Laterality: Left;   SHOULDER ARTHROSCOPY WITH OPEN ROTATOR CUFF REPAIR Right 09/03/2014   Procedure: SHOULDER ARTHROSCOPY WITH OPEN ROTATOR CUFF REPAIR;  Surgeon: Drivers Marchia, MD;  Location: [facility];  Service: Orthopedics;  Laterality: Right;   THYROIDECTOMY Right yrs ago   hemi-thyroidectomy   TOTAL KNEE ARTHROPLASTY Right 05/20/2019   Procedure: TOTAL KNEE  ARTHROPLASTY;  Surgeon: Marchia Drivers, MD;  Location: [facility];  Service: Orthopedics;  Laterality: Right;   TOTAL KNEE ARTHROPLASTY Left 12/23/2019   Procedure: Left TOTAL KNEE ARTHROPLASTY;  Surgeon: Marchia Drivers, MD;  Location: [facility];  Service: Orthopedics;  Laterality: Left;   VAGINAL HYSTERECTOMY N/A 03/12/2017   Procedure: HYSTERECTOMY VAGINAL;  Surgeon: Jannis Kate Norris, MD;  Location: [facility];  Service: Gynecology;  Laterality: N/A;    Family History  Problem Relation Age of Onset   Diabetes type II Father    CAD Father    Alzheimer's disease Father    Alzheimer's disease Mother    Breast cancer Neg Hx     Allergies  Allergen Reactions  Codeine Nausea And Vomiting   Doxycycline Photosensitivity       Latest Ref Rng & Units 06/20/2023   12:30 PM 03/28/2023    8:40 AM 12/24/2019    5:27 AM  CBC  WBC 4.0 - 10.5 K/uL 8.9  6.1  5.2   Hemoglobin 12.0 - 15.0 g/dL 87.0  88.7  9.8   Hematocrit 36.0 - 46.0 % 38.1  32.8  29.1   Platelets 150 - 400 K/uL 247  250  183       CMP     Component Value Date/Time   NA 137 06/20/2023 1230   K 3.8 06/20/2023 1230   CL 102 06/20/2023 1230   CO2 23 06/20/2023 1230   GLUCOSE 128 (H) 06/20/2023 1230   BUN 25 (H) 06/20/2023 1230   CREATININE 0.90 06/20/2023 1230   CALCIUM  9.7 06/20/2023 1230   PROT 8.1 06/20/2023 1230   ALBUMIN 4.5 06/20/2023 1230   AST 29 06/20/2023 1230   ALT 23 06/20/2023 1230   ALKPHOS 49 06/20/2023 1230   BILITOT 0.8 06/20/2023 1230   GFRNONAA >60 06/20/2023 1230     No results found.     Assessment & Plan:   1. Varicose veins of leg with pain, left (Primary) Unfortunately the ablation was unsuccessful.  The patient notes that she has not had any worsening bleeding of her varicosities.  She has advised to continue with the use of medical grade compression.  Will have her return in 3 months to repeat studies to determine if patient should undergo redo endovenous laser  ablation.  2. Essential hypertension Continue antihypertensive medications as already ordered, these medications have been reviewed and there are no changes at this time.  3. Mixed dyslipidemia Continue statin as ordered and reviewed, no changes at this time   Current Outpatient Medications on File Prior to Visit  Medication Sig Dispense Refill   Chlorpheniramine Maleate (ALLERGY RELIEF PO) Take by mouth.     Cyanocobalamin (B-12 PO) Take by mouth.     donepezil (ARICEPT) 10 MG tablet Take 10 mg by mouth at bedtime.     estradiol  (ESTRACE  VAGINAL) 0.1 MG/GM vaginal cream Place 1 Applicatorful vaginally at bedtime. Apply a dime size amount to biopsy site and affected areas night for 3 weeks then use 3 times a week there after 42.5 g 0   levothyroxine  (SYNTHROID ) 88 MCG tablet Take 88 mcg by mouth daily before breakfast.     lisinopril -hydrochlorothiazide  (PRINZIDE ,ZESTORETIC ) 20-25 MG per tablet Take 1 tablet by mouth daily.     memantine (NAMENDA) 5 MG tablet Take 5 mg by mouth daily.     ondansetron  (ZOFRAN ) 4 MG tablet Take 1 tablet (4 mg total) by mouth every 8 (eight) hours as needed. 15 tablet 0   rosuvastatin  (CRESTOR ) 5 MG tablet Take 5 mg by mouth daily at 2 PM. Takes at 200 pm     No current facility-administered medications on file prior to visit.    There are no Patient Instructions on file for this visit. No follow-ups on file.   Dontavious Emily E Laren Orama, NP

## 2023-10-10 ENCOUNTER — Ambulatory Visit: Admitting: Obstetrics and Gynecology

## 2023-10-10 ENCOUNTER — Encounter: Payer: Self-pay | Admitting: Obstetrics and Gynecology

## 2023-10-10 VITALS — BP 130/62 | HR 65

## 2023-10-10 DIAGNOSIS — R339 Retention of urine, unspecified: Secondary | ICD-10-CM

## 2023-10-10 DIAGNOSIS — N812 Incomplete uterovaginal prolapse: Secondary | ICD-10-CM

## 2023-10-10 DIAGNOSIS — N993 Prolapse of vaginal vault after hysterectomy: Secondary | ICD-10-CM

## 2023-10-10 DIAGNOSIS — N3946 Mixed incontinence: Secondary | ICD-10-CM

## 2023-10-10 NOTE — Progress Notes (Signed)
 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 She is planning to proceed with surgery and underwent urodynamic testing.   Urodynamic Impression:  1. Sensation was normal; capacity was normal 2. Stress Incontinence was demonstrated at normal pressures; 3. Detrusor Overactivity was demonstrated without leakage. 4. Emptying was dysfunctional with a elevated PVR ( ) , a sustained detrusor contraction present,  abdominal straining present, normal urethral sphincter activity on EMG.  Past Medical History:  Past Medical History:  Diagnosis Date   Arthritis    oa knees   Cystocele with prolapse    Hyperlipemia    Hypertension    Hypothyroidism    Sleep apnea    cpap set on 25   SUI (stress urinary incontinence, female)    Wears contact lenses      Past Surgical History:   Past Surgical History:  Procedure Laterality Date   ABDOMINAL HYSTERECTOMY     ANTERIOR AND POSTERIOR REPAIR N/A 03/12/2017   Procedure: ANTERIOR (CYSTOCELE) AND POSTERIOR REPAIR (RECTOCELE);  Surgeon: Jertson, Jill Evelyn, MD;  Location: [facility];  Service: Gynecology;  Laterality: N/A;   BLADDER SUSPENSION N/A 05/03/2020   Procedure: TRANSVAGINAL TAPE (TVT) PROCEDURE;  Surgeon: Marilynne Rosaline SAILOR, MD;  Location: [facility];  Service: Gynecology;  Laterality: N/A;   CARPAL TUNNEL RELEASE Bilateral yrs ago   CHOLECYSTECTOMY  2001   laparoscopic   CYSTOCELE REPAIR N/A 05/03/2020   Procedure: ANTERIOR REPAIR (CYSTOCELE);  Surgeon: Marilynne Rosaline SAILOR, MD;  Location: [facility];  Service: Gynecology;  Laterality: N/A;  total time needed for 3 procedures is 1.5hrs   CYSTOSCOPY N/A 03/12/2017   Procedure: CYSTOSCOPY;  Surgeon: Jannis Kate Norris, MD;  Location: [facility];  Service: Gynecology;  Laterality: N/A;   CYSTOSCOPY N/A 05/03/2020   Procedure: CYSTOSCOPY;  Surgeon: Marilynne Rosaline SAILOR, MD;   Location: [facility];  Service: Gynecology;  Laterality: N/A;   KNEE ARTHROSCOPY Left 12/2019   KNEE ARTHROSCOPY WITH MEDIAL MENISECTOMY Right 02/13/2019   Procedure: KNEE ARTHROSCOPY WITH MEDIAL MENISECTOMY;  Surgeon: Marchia Drivers, MD;  Location: [facility];  Service: Orthopedics;  Laterality: Right;   LACRIMAL DUCT PROBING W/ DACRYOPLASTY Bilateral    SALPINGOOPHORECTOMY Left 03/12/2017   Procedure: SALPINGECTOMY;  Surgeon: Jannis Kate Norris, MD;  Location: [facility];  Service: Gynecology;  Laterality: Left;   SHOULDER ARTHROSCOPY WITH OPEN ROTATOR CUFF REPAIR Right 09/03/2014   Procedure: SHOULDER ARTHROSCOPY WITH OPEN ROTATOR CUFF REPAIR;  Surgeon: Drivers Marchia, MD;  Location: [facility];  Service: Orthopedics;  Laterality: Right;   THYROIDECTOMY Right yrs ago   hemi-thyroidectomy   TOTAL KNEE ARTHROPLASTY Right 05/20/2019   Procedure: TOTAL KNEE ARTHROPLASTY;  Surgeon: Marchia Drivers, MD;  Location: [facility];  Service: Orthopedics;  Laterality: Right;   TOTAL KNEE ARTHROPLASTY Left 12/23/2019   Procedure: Left TOTAL KNEE ARTHROPLASTY;  Surgeon: Marchia Drivers, MD;  Location: [facility];  Service: Orthopedics;  Laterality: Left;   VAGINAL HYSTERECTOMY N/A 03/12/2017   Procedure: HYSTERECTOMY VAGINAL;  Surgeon: Jannis Kate Norris, MD;  Location: [facility];  Service: Gynecology;  Laterality: N/A;     Medications: Patient has a current medication list which includes the following prescription(s): chlorpheniramine maleate, cyanocobalamin, donepezil, estradiol , estradiol , levothyroxine , lisinopril -hydrochlorothiazide , memantine, ondansetron , and rosuvastatin .    OBJECTIVE Physical Exam: Vitals:   Vitals:   10/10/23 0815  BP: 130/62  Pulse: 65     AAO x3 Prior exam:  POP-Q:   POP-Q   -0.5                                            Aa   -0.5                                           Ba   -7                                               C    4                                            Gh   3                                            Pb   7.5                                            tvl    -1.5                                            Ap   -1.5                                            Bp                                                  D     ASSESSMENT AND PLAN Ms. Corso is a 72 y.o. with Stage II POP and mixed incontinence  - We discussed that incomplete emptying (PVR ) may be from the recurrent prolapse vs the sling. She reports she does not feel like she strains to urinate at all.  - Looking back after her surgery in 2022, she did need to self-catheterize after not passing her initial two voiding trials. Pt self reported that she had minimal from the PVR self caths so did not continue. So it is possible that the sling may be causing partial obstruction, vs recurrent prolapse, or a combination of this.  - For this reason, would not recommend a concurrent anti-incontinence procedure. Will need to reevaluate for incomplete emptying following prolapse repair.   Plan for surgery: Exam under anesthesia, robotic assisted sacrocolpopexy, perineorrhaphy, cystoscopy  - We reviewed the patient's specific anatomic and functional findings, with the assistance of diagrams, and together finalized the above procedure.  The planned surgical procedures were discussed along with the surgical risks outlined below, which were also provided on a detailed handout. Additional treatment options including expectant management, conservative management, medical management were discussed where appropriate.  We reviewed the benefits and risks of each treatment option.   General Surgical Risks: For all procedures, there are risks of bleeding, infection, damage to surrounding organs including but not limited to bowel, bladder, blood vessels, ureters and nerves, and need for further surgery if an injury were to occur. These  risks are all low with minimally invasive surgery.   There are risks of numbness and weakness at any body site or buttock/rectal pain.  It is possible that baseline pain can be worsened by surgery, either with or without mesh. If surgery is vaginal, there is also a low risk of possible conversion to laparoscopy or open abdominal incision where indicated. Very rare risks include blood transfusion, blood clot, heart attack, pneumonia, or death.   There is also a risk of short-term postoperative urinary retention with need to use a catheter. About half of patients need to go home from surgery with a catheter, which is then later removed in the office. The risk of long-term need for a catheter is very low. There is also a risk of worsening of overactive bladder.   Prolapse (with or without mesh): Risk factors for surgical failure  include things that put pressure on your pelvis and the surgical repair, including obesity, chronic cough, and heavy lifting or straining (including lifting children or adults, straining on the toilet, or lifting heavy objects such as furniture or anything weighing >25 lbs. Risks of recurrence is 20-30% with vaginal native tissue repair and a less than 10% with sacrocolpopexy with mesh.    Sacrocolpopexy: Mesh implants may provide more prolapse support, but do have some unique risks to consider. It is important to understand that mesh is permanent and cannot be easily removed. Risks of abdominal sacrocolpopexy mesh include mesh exposure (~3-6%), painful intercourse (recent studies show lower rates after surgery compared to before, with ~5-8% risk of new onset), and very rare risks of bowel or bladder injury or infection (<1%). The risk of mesh exposure is more likely in a woman with risks for poor healing (prior radiation, poorly controlled diabetes, or immunocompromised). The risk of new or worsened chronic pain after mesh implant is more common in women with baseline chronic pain  and/or poorly controlled anxiety or depression. There is an FDA safety notification on vaginal mesh procedures for prolapse but NOT abdominal mesh procedures and therefore does not apply to your surgery. We have extensive experience and training with mesh placement and we have close postoperative follow up to identify any potential complications from mesh.    - For preop Visit:  She is required to have a visit within 30 days of her surgery.   - Medical clearance: PCP clearance Letter sent to Dr Rudolpho requesting risk stratification and medical optimization. - Anticoagulant use: No - Medicaid Hysterectomy form: n/a - Accepts blood transfusion: Yes - Expected length of stay: outpatient  Request sent for surgery scheduling.   Rosaline LOISE Caper, MD

## 2023-11-29 ENCOUNTER — Encounter: Payer: Self-pay | Admitting: Obstetrics and Gynecology

## 2023-12-14 ENCOUNTER — Encounter: Payer: Self-pay | Admitting: Obstetrics and Gynecology

## 2023-12-14 ENCOUNTER — Ambulatory Visit (INDEPENDENT_AMBULATORY_CARE_PROVIDER_SITE_OTHER): Admitting: Obstetrics and Gynecology

## 2023-12-14 VITALS — BP 147/75 | HR 70 | Ht 60.0 in | Wt 183.4 lb

## 2023-12-14 DIAGNOSIS — N993 Prolapse of vaginal vault after hysterectomy: Secondary | ICD-10-CM

## 2023-12-14 DIAGNOSIS — Z01818 Encounter for other preprocedural examination: Secondary | ICD-10-CM | POA: Diagnosis not present

## 2023-12-14 MED ORDER — ACETAMINOPHEN 500 MG PO TABS: 500.0000 mg | ORAL_TABLET | Freq: Four times a day (QID) | ORAL | 0 refills | Status: AC | PRN

## 2023-12-14 MED ORDER — OXYCODONE HCL 5 MG PO TABS: 5.0000 mg | ORAL_TABLET | ORAL | 0 refills | Status: DC | PRN

## 2023-12-14 MED ORDER — POLYETHYLENE GLYCOL 3350 17 GM/SCOOP PO POWD: 17.0000 g | Freq: Every day | ORAL | 0 refills | Status: AC

## 2023-12-14 MED ORDER — IBUPROFEN 600 MG PO TABS
600.0000 mg | ORAL_TABLET | Freq: Four times a day (QID) | ORAL | 0 refills | Status: AC | PRN
Start: 2023-12-14 — End: ?

## 2023-12-14 MED ORDER — ESTRADIOL 0.01 % VA CREA: TOPICAL_CREAM | VAGINAL | 11 refills | Status: AC

## 2023-12-14 NOTE — H&P (Signed)
 [facility] Urogynecology Pre op H&P  Subjective Chief Complaint: Jordan Palmer presents for surgery discussion  History of Present Illness: Jordan Palmer is a 73 y.o. female who presents for follow up. She is scheduled to undergo Exam under anesthesia, robotic assisted sacrocolpopexy, perineorrhaphy, cystoscopy on 12/14/23.  Her symptoms include vaginal bulge, and she was was found to have Stage II anterior, Stage II posterior, Stage I apical prolapse. Has history of two prior prolapse repairs and midurethral sling.   Urodynamic Impression:  1. Sensation was normal; capacity was normal 2. Stress Incontinence was demonstrated at normal pressures; 3. Detrusor Overactivity was demonstrated without leakage. 4. Emptying was dysfunctional with a elevated PVR ( ) , a sustained detrusor contraction present,  abdominal straining present, normal urethral sphincter activity on EMG.  Past Medical History:  Diagnosis Date   Arthritis    oa knees   Cystocele with prolapse    Hyperlipemia    Hypertension    Hypothyroidism    Memory deficit    Sleep apnea    cpap set on 25   SUI (stress urinary incontinence, female)    Varicose veins of left lower extremity    Wears contact lenses      Past Surgical History:  Procedure Laterality Date   ABDOMINAL HYSTERECTOMY     ANTERIOR AND POSTERIOR REPAIR N/A 03/12/2017   Procedure: ANTERIOR (CYSTOCELE) AND POSTERIOR REPAIR (RECTOCELE);  Surgeon: Jertson, Jill Evelyn, MD;  Location: [facility];  Service: Gynecology;  Laterality: N/A;   BLADDER SUSPENSION N/A 05/03/2020   Procedure: TRANSVAGINAL TAPE (TVT) PROCEDURE;  Surgeon: Marilynne Rosaline SAILOR, MD;  Location: [facility];  Service: Gynecology;  Laterality: N/A;   CARPAL TUNNEL RELEASE Bilateral yrs ago   CHOLECYSTECTOMY  2001   laparoscopic   CYSTOCELE REPAIR N/A 05/03/2020   Procedure: ANTERIOR REPAIR (CYSTOCELE);  Surgeon: Marilynne Rosaline SAILOR, MD;   Location: [facility];  Service: Gynecology;  Laterality: N/A;  total time needed for 3 procedures is 1.5hrs   CYSTOSCOPY N/A 03/12/2017   Procedure: CYSTOSCOPY;  Surgeon: Jannis Kate Norris, MD;  Location: [facility];  Service: Gynecology;  Laterality: N/A;   CYSTOSCOPY N/A 05/03/2020   Procedure: CYSTOSCOPY;  Surgeon: Marilynne Rosaline SAILOR, MD;  Location: [facility];  Service: Gynecology;  Laterality: N/A;   KNEE ARTHROSCOPY Left 12/2019   KNEE ARTHROSCOPY WITH MEDIAL MENISECTOMY Right 02/13/2019   Procedure: KNEE ARTHROSCOPY WITH MEDIAL MENISECTOMY;  Surgeon: Marchia Drivers, MD;  Location: [facility];  Service: Orthopedics;  Laterality: Right;   LACRIMAL DUCT PROBING W/ DACRYOPLASTY Bilateral    SALPINGOOPHORECTOMY Left 03/12/2017   Procedure: SALPINGECTOMY;  Surgeon: Jannis Kate Norris, MD;  Location: [facility];  Service: Gynecology;  Laterality: Left;   SHOULDER ARTHROSCOPY WITH OPEN ROTATOR CUFF REPAIR Right 09/03/2014   Procedure: SHOULDER ARTHROSCOPY WITH OPEN ROTATOR CUFF REPAIR;  Surgeon: Drivers Marchia, MD;  Location: [facility];  Service: Orthopedics;  Laterality: Right;   THYROIDECTOMY Right yrs ago   hemi-thyroidectomy   TOTAL KNEE ARTHROPLASTY Right 05/20/2019   Procedure: TOTAL KNEE ARTHROPLASTY;  Surgeon: Marchia Drivers, MD;  Location: [facility];  Service: Orthopedics;  Laterality: Right;   TOTAL KNEE ARTHROPLASTY Left 12/23/2019   Procedure: Left TOTAL KNEE ARTHROPLASTY;  Surgeon: Marchia Drivers, MD;  Location: [facility];  Service: Orthopedics;  Laterality: Left;   VAGINAL HYSTERECTOMY N/A 03/12/2017   Procedure: HYSTERECTOMY VAGINAL;  Surgeon: Jannis Kate Norris, MD;  Location: [facility];  Service: Gynecology;  Laterality: N/A;   VARICOSE VEIN SURGERY      is allergic to codeine and doxycycline.   Family History  Problem Relation Age of Onset   Alzheimer's disease Mother    Diabetes  type II Father    CAD Father    Alzheimer's disease Father    Breast cancer Neg Hx    Bladder Cancer Neg Hx    Renal cancer Neg Hx    Uterine cancer Neg Hx     Social History   Tobacco Use   Smoking status: Never   Smokeless tobacco: Never  Vaping Use   Vaping status: Never Used  Substance Use Topics   Alcohol use: Yes    Comment: occ    Drug use: No     Review of Systems was negative for a full 10 system review except as noted in the History of Present Illness.  No current facility-administered medications for this encounter.  Current Outpatient Medications:    acetaminophen  (TYLENOL ) 500 MG tablet, Take 1 tablet (500 mg total) by mouth every 6 (six) hours as needed (pain)., Disp: 30 tablet, Rfl: 0   Chlorpheniramine Maleate (ALLERGY RELIEF PO), Take by mouth., Disp: , Rfl:    Cyanocobalamin (B-12 PO), Take by mouth., Disp: , Rfl:    donepezil (ARICEPT) 10 MG tablet, Take 10 mg by mouth at bedtime., Disp: , Rfl:    estradiol  (ESTRACE  VAGINAL) 0.1 MG/GM vaginal cream, Place 1 Applicatorful vaginally at bedtime. Apply a dime size amount to biopsy site and affected areas night for 3 weeks then use 3 times a week there after (Patient not taking: Reported on 12/14/2023), Disp: 42.5 g, Rfl: 0   [START ON 12/17/2023] estradiol  (ESTRACE ) 0.01 % CREA vaginal cream, Place 0.5g nightly for two weeks then twice a week after, Disp: 42.5 g, Rfl: 11   ibuprofen  (ADVIL ) 600 MG tablet, Take 1 tablet (600 mg total) by mouth every 6 (six) hours as needed., Disp: 30 tablet, Rfl: 0   levothyroxine  (SYNTHROID ) 88 MCG tablet, Take 88 mcg by mouth daily before breakfast., Disp: , Rfl:    lisinopril -hydrochlorothiazide  (PRINZIDE ,ZESTORETIC ) 20-25 MG per tablet, Take 1 tablet by mouth daily., Disp: , Rfl:    memantine (NAMENDA) 5 MG tablet, Take 5 mg by mouth daily., Disp: , Rfl:    ondansetron  (ZOFRAN ) 4 MG tablet, Take 1 tablet (4 mg total) by mouth every 8 (eight) hours as needed., Disp: 15 tablet,  Rfl: 0   oxyCODONE  (OXY IR/ROXICODONE ) 5 MG immediate release tablet, Take 1 tablet (5 mg total) by mouth every 4 (four) hours as needed for severe pain (pain score 7-10)., Disp: 15 tablet, Rfl: 0   polyethylene glycol powder (GLYCOLAX /MIRALAX ) 17 GM/SCOOP powder, Take 17 g by mouth daily. Drink 17g (1 scoop) dissolved in water per day., Disp: 255 g, Rfl: 0   rosuvastatin  (CRESTOR ) 5 MG tablet, Take 5 mg by mouth daily at 2 PM. Takes at 200 pm, Disp: , Rfl:    Objective There were no vitals filed for this visit.   Gen: NAD CV: S1 S2 RRR Lungs: Clear to auscultation bilaterally Abd: soft, nontender   Previous Pelvic Exam showed: POP-Q   -0.5                                            Aa   -0.5  Ba   -7                                              C    4                                            Gh   3                                            Pb   7.5                                            tvl    -1.5                                            Ap   -1.5                                            Bp                                                  D       Assessment/ Plan  The patient is a 73 y.o. year old with stage II recurrent POP scheduled to undergo Exam under anesthesia, robotic assisted sacrocolpopexy, perineorrhaphy, cystoscopy.    Rosaline LOISE Caper, MD

## 2023-12-14 NOTE — Progress Notes (Signed)
 Jordan Palmer Return visit  Subjective Chief Complaint: Jordan Palmer presents for surgery discussion  History of Present Illness: Jordan Palmer is a 73 y.o. female who presents for follow up. She is scheduled to undergo Exam under anesthesia, robotic assisted sacrocolpopexy, perineorrhaphy, cystoscopy on 12/14/23.  Her symptoms include vaginal bulge, and she was was found to have Stage II anterior, Stage II posterior, Stage I apical prolapse. Has history of two prior prolapse repairs and midurethral sling.   Urodynamic Impression:  1. Sensation was normal; capacity was normal 2. Stress Incontinence was demonstrated at normal pressures; 3. Detrusor Overactivity was demonstrated without leakage. 4. Emptying was dysfunctional with a elevated PVR ( ) , a sustained detrusor contraction present,  abdominal straining present, normal urethral sphincter activity on EMG.  Past Medical History:  Diagnosis Date   Arthritis    oa knees   Cystocele with prolapse    Hyperlipemia    Hypertension    Hypothyroidism    Memory deficit    Sleep apnea    cpap set on 25   SUI (stress urinary incontinence, female)    Varicose veins of left lower extremity    Wears contact lenses      Past Surgical History:  Procedure Laterality Date   ABDOMINAL HYSTERECTOMY     ANTERIOR AND POSTERIOR REPAIR N/A 03/12/2017   Procedure: ANTERIOR (CYSTOCELE) AND POSTERIOR REPAIR (RECTOCELE);  Surgeon: Jertson, Jill Evelyn, MD;  Location: [facility];  Service: Gynecology;  Laterality: N/A;   BLADDER SUSPENSION N/A 05/03/2020   Procedure: TRANSVAGINAL TAPE (TVT) PROCEDURE;  Surgeon: Marilynne Rosaline SAILOR, MD;  Location: [facility];  Service: Gynecology;  Laterality: N/A;   CARPAL TUNNEL RELEASE Bilateral yrs ago   CHOLECYSTECTOMY  2001   laparoscopic   CYSTOCELE REPAIR N/A 05/03/2020   Procedure: ANTERIOR REPAIR (CYSTOCELE);  Surgeon: Marilynne Rosaline SAILOR, MD;   Location: [facility];  Service: Gynecology;  Laterality: N/A;  total time needed for 3 procedures is 1.5hrs   CYSTOSCOPY N/A 03/12/2017   Procedure: CYSTOSCOPY;  Surgeon: Jannis Kate Norris, MD;  Location: [facility];  Service: Gynecology;  Laterality: N/A;   CYSTOSCOPY N/A 05/03/2020   Procedure: CYSTOSCOPY;  Surgeon: Marilynne Rosaline SAILOR, MD;  Location: [facility];  Service: Gynecology;  Laterality: N/A;   KNEE ARTHROSCOPY Left 12/2019   KNEE ARTHROSCOPY WITH MEDIAL MENISECTOMY Right 02/13/2019   Procedure: KNEE ARTHROSCOPY WITH MEDIAL MENISECTOMY;  Surgeon: Marchia Drivers, MD;  Location: [facility];  Service: Orthopedics;  Laterality: Right;   LACRIMAL DUCT PROBING W/ DACRYOPLASTY Bilateral    SALPINGOOPHORECTOMY Left 03/12/2017   Procedure: SALPINGECTOMY;  Surgeon: Jannis Kate Norris, MD;  Location: [facility];  Service: Gynecology;  Laterality: Left;   SHOULDER ARTHROSCOPY WITH OPEN ROTATOR CUFF REPAIR Right 09/03/2014   Procedure: SHOULDER ARTHROSCOPY WITH OPEN ROTATOR CUFF REPAIR;  Surgeon: Drivers Marchia, MD;  Location: [facility];  Service: Orthopedics;  Laterality: Right;   THYROIDECTOMY Right yrs ago   hemi-thyroidectomy   TOTAL KNEE ARTHROPLASTY Right 05/20/2019   Procedure: TOTAL KNEE ARTHROPLASTY;  Surgeon: Marchia Drivers, MD;  Location: [facility];  Service: Orthopedics;  Laterality: Right;   TOTAL KNEE ARTHROPLASTY Left 12/23/2019   Procedure: Left TOTAL KNEE ARTHROPLASTY;  Surgeon: Marchia Drivers, MD;  Location: [facility];  Service: Orthopedics;  Laterality: Left;   VAGINAL HYSTERECTOMY N/A 03/12/2017   Procedure: HYSTERECTOMY VAGINAL;  Surgeon: Jannis Kate Norris, MD;  Location: [facility];  Service:  Gynecology;  Laterality: N/A;   VARICOSE VEIN SURGERY      is allergic to codeine and doxycycline.   Family History  Problem Relation Age of Onset   Alzheimer's disease Mother    Diabetes  type II Father    CAD Father    Alzheimer's disease Father    Breast cancer Neg Hx    Bladder Cancer Neg Hx    Renal cancer Neg Hx    Uterine cancer Neg Hx     Social History   Tobacco Use   Smoking status: Never   Smokeless tobacco: Never  Vaping Use   Vaping status: Never Used  Substance Use Topics   Alcohol use: Yes    Comment: occ    Drug use: No     Review of Systems was negative for a full 10 system review except as noted in the History of Present Illness.   Current Outpatient Medications:    Chlorpheniramine Maleate (ALLERGY RELIEF PO), Take by mouth., Disp: , Rfl:    Cyanocobalamin (B-12 PO), Take by mouth., Disp: , Rfl:    donepezil (ARICEPT) 10 MG tablet, Take 10 mg by mouth at bedtime., Disp: , Rfl:    levothyroxine  (SYNTHROID ) 88 MCG tablet, Take 88 mcg by mouth daily before breakfast., Disp: , Rfl:    lisinopril -hydrochlorothiazide  (PRINZIDE ,ZESTORETIC ) 20-25 MG per tablet, Take 1 tablet by mouth daily., Disp: , Rfl:    memantine (NAMENDA) 5 MG tablet, Take 5 mg by mouth daily., Disp: , Rfl:    ondansetron  (ZOFRAN ) 4 MG tablet, Take 1 tablet (4 mg total) by mouth every 8 (eight) hours as needed., Disp: 15 tablet, Rfl: 0   rosuvastatin  (CRESTOR ) 5 MG tablet, Take 5 mg by mouth daily at 2 PM. Takes at 200 pm, Disp: , Rfl:    estradiol  (ESTRACE  VAGINAL) 0.1 MG/GM vaginal cream, Place 1 Applicatorful vaginally at bedtime. Apply a dime size amount to biopsy site and affected areas night for 3 weeks then use 3 times a week there after (Patient not taking: Reported on 12/14/2023), Disp: 42.5 g, Rfl: 0   Objective Vitals:   12/14/23 1018  BP: (!) 147/75  Pulse: 70    Gen: NAD CV: S1 S2 RRR Lungs: Clear to auscultation bilaterally Abd: soft, nontender   Previous Pelvic Exam showed: POP-Q   -0.5                                            Aa   -0.5                                           Ba   -7                                              C    4                                             Gh   3  Pb   7.5                                            tvl    -1.5                                            Ap   -1.5                                            Bp                                                  D       Assessment/ Plan  Assessment: The patient is a 73 y.o. year old scheduled to undergo Exam under anesthesia, robotic assisted sacrocolpopexy, perineorrhaphy, cystoscopy.   Plan: General Surgical Consent: The patient has previously been counseled on alternative treatments, and the decision by the patient and provider was to proceed with the procedure listed above.  For all procedures, there are risks of bleeding, infection, damage to surrounding organs including but not limited to bowel, bladder, blood vessels, ureters and nerves, and need for further surgery if an injury were to occur. These risks are all low with minimally invasive surgery.   There are risks of numbness and weakness at any body site or buttock/rectal pain.  It is possible that baseline pain can be worsened by surgery, either with or without mesh. If surgery is vaginal, there is also a low risk of possible conversion to laparoscopy or open abdominal incision where indicated. Very rare risks include blood transfusion, blood clot, heart attack, pneumonia, or death.   There is also a risk of short-term postoperative urinary retention with need to use a catheter. About half of patients need to go home from surgery with a catheter, which is then later removed in the office. The risk of long-term need for a catheter is very low. There is also a risk of worsening of overactive bladder.    Prolapse (with or without mesh): Risk factors for surgical failure  include things that put pressure on your pelvis and the surgical repair, including obesity, chronic cough, and heavy lifting or straining (including lifting children or  adults, straining on the toilet, or lifting heavy objects such as furniture or anything weighing >25 lbs. Risks of recurrence is 20-30% with vaginal native tissue repair and a less than 10% with sacrocolpopexy with mesh.    Sacrocolpopexy: Mesh implants may provide more prolapse support, but do have some unique risks to consider. It is important to understand that mesh is permanent and cannot be easily removed. Risks of abdominal sacrocolpopexy mesh include mesh exposure (~3-6%), painful intercourse (recent studies show lower rates after surgery compared to before, with ~5-8% risk of new onset), and very rare risks of bowel or bladder injury or infection (<1%). The risk of mesh exposure is more likely in a woman with risks for poor healing (prior radiation, poorly controlled diabetes, or immunocompromised). The risk of  new or worsened chronic pain after mesh implant is more common in women with baseline chronic pain and/or poorly controlled anxiety or depression. There is an FDA safety notification on vaginal mesh procedures for prolapse but NOT abdominal mesh procedures and therefore does not apply to your surgery. We have extensive experience and training with mesh placement and we have close postoperative follow up to identify any potential complications from mesh.    We discussed consent for blood products. Risks for blood transfusion include allergic reactions, other reactions that can affect different body organs and managed accordingly, transmission of infectious diseases such as HIV or Hepatitis. However, the blood is screened. Patient consents for blood products.  Pre-operative instructions:  She was instructed to not take Aspirin /NSAIDs x 7days prior to surgery.  Antibiotic prophylaxis was ordered as indicated.  Catheter use: Patient will go home with foley if needed after post-operative voiding trial.  Post-operative instructions:  She was provided with specific post-operative instructions,  including precautions and signs/symptoms for which we would recommend contacting us , in addition to daytime and after-hours contact phone numbers. This was provided on a handout.   Post-operative medications: Prescriptions for motrin , tylenol , miralax , and oxycodone  were sent to her pharmacy. Discussed using ibuprofen  and tylenol  on a schedule to limit use of narcotics.   Laboratory testing:  We will check labs: CBC, BMP, type and screen  Preoperative clearance:  She does not require surgical clearance.    Post-operative follow-up:  A post-operative appointment will be made for 6 weeks from the date of surgery. If she needs a post-operative nurse visit for a voiding trial, that will be set up after she leaves the hospital.    Patient will call the clinic or use MyChart should anything change or any new issues arise.   Rosaline LOISE Caper, MD

## 2023-12-26 ENCOUNTER — Other Ambulatory Visit (INDEPENDENT_AMBULATORY_CARE_PROVIDER_SITE_OTHER): Payer: Self-pay | Admitting: Nurse Practitioner

## 2023-12-26 DIAGNOSIS — I83812 Varicose veins of left lower extremities with pain: Secondary | ICD-10-CM

## 2024-01-01 ENCOUNTER — Encounter (HOSPITAL_COMMUNITY): Payer: Self-pay | Admitting: Obstetrics and Gynecology

## 2024-01-01 NOTE — Progress Notes (Signed)
 Spoke w/ via phone for pre-op interview--- [employer]----  T&S, CBC and BMP per careers adviser. EKG per anesthesia.       Lab results------ COVID test -----patient states asymptomatic no test needed Arrive at -------0530 NPO after MN NO Solid Food.   Pre-Surgery Ensure or G2:  Med rec completed Medications to take morning of surgery -----Levothyroxine  Diabetic medication -----  GLP1 agonist last dose: GLP1 instructions:  Patient instructed no nail polish to be worn day of surgery Patient instructed to bring photo id and insurance card day of surgery Patient aware to have Driver (ride ) / caregiver    for 24 hours after surgery - Husband Glendia Feeling Patient Special Instructions ----- Pre-Op special Instructions -----  Patient verbalized understanding of instructions that were given at this phone interview. Patient denies chest pain, sob, fever, cough at the interview.

## 2024-01-04 ENCOUNTER — Ambulatory Visit (INDEPENDENT_AMBULATORY_CARE_PROVIDER_SITE_OTHER): Admitting: Nurse Practitioner

## 2024-01-04 ENCOUNTER — Encounter (INDEPENDENT_AMBULATORY_CARE_PROVIDER_SITE_OTHER): Payer: Self-pay

## 2024-01-04 ENCOUNTER — Encounter (INDEPENDENT_AMBULATORY_CARE_PROVIDER_SITE_OTHER)

## 2024-01-07 NOTE — Anesthesia Preprocedure Evaluation (Addendum)
 Anesthesia Evaluation  Patient identified by MRN, date of birth, ID band Patient awake    Reviewed: Allergy & Precautions, NPO status , Patient's Chart, lab work & pertinent test results  History of Anesthesia Complications Negative for: history of anesthetic complications  Airway Mallampati: IV  TM Distance: >3 FB Neck ROM: Full    Dental no notable dental hx.    Pulmonary sleep apnea and Continuous Positive Airway Pressure Ventilation    Pulmonary exam normal        Cardiovascular hypertension, Pt. on medications Normal cardiovascular exam     Neuro/Psych negative neurological ROS     GI/Hepatic negative GI ROS, Neg liver ROS,,,  Endo/Other  Hypothyroidism    Renal/GU negative Renal ROS   vaginal vault prolapse    Musculoskeletal  (+) Arthritis ,    Abdominal   Peds  Hematology negative hematology ROS (+)   Anesthesia Other Findings   Reproductive/Obstetrics                              Anesthesia Physical Anesthesia Plan  ASA: 2  Anesthesia Plan: General   Post-op Pain Management: Tylenol  PO (pre-op)*, Dilaudid  IV and Precedex    Induction: Intravenous  PONV Risk Score and Plan: 3 and Treatment may vary due to age or medical condition, Ondansetron , Dexamethasone , Midazolam , Propofol  infusion and TIVA  Airway Management Planned: Oral ETT  Additional Equipment: None  Intra-op Plan:   Post-operative Plan: Extubation in OR  Informed Consent: I have reviewed the patients History and Physical, chart, labs and discussed the procedure including the risks, benefits and alternatives for the proposed anesthesia with the patient or authorized representative who has indicated his/her understanding and acceptance.     Dental advisory given  Plan Discussed with: CRNA  Anesthesia Plan Comments:          Anesthesia Quick Evaluation

## 2024-01-08 ENCOUNTER — Ambulatory Visit (HOSPITAL_COMMUNITY)
Admission: RE | Admit: 2024-01-08 | Discharge: 2024-01-08 | Disposition: A | Attending: Obstetrics and Gynecology | Admitting: Obstetrics and Gynecology

## 2024-01-08 ENCOUNTER — Ambulatory Visit (HOSPITAL_COMMUNITY): Admitting: Anesthesiology

## 2024-01-08 ENCOUNTER — Other Ambulatory Visit: Payer: Self-pay

## 2024-01-08 ENCOUNTER — Encounter (HOSPITAL_COMMUNITY): Admission: RE | Disposition: A | Payer: Self-pay | Source: Home / Self Care | Attending: Obstetrics and Gynecology

## 2024-01-08 ENCOUNTER — Encounter (HOSPITAL_COMMUNITY): Payer: Self-pay | Admitting: Obstetrics and Gynecology

## 2024-01-08 DIAGNOSIS — N993 Prolapse of vaginal vault after hysterectomy: Secondary | ICD-10-CM

## 2024-01-08 DIAGNOSIS — I10 Essential (primary) hypertension: Secondary | ICD-10-CM

## 2024-01-08 HISTORY — PX: PERINEOPLASTY: SHX2218

## 2024-01-08 HISTORY — PX: ROBOTIC ASSISTED LAPAROSCOPIC SACROCOLPOPEXY: SHX5388

## 2024-01-08 HISTORY — PX: ROBOTIC ASSISTED LAPAROSCOPIC LYSIS OF ADHESION: SHX6080

## 2024-01-08 HISTORY — PX: EXAM UNDER ANESTHESIA, PELVIC: SHX7461

## 2024-01-08 HISTORY — PX: CYSTOSCOPY: SHX5120

## 2024-01-08 LAB — CBC
HCT: 37.2 % (ref 36.0–46.0)
Hemoglobin: 12.9 g/dL (ref 12.0–15.0)
MCH: 35.1 pg — ABNORMAL HIGH (ref 26.0–34.0)
MCHC: 34.7 g/dL (ref 30.0–36.0)
MCV: 101.1 fL — ABNORMAL HIGH (ref 80.0–100.0)
Platelets: 238 K/uL (ref 150–400)
RBC: 3.68 MIL/uL — ABNORMAL LOW (ref 3.87–5.11)
RDW: 13.2 % (ref 11.5–15.5)
WBC: 7 K/uL (ref 4.0–10.5)
nRBC: 0 % (ref 0.0–0.2)

## 2024-01-08 LAB — TYPE AND SCREEN
ABO/RH(D): A NEG
Antibody Screen: NEGATIVE

## 2024-01-08 LAB — BASIC METABOLIC PANEL WITH GFR
Anion gap: 19 — ABNORMAL HIGH (ref 5–15)
BUN: 26 mg/dL — ABNORMAL HIGH (ref 8–23)
CO2: 19 mmol/L — ABNORMAL LOW (ref 22–32)
Calcium: 9.5 mg/dL (ref 8.9–10.3)
Chloride: 99 mmol/L (ref 98–111)
Creatinine, Ser: 1.35 mg/dL — ABNORMAL HIGH (ref 0.44–1.00)
GFR, Estimated: 41 mL/min — ABNORMAL LOW (ref >60)
Glucose, Bld: 108 mg/dL — ABNORMAL HIGH (ref 70–99)
Potassium: 4 mmol/L (ref 3.5–5.1)
Sodium: 137 mmol/L (ref 135–145)

## 2024-01-08 SURGERY — SACROCOLPOPEXY, ROBOT-ASSISTED, LAPAROSCOPIC
Anesthesia: General

## 2024-01-08 MED ORDER — LACTATED RINGERS IV SOLN: INTRAVENOUS | Status: DC | PRN

## 2024-01-08 MED ORDER — OXYCODONE HCL 5 MG PO TABS
ORAL_TABLET | ORAL | Status: AC
  Filled 2024-01-08: qty 1

## 2024-01-08 MED ORDER — OXYCODONE HCL 5 MG/5ML PO SOLN: 5.0000 mg | Freq: Once | ORAL | Status: AC | PRN

## 2024-01-08 MED ORDER — EPHEDRINE 5 MG/ML INJ
INTRAVENOUS | Status: AC
  Filled 2024-01-08: qty 5

## 2024-01-08 MED ORDER — SODIUM CHLORIDE 0.9 % IR SOLN
Status: DC | PRN
  Administered 2024-01-08 (×2): 1000 mL

## 2024-01-08 MED ORDER — PHENAZOPYRIDINE HCL 100 MG PO TABS
ORAL_TABLET | ORAL | Status: AC
  Administered 2024-01-08: 200 mg via ORAL
  Filled 2024-01-08: qty 2

## 2024-01-08 MED ORDER — DEXMEDETOMIDINE HCL IN NACL 80 MCG/20ML IV SOLN
INTRAVENOUS | Status: DC | PRN
  Administered 2024-01-08: 8 ug via INTRAVENOUS

## 2024-01-08 MED ORDER — POVIDONE-IODINE 10 % EX SWAB: 2.0000 | Freq: Once | CUTANEOUS | Status: DC

## 2024-01-08 MED ORDER — LIDOCAINE-EPINEPHRINE 1 %-1:100000 IJ SOLN
INTRAMUSCULAR | Status: DC | PRN
  Administered 2024-01-08: 6 mL

## 2024-01-08 MED ORDER — PROPOFOL 500 MG/50ML IV EMUL
INTRAVENOUS | Status: DC | PRN
  Administered 2024-01-08 (×2): 150 ug/kg/min via INTRAVENOUS
  Administered 2024-01-08 (×2): 160 ug/kg/min via INTRAVENOUS
  Administered 2024-01-08: 150 ug/kg/min via INTRAVENOUS

## 2024-01-08 MED ORDER — CHLORHEXIDINE GLUCONATE 0.12 % MT SOLN: 15.0000 mL | Freq: Once | OROMUCOSAL | Status: AC

## 2024-01-08 MED ORDER — ROCURONIUM BROMIDE 10 MG/ML (PF) SYRINGE
PREFILLED_SYRINGE | INTRAVENOUS | Status: DC | PRN
  Administered 2024-01-08: 20 mg via INTRAVENOUS
  Administered 2024-01-08: 30 mg via INTRAVENOUS
  Administered 2024-01-08 (×2): 20 mg via INTRAVENOUS
  Administered 2024-01-08: 60 mg via INTRAVENOUS

## 2024-01-08 MED ORDER — ORAL CARE MOUTH RINSE: 15.0000 mL | Freq: Once | OROMUCOSAL | Status: AC

## 2024-01-08 MED ORDER — ENOXAPARIN SODIUM 40 MG/0.4ML IJ SOSY: 40.0000 mg | PREFILLED_SYRINGE | INTRAMUSCULAR | Status: AC

## 2024-01-08 MED ORDER — SUGAMMADEX SODIUM 200 MG/2ML IV SOLN
INTRAVENOUS | Status: DC | PRN
  Administered 2024-01-08: 100 mg via INTRAVENOUS
  Administered 2024-01-08: 200 mg via INTRAVENOUS

## 2024-01-08 MED ORDER — PHENYLEPHRINE HCL-NACL 20-0.9 MG/250ML-% IV SOLN
INTRAVENOUS | Status: DC | PRN
  Administered 2024-01-08: 25 ug/min via INTRAVENOUS

## 2024-01-08 MED ORDER — LIDOCAINE 2% (20 MG/ML) 5 ML SYRINGE
INTRAMUSCULAR | Status: DC | PRN
  Administered 2024-01-08: 20 mg via INTRAVENOUS

## 2024-01-08 MED ORDER — LACTATED RINGERS IV SOLN: INTRAVENOUS | Status: DC

## 2024-01-08 MED ORDER — PROPOFOL 10 MG/ML IV BOLUS
INTRAVENOUS | Status: AC
  Filled 2024-01-08: qty 20

## 2024-01-08 MED ORDER — OXYCODONE HCL 5 MG PO TABS
5.0000 mg | ORAL_TABLET | Freq: Once | ORAL | Status: AC | PRN
  Administered 2024-01-08: 5 mg via ORAL

## 2024-01-08 MED ORDER — FENTANYL CITRATE (PF) 250 MCG/5ML IJ SOLN
INTRAMUSCULAR | Status: AC
  Filled 2024-01-08: qty 5

## 2024-01-08 MED ORDER — 0.9 % SODIUM CHLORIDE (POUR BTL) OPTIME
TOPICAL | Status: DC | PRN
  Administered 2024-01-08: 1000 mL

## 2024-01-08 MED ORDER — PHENAZOPYRIDINE HCL 100 MG PO TABS: 200.0000 mg | ORAL_TABLET | ORAL | Status: AC

## 2024-01-08 MED ORDER — FENTANYL CITRATE (PF) 250 MCG/5ML IJ SOLN
INTRAMUSCULAR | Status: DC | PRN
  Administered 2024-01-08: 50 ug via INTRAVENOUS
  Administered 2024-01-08: 100 ug via INTRAVENOUS

## 2024-01-08 MED ORDER — CEFAZOLIN SODIUM-DEXTROSE 2-4 GM/100ML-% IV SOLN
2.0000 g | INTRAVENOUS | Status: AC
  Administered 2024-01-08: 2 g via INTRAVENOUS

## 2024-01-08 MED ORDER — ACETAMINOPHEN 500 MG PO TABS
1000.0000 mg | ORAL_TABLET | Freq: Once | ORAL | Status: AC
  Administered 2024-01-08: 1000 mg via ORAL

## 2024-01-08 MED ORDER — BUPIVACAINE HCL (PF) 0.25 % IJ SOLN
INTRAMUSCULAR | Status: AC
  Filled 2024-01-08: qty 30

## 2024-01-08 MED ORDER — DEXAMETHASONE SOD PHOSPHATE PF 10 MG/ML IJ SOLN
INTRAMUSCULAR | Status: DC | PRN
  Administered 2024-01-08: 10 mg via INTRAVENOUS

## 2024-01-08 MED ORDER — ONDANSETRON HCL 4 MG/2ML IJ SOLN
INTRAMUSCULAR | Status: AC
  Filled 2024-01-08: qty 2

## 2024-01-08 MED ORDER — BUPIVACAINE HCL (PF) 0.25 % IJ SOLN
INTRAMUSCULAR | Status: DC | PRN
  Administered 2024-01-08: 18 mL

## 2024-01-08 MED ORDER — ONDANSETRON HCL 4 MG/2ML IJ SOLN
INTRAMUSCULAR | Status: DC | PRN
  Administered 2024-01-08: 4 mg via INTRAVENOUS

## 2024-01-08 MED ORDER — PROPOFOL 10 MG/ML IV BOLUS
INTRAVENOUS | Status: DC | PRN
  Administered 2024-01-08 (×2): 40 mg via INTRAVENOUS
  Administered 2024-01-08: 160 mg via INTRAVENOUS

## 2024-01-08 MED ORDER — FENTANYL CITRATE (PF) 100 MCG/2ML IJ SOLN: 25.0000 ug | INTRAMUSCULAR | Status: DC | PRN

## 2024-01-08 MED ORDER — CEFAZOLIN SODIUM-DEXTROSE 2-4 GM/100ML-% IV SOLN
INTRAVENOUS | Status: AC
  Filled 2024-01-08: qty 100

## 2024-01-08 MED ORDER — ACETAMINOPHEN 500 MG PO TABS
ORAL_TABLET | ORAL | Status: AC
  Administered 2024-01-08: 1000 mg via ORAL
  Filled 2024-01-08: qty 2

## 2024-01-08 MED ORDER — CHLORHEXIDINE GLUCONATE 0.12 % MT SOLN
OROMUCOSAL | Status: AC
  Administered 2024-01-08: 15 mL via OROMUCOSAL
  Filled 2024-01-08: qty 15

## 2024-01-08 MED ORDER — PROPOFOL 1000 MG/100ML IV EMUL
INTRAVENOUS | Status: AC
  Filled 2024-01-08: qty 100

## 2024-01-08 MED ORDER — EPHEDRINE SULFATE-NACL 50-0.9 MG/10ML-% IV SOSY
PREFILLED_SYRINGE | INTRAVENOUS | Status: DC | PRN
  Administered 2024-01-08 (×2): 5 mg via INTRAVENOUS

## 2024-01-08 MED ORDER — ENOXAPARIN SODIUM 40 MG/0.4ML IJ SOSY
PREFILLED_SYRINGE | INTRAMUSCULAR | Status: AC
  Administered 2024-01-08: 40 mg via SUBCUTANEOUS
  Filled 2024-01-08: qty 0.4

## 2024-01-08 MED ORDER — LIDOCAINE-EPINEPHRINE 1 %-1:100000 IJ SOLN
INTRAMUSCULAR | Status: AC
  Filled 2024-01-08: qty 2

## 2024-01-08 MED ORDER — LIDOCAINE 2% (20 MG/ML) 5 ML SYRINGE
INTRAMUSCULAR | Status: AC
  Filled 2024-01-08: qty 5

## 2024-01-08 MED ORDER — ROCURONIUM BROMIDE 10 MG/ML (PF) SYRINGE
PREFILLED_SYRINGE | INTRAVENOUS | Status: AC
  Filled 2024-01-08: qty 10

## 2024-01-08 MED ORDER — ACETAMINOPHEN 500 MG PO TABS: 1000.0000 mg | ORAL_TABLET | ORAL | Status: AC

## 2024-01-08 MED ORDER — OXYCODONE HCL 5 MG PO TABS
5.0000 mg | ORAL_TABLET | Freq: Once | ORAL | Status: AC
  Administered 2024-01-08: 5 mg via ORAL

## 2024-01-08 MED ORDER — DROPERIDOL 2.5 MG/ML IJ SOLN: 0.6250 mg | Freq: Once | INTRAMUSCULAR | Status: DC | PRN

## 2024-01-08 MED ORDER — ACETAMINOPHEN 500 MG PO TABS
ORAL_TABLET | ORAL | Status: AC
  Filled 2024-01-08: qty 2

## 2024-01-08 SURGICAL SUPPLY — 61 items
APPLICATOR SURGIFLO ENDO (HEMOSTASIS) IMPLANT
BLADE SURG 15 STRL LF DISP TIS (BLADE) ×2 IMPLANT
CHLORAPREP W/TINT 26 (MISCELLANEOUS) ×2 IMPLANT
COVER BACK TABLE 60X90IN (DRAPES) ×2 IMPLANT
COVER MAYO STAND STRL (DRAPES) ×2 IMPLANT
COVER TIP SHEARS 8 DVNC (MISCELLANEOUS) ×2 IMPLANT
DEFOGGER SCOPE WARM SEASHARP (MISCELLANEOUS) ×2 IMPLANT
DERMABOND ADVANCED .7 DNX12 (GAUZE/BANDAGES/DRESSINGS) ×2 IMPLANT
DRAPE ARM DVNC X/XI (DISPOSABLE) ×8 IMPLANT
DRAPE COLUMN DVNC XI (DISPOSABLE) ×2 IMPLANT
DRAPE SHEET LG 3/4 BI-LAMINATE (DRAPES) ×2 IMPLANT
DRAPE SURG IRRIG POUCH 19X23 (DRAPES) ×2 IMPLANT
DRAPE UTILITY XL STRL (DRAPES) ×2 IMPLANT
DRIVER NDL LRG 8 DVNC XI (INSTRUMENTS) IMPLANT
DRIVER NDL MEGA SUTCUT DVNCXI (INSTRUMENTS) IMPLANT
ELECTRODE REM PT RTRN 9FT ADLT (ELECTROSURGICAL) ×2 IMPLANT
FORCEPS BPLR FENES DVNC XI (FORCEP) IMPLANT
GAUZE 4X4 16PLY ~~LOC~~+RFID DBL (SPONGE) IMPLANT
GLOVE BIOGEL PI IND STRL 6.5 (GLOVE) ×2 IMPLANT
GLOVE BIOGEL PI IND STRL 7.0 (GLOVE) ×4 IMPLANT
GLOVE BIOGEL PI MICRO STRL 6 (GLOVE) ×6 IMPLANT
GLOVE SURG UNDER POLY LF SZ6.5 (GLOVE) ×8 IMPLANT
GOWN STRL REUS W/ TWL LRG LVL3 (GOWN DISPOSABLE) ×2 IMPLANT
GOWN STRL REUS W/TWL LRG LVL3 (GOWN DISPOSABLE) ×2 IMPLANT
GRASPER TIP-UP FEN DVNC XI (INSTRUMENTS) IMPLANT
HIBICLENS CHG 4% 4OZ (MISCELLANEOUS) ×2 IMPLANT
HOLDER FOLEY CATH W/STRAP (MISCELLANEOUS) ×2 IMPLANT
IRRIGATION SUCT STRKRFLW 2 WTP (MISCELLANEOUS) ×2 IMPLANT
IRRIGATOR SUCT 8 DISP DVNC XI (IRRIGATION / IRRIGATOR) IMPLANT
IV 0.9% NACL 1000 ML (IV SOLUTION) IMPLANT
KIT PINK PAD W/HEAD ARM REST (MISCELLANEOUS) ×2 IMPLANT
KIT TURNOVER KIT B (KITS) ×2 IMPLANT
LEGGING LITHOTOMY PAIR STRL (DRAPES) ×2 IMPLANT
MANIFOLD NEPTUNE II (INSTRUMENTS) ×2 IMPLANT
MESH VERTESSA LITE -Y 2X4X3 (Mesh General) ×2 IMPLANT
NDL HYPO 22X1.5 SAFETY MO (MISCELLANEOUS) ×2 IMPLANT
NDL INSUFFLATION 14GA 120MM (NEEDLE) ×2 IMPLANT
OBTURATOR OPTICALSTD 8 DVNC (TROCAR) ×2 IMPLANT
PACK ROBOT WH (CUSTOM PROCEDURE TRAY) ×2 IMPLANT
PACK ROBOTIC GOWN (GOWN DISPOSABLE) ×2 IMPLANT
PAD OB MATERNITY 11 LF (PERSONAL CARE ITEMS) ×2 IMPLANT
PATTIES SURGICAL .5 X3 (DISPOSABLE) IMPLANT
SCISSORS MNPLR CVD DVNC XI (INSTRUMENTS) IMPLANT
SEAL UNIV 5-12 XI (MISCELLANEOUS) ×10 IMPLANT
SET IRRIG Y-TYPE CYSTO (SET/KITS/TRAYS/PACK) ×2 IMPLANT
SET TUBE SMOKE EVAC HIGH FLOW (TUBING) ×2 IMPLANT
SLEEVE SCD COMPRESS KNEE MED (STOCKING) ×2 IMPLANT
SOLN 0.9% NACL POUR BTL 1000ML (IV SOLUTION) ×2 IMPLANT
SUCTION TUBE FRAZIER 10FR DISP (SUCTIONS) ×2 IMPLANT
SURGIFLO W/THROMBIN 8M KIT (HEMOSTASIS) IMPLANT
SUT GORETEX NAB #0 THX26 36IN (SUTURE) ×4 IMPLANT
SUT MNCRL AB 4-0 PS2 18 (SUTURE) ×4 IMPLANT
SUT MON AB 2-0 SH 27 (SUTURE) ×2 IMPLANT
SUT VIC AB 0 CT1 27XBRD ANBCTR (SUTURE) ×2 IMPLANT
SUT VIC AB 2-0 SH 27XBRD (SUTURE) ×2 IMPLANT
SUT VICRYL 2-0 SH 8X27 (SUTURE) IMPLANT
SUTURE STRAT PDS 2-0 23 CT-2.5 (SUTURE) ×4 IMPLANT
SYR BULB EAR ULCER 3OZ GRN STR (SYRINGE) ×2 IMPLANT
TOWEL GREEN STERILE (TOWEL DISPOSABLE) ×2 IMPLANT
TOWEL GREEN STERILE FF (TOWEL DISPOSABLE) ×2 IMPLANT
TRAY FOLEY W/BAG SLVR 14FR (SET/KITS/TRAYS/PACK) IMPLANT

## 2024-01-08 NOTE — Op Note (Signed)
 Operative Note  Preoperative Diagnosis: anterior vaginal prolapse, posterior vaginal prolapse, and vaginal vault prolapse after hysterectomy  Postoperative Diagnosis: same  Procedures performed:  Robotic assisted lysis of adhesions, sacrocolpopexy Jordan Palmer), cystoscopy, perineorrhaphy  Implants:  Implant Name Type Inv. Item Serial No. Manufacturer Lot No. LRB No. Used Action  MESH Jordan Palmer SE [id number] Mesh General MESH Jordan Palmer SE NITA  [facility] MEDICAL [id number]  1 Implanted    Attending Surgeon: Jordan Caper, MD  Assistant: Jorene Moats, PA  Anesthesia: General endotracheal  Findings: 1. On vaginal exam, stage II prolapse present  2. On cystoscopy, normal bladder and urethral mucosa without injury or lesion. Brisk bilateral ureteral efflux present.    3. On laparoscopy, adhesions noted of the sigmoid to the vaginal cuff.   Specimens: * No specimens in log *  Estimated blood loss: 75 mL  IV fluids: 1000 mL  Urine output: 350 mL  Complications: none  Indications: [name] with stage II recurrent prolapse. She has had two prior anterior repairs and was bothered by her recurrence. We discussed improved success of prolapse repair with mesh placement.   Procedure in Detail:  After informed consent was obtained, the patient was taken to the operating room, where general anesthesia was induced and found to be adequate. She was placed in dorsolithotomy position in yellowfin stirrups. Her hips were noted not to be hyperflexed or hyperextended. Her arms were padded with gel pads and tucked to her sides. Her hands were surrounded by foam. A padded strap was placed across her chest with foam between the pad and her skin. She was noted to be appropriately positioned with all pressure points well padded and off tension. A tilt test showed no slippage. She was prepped and draped in the usual sterile fashion.  A sterile Foley catheter was inserted.   0.25% plain  Marcaine  was injected at the umbilicus and an incision was made with a scalpel. A Veress needle was inserted into the incision, CO2 insufflation was started, a low opening pressure was noted, and pneumoperitoneum was obtained. The Veress needle was removed and a 8mm robotic trocar was placed. Entry into the peritoneal cavity was confirmed.  After determining placement for the other ports, Local anesthetic was injected at each site and two 8 mm incisions were made for robotic ports at 10 cm lateral to and at the level of the umbilical port. Two additional 8 mm incisions were made 10 cm lateral to these and 30 degrees down followed by 8 mm robotic ports - the right side for an assistant port. All trocars were placed sequentially under direct visualization of the camera. The patient was placed in Trendelenburg. The sacrum appeared to be free of any adhesive disease.The robot was docked on the patient's left side. Monopolar endoshears were placed in the right arm, a [place]  bipolar grasper was placed in the 2nd arm of the patient's left side, and a Tip up grasper was placed in the 3rd arm on the patient's left side.    With a lucite probe in the vagina, sigmoid colon was noted to be adhesed to the vaginal cuff. With gentle traction and scissors, adhesions were carefully taken down. 45 min of adhesiolysis was performed. Attention was then turned to the sacral promontory. The peritoneum overlying the sacral promontory was tented up, dissected sharply with monopolar scissors and electrosurgery using layer by layer technique. A dense layer of fat was present over the sacrum. The overlying areolar and adipose tissue  were taken down until the anterior longitudinal sacral ligament was identified. The middle sacral artery was cauterized. Perforating vessels began bleeding on the left. These were cauterized to slow the bleeding. After holding pressure, two CV2 Gortex sutures were placed, incorporating the bleeding vessel and  the anterior longitudinal ligament, and tied down. Surgiflo was placed and two neuro patties were placed in the space to hold pressure.   The bladder was backfilled to identify the vesico-vaginal space.  Anterior vaginal dissection was then performed with sharp dissection and electrosurgery to separate the vesicovaginal space to the level of the urethra. The bladder was then drained. The posterior vaginal dissection was then performed with sharp dissection and electrosurgery in order to dissect the rectum away from the posterior vagina.  The peritoneal incision was extended down to the posterior cul-de-sac. This was performed with care to avoid the ureter on the right side and the sigmoid colon and its mesentary on the left side.   Small vessels were cauterized along the way to obtain excellent hemostasis. A Palmer mesh was then inserted into the abdomen after trimming to appropriate size. With the probe in the vagina, the anterior leaf of the Palmer mesh was affixed to the anterior portion of the vagina using a 2-0 stratafix suture in a spiral pattern to distribute the suture evenly across the surface of the anterior mesh leaf. In a similar fashion, the posterior leaf of the Palmer mesh was attached to the posterior surface of the vagina with 2-0 stratafix suture.  The distal end of the mesh was then brought to overlie the sacrum. The correct amount of tension was determined in order to elevate the vagina, but not put the mesh under tension. The distal end of the mesh was then affixed to the anterior longitudinal sacral ligament using the previously placed Gortex sutures. The excess distal mesh was then cut and removed. The peritoneum was reapproximated over the mesh using 2-0 monocryl. The bladder flap was incorporated to completely retroperitonealize the mesh. All areas were carefully inspected and noted to be hemostatic as the CO2 gas was deflated. All instruments were removed from the patient's abdomen.    The Foley  catheter was removed.  A 70-degree cystoscope was introduced, and 360-degree inspection revealed no injury, lesion or foreign body in the bladder. Brisk bilateral ureteral efflux was noted with the assistance of pyridium .  The bladder was drained and the cystoscope was removed.  The Foley catheter was replaced.   The robot was undocked. The CO2 gas was removed and the ports were removed.  The skin incisions were closed with subcutaneous stitches of 4-0 Monocryl and covered with skin glue.   Attention was then turned to the perineum. Two allis clamps were placed at the introitus. The perineum was injected with 1% lidocaine  with epinephrine . A diamond shaped incision was made over the perineum and excess skin was removed. Dissection was performed with Metzenbaum scissors to separate the mucosa from the underlying tissue. The perineal body was then reapproximated with two interrupted 0-vicryl sutures. The perineal skin was then closed with a 2-0 vicryl in a subcutaneous and subcuticular fashion. Irrigation was performed and good hemostasis was noted. The patient tolerated the procedure well. She was awakened from anesthesia and transferred to the recovery room in stable condition. Sponge, lap, and needle counts were correct x 2.   Jordan LOISE Caper, MD

## 2024-01-08 NOTE — Anesthesia Procedure Notes (Signed)
 Procedure Name: Intubation Date/Time: 01/08/2024 7:38 AM  Performed by: Jolynn Mage, CRNAPre-anesthesia Checklist: Patient identified, Patient being monitored, Timeout performed, Emergency Drugs available and Suction available Patient Re-evaluated:Patient Re-evaluated prior to induction Oxygen Delivery Method: Circle System Utilized Preoxygenation: Pre-oxygenation with 100% oxygen Induction Type: IV induction Ventilation: Oral airway inserted - appropriate to patient size and Two handed mask ventilation required Laryngoscope Size: Miller and 2 Grade View: Grade II Tube type: Oral Tube size: 7.0 mm Number of attempts: 2 Airway Equipment and Method: Stylet Placement Confirmation: ETT inserted through vocal cords under direct vision, positive ETCO2 and breath sounds checked- equal and bilateral Secured at: 21 cm Tube secured with: Tape Dental Injury: Teeth and Oropharynx as per pre-operative assessment  Difficulty Due To: Difficult Airway- due to limited oral opening, Difficult Airway- due to large tongue and Difficult Airway- due to anterior larynx

## 2024-01-08 NOTE — Anesthesia Postprocedure Evaluation (Signed)
 Anesthesia Post Note  Patient: Jordan Palmer  Procedure(s) Performed: SACROCOLPOPEXY, ROBOT-ASSISTED, LAPAROSCOPIC PERINEORRHAPHY CYSTOSCOPY EXAM UNDER ANESTHESIA, PELVIC LYSIS, ADHESIONS, ROBOT-ASSISTED, LAPAROSCOPIC     Patient location during evaluation: PACU Anesthesia Type: General Level of consciousness: awake and alert Pain management: pain level controlled Vital Signs Assessment: post-procedure vital signs reviewed and stable Respiratory status: spontaneous breathing, nonlabored ventilation and respiratory function stable Cardiovascular status: blood pressure returned to baseline Postop Assessment: no apparent nausea or vomiting Anesthetic complications: no   No notable events documented.  Last Vitals:  Vitals:   01/08/24 1230 01/08/24 1245  BP: 121/72 119/77  Pulse: 67 68  Resp: 14 18  Temp:  (!) 36.4 C  SpO2: 97% 98%                Vertell Row

## 2024-01-08 NOTE — Transfer of Care (Signed)
 Immediate Anesthesia Transfer of Care Note  Patient: Jordan Palmer Feeling  Procedure(s) Performed: SACROCOLPOPEXY, ROBOT-ASSISTED, LAPAROSCOPIC PERINEORRHAPHY CYSTOSCOPY EXAM UNDER ANESTHESIA, PELVIC LYSIS, ADHESIONS, ROBOT-ASSISTED, LAPAROSCOPIC  Patient Location: PACU  Anesthesia Type:General  Level of Consciousness: awake and drowsy  Airway & Oxygen Therapy: Patient Spontanous Breathing and Patient connected to face mask oxygen  Post-op Assessment: Report given to RN and Post -op Vital signs reviewed and stable  Post vital signs: Reviewed and stable  Last Vitals:  Vitals Value Taken Time  BP    Temp    Pulse    Resp    SpO2      Last Pain:  Vitals:   01/08/24 0633  TempSrc: Oral  PainSc: 0-No pain      Patients Stated Pain Goal: 3 (01/08/24 9366)  Complications: No notable events documented.

## 2024-01-08 NOTE — Discharge Instructions (Addendum)
 POST OPERATIVE INSTRUCTIONS  General Instructions Recovery (not bed rest) will last approximately 6 weeks Walking is encouraged, but refrain from strenuous exercise/ housework/ heavy lifting. No lifting >10lbs  Nothing in the vagina- NO intercourse, tampons or douching Bathing:  Do not submerge in water (NO swimming, bath, hot tub, etc) until after your postop visit. You can shower starting the day after surgery.  No driving until you are not taking narcotic pain medicine and until your pain is well enough controlled that you can slam on the breaks or make sudden movements if needed.   Taking your medications Please take your acetaminophen  and ibuprofen  on a schedule for the first 48 hours. Take 600mg  ibuprofen , then take 500mg  acetaminophen  3 hours later, then continue to alternate ibuprofen  and acetaminophen . That way you are taking each type of medication every 6 hours. Take the prescribed narcotic (oxycodone , tramadol , etc) as needed, with a maximum being every 4 hours.  Take a stool softener daily to keep your stools soft and preventing you from straining. If you have diarrhea, you decrease your stool softener. This is explained more below. We have prescribed you Miralax .  Reasons to Call the Nurse (see last page for phone numbers) Heavy Bleeding (changing your pad every 1-2 hours) Persistent nausea/vomiting Fever (100.4 degrees or more) Incision problems (pus or other fluid coming out, redness, warmth, increased pain)  Things to Expect After Surgery Mild to Moderate pain is normal during the first day or two after surgery. If prescribed, take Ibuprofen  or Tylenol  first and use the stronger medicine for "break-through" pain. You can overlap these medicines because they work differently.   Constipation   To Prevent Constipation:  Eat a well-balanced diet including protein, grains, fresh fruit and vegetables.  Drink plenty of fluids. Walk regularly.  Depending on specific instructions  from your physician: take Miralax  daily and additionally you can add a stool softener (colace/ docusate) and fiber supplement. Continue as long as you're on pain medications.   To Treat Constipation:  If you do not have a bowel movement in 2 days after surgery, you can take 2 Tbs of Milk of Magnesia 1-2 times a day until you have a bowel movement. If diarrhea occurs, decrease the amount or stop the laxative. If no results with Milk of Magnesia, you can drink a bottle of magnesium  citrate which you can purchase over the counter.  Fatigue:  This is a normal response to surgery and will improve with time.  Plan frequent rest periods throughout the day.  Gas Pain:  This is very common but can also be very painful! Drink warm liquids such as herbal teas, bouillon or soup. Walking will help you pass more gas.  Mylicon or Gas-X can be taken over the counter.  Leaking Urine:  Varying amounts of leakage may occur after surgery.  This should improve with time. Your bladder needs at least 3 months to recover from surgery. If you leak after surgery, be sure to mention this to your doctor at your post-op visit. If you were taking medications for overactive bladder prior to surgery, be sure to restart the medications immediately after surgery.  Incisions: If you have incisions on your abdomen, the skin glue will dissolve on its own over time. It is ok to gently rinse with soap and water over these incisions but do not scrub.  Catheter Approximately 50% of patients are unable to urinate after surgery and need to go home with a catheter. This allows your bladder to  rest so it can return to full function. If you go home with a catheter, the office will call to set up a voiding trial a few days after surgery. For most patients, by this visit, they are able to urinate on their own. Long term catheter use is rare.   Return to Work  As work demands and recovery times vary widely, it is hard to predict when you will want  to return to work. If you have a desk job with no strenuous physical activity, and if you would like to return sooner than generally recommended, discuss this with your provider or call our office.   Post op concerns  For non-emergent issues, please call the Urogynecology Nurse. Please leave a message and someone will contact you within one business day.  You can also send a message through MyChart.   AFTER HOURS (After 5:00 PM and on weekends):  For urgent matters that cannot wait until the next business day. Call our office [phone] and connect to the doctor on call.  Please reserve this for important issues.   **FOR ANY TRUE EMERGENCY ISSUES CALL 911 OR GO TO THE NEAREST EMERGENCY ROOM.** Please inform our office or the doctor on call of any emergency.     APPOINTMENTS: Call [phone]   Post Anesthesia Home Care Instructions  Activity: Get plenty of rest for the remainder of the day. A responsible individual must stay with you for 24 hours following the procedure.  For the next 24 hours, DO NOT: -Drive a car -Advertising copywriter -Drink alcoholic beverages -Take any medication unless instructed by your physician -Make any legal decisions or sign important papers.  Meals: Start with liquid foods such as gelatin or soup. Progress to regular foods as tolerated. Avoid greasy, spicy, heavy foods. If nausea and/or vomiting occur, drink only clear liquids until the nausea and/or vomiting subsides. Call your physician if vomiting continues.  Special Instructions/Symptoms: Your throat may feel dry or sore from the anesthesia or the breathing tube placed in your throat during surgery. If this causes discomfort, gargle with warm salt water. The discomfort should disappear within 24 hours.  If you had a scopolamine patch placed behind your ear for the management of post- operative nausea and/or vomiting:  1. The medication in the patch is effective for 72 hours, after which it should be  removed.  Wrap patch in a tissue and discard in the trash. Wash hands thoroughly with soap and water. 2. You may remove the patch earlier than 72 hours if you experience unpleasant side effects which may include dry mouth, dizziness or visual disturbances. 3. Avoid touching the patch. Wash your hands with soap and water after contact with the patch.    No acetaminophen /Tylenol  until after 1236 pm today if needed.

## 2024-01-08 NOTE — Interval H&P Note (Signed)
 History and Physical Interval Note:  01/08/2024 7:10 AM  Jordan Palmer  has presented today for surgery, with the diagnosis of vaginal vault prolapse after hysterectomy Incontinence.  The various methods of treatment have been discussed with the patient and family. After consideration of risks, benefits and other options for treatment, the patient has consented to  Procedure(s) with comments: SACROCOLPOPEXY, ROBOT-ASSISTED, LAPAROSCOPIC (N/A) - Robotic assisted sacrocolpopexy, cystoscopy, perineorrhaphy PERINEOPLASTY (N/A) CYSTOSCOPY (N/A) EXAM UNDER ANESTHESIA, PELVIC as a surgical intervention.  The patient's history has been reviewed, patient examined, no change in status, stable for surgery.  I have reviewed the patient's chart and labs.  Questions were answered to the patient's satisfaction.     Jordan Palmer

## 2024-01-09 ENCOUNTER — Telehealth: Payer: Self-pay | Admitting: Obstetrics and Gynecology

## 2024-01-09 ENCOUNTER — Encounter (HOSPITAL_COMMUNITY): Payer: Self-pay | Admitting: Obstetrics and Gynecology

## 2024-01-09 NOTE — Telephone Encounter (Signed)
 Left message to return call for post op questions.

## 2024-01-09 NOTE — Telephone Encounter (Signed)
 Jordan Palmer Feeling underwent Robotic assisted lysis of adhesions, sacrocolpopexy [name]), cystoscopy, perineorrhaphy on 01/08/24.   She passed her voiding trial.  was backfilled into the bladder Voided  PVR by bladder scan was .   She was discharged without a catheter. Please call her for a routine post op check. Thanks!  Rosaline LOISE Caper, MD

## 2024-01-11 NOTE — Telephone Encounter (Signed)
 Jordan Palmer  underwent Sacrocolpopexy, Robot-assisted, Laparoscopic, Perineorrhaphy, Cystoscopy, Exam Under Anesthesia, Pelvic, and Lysis, Adhesions, Robot-assisted, Laparoscopic  on 01/08/2024  with [x] Dr Marilynne [] Dr Guadlupe.  The patient reports that her pain is controlled.  She is taking [] No Medication [x] Acetaminophen  500mg  every 6 hours [x] Ibuprofen  600mg  every 6 hours or []  Prescribed Narcotic.  Her pain level is 3[x] with medication [] Without medication is.   She denies vaginal bleeding.  The patient is tolerating PO fluids and solids. She has not had a bowel movement and is taking Miralax  for a bowel regimen. She is not not passing gas.  She was discharged without a catheter.   []  Discharged without a catheter, the patient does feel as if she is emptying her bladder.  She does not having any additional questions.  Reviewed Post operative instructions as needed to answer additional questions. []   CC'd note to patient's provider.

## 2024-02-19 ENCOUNTER — Encounter: Payer: Self-pay | Admitting: Obstetrics and Gynecology

## 2024-02-19 ENCOUNTER — Ambulatory Visit: Admitting: Obstetrics and Gynecology

## 2024-02-19 VITALS — BP 106/71 | HR 56

## 2024-02-19 DIAGNOSIS — N3941 Urge incontinence: Secondary | ICD-10-CM

## 2024-02-19 DIAGNOSIS — Z48816 Encounter for surgical aftercare following surgery on the genitourinary system: Secondary | ICD-10-CM

## 2024-02-19 MED ORDER — TROSPIUM CHLORIDE ER 60 MG PO CP24: 1.0000 | ORAL_CAPSULE | Freq: Every day | ORAL | 5 refills | Status: AC

## 2024-02-19 NOTE — Patient Instructions (Signed)
 Ok to resume regular activity but avoid intercourse until after next visit.   Use estrogen cream every other day- place a pea sized amount at the vaginal opening.   Prescribed tropsium 60mg  ER daily for overactive bladder. Take daily. Call if your prescription is too expensive.

## 2024-02-19 NOTE — Progress Notes (Signed)
 [facility]  Date of Visit: 02/19/2024  History of Present Illness: Ms. Plantz is a 74 y.o. female scheduled today for a post-operative visit.   Surgery: s/p Robotic assisted lysis of adhesions, sacrocolpopexy [name]), cystoscopy, perineorrhaphy on 01/08/24  She passed her postoperative void trial.   Postoperative course has been uncomplicated.   Today she reports she is doing well. She used the vaginal estrogen cream for about 2 months then stopped. Denies vaginal bleeding.   UTI in the last 6 weeks? No  Pain? No  She has returned to her normal activity (except for postop restrictions) Vaginal bulge? No  Stress incontinence: No  Urgency/frequency: No  Urge incontinence: Yes  Voiding dysfunction: No  Bowel issues: No   Subjective Success: Do you usually have a bulge or something falling out that you can see or feel in the vaginal area? No  Retreatment Success: Any retreatment with surgery or pessary for any compartment? No    Medications: She has a current medication list which includes the following prescription(s): acetaminophen , cyanocobalamin, diphenhydramine , donepezil, ibuprofen , levothyroxine , lisinopril -hydrochlorothiazide , memantine, rosuvastatin , trospium  chloride, vitamin b-12, estradiol , and estradiol .   Allergies: Patient is allergic to codeine and doxycycline.   Physical Exam: BP 106/71   Pulse (!) 56   Abdomen: soft, non-tender, without masses or organomegaly Laparoscopic Incisions: healing well.  Pelvic Examination: Perineum incision slightly separated. Vagina: well healed, No tenderness along the anterior or posterior vagina. No apical tenderness. No pelvic masses. No visible or palpable mesh.  POP-Q: POP-Q  -3                                            Aa   -3                                           Ba  -10                                              C   3                                            Gh  5                                             Pb  10                                            tvl   -2                                            Ap  -2  Bp                                                 D    ---------------------------------------------------------  Assessment and Plan:  1. Urge incontinence    - Restart estrace  cream every other day- place on the perineum.  - Can resume regular activity including exercise.  Discussed avoidance of heavy lifting and straining long term to reduce the risk of recurrence. Avoid intercourse until after next visit.  - For OAB symptoms, will start trospium  60mg  ER daily.   Return in about 1 month (around 03/21/2024).  Rosaline LOISE Caper, MD

## 2024-03-28 ENCOUNTER — Encounter: Admitting: Obstetrics and Gynecology

## 2024-04-23 ENCOUNTER — Ambulatory Visit: Admitting: Obstetrics and Gynecology

## 2024-09-10 ENCOUNTER — Ambulatory Visit: Admitting: Dermatology
